# Patient Record
Sex: Female | Born: 1935 | Race: Black or African American | Hispanic: No | Marital: Married | Smoking: Former smoker
Health system: Southern US, Community
[De-identification: ages and names within clinical notes are randomized; demographics above are authoritative.]

## PROBLEM LIST (undated history)

## (undated) DIAGNOSIS — T7840XA Allergy, unspecified, initial encounter: Secondary | ICD-10-CM

## (undated) DIAGNOSIS — E669 Obesity, unspecified: Secondary | ICD-10-CM

## (undated) DIAGNOSIS — M1990 Unspecified osteoarthritis, unspecified site: Secondary | ICD-10-CM

## (undated) DIAGNOSIS — I350 Nonrheumatic aortic (valve) stenosis: Secondary | ICD-10-CM

## (undated) DIAGNOSIS — I10 Essential (primary) hypertension: Secondary | ICD-10-CM

## (undated) DIAGNOSIS — I639 Cerebral infarction, unspecified: Secondary | ICD-10-CM

## (undated) DIAGNOSIS — D649 Anemia, unspecified: Secondary | ICD-10-CM

## (undated) DIAGNOSIS — N189 Chronic kidney disease, unspecified: Secondary | ICD-10-CM

## (undated) DIAGNOSIS — I050 Rheumatic mitral stenosis: Secondary | ICD-10-CM

## (undated) DIAGNOSIS — F419 Anxiety disorder, unspecified: Secondary | ICD-10-CM

## (undated) DIAGNOSIS — I351 Nonrheumatic aortic (valve) insufficiency: Secondary | ICD-10-CM

## (undated) DIAGNOSIS — F0150 Vascular dementia without behavioral disturbance: Secondary | ICD-10-CM

## (undated) DIAGNOSIS — I5189 Other ill-defined heart diseases: Secondary | ICD-10-CM

## (undated) DIAGNOSIS — G4700 Insomnia, unspecified: Secondary | ICD-10-CM

## (undated) HISTORY — PX: ABDOMINAL HYSTERECTOMY: SHX81

## (undated) HISTORY — DX: Other ill-defined heart diseases: I51.89

## (undated) HISTORY — PX: CATARACT EXTRACTION, BILATERAL: SHX1313

## (undated) HISTORY — DX: Nonrheumatic aortic (valve) stenosis: I35.0

## (undated) HISTORY — DX: Insomnia, unspecified: G47.00

## (undated) HISTORY — DX: Nonrheumatic aortic (valve) insufficiency: I35.1

## (undated) HISTORY — DX: Obesity, unspecified: E66.9

## (undated) HISTORY — PX: APPENDECTOMY: SHX54

## (undated) HISTORY — DX: Anemia, unspecified: D64.9

## (undated) HISTORY — PX: EYE SURGERY: SHX253

## (undated) HISTORY — DX: Unspecified osteoarthritis, unspecified site: M19.90

## (undated) HISTORY — DX: Essential (primary) hypertension: I10

## (undated) HISTORY — DX: Anxiety disorder, unspecified: F41.9

## (undated) HISTORY — DX: Allergy, unspecified, initial encounter: T78.40XA

## (undated) HISTORY — DX: Chronic kidney disease, unspecified: N18.9

## (undated) HISTORY — DX: Rheumatic mitral stenosis: I05.0

---

## 1998-12-29 ENCOUNTER — Other Ambulatory Visit: Admission: RE | Admit: 1998-12-29 | Discharge: 1998-12-29 | Payer: Self-pay | Admitting: *Deleted

## 2000-02-02 ENCOUNTER — Encounter: Admission: RE | Admit: 2000-02-02 | Discharge: 2000-02-02 | Payer: Self-pay | Admitting: *Deleted

## 2000-02-02 ENCOUNTER — Encounter: Payer: Self-pay | Admitting: *Deleted

## 2001-02-10 ENCOUNTER — Encounter: Payer: Self-pay | Admitting: Internal Medicine

## 2001-02-10 ENCOUNTER — Encounter: Admission: RE | Admit: 2001-02-10 | Discharge: 2001-02-10 | Payer: Self-pay | Admitting: Internal Medicine

## 2002-02-13 ENCOUNTER — Encounter: Payer: Self-pay | Admitting: Internal Medicine

## 2002-02-13 ENCOUNTER — Encounter: Admission: RE | Admit: 2002-02-13 | Discharge: 2002-02-13 | Payer: Self-pay | Admitting: Internal Medicine

## 2003-02-12 ENCOUNTER — Encounter: Admission: RE | Admit: 2003-02-12 | Discharge: 2003-02-12 | Payer: Self-pay | Admitting: Internal Medicine

## 2003-02-12 ENCOUNTER — Encounter: Payer: Self-pay | Admitting: Internal Medicine

## 2004-02-15 ENCOUNTER — Encounter: Admission: RE | Admit: 2004-02-15 | Discharge: 2004-02-15 | Payer: Self-pay | Admitting: Internal Medicine

## 2004-02-23 ENCOUNTER — Other Ambulatory Visit: Admission: RE | Admit: 2004-02-23 | Discharge: 2004-02-23 | Payer: Self-pay | Admitting: Internal Medicine

## 2005-02-19 ENCOUNTER — Encounter: Admission: RE | Admit: 2005-02-19 | Discharge: 2005-02-19 | Payer: Self-pay | Admitting: Internal Medicine

## 2006-02-27 ENCOUNTER — Encounter: Admission: RE | Admit: 2006-02-27 | Discharge: 2006-02-27 | Payer: Self-pay | Admitting: Internal Medicine

## 2007-03-03 ENCOUNTER — Encounter: Admission: RE | Admit: 2007-03-03 | Discharge: 2007-03-03 | Payer: Self-pay | Admitting: Internal Medicine

## 2007-08-18 ENCOUNTER — Ambulatory Visit: Payer: Self-pay | Admitting: Surgery

## 2008-03-03 ENCOUNTER — Encounter: Admission: RE | Admit: 2008-03-03 | Discharge: 2008-03-03 | Payer: Self-pay | Admitting: Internal Medicine

## 2009-03-04 ENCOUNTER — Encounter: Admission: RE | Admit: 2009-03-04 | Discharge: 2009-03-04 | Payer: Self-pay | Admitting: Internal Medicine

## 2010-03-06 ENCOUNTER — Encounter: Admission: RE | Admit: 2010-03-06 | Discharge: 2010-03-06 | Payer: Self-pay | Admitting: Internal Medicine

## 2011-02-02 ENCOUNTER — Other Ambulatory Visit: Payer: Self-pay | Admitting: Internal Medicine

## 2011-02-02 DIAGNOSIS — Z1239 Encounter for other screening for malignant neoplasm of breast: Secondary | ICD-10-CM

## 2011-03-09 ENCOUNTER — Ambulatory Visit
Admission: RE | Admit: 2011-03-09 | Discharge: 2011-03-09 | Disposition: A | Discharge status: Home / Self Care | Payer: Medicare Other | Source: Ambulatory Visit | Attending: Internal Medicine | Admitting: Internal Medicine

## 2011-03-09 DIAGNOSIS — Z1239 Encounter for other screening for malignant neoplasm of breast: Secondary | ICD-10-CM

## 2011-04-17 NOTE — Procedures (Signed)
DUPLEX DEEP VENOUS EXAM - LOWER EXTREMITY   INDICATION:  Left calf pain   HISTORY:  Edema:  No  Trauma/Surgery:  No  Pain:  Yes  PE:  No  Previous DVT:  No  Anticoagulants:  Other:   DUPLEX EXAM:                CFV   SFV   PopV  PTV    GSV                R  L  R  L  R  L  R   L  R  L  Thrombosis    0  0     0     0      0     0  Spontaneous   +  +     +     +      +     +  Phasic        +  +     +     +      +     +  Augmentation  +  +     +     +      +     +  Compressible  +  +     +     +      +     +  Competent     +  +     +  +  +      +     +   Legend:  + - yes  o - no  p - partial  D - decreased   IMPRESSION:  No evidence of deep venous thrombosis noted in the left  leg.   _____________________________  Janetta Hora. Fields, MD   MG/MEDQ  D:  08/18/2007  T:  08/19/2007  Job:  [id number]

## 2012-03-25 ENCOUNTER — Other Ambulatory Visit: Payer: Self-pay | Admitting: Internal Medicine

## 2012-03-25 DIAGNOSIS — Z1231 Encounter for screening mammogram for malignant neoplasm of breast: Secondary | ICD-10-CM

## 2012-04-03 ENCOUNTER — Ambulatory Visit
Admission: RE | Admit: 2012-04-03 | Discharge: 2012-04-03 | Disposition: A | Discharge status: Home / Self Care | Payer: BC Managed Care – PPO | Source: Ambulatory Visit | Attending: Internal Medicine | Admitting: Internal Medicine

## 2012-04-03 DIAGNOSIS — Z1231 Encounter for screening mammogram for malignant neoplasm of breast: Secondary | ICD-10-CM

## 2012-04-27 ENCOUNTER — Ambulatory Visit: Payer: Medicare Other

## 2012-04-27 ENCOUNTER — Ambulatory Visit (INDEPENDENT_AMBULATORY_CARE_PROVIDER_SITE_OTHER): Payer: Medicare Other | Admitting: Family Medicine

## 2012-04-27 VITALS — BP 129/62 | HR 80 | Temp 98.0°F | Resp 18 | Ht 62.0 in | Wt 175.0 lb

## 2012-04-27 DIAGNOSIS — R609 Edema, unspecified: Secondary | ICD-10-CM

## 2012-04-27 DIAGNOSIS — R0602 Shortness of breath: Secondary | ICD-10-CM

## 2012-04-27 DIAGNOSIS — M069 Rheumatoid arthritis, unspecified: Secondary | ICD-10-CM

## 2012-04-27 DIAGNOSIS — D649 Anemia, unspecified: Secondary | ICD-10-CM

## 2012-04-27 DIAGNOSIS — I509 Heart failure, unspecified: Secondary | ICD-10-CM

## 2012-04-27 LAB — POCT CBC
HCT, POC: 32.3 % — AB (ref 37.7–47.9)
Lymph, poc: 2.2 (ref 0.6–3.4)
MCH, POC: 30.5 pg (ref 27–31.2)
MCV: 96.7 fL (ref 80–97)
MID (cbc): 0.6 (ref 0–0.9)
MPV: 7.9 fL (ref 0–99.8)
RBC: 3.34 M/uL — AB (ref 4.04–5.48)
WBC: 10 10*3/uL (ref 4.6–10.2)

## 2012-04-27 MED ORDER — FUROSEMIDE 40 MG PO TABS: 40.0000 mg | ORAL_TABLET | Freq: Every day | ORAL | Status: DC

## 2012-04-27 MED ORDER — AMOXICILLIN 875 MG PO TABS: 875.0000 mg | ORAL_TABLET | Freq: Two times a day (BID) | ORAL | Status: AC

## 2012-04-27 NOTE — Progress Notes (Signed)
  Subjective: 78 year old lady with a number of health problems on a long list of medications which are available here in the chart. I reviewed those with her. she has been swelling more for over a week. Yesterday she started being more short of breath, as she was today. She has trouble with shortness of breath when she lays down. No chest pain. Does cough some. She lives with her husband and neck and one daughter. She has arthritis.  No fever. Denies chest pain. GI unremarkable GU unremarkable musculoskeletal has the arthritis pains dermatologic unremarkable except for swelling.  Objective: No obvious JVD. Throat clear. TMs normal. Chest had a few crackles deepen the right base. Heart regular without murmur. She has 3+ pitting edema of her ankles and on to her shins she is fully alert and oriented. O2 sat was 91. She is fully alert and oriented. She is accompanied by her daughter.    Results for orders placed in visit on 04/27/12  POCT CBC      Component Value Range   WBC 10.0  4.6 - 10.2 (K/uL)   Lymph, poc 2.2  0.6 - 3.4    POC LYMPH PERCENT 21.6  10 - 50 (%L)   MID (cbc) 0.6  0 - 0.9    POC MID % 5.6  0 - 12 (%M)   POC Granulocyte 7.3 (*) 2 - 6.9    Granulocyte percent 72.8  37 - 80 (%G)   RBC 3.34 (*) 4.04 - 5.48 (M/uL)   Hemoglobin 10.2 (*) 12.2 - 16.2 (g/dL)   HCT, POC 16.1 (*) 09.6 - 47.9 (%)   MCV 96.7  80 - 97 (fL)   MCH, POC 30.5  27 - 31.2 (pg)   MCHC 31.6 (*) 31.8 - 35.4 (g/dL)   RDW, POC 04.5     Platelet Count, POC 268  142 - 424 (K/uL)   MPV 7.9  0 - 99.8 (fL)   [facility] reading (PRIMARY) by  Dr. Alwyn Ren Cardiomegaly and chf  asssessment Chf Cardiomegaly Rheumatoid Anemia Edema    It is my impression that she has CHF, possibly related to her anemia. She seems to be very stable however at this time. I a forgot to do an EKG while she was here. She was Lasix and referred to see a cardiologist this week. She is to go to emergency room if worse. She is to avoid  salt.

## 2012-04-27 NOTE — Patient Instructions (Signed)
Discontinue hydrochlorothiazide Begin Lasix 40 mg daily Avoid excessive salt We will try to make you an appointment with a cardiologist sometime this week. If you do not hear from our office about a referral by Wednesday I would like you to call out here. If more shortness of breath, more swelling, or more ill at any time good to the emergency room Return to see your primary doctor in 1 week.

## 2012-04-29 ENCOUNTER — Telehealth: Payer: Self-pay

## 2012-04-29 LAB — IBC PANEL
%SAT: 12 % — ABNORMAL LOW (ref 20–55)
TIBC: 316 ug/dL (ref 250–470)

## 2012-04-29 LAB — COMPREHENSIVE METABOLIC PANEL
Albumin: 4.1 g/dL (ref 3.5–5.2)
Chloride: 105 mEq/L (ref 96–112)
Sodium: 139 mEq/L (ref 135–145)

## 2012-04-29 LAB — IRON: Iron: 39 ug/dL — ABNORMAL LOW (ref 42–145)

## 2012-04-29 NOTE — Telephone Encounter (Signed)
Kaitey from [facility] office is calling to request records from this pts recent OV and any labs done if any.Odis Luster states that pt is in the office now. ** Informed her about the release of information**

## 2012-04-29 NOTE — Telephone Encounter (Signed)
Faxed over records per patient's request to Dr Venita Sheffield Office at fax#; [phone]

## 2012-04-30 ENCOUNTER — Encounter: Payer: Self-pay | Admitting: Family Medicine

## 2012-05-12 ENCOUNTER — Telehealth: Payer: Self-pay

## 2012-05-12 NOTE — Telephone Encounter (Signed)
Pt needs to go to [facility] to see dr Eula Listen instead of going to [facility] according to [facility] she is a patient of Dr Jonathon Bellows we could get a new order put in please

## 2012-05-16 ENCOUNTER — Other Ambulatory Visit: Payer: Self-pay | Admitting: Family Medicine

## 2012-05-16 ENCOUNTER — Encounter: Payer: Self-pay | Admitting: Family Medicine

## 2012-05-16 NOTE — Telephone Encounter (Signed)
I am uncertain what this is about.  Did we refer her to GI?  I do not see a record of it.

## 2012-06-23 ENCOUNTER — Encounter: Payer: Self-pay | Admitting: Family Medicine

## 2012-08-16 ENCOUNTER — Other Ambulatory Visit: Payer: Self-pay | Admitting: Family Medicine

## 2012-11-07 ENCOUNTER — Other Ambulatory Visit: Payer: Self-pay | Admitting: Physician Assistant

## 2012-11-27 ENCOUNTER — Other Ambulatory Visit: Payer: Self-pay | Admitting: Physician Assistant

## 2013-05-12 ENCOUNTER — Other Ambulatory Visit: Payer: Self-pay

## 2013-05-12 DIAGNOSIS — Z1231 Encounter for screening mammogram for malignant neoplasm of breast: Secondary | ICD-10-CM

## 2013-06-08 ENCOUNTER — Ambulatory Visit
Admission: RE | Admit: 2013-06-08 | Discharge: 2013-06-08 | Disposition: A | Discharge status: Home / Self Care | Payer: Medicare PPO | Source: Ambulatory Visit

## 2013-06-08 DIAGNOSIS — Z1231 Encounter for screening mammogram for malignant neoplasm of breast: Secondary | ICD-10-CM

## 2013-09-03 ENCOUNTER — Other Ambulatory Visit: Payer: Self-pay | Admitting: Internal Medicine

## 2013-09-03 ENCOUNTER — Ambulatory Visit
Admission: RE | Admit: 2013-09-03 | Discharge: 2013-09-03 | Disposition: A | Discharge status: Home / Self Care | Payer: Medicare PPO | Source: Ambulatory Visit | Attending: Internal Medicine | Admitting: Internal Medicine

## 2013-09-03 DIAGNOSIS — R509 Fever, unspecified: Secondary | ICD-10-CM

## 2013-09-03 DIAGNOSIS — R05 Cough: Secondary | ICD-10-CM

## 2013-09-28 ENCOUNTER — Ambulatory Visit (INDEPENDENT_AMBULATORY_CARE_PROVIDER_SITE_OTHER): Payer: Medicare PPO | Admitting: Family Medicine

## 2013-09-28 ENCOUNTER — Telehealth: Payer: Self-pay | Admitting: Cardiology

## 2013-09-28 ENCOUNTER — Other Ambulatory Visit: Payer: Self-pay | Admitting: Family Medicine

## 2013-09-28 VITALS — BP 130/60 | HR 64 | Temp 99.0°F | Resp 16 | Ht 65.0 in | Wt 180.0 lb

## 2013-09-28 DIAGNOSIS — R05 Cough: Secondary | ICD-10-CM

## 2013-09-28 DIAGNOSIS — D649 Anemia, unspecified: Secondary | ICD-10-CM

## 2013-09-28 DIAGNOSIS — I509 Heart failure, unspecified: Secondary | ICD-10-CM

## 2013-09-28 DIAGNOSIS — R0602 Shortness of breath: Secondary | ICD-10-CM

## 2013-09-28 DIAGNOSIS — R799 Abnormal finding of blood chemistry, unspecified: Secondary | ICD-10-CM

## 2013-09-28 DIAGNOSIS — R059 Cough, unspecified: Secondary | ICD-10-CM

## 2013-09-28 DIAGNOSIS — R7989 Other specified abnormal findings of blood chemistry: Secondary | ICD-10-CM

## 2013-09-28 LAB — POCT CBC
Granulocyte percent: 66.8 %G (ref 37–80)
HCT, POC: 31.7 % — AB (ref 37.7–47.9)
Hemoglobin: 9.8 g/dL — AB (ref 12.2–16.2)
Lymph, poc: 3.2 (ref 0.6–3.4)
MCH, POC: 33.8 pg — AB (ref 27–31.2)
MCHC: 30.9 g/dL — AB (ref 31.8–35.4)
MCV: 109.2 fL — AB (ref 80–97)
MID (cbc): 0.7 (ref 0–0.9)
MPV: 7.6 fL (ref 0–99.8)
POC Granulocyte: 7.9 — AB (ref 2–6.9)
POC LYMPH PERCENT: 27 %L (ref 10–50)
POC MID %: 6.2 %M (ref 0–12)
Platelet Count, POC: 220 10*3/uL (ref 142–424)
RBC: 2.9 M/uL — AB (ref 4.04–5.48)
RDW, POC: 16.8 %
WBC: 11.8 10*3/uL — AB (ref 4.6–10.2)

## 2013-09-28 LAB — COMPREHENSIVE METABOLIC PANEL
ALT: 26 U/L (ref 0–35)
AST: 24 U/L (ref 0–37)
Albumin: 3.8 g/dL (ref 3.5–5.2)
Alkaline Phosphatase: 41 U/L (ref 39–117)
BUN: 12 mg/dL (ref 6–23)
CO2: 32 mEq/L (ref 19–32)
Calcium: 9.4 mg/dL (ref 8.4–10.5)
Chloride: 100 mEq/L (ref 96–112)
Creat: 1.14 mg/dL — ABNORMAL HIGH (ref 0.50–1.10)
Glucose, Bld: 110 mg/dL — ABNORMAL HIGH (ref 70–99)
Potassium: 3.6 mEq/L (ref 3.5–5.3)
Sodium: 142 mEq/L (ref 135–145)
Total Bilirubin: 0.7 mg/dL (ref 0.3–1.2)
Total Protein: 6.8 g/dL (ref 6.0–8.3)

## 2013-09-28 MED ORDER — METOLAZONE 5 MG PO TABS: 5.0000 mg | ORAL_TABLET | Freq: Every day | ORAL | Status: DC

## 2013-09-28 NOTE — Progress Notes (Signed)
Patient ID: KHAYLEE MCEVOY MRN: [id number], DOB: [birth year], 79 y.o. Date of Encounter: 09/28/2013, 11:54 AM  Primary Physician: No primary provider on file.  Chief Complaint:  Chief Complaint  Patient presents with  . Cough    HPI: 79 y.o. year old female presents with a 28 day history of nasal congestion, post nasal drip, sore throat, and cough. Mild sinus pressure. Afebrile. No chills. Nasal congestion thick and green/yellow. Cough is productive of green/yellow sputum and not associated with time of day. Ears feel full, leading to sensation of muffled hearing. Has tried OTC cold preps without success. Coughs to the point of gagging.  Symptoms worse at night.  No appetite.  No sick contacts, recent antibiotics, or recent travels.   No leg trauma, sedentary periods, h/o cancer, or tobacco use.  Patient has h/o CHF as well.  No ankle swelling.  No asthma hx.    Past Medical History  Diagnosis Date  . Allergy   . Anemia   . CHF (congestive heart failure)      Home Meds: Prior to Admission medications   Medication Sig Start Date End Date Taking? Authorizing Provider  ALPRAZolam (XANAX) 0.25 MG tablet Take 0.25 mg by mouth 2 (two) times daily as needed.   Yes Historical Provider, MD  aspirin 81 MG chewable tablet Chew 81 mg by mouth daily.   Yes Historical Provider, MD  calcium gluconate 500 MG tablet Take 500 mg by mouth 2 (two) times daily.   Yes Historical Provider, MD  cholecalciferol (VITAMIN D) 1000 UNITS tablet Take 1,000 Units by mouth daily.   Yes Historical Provider, MD  ferrous sulfate 325 (65 FE) MG tablet Take 325 mg by mouth daily with breakfast.   Yes Historical Provider, MD  folic acid (FOLVITE) 1 MG tablet Take 1 mg by mouth daily.   Yes Historical Provider, MD  mometasone-formoterol (DULERA) 100-5 MCG/ACT AERO Inhale 2 puffs into the lungs.   Yes Historical Provider, MD  moxifloxacin (AVELOX) 400 MG tablet Take 400 mg by mouth daily.   Yes Historical  Provider, MD  predniSONE (DELTASONE) 5 MG tablet Take 5 mg by mouth daily. Takes 3 tablets daily   Yes Historical Provider, MD  traMADol (ULTRAM) 50 MG tablet Take 50 mg by mouth 3 (three) times daily as needed.   Yes Historical Provider, MD  verapamil (COVERA HS) 240 MG (CO) 24 hr tablet Take 240 mg by mouth at bedtime.   Yes Historical Provider, MD  doxazosin (CARDURA) 8 MG tablet Take 8 mg by mouth at bedtime.    Historical Provider, MD  furosemide (LASIX) 40 MG tablet TAKE 1 TABLET BY MOUTH EVERY DAY NEEDS OFFICE VISIT 11/27/12   Anders Simmonds, PA-C  hydrochlorothiazide (HYDRODIURIL) 25 MG tablet Take 25 mg by mouth daily.    Historical Provider, MD  loratadine (CLARITIN) 10 MG tablet Take 10 mg by mouth as needed.    Historical Provider, MD  methotrexate (RHEUMATREX) 2.5 MG tablet Take 2.5 mg by mouth once a week. Caution:Chemotherapy. Protect from light.    Historical Provider, MD  metoprolol tartrate (LOPRESSOR) 25 MG tablet Take 25 mg by mouth 2 (two) times daily.    Historical Provider, MD  Polyethyl Glycol-Propyl Glycol (SYSTANE) 0.4-0.3 % SOLN Apply to eye.    Historical Provider, MD  potassium chloride (K-DUR,KLOR-CON) 10 MEQ tablet Take 10 mEq by mouth daily.    Historical Provider, MD    Allergies: No Known Allergies  History   Social  History  . Marital Status: Married    Spouse Name: N/A    Number of Children: N/A  . Years of Education: N/A   Occupational History  . Not on file.   Social History Main Topics  . Smoking status: Never Smoker   . Smokeless tobacco: Not on file  . Alcohol Use: Not on file  . Drug Use: Not on file  . Sexual Activity: Not on file   Other Topics Concern  . Not on file   Social History Narrative  . No narrative on file     Review of Systems: Constitutional: negative for chills, fever, night sweats or weight changes Cardiovascular: negative for chest pain or palpitations Respiratory: negative for hemoptysis, wheezing, or shortness  of breath Abdominal: negative for abdominal pain, nausea, vomiting or diarrhea Dermatological: negative for rash Neurologic: negative for headache   Physical Exam: Blood pressure 130/60, pulse 64, temperature 99 F (37.2 C), temperature source Oral, resp. rate 16, height 5\' 5"  (1.651 m), weight 180 lb (81.647 kg), SpO2 94.00%., Body mass index is 29.95 kg/(m^2). General: Well developed, well nourished, in no acute distress. Head: Normocephalic, atraumatic, eyes without discharge, sclera non-icteric, nares are congested. Bilateral auditory canals clear, TM's are without perforation, pearly grey with reflective cone of light bilaterally. No sinus TTP. Oral cavity moist, dentition normal. Posterior pharynx with post nasal drip and mild erythema. No peritonsillar abscess or tonsillar exudate. Neck: Supple. No thyromegaly. Full ROM. No lymphadenopathy. Lungs: Coarse breath sounds bilaterally without wheezes, rales, or rhonchi. Breathing is unlabored.  Heart: RRR with S1 S2. No murmurs, rubs, or gallops appreciated. Msk:  Strength and tone normal for age. Extremities: No clubbing or cyanosis. No edema. Neuro: Alert and oriented X 3. Moves all extremities spontaneously. CNII-XII grossly in tact. Psych:  Responds to questions appropriately with a normal affect.    ASSESSMENT AND PLAN:  79 y.o. year old female withCHF CHF (congestive heart failure) - Plan: metolazone (ZAROXOLYN) 5 MG tablet, POCT CBC, Ambulatory referral to Cardiology  Elevated brain natriuretic peptide (BNP) level   - -Mucinex -Tylenol/Motrin prn -Rest/fluids -RTC precautions -RTC 3-5 days if no improvement  Signed, Elvina Sidle, MD 09/28/2013 11:54 AM

## 2013-09-28 NOTE — Telephone Encounter (Signed)
Per daughter - pt has been sick for some time now (approximatley since 10/1)  She has been to see her PCP who Dx her with bronchitis and daughter reports she was started on an antibiotic then.  10/12 pt was Dx with Pneumonia - per daughter she was "yellow" looking and could barely walk she was so weak.  She was not admitted to the hospital.  Saturday night pt became very SOB.  EMS came to the house but pt refused to go to the hospital for further evaluation.  They took her today to [facility].  She was Dx with possible CHF and started on Metolazone.  Lab work was also drawn including BNP however those results are not back yet.  Advised daughter to continue with plan of care by Urgent Care and I will forward this information to Dr Mayford Knife for review.  Daughter aware we will call back with further instructions.

## 2013-09-28 NOTE — Addendum Note (Signed)
Addended by: Johnnette Litter on: 09/28/2013 02:55 PM   Modules accepted: Orders

## 2013-09-28 NOTE — Telephone Encounter (Signed)
Please get results of BNP for my review from Urgent Care

## 2013-09-28 NOTE — Telephone Encounter (Signed)
New Problem:  Pt's daughter states she has some questions and concerns for the doctor or nurse. Pt's daughter also states her mom has been diagnosed with pneumonia and bronchitis since the beginning of October. Pt's daughter states her mom just doesn't look good. Please advise

## 2013-09-29 ENCOUNTER — Other Ambulatory Visit: Payer: Self-pay | Admitting: Family Medicine

## 2013-09-29 DIAGNOSIS — D649 Anemia, unspecified: Secondary | ICD-10-CM

## 2013-09-29 LAB — BRAIN NATRIURETIC PEPTIDE: Brain Natriuretic Peptide: 403.4 pg/mL — ABNORMAL HIGH (ref 0.0–100.0)

## 2013-09-29 LAB — FERRITIN: Ferritin: 305 ng/mL — ABNORMAL HIGH (ref 10–291)

## 2013-09-29 NOTE — Telephone Encounter (Signed)
Follow Up:  Pt's daughter states she is calling back just to follow up and see when someone is going to call her back. Please advise

## 2013-09-29 NOTE — Telephone Encounter (Signed)
Called daughter and explained Dr. Mayford Knife was not yet in office and that once Dr. Mayford Knife reviewed the Urgent care ov I would call her back and advise on what Dr. Mayford Knife would like to do.

## 2013-09-30 ENCOUNTER — Telehealth: Payer: Self-pay

## 2013-09-30 ENCOUNTER — Encounter: Payer: Self-pay | Admitting: *Deleted

## 2013-09-30 DIAGNOSIS — D649 Anemia, unspecified: Secondary | ICD-10-CM | POA: Insufficient documentation

## 2013-09-30 DIAGNOSIS — G4700 Insomnia, unspecified: Secondary | ICD-10-CM | POA: Insufficient documentation

## 2013-09-30 DIAGNOSIS — M1990 Unspecified osteoarthritis, unspecified site: Secondary | ICD-10-CM | POA: Insufficient documentation

## 2013-09-30 DIAGNOSIS — I350 Nonrheumatic aortic (valve) stenosis: Secondary | ICD-10-CM | POA: Insufficient documentation

## 2013-09-30 DIAGNOSIS — I050 Rheumatic mitral stenosis: Secondary | ICD-10-CM | POA: Insufficient documentation

## 2013-09-30 DIAGNOSIS — N189 Chronic kidney disease, unspecified: Secondary | ICD-10-CM | POA: Insufficient documentation

## 2013-09-30 DIAGNOSIS — T7840XA Allergy, unspecified, initial encounter: Secondary | ICD-10-CM | POA: Insufficient documentation

## 2013-09-30 DIAGNOSIS — F419 Anxiety disorder, unspecified: Secondary | ICD-10-CM | POA: Insufficient documentation

## 2013-09-30 DIAGNOSIS — I351 Nonrheumatic aortic (valve) insufficiency: Secondary | ICD-10-CM | POA: Insufficient documentation

## 2013-09-30 DIAGNOSIS — E669 Obesity, unspecified: Secondary | ICD-10-CM | POA: Insufficient documentation

## 2013-09-30 NOTE — Telephone Encounter (Signed)
Pt notified of labs. Pt's cough has not improved at all, but she sees the [employer].

## 2013-09-30 NOTE — Telephone Encounter (Signed)
Message copied by Johnnette Litter on Wed Sep 30, 2013  8:03 PM ------      Message from: Elvina Sidle      Created: Wed Sep 30, 2013 10:23 AM       Patient has abnormal lab values.  Good iron levels, so no need for iron supplementation now.  I have requested a hematology consultation.  The labs do confirm congestive heart failure and I am expecting improvement in cough and shortness of breath by now.  Please let me know ------

## 2013-10-01 ENCOUNTER — Ambulatory Visit (HOSPITAL_COMMUNITY)
Admission: RE | Admit: 2013-10-01 | Discharge: 2013-10-01 | Disposition: A | Discharge status: Home / Self Care | Payer: Medicare PPO | Source: Ambulatory Visit | Attending: Cardiology | Admitting: Cardiology

## 2013-10-01 ENCOUNTER — Encounter: Payer: Self-pay | Admitting: Cardiology

## 2013-10-01 ENCOUNTER — Ambulatory Visit (INDEPENDENT_AMBULATORY_CARE_PROVIDER_SITE_OTHER): Payer: Medicare PPO | Admitting: Cardiology

## 2013-10-01 VITALS — BP 116/50 | HR 70 | Ht 65.0 in | Wt 176.8 lb

## 2013-10-01 DIAGNOSIS — I509 Heart failure, unspecified: Secondary | ICD-10-CM | POA: Insufficient documentation

## 2013-10-01 DIAGNOSIS — R05 Cough: Secondary | ICD-10-CM

## 2013-10-01 DIAGNOSIS — I519 Heart disease, unspecified: Secondary | ICD-10-CM

## 2013-10-01 DIAGNOSIS — I359 Nonrheumatic aortic valve disorder, unspecified: Secondary | ICD-10-CM

## 2013-10-01 DIAGNOSIS — I351 Nonrheumatic aortic (valve) insufficiency: Secondary | ICD-10-CM

## 2013-10-01 DIAGNOSIS — I050 Rheumatic mitral stenosis: Secondary | ICD-10-CM

## 2013-10-01 DIAGNOSIS — R059 Cough, unspecified: Secondary | ICD-10-CM | POA: Insufficient documentation

## 2013-10-01 DIAGNOSIS — I350 Nonrheumatic aortic (valve) stenosis: Secondary | ICD-10-CM

## 2013-10-01 DIAGNOSIS — I5189 Other ill-defined heart diseases: Secondary | ICD-10-CM

## 2013-10-01 DIAGNOSIS — I5032 Chronic diastolic (congestive) heart failure: Secondary | ICD-10-CM

## 2013-10-01 NOTE — Progress Notes (Addendum)
[address] [place], [place]  [address] Phone: [phone] Fax:  [phone]  Date:  10/01/2013   ID:  Monica Blankenship, DOB 12/25/1928, MRN [id number]  PCP:  No primary provider on file.  Cardiologist:  Armanda Magic, MD     History of Present Illness: Monica Blankenship is a 79 y.o. female  with a history of HTN, diastolic dysfunction and mild MS, moderate MR, mild AS and mild to moderate AI and mild pulmonary HTN.  She has not been doing very well.  She has been to see Dr. Donette Larry several times now for acute bronchitis and was treated with antibiotics and then got worse and was diagnosed with PNA on 10/12 and was placed on antibiotics again and then saw Dr. Donette Larry again on 10/22 and was diagnosed with antibiotics again.  She continues to have a cough productive of white mucous.  She denies any chest pain.  She denies any SOB.  She denies any LE edema but her daughter says that she had some LE edema when seen at urgent care and a BNP was mildly elevated and she was started on Metolazone in addition to continuing her Lasix.  Since then her LE edema has resolved.  She has urinated a lot after that.  She says that her cough is worse at night and she coughs up very thick mucous.     Wt Readings from Last 3 Encounters:  10/01/13 176 lb 12.8 oz (80.196 kg)  09/28/13 180 lb (81.647 kg)  04/27/12 175 lb (79.379 kg)     Past Medical History  Diagnosis Date  . Allergy   . CHF (congestive heart failure)   . HTN (hypertension)   . Anxiety   . Insomnia   . Osteoarthritis   . Anemia   . Fatigue   . Obesity   . Diastolic dysfunction   . CKD (chronic kidney disease)   . Mitral stenosis   . Aortic stenosis   . Aortic regurgitation       Allergies:   No Known Allergies  Social History:  The patient  reports that she has never smoked. She does not have any smokeless tobacco history on file.   Family History:  The patient's family history is not on file.   ROS:  Please see  the history of present illness.      All other systems reviewed and negative.   PHYSICAL EXAM: VS:  BP 116/50  Pulse 70  Ht 5\' 5"  (1.651 m)  Wt 176 lb 12.8 oz (80.196 kg)  BMI 29.42 kg/m2 Well nourished, well developed, in no acute distress HEENT: normal Neck: no JVD Cardiac:  normal S1, S2; RRR; no murmur Lungs:  ronchi at bases otherwise clear Abd: soft, nontender, no hepatomegaly Ext: no edema Skin: warm and dry Neuro:  CNs 2-12 intact, no focal abnormalities noted   EKG:  NSR with incomplete LBBB    ASSESSMENT AND PLAN:  1. Cough productive of thick mucous after treatment several times for acute bronchitis and PNA.  She continues to have a chronic cough without fever olr chills that is worse at night bringing up possibility of chronic GERD.  Her cough may be worsened by diastolic CHF but has not resolved on diuretics.  - refer to Pulmonary Medicine  - PA and lat Chest xray today 2. Acute on chronic diastolic CHF   - I will recheck a BNP today to see if it has improved  - stop Metalozone  for now and continue Lasix  - check BMET  - recheck 2D echo to assess LVF  - stop HCTZ At this time her O2 sats on RA are 92% and decrease to 90% by ambulation.  She is not febrile and does not appear ill enough to admit to [facility].  Her main complaint is chronic cough and she is not SOB. 3.  Mild MS with Moderate MR by echo 03/2013 4.  Mild AS with mild to moderate AI by echo 03/2013 5.  HTN   - continue doxazosin/metoprolol/Verapamil  Followup with me on 11/19     Signed, Armanda Magic, MD 10/01/2013 3:37 PM

## 2013-10-01 NOTE — Patient Instructions (Addendum)
Stop Metalazone Stop HCTZ  Your physician recommends that have lab work today after your office visit to check you BMET and BNP level  Your physician has requested that you have an echocardiogram. Echocardiography is a painless test that uses sound waves to create images of your heart. It provides your doctor with information about the size and shape of your heart and how well your heart's chambers and valves are working. This procedure takes approximately one hour. There are no restrictions for this procedure.  A chest x-ray takes a picture of the organs and structures inside the chest, including the heart, lungs, and blood vessels. This test can show several things, including, whether the heart is enlarges; whether fluid is building up in the lungs; and whether pacemaker / defibrillator leads are still in place. STAT   You are scheduled to see Dr Mayford Knife again on 10/21/13

## 2013-10-01 NOTE — Progress Notes (Signed)
Call pts daughter at [phone] to let her know

## 2013-10-02 ENCOUNTER — Telehealth: Payer: Self-pay | Admitting: General Surgery

## 2013-10-02 DIAGNOSIS — R0602 Shortness of breath: Secondary | ICD-10-CM

## 2013-10-02 LAB — CBC
HCT: 36 % (ref 36.0–46.0)
Hemoglobin: 11.9 g/dL — ABNORMAL LOW (ref 12.0–15.0)
RBC: 3.49 Mil/uL — ABNORMAL LOW (ref 3.87–5.11)
RDW: 17 % — ABNORMAL HIGH (ref 11.5–14.6)
WBC: 7.1 10*3/uL (ref 4.5–10.5)

## 2013-10-02 LAB — BASIC METABOLIC PANEL
BUN: 31 mg/dL — ABNORMAL HIGH (ref 6–23)
CO2: 35 mEq/L — ABNORMAL HIGH (ref 19–32)
Chloride: 89 mEq/L — ABNORMAL LOW (ref 96–112)
Potassium: 3.1 mEq/L — ABNORMAL LOW (ref 3.5–5.1)

## 2013-10-02 LAB — BRAIN NATRIURETIC PEPTIDE: Pro B Natriuretic peptide (BNP): 129 pg/mL — ABNORMAL HIGH (ref 0.0–100.0)

## 2013-10-02 NOTE — Telephone Encounter (Signed)
Patient's daughter is returning your call 

## 2013-10-02 NOTE — Telephone Encounter (Signed)
Message copied by Nita Sells on Fri Oct 02, 2013  1:06 PM ------      Message from: Armanda Magic R      Created: Fri Oct 02, 2013 11:06 AM       Please let patient's daughter know that her BNP has significantly improved and chest xray showed no evidence of CHF.  She had rhonchi on exam yesterday that are most likely due to recent bronchitis.  Her kidney function has worsened due to Metolazone.  Please have her hold Lasix for 2 days and then resume Lasix at 20mg  daily.  Please get her an appt to see Dr. Donette Larry today and make sure she has an upcoming appt with Pulmonary.  Her potassium is low.  Please have her take Klor Con 4 tablets now and then restart 1 tablet daily in 2 days when she restarts her Lasix.  Recheck BMET on 11/3 ------

## 2013-10-02 NOTE — Telephone Encounter (Signed)
Dhe was supposed to be referred to Pulmonary at her OV with me on 10/30

## 2013-10-02 NOTE — Telephone Encounter (Signed)
Pt is aware of the med changes. Pt is seeing Dr. Donette Larry on Monday at 3:00. I called and asked that they add on a BMET for [place] so the pt would not have to go to two different offices that day. She does not have a pulmonologist at this time.

## 2013-10-02 NOTE — Telephone Encounter (Signed)
Message copied by Nita Sells on Fri Oct 02, 2013  1:21 PM ------      Message from: Armanda Magic R      Created: Fri Oct 02, 2013 11:06 AM       Please let patient's daughter know that her BNP has significantly improved and chest xray showed no evidence of CHF.  She had rhonchi on exam yesterday that are most likely due to recent bronchitis.  Her kidney function has worsened due to Metolazone.  Please have her hold Lasix for 2 days and then resume Lasix at 20mg  daily.  Please get her an appt to see Dr. Donette Larry today and make sure she has an upcoming appt with Pulmonary.  Her potassium is low.  Please have her take Klor Con 4 tablets now and then restart 1 tablet daily in 2 days when she restarts her Lasix.  Recheck BMET on 11/3 ------

## 2013-10-02 NOTE — Telephone Encounter (Signed)
LVM for pt to return call

## 2013-10-05 NOTE — Telephone Encounter (Signed)
I looked thru you OV and my notes and did not see a referral needed. I will do that this morning for you. Do you have a preference for pt to see?

## 2013-10-05 NOTE — Telephone Encounter (Signed)
No preference

## 2013-10-05 NOTE — Telephone Encounter (Signed)
Referal sent to Pulmonology

## 2013-10-05 NOTE — Addendum Note (Signed)
Addended by: Nita Sells on: 10/05/2013 02:22 PM   Modules accepted: Orders

## 2013-10-06 ENCOUNTER — Telehealth: Payer: Self-pay | Admitting: Critical Care Medicine

## 2013-10-08 ENCOUNTER — Other Ambulatory Visit: Payer: Self-pay

## 2013-10-14 ENCOUNTER — Other Ambulatory Visit: Payer: Self-pay | Admitting: Nurse Practitioner

## 2013-10-14 ENCOUNTER — Ambulatory Visit
Admission: RE | Admit: 2013-10-14 | Discharge: 2013-10-14 | Disposition: A | Discharge status: Home / Self Care | Payer: Medicare PPO | Source: Ambulatory Visit | Attending: Nurse Practitioner | Admitting: Nurse Practitioner

## 2013-10-14 DIAGNOSIS — J209 Acute bronchitis, unspecified: Secondary | ICD-10-CM

## 2013-10-15 ENCOUNTER — Ambulatory Visit (HOSPITAL_COMMUNITY): Payer: Medicare PPO | Attending: Cardiology | Admitting: Radiology

## 2013-10-15 ENCOUNTER — Encounter: Payer: Self-pay | Admitting: Cardiology

## 2013-10-15 DIAGNOSIS — R05 Cough: Secondary | ICD-10-CM | POA: Insufficient documentation

## 2013-10-15 DIAGNOSIS — I079 Rheumatic tricuspid valve disease, unspecified: Secondary | ICD-10-CM | POA: Insufficient documentation

## 2013-10-15 DIAGNOSIS — I080 Rheumatic disorders of both mitral and aortic valves: Secondary | ICD-10-CM | POA: Insufficient documentation

## 2013-10-15 DIAGNOSIS — I10 Essential (primary) hypertension: Secondary | ICD-10-CM | POA: Insufficient documentation

## 2013-10-15 DIAGNOSIS — R059 Cough, unspecified: Secondary | ICD-10-CM | POA: Insufficient documentation

## 2013-10-15 DIAGNOSIS — I5032 Chronic diastolic (congestive) heart failure: Secondary | ICD-10-CM

## 2013-10-15 DIAGNOSIS — I509 Heart failure, unspecified: Secondary | ICD-10-CM | POA: Insufficient documentation

## 2013-10-15 NOTE — Progress Notes (Signed)
Echocardiogram performed.  

## 2013-10-15 NOTE — Telephone Encounter (Signed)
What labs?

## 2013-10-15 NOTE — Telephone Encounter (Signed)
To Dr. Mayford Knife. As soon as labs arrive will give to you.

## 2013-10-15 NOTE — Telephone Encounter (Signed)
New problem    Lab work will be faxing over. Please advise

## 2013-10-16 NOTE — Telephone Encounter (Signed)
Basic Metabolic Panel on your desk for review

## 2013-10-16 NOTE — Telephone Encounter (Signed)
BMET stable

## 2013-10-16 NOTE — Telephone Encounter (Signed)
Called Dr. Levonne Lapping and let her know Dr. Mayford Knife thought the BMET lab was stable and she could call us back if she had any questions.

## 2013-10-21 ENCOUNTER — Ambulatory Visit (INDEPENDENT_AMBULATORY_CARE_PROVIDER_SITE_OTHER): Payer: Medicare PPO | Admitting: Cardiology

## 2013-10-21 ENCOUNTER — Encounter: Payer: Self-pay | Admitting: Cardiology

## 2013-10-21 VITALS — BP 140/80 | HR 79 | Ht 65.0 in | Wt 186.0 lb

## 2013-10-21 DIAGNOSIS — I351 Nonrheumatic aortic (valve) insufficiency: Secondary | ICD-10-CM

## 2013-10-21 DIAGNOSIS — I2789 Other specified pulmonary heart diseases: Secondary | ICD-10-CM

## 2013-10-21 DIAGNOSIS — I359 Nonrheumatic aortic valve disorder, unspecified: Secondary | ICD-10-CM

## 2013-10-21 DIAGNOSIS — I5032 Chronic diastolic (congestive) heart failure: Secondary | ICD-10-CM

## 2013-10-21 DIAGNOSIS — I509 Heart failure, unspecified: Secondary | ICD-10-CM

## 2013-10-21 DIAGNOSIS — I272 Pulmonary hypertension, unspecified: Secondary | ICD-10-CM | POA: Insufficient documentation

## 2013-10-21 DIAGNOSIS — I10 Essential (primary) hypertension: Secondary | ICD-10-CM

## 2013-10-21 DIAGNOSIS — I050 Rheumatic mitral stenosis: Secondary | ICD-10-CM

## 2013-10-21 DIAGNOSIS — I350 Nonrheumatic aortic (valve) stenosis: Secondary | ICD-10-CM

## 2013-10-21 MED ORDER — FUROSEMIDE 40 MG PO TABS: 20.0000 mg | ORAL_TABLET | Freq: Every day | ORAL | Status: DC

## 2013-10-21 MED ORDER — METOPROLOL TARTRATE 25 MG PO TABS: 25.0000 mg | ORAL_TABLET | Freq: Two times a day (BID) | ORAL | Status: DC

## 2013-10-21 NOTE — Progress Notes (Addendum)
[address] [place], [place]  [address] Phone: [phone] Fax:  [phone]  Date:  10/22/2013   ID:  Monica Blankenship, DOB 02-24-1929, MRN [id number]  PCP:  No primary provider on file.  Cardiologist:  Armanda Magic, MD     History of Present Illness: Monica Blankenship is a 79 y.o. female with a history of HTN, diastolic dysfunction and mild MS, moderate MR, mild AS and mild to moderate AI and mild pulmonary HTN. She has not been doing very well. She has been to see Dr. Donette Larry several times now for acute bronchitis and was treated with antibiotics and then got worse and was diagnosed with PNA on 10/12 and was placed on antibiotics again and then saw Dr. Donette Larry again on 10/22 and was diagnosed with antibiotics again. She continued to have a cough productive of white mucous. She denies any chest pain. She denies any SOB. She denies any LE edema but her daughter says that she had some LE edema when seen at urgent care and a BNP was mildly elevated and she was started on Metolazone in addition to continuing her Lasix. Since then her LE edema has resolved. She has urinated a lot after that. She says that her cough is worse at night and she coughs up very thick mucous. When I saw her last I ordered a BNP which was much improved and essentially normal and chest xray showed no edema.  Her creatinine had increased and her Metalozone and HCTZ were stopped.  Repeat echo was unchanged from April except for worsening of her pulmonary HTN.  At that time I felt her symptoms were more related to recent URIs and recommended followup with Dr. Donette Larry.  He placed her on a steroid taper and an inhaler and she is now significantly better.     Wt Readings from Last 3 Encounters:  10/21/13 186 lb (84.369 kg)  10/01/13 176 lb 12.8 oz (80.196 kg)  09/28/13 180 lb (81.647 kg)     Past Medical History  Diagnosis Date  . Allergy   . CHF (congestive heart failure)   . Anxiety   . Insomnia   .  Osteoarthritis   . Anemia   . Fatigue   . Obesity   . Diastolic dysfunction   . CKD (chronic kidney disease)   . Mitral stenosis   . Aortic stenosis   . Aortic regurgitation   . HTN (hypertension)     Current Outpatient Prescriptions  Medication Sig Dispense Refill  . ALPRAZolam (XANAX) 0.25 MG tablet Take 0.25 mg by mouth 2 (two) times daily as needed.      Marland Kitchen aspirin 81 MG chewable tablet Chew 81 mg by mouth daily.      . calcium gluconate 500 MG tablet Take 500 mg by mouth 2 (two) times daily.      . cholecalciferol (VITAMIN D) 1000 UNITS tablet Take 1,000 Units by mouth daily.      Marland Kitchen doxazosin (CARDURA) 8 MG tablet Take 8 mg by mouth at bedtime.      . ferrous sulfate 325 (65 FE) MG tablet Take 325 mg by mouth daily with breakfast.      . folic acid (FOLVITE) 1 MG tablet Take 1 mg by mouth daily.      . furosemide (LASIX) 40 MG tablet TAKE 1 TABLET BY MOUTH EVERY DAY   15 tablet  0  . loratadine (CLARITIN) 10 MG tablet Take 10 mg by mouth as needed.      Marland Kitchen  methotrexate (RHEUMATREX) 2.5 MG tablet Take 2.5 mg by mouth once a week. Caution:Chemotherapy. Protect from light.      . metoprolol tartrate (LOPRESSOR) 25 MG tablet Take 25 mg by mouth 2 (two) times daily.      . mometasone-formoterol (DULERA) 100-5 MCG/ACT AERO Inhale 2 puffs into the lungs.      . moxifloxacin (AVELOX) 400 MG tablet Take 400 mg by mouth daily.      Monica Gala Glycol-Propyl Glycol (SYSTANE) 0.4-0.3 % SOLN Apply to eye.      . potassium chloride (K-DUR,KLOR-CON) 10 MEQ tablet Take 10 mEq by mouth daily.      . predniSONE (DELTASONE) 5 MG tablet Take 7.5 mg by mouth. Take 1.5 tablets      . traMADol (ULTRAM) 50 MG tablet Take 50 mg by mouth 3 (three) times daily as needed.      . verapamil (COVERA HS) 240 MG (CO) 24 hr tablet Take 240 mg by mouth at bedtime.       No current facility-administered medications for this visit.    Allergies:   No Known Allergies  Social History:  The patient  reports that  she has never smoked. She does not have any smokeless tobacco history on file. She reports that she does not drink alcohol or use illicit drugs.   Family History:  The patient's family history is not on file.   ROS:  Please see the history of present illness.      All other systems reviewed and negative.   PHYSICAL EXAM: VS:  BP 140/80  Pulse 79  Ht 5\' 5"  (1.651 m)  Wt 186 lb (84.369 kg)  BMI 30.95 kg/m2 Well nourished, well developed, in no acute distress HEENT: normal Neck: no JVD Cardiac:  normal S1, S2; RRR; no murmur Lungs:  clear to auscultation bilaterally, no wheezing, rhonchi or rales Abd: soft, nontender, no hepatomegaly Ext: 1+ edema Skin: warm and dry Neuro:  CNs 2-12 intact, no focal abnormalities noted  ASSESSMENT AND PLAN:  1.  Cough productive of thick mucous after treatment several times for acute bronchitis and PNA.  Now significantly improved after steroids and inhaler but continues to have a daily cough but not as bad.  She still has SOB with movement. 2.   Acute on chronic diastolic CHF - now resolved although she still has some degree of LE edema which is most likely due to her severe pulmonary HTN  - she never restarted her Lasix  - restart Lasix 40mg  daily 1/2 tablet daily  - continue potassium  - recheck BMET in 1 week 3. Mild MS with Moderate MR by echo 03/2013  4. Mild AS with mild to moderate AI by echo 03/2013  5. HTN   -continue doxazosin/Verapamil  - restart metoprolol 25mg  BID - she stopped this but no one has told her to and her daughters were confused about the medication 6.  Moderate to severe pulmonary HTN  (PASP ) most likely secondary to underlying COPD  Followup with me in 3 months   Signed, Armanda Magic, MD 10/22/2013 10:02 PM

## 2013-10-21 NOTE — Patient Instructions (Signed)
Your physician has recommended you make the following change in your medication: Restart Metoprolol 25 MG Twice a day and Restart Lasix 40 MG 1/2 tablet Daily  Your physician recommends that you return for lab work in: One week for a Basic Metabolic Panel. This will be drawn on Weds the 26th. Our lab opens at 8:00 AM.  Your physician recommends that you schedule a follow-up appointment in: 3 Months with Dr. Mayford Knife.

## 2013-10-28 ENCOUNTER — Other Ambulatory Visit (INDEPENDENT_AMBULATORY_CARE_PROVIDER_SITE_OTHER): Payer: Medicare PPO

## 2013-10-28 ENCOUNTER — Other Ambulatory Visit: Payer: Self-pay | Admitting: *Deleted

## 2013-10-28 DIAGNOSIS — I5032 Chronic diastolic (congestive) heart failure: Secondary | ICD-10-CM

## 2013-10-28 DIAGNOSIS — E876 Hypokalemia: Secondary | ICD-10-CM

## 2013-10-28 DIAGNOSIS — I509 Heart failure, unspecified: Secondary | ICD-10-CM

## 2013-10-28 LAB — BASIC METABOLIC PANEL
BUN: 17 mg/dL (ref 6–23)
Calcium: 8.9 mg/dL (ref 8.4–10.5)
GFR: 61.15 mL/min (ref >60.00)
Glucose, Bld: 132 mg/dL — ABNORMAL HIGH (ref 70–99)
Potassium: 2.5 mEq/L — CL (ref 3.5–5.1)
Sodium: 141 mEq/L (ref 135–145)

## 2013-10-28 MED ORDER — POTASSIUM CHLORIDE CRYS ER 20 MEQ PO TBCR: EXTENDED_RELEASE_TABLET | ORAL | Status: DC

## 2013-11-02 ENCOUNTER — Other Ambulatory Visit (INDEPENDENT_AMBULATORY_CARE_PROVIDER_SITE_OTHER): Payer: Medicare PPO

## 2013-11-02 DIAGNOSIS — E876 Hypokalemia: Secondary | ICD-10-CM

## 2013-11-02 LAB — BASIC METABOLIC PANEL
CO2: 28 mEq/L (ref 19–32)
Calcium: 9.6 mg/dL (ref 8.4–10.5)
Chloride: 107 mEq/L (ref 96–112)
Creatinine, Ser: 1.2 mg/dL (ref 0.4–1.2)
GFR: 55.84 mL/min — ABNORMAL LOW (ref >60.00)
Sodium: 141 mEq/L (ref 135–145)

## 2013-11-04 ENCOUNTER — Telehealth: Payer: Self-pay | Admitting: Cardiology

## 2013-11-04 DIAGNOSIS — Z79899 Other long term (current) drug therapy: Secondary | ICD-10-CM

## 2013-11-04 NOTE — Telephone Encounter (Signed)
New message  Patients daughter has questions regarding medication changes. Please call and advise.

## 2013-11-04 NOTE — Telephone Encounter (Signed)
She should only be taking Klor con 10 meq 1 tablet daily now - please find out what dose of potassium she has been taking for the last week

## 2013-11-04 NOTE — Telephone Encounter (Signed)
To Dr Turner to advise 

## 2013-11-04 NOTE — Telephone Encounter (Signed)
Follow Up   Daughter returned call// Blood test were given// the question is should the pt go back to taking 1 potassium pill per day vs 4 per day// please call back to discuss

## 2013-11-05 ENCOUNTER — Encounter: Payer: Self-pay | Admitting: Pulmonary Disease

## 2013-11-05 ENCOUNTER — Ambulatory Visit (INDEPENDENT_AMBULATORY_CARE_PROVIDER_SITE_OTHER): Payer: Medicare PPO | Admitting: Pulmonary Disease

## 2013-11-05 VITALS — BP 148/60 | HR 78 | Ht 64.0 in | Wt 180.0 lb

## 2013-11-05 DIAGNOSIS — J189 Pneumonia, unspecified organism: Secondary | ICD-10-CM | POA: Insufficient documentation

## 2013-11-05 DIAGNOSIS — I2789 Other specified pulmonary heart diseases: Secondary | ICD-10-CM

## 2013-11-05 DIAGNOSIS — G4710 Hypersomnia, unspecified: Secondary | ICD-10-CM

## 2013-11-05 DIAGNOSIS — J449 Chronic obstructive pulmonary disease, unspecified: Secondary | ICD-10-CM | POA: Insufficient documentation

## 2013-11-05 DIAGNOSIS — I272 Pulmonary hypertension, unspecified: Secondary | ICD-10-CM

## 2013-11-05 DIAGNOSIS — R400 Somnolence: Secondary | ICD-10-CM | POA: Insufficient documentation

## 2013-11-05 DIAGNOSIS — R0602 Shortness of breath: Secondary | ICD-10-CM

## 2013-11-05 MED ORDER — POTASSIUM CHLORIDE CRYS ER 20 MEQ PO TBCR: 20.0000 meq | EXTENDED_RELEASE_TABLET | Freq: Once | ORAL | Status: DC

## 2013-11-05 NOTE — Telephone Encounter (Signed)
Pt was up to 20 MEQ BID. Her Potassium level is now normal and wants to know if she can go back down to 20 MEQ or stay on the 80

## 2013-11-05 NOTE — Assessment & Plan Note (Signed)
She has been given this diagnosis recently but has never smoked.  I also question some degree of bronchiectasis given a history of childhood pertussis and annual bronchitis over the years.  Plan: -start with Full PFT's -I agree with holding dulera for now

## 2013-11-05 NOTE — Telephone Encounter (Signed)
Follow Up:  Pt's daughter is still waiting on a call from the nurse.

## 2013-11-05 NOTE — Telephone Encounter (Signed)
Pt is aware and set up to go have BMET done on Tuesday pt can not come Monday due to two other Dr. Algie Coffer scheduled already that day.

## 2013-11-05 NOTE — Assessment & Plan Note (Signed)
This is mostly like secondary to her underlying diastolic dysfunction and possibly mitral valvular disease.  It is also possible that she has underlying sleep apnea given her daytime somnolence and heavy snoring. There is also the possibility of underlying lung disease.    However, considering her her underlying connective tissue disease we should not completely rule out the possibility of WHO class I pulmonary hypertension.  She has not had a Right heart catheterization which is the only way to truly diagnose PH.  I question whether or not her increasing dyspnea over the last two - three years is due to [facility].  Plan: -Given the high likelihood of WHO class II or III pulmonary hypertension, we will defer RHC right now -PFT to look for underlying lung disease -6 Minute walk now and again in 3 months -Sleep study to evaluate for sleep apnea -plan follow up in 3 months, if worsening dyspnea or , will move ahead with RHC

## 2013-11-05 NOTE — Telephone Encounter (Signed)
Please have her go  to Kdur 1 tablet daily and recheck BMET on Monday

## 2013-11-05 NOTE — Progress Notes (Signed)
Subjective:    Patient ID: Monica Blankenship, female    DOB: Apr 29, 1929, 79 y.o.   MRN: [id number]  HPI  This is a very pleasant 79 year old female who comes to our clinic today for evaluation of shortness of breath and cough. She had pertussis as a child but was never hospitalized for any respiratory illnesses. She occasionally smoked cigarettes in high school but never smoked regularly as an adult. In the last 2-3 years she's developed increasing shortness of breath and has been diagnosed with congestive heart failure which is being treated by a cardiology (Dr. Mayford Knife). She was referred to me today because a recent echocardiogram showed pulmonary hypertension.  The last several months of been rough. She had an episode of bronchitis which lasted for several weeks and eventually lead to pneumonia apparently. She was treated with multiple rounds of antibiotics for several weeks. She a lot of dyspnea during this time. She was diagnosed with chronic bronchitis during that time and was placed on an inhaler. She received breathing treatments at her doctor's office which would help and she said that the inhaler Elwin Sleight) would also help with her breathing when she would use it at home. She had fever and some sputum production during this time. Also during this time she had some leg swelling in pulmonary vascular congestion noted on chest imaging. Per my review of recent cardiology records it seems that she was treated with increasing doses of diuretics during this time. By the time she saw Dr. Mayford Knife again in November of 2014 her symptoms it started to improve yet she wasn't still quite recovered.  At this point, she says that the cough is resolved. She is no longer using the [facility]. She still feels somewhat weak but she is much better than she was several weeks ago. She is not having leg swelling or chest pain.  Her family describes significant daytime somnolence and heavy snoring at night. She has never had  a sleep study.  She has had episodes of recurrent bronchitis on an annual basis.    Past Medical History  Diagnosis Date  . Allergy   . CHF (congestive heart failure)   . Anxiety   . Insomnia   . Osteoarthritis   . Anemia   . Fatigue   . Obesity   . Diastolic dysfunction   . CKD (chronic kidney disease)   . Mitral stenosis   . Aortic stenosis   . Aortic regurgitation   . HTN (hypertension)      No family history on file.   History   Social History  . Marital Status: Married    Spouse Name: N/A    Number of Children: N/A  . Years of Education: N/A   Occupational History  . Not on file.   Social History Main Topics  . Smoking status: Former Smoker -- 0.25 packs/day for 1 years  . Smokeless tobacco: Not on file     Comment: smoked 1-3 cigs/day in school for about a year some days only  . Alcohol Use: No  . Drug Use: No  . Sexual Activity: Not on file   Other Topics Concern  . Not on file   Social History Narrative  . No narrative on file     No Known Allergies   Outpatient Prescriptions Prior to Visit  Medication Sig Dispense Refill  . ALPRAZolam (XANAX) 0.25 MG tablet Take 0.25 mg by mouth 2 (two) times daily as needed.      Marland Kitchen  aspirin 81 MG chewable tablet Chew 81 mg by mouth daily.      . calcium gluconate 500 MG tablet Take 500 mg by mouth 2 (two) times daily.      . cholecalciferol (VITAMIN D) 1000 UNITS tablet Take 1,000 Units by mouth daily.      Marland Kitchen doxazosin (CARDURA) 8 MG tablet Take 8 mg by mouth at bedtime.      . ferrous sulfate 325 (65 FE) MG tablet Take 325 mg by mouth daily with breakfast.      . folic acid (FOLVITE) 1 MG tablet Take 1 mg by mouth daily.      . furosemide (LASIX) 40 MG tablet Take 0.5 tablets (20 mg total) by mouth daily.      Marland Kitchen loratadine (CLARITIN) 10 MG tablet Take 10 mg by mouth as needed.      . methotrexate (RHEUMATREX) 2.5 MG tablet Take 2.5 mg by mouth once a week. Caution:Chemotherapy. Protect from light.      .  metoprolol tartrate (LOPRESSOR) 25 MG tablet Take 1 tablet (25 mg total) by mouth 2 (two) times daily.      . mometasone-formoterol (DULERA) 100-5 MCG/ACT AERO Inhale 2 puffs into the lungs as needed.       Bertram Gala Glycol-Propyl Glycol (SYSTANE) 0.4-0.3 % SOLN Apply to eye.      . potassium chloride SA (K-DUR,KLOR-CON) 20 MEQ tablet 2 tabs 2 times per day  120 tablet  6  . predniSONE (DELTASONE) 5 MG tablet Take 7.5 mg by mouth. Take 1.5 tablets      . traMADol (ULTRAM) 50 MG tablet Take 50 mg by mouth 3 (three) times daily as needed.      . VENTOLIN HFA 108 (90 BASE) MCG/ACT inhaler 2 puffs every 6 (six) hours as needed.       . verapamil (COVERA HS) 240 MG (CO) 24 hr tablet Take 240 mg by mouth at bedtime.      Marland Kitchen diptheria-tetanus toxoids [facility]) 2-2 LF/0.5ML injection        No facility-administered medications prior to visit.      Review of Systems  Constitutional: Negative for fever and unexpected weight change.  HENT: Negative for congestion, dental problem, ear pain, nosebleeds, postnasal drip, rhinorrhea, sinus pressure, sneezing, sore throat and trouble swallowing.   Eyes: Negative for redness and itching.  Respiratory: Positive for cough and shortness of breath. Negative for chest tightness and wheezing.   Cardiovascular: Negative for palpitations and leg swelling.  Gastrointestinal: Negative for nausea and vomiting.  Genitourinary: Negative for dysuria.  Musculoskeletal: Negative for joint swelling.  Skin: Negative for rash.  Neurological: Negative for headaches.  Hematological: Does not bruise/bleed easily.  Psychiatric/Behavioral: Negative for dysphoric mood. The patient is not nervous/anxious.        Objective:   Physical Exam  Filed Vitals:   11/05/13 0907  BP: 148/60  Pulse: 78  Height: 5\' 4"  (1.626 m)  Weight: 180 lb (81.647 kg)  SpO2: 97%   RA  Gen: well appearing, no acute distress HEENT: NCAT, PERRL, EOMi, OP clear, neck supple without  masses PULM: CTA B CV: RRR, prominent S4, no clear murmur, no JVD AB: BS+, soft, nontender, no hsm Ext: warm, no edema, no clubbing, no cyanosis Derm: no rash or skin breakdown Neuro: A&Ox4, CN II-XII intact, strength 5/5 in all 4 extremities  Multiple CXR's reviewed from October and November 2014, some pulmonary vascular congestion noted in October; I question a RML infiltrate on the  10/2013 study     Assessment & Plan:   Pulmonary HTN This is mostly like secondary to her underlying diastolic dysfunction and possibly mitral valvular disease.  It is also possible that she has underlying sleep apnea given her daytime somnolence and heavy snoring. There is also the possibility of underlying lung disease.    However, considering her her underlying connective tissue disease we should not completely rule out the possibility of WHO class I pulmonary hypertension.  She has not had a Right heart catheterization which is the only way to truly diagnose PH.  I question whether or not her increasing dyspnea over the last two - three years is due to [facility].  Plan: -Given the high likelihood of WHO class II or III pulmonary hypertension, we will defer RHC right now -PFT to look for underlying lung disease -6 Minute walk now and again in 3 months -Sleep study to evaluate for sleep apnea -plan follow up in 3 months, if worsening dyspnea or , will move ahead with RHC  Obstructive chronic bronchitis without exacerbation She has been given this diagnosis recently but has never smoked.  I also question some degree of bronchiectasis given a history of childhood pertussis and annual bronchitis over the years.  Plan: -start with Full PFT's -I agree with holding dulera for now  Daytime somnolence She and her family describes significant daytime somnolence and heavy sleeping at night. This is suggestive of obstructive sleep apnea which can be associated with pulmonary hypertension.  Plan: -In addition to  a workup for underlying lung disease we will arrange a sleep study at our sleep center here in town.   Updated Medication List Outpatient Encounter Prescriptions as of 11/05/2013  Medication Sig  . ALPRAZolam (XANAX) 0.25 MG tablet Take 0.25 mg by mouth 2 (two) times daily as needed.  Marland Kitchen aspirin 81 MG chewable tablet Chew 81 mg by mouth daily.  . calcium gluconate 500 MG tablet Take 500 mg by mouth 2 (two) times daily.  . cholecalciferol (VITAMIN D) 1000 UNITS tablet Take 1,000 Units by mouth daily.  Marland Kitchen doxazosin (CARDURA) 8 MG tablet Take 8 mg by mouth at bedtime.  . ferrous sulfate 325 (65 FE) MG tablet Take 325 mg by mouth daily with breakfast.  . folic acid (FOLVITE) 1 MG tablet Take 1 mg by mouth daily.  . furosemide (LASIX) 40 MG tablet Take 0.5 tablets (20 mg total) by mouth daily.  Marland Kitchen loratadine (CLARITIN) 10 MG tablet Take 10 mg by mouth as needed.  . methotrexate (RHEUMATREX) 2.5 MG tablet Take 2.5 mg by mouth once a week. Caution:Chemotherapy. Protect from light.  . metoprolol tartrate (LOPRESSOR) 25 MG tablet Take 1 tablet (25 mg total) by mouth 2 (two) times daily.  . mometasone-formoterol (DULERA) 100-5 MCG/ACT AERO Inhale 2 puffs into the lungs as needed.   Bertram Gala Glycol-Propyl Glycol (SYSTANE) 0.4-0.3 % SOLN Apply to eye.  . predniSONE (DELTASONE) 5 MG tablet Take 7.5 mg by mouth. Take 1.5 tablets  . traMADol (ULTRAM) 50 MG tablet Take 50 mg by mouth 3 (three) times daily as needed.  . VENTOLIN HFA 108 (90 BASE) MCG/ACT inhaler 2 puffs every 6 (six) hours as needed.   . verapamil (COVERA HS) 240 MG (CO) 24 hr tablet Take 240 mg by mouth at bedtime.  . [DISCONTINUED] diptheria-tetanus toxoids (DECAVAC) 2-2 LF/0.5ML injection   . [DISCONTINUED] potassium chloride SA (K-DUR,KLOR-CON) 20 MEQ tablet 2 tabs 2 times per day

## 2013-11-05 NOTE — Assessment & Plan Note (Signed)
She and her family describes significant daytime somnolence and heavy sleeping at night. This is suggestive of obstructive sleep apnea which can be associated with pulmonary hypertension.  Plan: -In addition to a workup for underlying lung disease we will arrange a sleep study at our sleep center here in town.

## 2013-11-05 NOTE — Patient Instructions (Signed)
We will arrange a 6 minute walk and pulmonary function testing for you and then repeat it in 3 months We will arrange a sleep study to look for sleep apnea and call you with the results.

## 2013-11-09 ENCOUNTER — Other Ambulatory Visit (INDEPENDENT_AMBULATORY_CARE_PROVIDER_SITE_OTHER): Payer: Medicare PPO

## 2013-11-09 DIAGNOSIS — Z79899 Other long term (current) drug therapy: Secondary | ICD-10-CM

## 2013-11-09 LAB — BASIC METABOLIC PANEL
BUN: 17 mg/dL (ref 6–23)
Chloride: 106 mEq/L (ref 96–112)
GFR: 59.89 mL/min — ABNORMAL LOW (ref >60.00)
Glucose, Bld: 132 mg/dL — ABNORMAL HIGH (ref 70–99)
Potassium: 3.5 mEq/L (ref 3.5–5.1)
Sodium: 140 mEq/L (ref 135–145)

## 2013-11-10 ENCOUNTER — Telehealth: Payer: Self-pay | Admitting: Cardiology

## 2013-11-10 ENCOUNTER — Other Ambulatory Visit: Payer: Medicare PPO

## 2013-11-10 DIAGNOSIS — Z79899 Other long term (current) drug therapy: Secondary | ICD-10-CM

## 2013-11-10 MED ORDER — POTASSIUM CHLORIDE CRYS ER 20 MEQ PO TBCR: EXTENDED_RELEASE_TABLET | ORAL | Status: DC

## 2013-11-10 NOTE — Telephone Encounter (Signed)
Message copied by Jerelyn Scott Levonte Molina H on Tue Nov 10, 2013  8:30 AM ------      Message from: Armanda Magic R      Created: Tue Nov 10, 2013  8:17 AM       Potassium borderline low - please have her increase Kdur to 1 tablet twice daily and recheck BMET in 1 week ------

## 2013-11-10 NOTE — Telephone Encounter (Signed)
Pts daughter [name] notified. Meds updated and Bmet ordered for 11/17/13.

## 2013-11-17 ENCOUNTER — Emergency Department (HOSPITAL_COMMUNITY): Payer: Medicare PPO

## 2013-11-17 ENCOUNTER — Observation Stay (HOSPITAL_COMMUNITY)
Admission: EM | Admit: 2013-11-17 | Discharge: 2013-11-19 | Disposition: A | Discharge status: Home / Self Care | Payer: Medicare PPO | Attending: Internal Medicine | Admitting: Internal Medicine

## 2013-11-17 ENCOUNTER — Other Ambulatory Visit (INDEPENDENT_AMBULATORY_CARE_PROVIDER_SITE_OTHER): Payer: Medicare PPO

## 2013-11-17 ENCOUNTER — Encounter (HOSPITAL_COMMUNITY): Payer: Self-pay | Admitting: Emergency Medicine

## 2013-11-17 DIAGNOSIS — R001 Bradycardia, unspecified: Secondary | ICD-10-CM

## 2013-11-17 DIAGNOSIS — R739 Hyperglycemia, unspecified: Secondary | ICD-10-CM

## 2013-11-17 DIAGNOSIS — I350 Nonrheumatic aortic (valve) stenosis: Secondary | ICD-10-CM

## 2013-11-17 DIAGNOSIS — I6789 Other cerebrovascular disease: Secondary | ICD-10-CM | POA: Insufficient documentation

## 2013-11-17 DIAGNOSIS — I080 Rheumatic disorders of both mitral and aortic valves: Secondary | ICD-10-CM | POA: Insufficient documentation

## 2013-11-17 DIAGNOSIS — I272 Pulmonary hypertension, unspecified: Secondary | ICD-10-CM | POA: Diagnosis present

## 2013-11-17 DIAGNOSIS — T502X5A Adverse effect of carbonic-anhydrase inhibitors, benzothiadiazides and other diuretics, initial encounter: Secondary | ICD-10-CM | POA: Insufficient documentation

## 2013-11-17 DIAGNOSIS — N182 Chronic kidney disease, stage 2 (mild): Secondary | ICD-10-CM | POA: Insufficient documentation

## 2013-11-17 DIAGNOSIS — Z7982 Long term (current) use of aspirin: Secondary | ICD-10-CM | POA: Insufficient documentation

## 2013-11-17 DIAGNOSIS — E669 Obesity, unspecified: Secondary | ICD-10-CM | POA: Insufficient documentation

## 2013-11-17 DIAGNOSIS — E875 Hyperkalemia: Secondary | ICD-10-CM

## 2013-11-17 DIAGNOSIS — M069 Rheumatoid arthritis, unspecified: Secondary | ICD-10-CM | POA: Insufficient documentation

## 2013-11-17 DIAGNOSIS — F419 Anxiety disorder, unspecified: Secondary | ICD-10-CM | POA: Diagnosis present

## 2013-11-17 DIAGNOSIS — I5032 Chronic diastolic (congestive) heart failure: Secondary | ICD-10-CM

## 2013-11-17 DIAGNOSIS — I10 Essential (primary) hypertension: Secondary | ICD-10-CM

## 2013-11-17 DIAGNOSIS — I359 Nonrheumatic aortic valve disorder, unspecified: Secondary | ICD-10-CM

## 2013-11-17 DIAGNOSIS — F411 Generalized anxiety disorder: Secondary | ICD-10-CM | POA: Insufficient documentation

## 2013-11-17 DIAGNOSIS — M1990 Unspecified osteoarthritis, unspecified site: Secondary | ICD-10-CM | POA: Insufficient documentation

## 2013-11-17 DIAGNOSIS — I351 Nonrheumatic aortic (valve) insufficiency: Secondary | ICD-10-CM

## 2013-11-17 DIAGNOSIS — I959 Hypotension, unspecified: Secondary | ICD-10-CM

## 2013-11-17 DIAGNOSIS — I498 Other specified cardiac arrhythmias: Secondary | ICD-10-CM

## 2013-11-17 DIAGNOSIS — I2789 Other specified pulmonary heart diseases: Secondary | ICD-10-CM | POA: Insufficient documentation

## 2013-11-17 DIAGNOSIS — G319 Degenerative disease of nervous system, unspecified: Secondary | ICD-10-CM | POA: Insufficient documentation

## 2013-11-17 DIAGNOSIS — Z87891 Personal history of nicotine dependence: Secondary | ICD-10-CM | POA: Insufficient documentation

## 2013-11-17 DIAGNOSIS — N189 Chronic kidney disease, unspecified: Secondary | ICD-10-CM | POA: Diagnosis present

## 2013-11-17 DIAGNOSIS — D649 Anemia, unspecified: Secondary | ICD-10-CM

## 2013-11-17 DIAGNOSIS — N179 Acute kidney failure, unspecified: Principal | ICD-10-CM

## 2013-11-17 DIAGNOSIS — D72829 Elevated white blood cell count, unspecified: Secondary | ICD-10-CM

## 2013-11-17 DIAGNOSIS — I509 Heart failure, unspecified: Secondary | ICD-10-CM

## 2013-11-17 DIAGNOSIS — E860 Dehydration: Secondary | ICD-10-CM | POA: Insufficient documentation

## 2013-11-17 DIAGNOSIS — I129 Hypertensive chronic kidney disease with stage 1 through stage 4 chronic kidney disease, or unspecified chronic kidney disease: Secondary | ICD-10-CM | POA: Insufficient documentation

## 2013-11-17 DIAGNOSIS — Z79899 Other long term (current) drug therapy: Secondary | ICD-10-CM | POA: Insufficient documentation

## 2013-11-17 LAB — CBC WITH DIFFERENTIAL/PLATELET
Basophils Relative: 0 % (ref 0–1)
Eosinophils Absolute: 0 10*3/uL (ref 0.0–0.7)
Eosinophils Relative: 0 % (ref 0–5)
HCT: 32.2 % — ABNORMAL LOW (ref 36.0–46.0)
Hemoglobin: 10.2 g/dL — ABNORMAL LOW (ref 12.0–15.0)
Lymphs Abs: 1.6 10*3/uL (ref 0.7–4.0)
MCH: 32.7 pg (ref 26.0–34.0)
MCHC: 31.7 g/dL (ref 30.0–36.0)
MCV: 103.2 fL — ABNORMAL HIGH (ref 78.0–100.0)
Monocytes Absolute: 0.5 10*3/uL (ref 0.1–1.0)
Monocytes Relative: 4 % (ref 3–12)
Neutro Abs: 10.7 10*3/uL — ABNORMAL HIGH (ref 1.7–7.7)
Neutrophils Relative %: 83 % — ABNORMAL HIGH (ref 43–77)
RBC: 3.12 MIL/uL — ABNORMAL LOW (ref 3.87–5.11)

## 2013-11-17 LAB — URINALYSIS, ROUTINE W REFLEX MICROSCOPIC
Bilirubin Urine: NEGATIVE
Ketones, ur: 15 mg/dL — AB
Leukocytes, UA: NEGATIVE
Nitrite: NEGATIVE
Protein, ur: 100 mg/dL — AB
pH: 5 (ref 5.0–8.0)

## 2013-11-17 LAB — TROPONIN I
Troponin I: <0.3 ng/mL (ref <0.30)
Troponin I: <0.3 ng/mL (ref <0.30)

## 2013-11-17 LAB — URINE MICROSCOPIC-ADD ON

## 2013-11-17 LAB — BASIC METABOLIC PANEL
BUN: 21 mg/dL (ref 6–23)
CO2: 23 mEq/L (ref 19–32)
Chloride: 107 mEq/L (ref 96–112)
Glucose, Bld: 117 mg/dL — ABNORMAL HIGH (ref 70–99)
Potassium: 3.9 mEq/L (ref 3.5–5.1)
Sodium: 138 mEq/L (ref 135–145)

## 2013-11-17 LAB — COMPREHENSIVE METABOLIC PANEL
Albumin: 3.4 g/dL — ABNORMAL LOW (ref 3.5–5.2)
Alkaline Phosphatase: 77 U/L (ref 39–117)
BUN: 25 mg/dL — ABNORMAL HIGH (ref 6–23)
Creatinine, Ser: 1.75 mg/dL — ABNORMAL HIGH (ref 0.50–1.10)
GFR calc Af Amer: 30 mL/min — ABNORMAL LOW (ref >90)
GFR calc non Af Amer: 26 mL/min — ABNORMAL LOW (ref >90)
Glucose, Bld: 215 mg/dL — ABNORMAL HIGH (ref 70–99)
Potassium: 5.3 mEq/L — ABNORMAL HIGH (ref 3.5–5.1)
Total Protein: 6.4 g/dL (ref 6.0–8.3)

## 2013-11-17 MED ORDER — DOXAZOSIN MESYLATE 8 MG PO TABS
8.0000 mg | ORAL_TABLET | Freq: Every day | ORAL | Status: DC
Start: 2013-11-17 — End: 2013-11-17
  Filled 2013-11-17: qty 1

## 2013-11-17 MED ORDER — PREDNISONE 5 MG PO TABS
7.5000 mg | ORAL_TABLET | Freq: Every day | ORAL | Status: DC
  Administered 2013-11-18: 08:00:00 7.5 mg via ORAL
  Filled 2013-11-17 (×3): qty 1

## 2013-11-17 MED ORDER — ALPRAZOLAM 0.25 MG PO TABS
0.2500 mg | ORAL_TABLET | Freq: Two times a day (BID) | ORAL | Status: DC | PRN
  Administered 2013-11-17: 0.25 mg via ORAL
  Filled 2013-11-17: qty 1

## 2013-11-17 MED ORDER — ASPIRIN 81 MG PO CHEW
81.0000 mg | CHEWABLE_TABLET | Freq: Every day | ORAL | Status: DC
Start: 2013-11-17 — End: 2013-11-19
  Administered 2013-11-17 – 2013-11-18 (×2): 81 mg via ORAL
  Filled 2013-11-17 (×2): qty 1

## 2013-11-17 MED ORDER — VITAMIN D3 25 MCG (1000 UNIT) PO TABS
1000.0000 [IU] | ORAL_TABLET | Freq: Every day | ORAL | Status: DC
  Administered 2013-11-18: 1000 [IU] via ORAL
  Filled 2013-11-17 (×3): qty 1

## 2013-11-17 MED ORDER — METHOTREXATE 2.5 MG PO TABS: 2.5000 mg | ORAL_TABLET | ORAL | Status: DC

## 2013-11-17 MED ORDER — WHITE PETROLATUM GEL
Status: AC
  Filled 2013-11-17: qty 5

## 2013-11-17 MED ORDER — TRAMADOL HCL 50 MG PO TABS
50.0000 mg | ORAL_TABLET | Freq: Three times a day (TID) | ORAL | Status: DC | PRN
  Administered 2013-11-17 (×2): 50 mg via ORAL
  Filled 2013-11-17 (×2): qty 1

## 2013-11-17 MED ORDER — ONDANSETRON HCL 4 MG PO TABS: 4.0000 mg | ORAL_TABLET | Freq: Four times a day (QID) | ORAL | Status: DC | PRN

## 2013-11-17 MED ORDER — HEPARIN SODIUM (PORCINE) 5000 UNIT/ML IJ SOLN
5000.0000 [IU] | Freq: Three times a day (TID) | INTRAMUSCULAR | Status: DC
  Administered 2013-11-17 – 2013-11-19 (×4): 5000 [IU] via SUBCUTANEOUS
  Filled 2013-11-17 (×8): qty 1

## 2013-11-17 MED ORDER — FOLIC ACID 1 MG PO TABS
1.0000 mg | ORAL_TABLET | Freq: Every day | ORAL | Status: DC
  Administered 2013-11-18: 1 mg via ORAL
  Filled 2013-11-17 (×3): qty 1

## 2013-11-17 MED ORDER — CALCIUM GLUCONATE 500 MG PO TABS
500.0000 mg | ORAL_TABLET | Freq: Two times a day (BID) | ORAL | Status: DC
  Administered 2013-11-18 (×2): 500 mg via ORAL
  Filled 2013-11-17 (×5): qty 1

## 2013-11-17 MED ORDER — SODIUM CHLORIDE 0.9 % IJ SOLN
3.0000 mL | Freq: Two times a day (BID) | INTRAMUSCULAR | Status: DC
  Administered 2013-11-18 (×2): 3 mL via INTRAVENOUS

## 2013-11-17 MED ORDER — MOMETASONE FURO-FORMOTEROL FUM 100-5 MCG/ACT IN AERO
2.0000 | INHALATION_SPRAY | RESPIRATORY_TRACT | Status: DC | PRN
  Filled 2013-11-17: qty 8.8

## 2013-11-17 MED ORDER — GUAIFENESIN-DM 100-10 MG/5ML PO SYRP
5.0000 mL | ORAL_SOLUTION | ORAL | Status: DC | PRN
  Filled 2013-11-17: qty 5

## 2013-11-17 MED ORDER — LORATADINE 10 MG PO TABS
10.0000 mg | ORAL_TABLET | ORAL | Status: DC | PRN
  Filled 2013-11-17: qty 1

## 2013-11-17 MED ORDER — POLYETHYL GLYCOL-PROPYL GLYCOL 0.4-0.3 % OP SOLN: 1.0000 [drp] | OPHTHALMIC | Status: DC | PRN

## 2013-11-17 MED ORDER — ALBUTEROL SULFATE HFA 108 (90 BASE) MCG/ACT IN AERS
2.0000 | INHALATION_SPRAY | Freq: Four times a day (QID) | RESPIRATORY_TRACT | Status: DC | PRN
  Filled 2013-11-17: qty 6.7

## 2013-11-17 MED ORDER — ACETAMINOPHEN 650 MG RE SUPP: 650.0000 mg | Freq: Four times a day (QID) | RECTAL | Status: DC | PRN

## 2013-11-17 MED ORDER — POLYVINYL ALCOHOL 1.4 % OP SOLN
1.0000 [drp] | OPHTHALMIC | Status: DC | PRN
  Filled 2013-11-17: qty 15

## 2013-11-17 MED ORDER — ONDANSETRON HCL 4 MG/2ML IJ SOLN: 4.0000 mg | Freq: Four times a day (QID) | INTRAMUSCULAR | Status: DC | PRN

## 2013-11-17 MED ORDER — HYDROXYCHLOROQUINE SULFATE 200 MG PO TABS
200.0000 mg | ORAL_TABLET | Freq: Two times a day (BID) | ORAL | Status: DC
  Administered 2013-11-17 – 2013-11-18 (×3): 200 mg via ORAL
  Filled 2013-11-17 (×5): qty 1

## 2013-11-17 MED ORDER — SODIUM CHLORIDE 0.9 % IV BOLUS (SEPSIS)
1000.0000 mL | Freq: Once | INTRAVENOUS | Status: AC
  Administered 2013-11-17: 1000 mL via INTRAVENOUS

## 2013-11-17 MED ORDER — ALBUTEROL SULFATE (5 MG/ML) 0.5% IN NEBU: 2.5000 mg | INHALATION_SOLUTION | RESPIRATORY_TRACT | Status: DC | PRN

## 2013-11-17 MED ORDER — ACETAMINOPHEN 325 MG PO TABS: 650.0000 mg | ORAL_TABLET | Freq: Four times a day (QID) | ORAL | Status: DC | PRN

## 2013-11-17 MED ORDER — FERROUS SULFATE 325 (65 FE) MG PO TABS
325.0000 mg | ORAL_TABLET | Freq: Every day | ORAL | Status: DC
  Administered 2013-11-18: 325 mg via ORAL
  Filled 2013-11-17 (×3): qty 1

## 2013-11-17 MED ORDER — SODIUM CHLORIDE 0.9 % IV SOLN: INTRAVENOUS | Status: AC

## 2013-11-17 NOTE — ED Notes (Signed)
Patient transported to X-ray 

## 2013-11-17 NOTE — ED Notes (Signed)
Stroke scale negative. Daughter states she had slurred speech when she talked with her on the phone. Pt alert and oriented x 4. No facial droop, no arm drift, grips equal.

## 2013-11-17 NOTE — ED Notes (Signed)
Per EMS pt from home with c/o generalized weakness, right shoulder pain and neck pain. No injury noted. Pt had bloodwork drawn this morning. Got home and shoulder began to hurt. On EMS arrival, pt pale, bradycardic in 40's and hypotensive in 80's. A&O x 4. IV 22G LAC. Hx of CHF.

## 2013-11-17 NOTE — H&P (Signed)
Patient's PCP: Georgann Housekeeper, MD  Chief Complaint: Generalized weakness  History of Present Illness: Monica Blankenship is a 79 y.o. African American female with history of chronic diastolic congestive heart failure, anxiety, insomnia, osteoarthritis, anemia, chronic kidney disease stage II, aortic stenosis and regurgitation, hypertension, and obesity presents with the above complaints.  Patient provided some of the history, but daughter at bedside provided most of the history.  Patient was restarted on Lasix last month, her cardiologist has been monitoring her electrolytes on a frequent basis.  She had labs this morning for routine followup.  On the way to her home, she developed back pain, shoulder pain, and right arm pain.  EMS was called, they found the patient to be slightly confused.  They also found the patient to be hypotensive with blood pressure of 80s over 60s and bradycardia with heart rate in the 40s.  She was brought to the emergency department for further evaluation.  In the emergency department patient received some IV fluids with improvement in her blood pressure.  Her creatinine was 1.75.  Hospitalist service was asked by the patient for further care and management.  Denies any recent fevers, chills, chest pain, shortness of breath, abdominal pain, diarrhea, headaches or vision changes.  Patient did complain of some nausea.  Review of Systems: All systems reviewed with the patient and positive as per history of present illness, otherwise all other systems are negative.  Past Medical History  Diagnosis Date  . Allergy   . CHF (congestive heart failure)   . Anxiety   . Insomnia   . Osteoarthritis   . Anemia   . Fatigue   . Obesity   . Diastolic dysfunction   . CKD (chronic kidney disease)   . Mitral stenosis   . Aortic stenosis   . Aortic regurgitation   . HTN (hypertension)    Past Surgical History  Procedure Laterality Date  . Eye surgery    . Abdominal hysterectomy     . Appendectomy    . Cataract extraction, bilateral     Family History  Problem Relation Age of Onset  . Hypertension Mother   . CVA Mother    History   Social History  . Marital Status: Married    Spouse Name: N/A    Number of Children: N/A  . Years of Education: N/A   Occupational History  . Not on file.   Social History Main Topics  . Smoking status: Former Smoker -- 0.25 packs/day for 1 years  . Smokeless tobacco: Not on file     Comment: smoked 1-3 cigs/day in school for about a year some days only  . Alcohol Use: No  . Drug Use: No  . Sexual Activity: Not on file   Other Topics Concern  . Not on file   Social History Narrative  . No narrative on file   Allergies: Review of patient's allergies indicates no known allergies.  Home Meds: Prior to Admission medications   Medication Sig Start Date End Date Taking? Authorizing Provider  ALPRAZolam (XANAX) 0.25 MG tablet Take 0.25 mg by mouth 2 (two) times daily as needed for anxiety.    Yes Historical Provider, MD  aspirin 81 MG chewable tablet Chew 81 mg by mouth daily.   Yes Historical Provider, MD  calcium gluconate 500 MG tablet Take 500 mg by mouth 2 (two) times daily.   Yes Historical Provider, MD  cholecalciferol (VITAMIN D) 1000 UNITS tablet Take 1,000 Units by mouth daily.  Yes Historical Provider, MD  doxazosin (CARDURA) 8 MG tablet Take 8 mg by mouth at bedtime.   Yes Historical Provider, MD  ferrous sulfate 325 (65 FE) MG tablet Take 325 mg by mouth daily with breakfast.   Yes Historical Provider, MD  folic acid (FOLVITE) 1 MG tablet Take 1 mg by mouth daily.   Yes Historical Provider, MD  furosemide (LASIX) 40 MG tablet Take 40 mg by mouth 2 (two) times daily.   Yes Historical Provider, MD  hydroxychloroquine (PLAQUENIL) 200 MG tablet Take 200 mg by mouth 2 (two) times daily.   Yes Historical Provider, MD  loratadine (CLARITIN) 10 MG tablet Take 10 mg by mouth as needed for allergies.    Yes Historical  Provider, MD  methotrexate (RHEUMATREX) 2.5 MG tablet Take 2.5 mg by mouth once a week. Caution:Chemotherapy. Protect from light.   Yes Historical Provider, MD  metoprolol tartrate (LOPRESSOR) 25 MG tablet Take 1 tablet (25 mg total) by mouth 2 (two) times daily. 10/21/13  Yes Quintella Reichert, MD  mometasone-formoterol (DULERA) 100-5 MCG/ACT AERO Inhale 2 puffs into the lungs as needed for wheezing or shortness of breath.    Yes Historical Provider, MD  Polyethyl Glycol-Propyl Glycol (SYSTANE) 0.4-0.3 % SOLN Apply 1 drop to eye as needed (for dry eyes.).    Yes Historical Provider, MD  potassium chloride (K-DUR) 10 MEQ tablet Take 10 mEq by mouth daily.   Yes Historical Provider, MD  potassium chloride SA (K-DUR,KLOR-CON) 20 MEQ tablet Take 20 mEq by mouth 2 (two) times daily.   Yes Historical Provider, MD  predniSONE (DELTASONE) 5 MG tablet Take 7.5 mg by mouth. Take 1.5 tablets   Yes Historical Provider, MD  traMADol (ULTRAM) 50 MG tablet Take 50 mg by mouth 3 (three) times daily as needed for moderate pain.    Yes Historical Provider, MD  VENTOLIN HFA 108 (90 BASE) MCG/ACT inhaler Inhale 2 puffs into the lungs every 6 (six) hours as needed for wheezing or shortness of breath.  10/17/13  Yes Historical Provider, MD  verapamil (COVERA HS) 240 MG (CO) 24 hr tablet Take 240 mg by mouth at bedtime.   Yes Historical Provider, MD    Physical Exam: Blood pressure 127/45, pulse 48, temperature 97.6 F (36.4 C), temperature source Oral, resp. rate 14, SpO2 97.00%. General: Awake, Oriented x3, except today's date but was oriented to month and year, No acute distress. HEENT: EOMI, Moist mucous membranes Neck: Supple CV: S1 and S2 Lungs: Clear to ascultation bilaterally Abdomen: Soft, Nontender, Nondistended, +bowel sounds. Ext: Good pulses. Trace edema. No clubbing or cyanosis noted. Neuro: Cranial Nerves II-XII grossly intact. Has 5/5 motor strength in upper and lower extremities.  Lab  results:  Recent Labs  11/17/13 0921 11/17/13 1604  NA 138 136  K 3.9 5.3*  CL 107 104  CO2 23 22  GLUCOSE 117* 215*  BUN 21 25*  CREATININE 1.2 1.75*  CALCIUM 9.3 9.7    Recent Labs  11/17/13 1604  AST 106*  ALT 169*  ALKPHOS 77  BILITOT 0.7  PROT 6.4  ALBUMIN 3.4*   No results found for this basename: LIPASE, AMYLASE,  in the last 72 hours  Recent Labs  11/17/13 1604  WBC 12.8*  NEUTROABS 10.7*  HGB 10.2*  HCT 32.2*  MCV 103.2*  PLT 175    Recent Labs  11/17/13 1604  TROPONINI <0.30   No components found with this basename: POCBNP,  No results found for this basename:  DDIMER,  in the last 72 hours No results found for this basename: HGBA1C,  in the last 72 hours No results found for this basename: CHOL, HDL, LDLCALC, TRIG, CHOLHDL, LDLDIRECT,  in the last 72 hours No results found for this basename: TSH, T4TOTAL, FREET3, T3FREE, THYROIDAB,  in the last 72 hours No results found for this basename: VITAMINB12, FOLATE, FERRITIN, TIBC, IRON, RETICCTPCT,  in the last 72 hours Imaging results:  Dg Chest 2 View  11/17/2013   CLINICAL DATA:  Weakness, bradycardia and hypertension.  EXAM: CHEST  2 VIEW  COMPARISON:  10/14/2013 and 09/04/2003  FINDINGS: Two views of the chest demonstrate mild enlargement of the cardiac silhouette which is unchanged. Again noted are prominent interstitial lung markings which are chronic. No definite pleural effusions. Mild degenerative changes in the thoracic spine. No evidence for focal airspace disease. Elevation of the humeral heads suggests chronic rotator cuff disease.  IMPRESSION: Chronic lung changes with prominent interstitial lung markings. Findings are chronic. No evidence for acute disease.   Electronically Signed   By: Richarda Overlie M.D.   On: 11/17/2013 17:33   Ct Head Wo Contrast  11/17/2013   CLINICAL DATA:  Episode of slurred speech earlier, history CHF, hypertension, chronic kidney disease, valvular heart disease  EXAM:  CT HEAD WITHOUT CONTRAST  TECHNIQUE: Contiguous axial images were obtained from the base of the skull through the vertex without intravenous contrast.  COMPARISON:  None  FINDINGS: Generalized atrophy.  Normal ventricular morphology.  No midline shift or mass effect.  Small vessel chronic ischemic changes of deep cerebral white matter.  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  No intracranial hemorrhage, mass lesion, or evidence acute infarction.  No extra-axial fluid collections.  Bones and sinuses unremarkable.  Minimal atherosclerotic calcification of the carotid siphons.  IMPRESSION: Atrophy with small vessel chronic ischemic changes of deep cerebral white matter.  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  No acute intracranial abnormalities.   Electronically Signed   By: Ulyses Southward M.D.   On: 11/17/2013 17:08   Other results: EKG: Bradycardia with heart rate of 46.  Assessment & Plan by Problem: Generalized weakness Likely due to hypotension and bradycardia from medications.  Hold patient's Lasix and antihypertensive medications.  Patient's weakness has improved in the emergency department.  Acute renal failure and chronic kidney disease stage II Creatinine this morning was 1.2.  Currently creatinine is 1.75 in the emergency department.  However due to patient's hypotension and diuresis Lasix.  Will gently hydrate the patient on IV fluids.  Hold patient's furosemide.  Reassess renal function in the morning.  Anemia Likely due to chronic disease and chronic kidney disease.  Hypotension Due to dehydration.  Improved with gentle hydration in the emergency department.  Bradycardia Monitor on telemetry.  Hold patient's metoprolol and verapamil.  Discussed with Dr. Mayford Knife, patient's cardiologist who will see the patient tonight or in the morning.  Cycle cardiac enzymes.  Chronic diastolic congestive heart failure Patient appears compensated at this time.  Hold patient's  furosemide due to above.  Chronic aortic stenosis and regurgitation Stable.  Hypertension Holding patient's antihypertensive medications due to above.  Pulmonary hypertension Chronic.  Prophylaxis Subcutaneous heparin.  CODE STATUS Full code.  This was discussed with the patient the time of admission.  Disposition Admit the patient to telemetry as observation to Dr. Venita Sheffield service.  Time spent on admission, talking to the patient, and coordinating care was: 50 mins.  Dejane Scheibe A, MD 11/17/2013, 7:41 PM

## 2013-11-17 NOTE — ED Provider Notes (Signed)
CSN: [id number]     Arrival date & time 11/17/13  1515 History   First MD Initiated Contact with Patient 11/17/13 1541     Chief Complaint  Patient presents with  . Weakness  . Bradycardia  . Hypotension   (Consider location/radiation/quality/duration/timing/severity/associated sxs/prior Treatment) HPI Monica Blankenship is a 79 y.o. female who presents to the emergency department with 6 hours of fatigue and weakness.  Daughter reports that she has simply been slower than usual.  Slurring speech a little bit.  Still oriented but different.  Then today she was sleeping a little more.  EMS called.  Patient found to have BP of 80s systolic and pulse in 40s.  No unilateral weakness/numbness.  No SOB.  No cough.  No changes in urination.  No pain.  No other symptoms.  Described as mild.  No other symptoms.  Past Medical History  Diagnosis Date  . Allergy   . CHF (congestive heart failure)   . Anxiety   . Insomnia   . Osteoarthritis   . Anemia   . Fatigue   . Obesity   . Diastolic dysfunction   . CKD (chronic kidney disease)   . Mitral stenosis   . Aortic stenosis   . Aortic regurgitation   . HTN (hypertension)    Past Surgical History  Procedure Laterality Date  . Eye surgery    . Abdominal hysterectomy    . Appendectomy    . Cataract extraction, bilateral     No family history on file. History  Substance Use Topics  . Smoking status: Former Smoker -- 0.25 packs/day for 1 years  . Smokeless tobacco: Not on file     Comment: smoked 1-3 cigs/day in school for about a year some days only  . Alcohol Use: No   OB History   Grav Para Term Preterm Abortions TAB SAB Ect Mult Living                 Review of Systems  Constitutional: Positive for activity change and fatigue. Negative for fever and chills.  HENT: Negative for congestion and rhinorrhea.   Respiratory: Negative for cough and shortness of breath.   Cardiovascular: Negative for chest pain.  Gastrointestinal:  Negative for nausea, vomiting, abdominal pain, diarrhea and abdominal distention.  Endocrine: Negative for polyuria.  Genitourinary: Negative for dysuria.  Musculoskeletal: Negative for neck pain and neck stiffness.  Skin: Negative for rash.  Neurological: Negative for dizziness, tremors, seizures, syncope, facial asymmetry, weakness, light-headedness, numbness and headaches.  Psychiatric/Behavioral: Negative.     Allergies  Review of patient's allergies indicates no known allergies.  Home Medications   Current Outpatient Rx  Name  Route  Sig  Dispense  Refill  . ALPRAZolam (XANAX) 0.25 MG tablet   Oral   Take 0.25 mg by mouth 2 (two) times daily as needed.         Marland Kitchen aspirin 81 MG chewable tablet   Oral   Chew 81 mg by mouth daily.         . calcium gluconate 500 MG tablet   Oral   Take 500 mg by mouth 2 (two) times daily.         . cholecalciferol (VITAMIN D) 1000 UNITS tablet   Oral   Take 1,000 Units by mouth daily.         Marland Kitchen doxazosin (CARDURA) 8 MG tablet   Oral   Take 8 mg by mouth at bedtime.         Marland Kitchen  ferrous sulfate 325 (65 FE) MG tablet   Oral   Take 325 mg by mouth daily with breakfast.         . folic acid (FOLVITE) 1 MG tablet   Oral   Take 1 mg by mouth daily.         . furosemide (LASIX) 40 MG tablet   Oral   Take 0.5 tablets (20 mg total) by mouth daily.         Marland Kitchen loratadine (CLARITIN) 10 MG tablet   Oral   Take 10 mg by mouth as needed.         . methotrexate (RHEUMATREX) 2.5 MG tablet   Oral   Take 2.5 mg by mouth once a week. Caution:Chemotherapy. Protect from light.         . metoprolol tartrate (LOPRESSOR) 25 MG tablet   Oral   Take 1 tablet (25 mg total) by mouth 2 (two) times daily.         . mometasone-formoterol (DULERA) 100-5 MCG/ACT AERO   Inhalation   Inhale 2 puffs into the lungs as needed.          Bertram Gala Glycol-Propyl Glycol (SYSTANE) 0.4-0.3 % SOLN   Ophthalmic   Apply to eye.         .  potassium chloride SA (K-DUR,KLOR-CON) 20 MEQ tablet      1 TABLET PO BID   60 tablet   6   . predniSONE (DELTASONE) 5 MG tablet   Oral   Take 7.5 mg by mouth. Take 1.5 tablets         . traMADol (ULTRAM) 50 MG tablet   Oral   Take 50 mg by mouth 3 (three) times daily as needed.         . VENTOLIN HFA 108 (90 BASE) MCG/ACT inhaler      2 puffs every 6 (six) hours as needed.          . verapamil (COVERA HS) 240 MG (CO) 24 hr tablet   Oral   Take 240 mg by mouth at bedtime.          BP 111/45  Temp(Src) 97.6 F (36.4 C)  Resp 19  SpO2 97% Physical Exam  Nursing note and vitals reviewed. Constitutional: She is oriented to person, place, and time. She appears well-developed and well-nourished. No distress.  HENT:  Head: Normocephalic and atraumatic.  Right Ear: External ear normal.  Left Ear: External ear normal.  Nose: Nose normal.  Mouth/Throat: Oropharynx is clear and moist. No oropharyngeal exudate.  Eyes: EOM are normal. Pupils are equal, round, and reactive to light.  Neck: Normal range of motion. Neck supple. No tracheal deviation present.  Cardiovascular: Bradycardia present.   Pulmonary/Chest: Effort normal and breath sounds normal. No stridor. No respiratory distress. She has no wheezes. She has no rales.  Abdominal: Soft. She exhibits no distension. There is no tenderness. There is no rebound.  Musculoskeletal: Normal range of motion.  Neurological: She is alert and oriented to person, place, and time.  Skin: Skin is warm and dry. She is not diaphoretic.    ED Course  Procedures (including critical care time) Labs Review Labs Reviewed  CBC WITH DIFFERENTIAL - Abnormal; Notable for the following:    WBC 12.8 (*)    RBC 3.12 (*)    Hemoglobin 10.2 (*)    HCT 32.2 (*)    MCV 103.2 (*)    RDW 17.4 (*)    Neutrophils Relative %  83 (*)    Neutro Abs 10.7 (*)    All other components within normal limits  COMPREHENSIVE METABOLIC PANEL - Abnormal;  Notable for the following:    Potassium 5.3 (*)    Glucose, Bld 215 (*)    BUN 25 (*)    Creatinine, Ser 1.75 (*)    Albumin 3.4 (*)    AST 106 (*)    ALT 169 (*)    GFR calc non Af Amer 26 (*)    GFR calc Af Amer 30 (*)    All other components within normal limits  URINALYSIS, ROUTINE W REFLEX MICROSCOPIC - Abnormal; Notable for the following:    APPearance HAZY (*)    Ketones, ur 15 (*)    Protein, ur 100 (*)    All other components within normal limits  URINE MICROSCOPIC-ADD ON - Abnormal; Notable for the following:    Bacteria, UA FEW (*)    Casts HYALINE CASTS (*)    All other components within normal limits  TROPONIN I   Imaging Review No results found.  EKG Interpretation    Date/Time:  Tuesday November 17 2013 15:29:51 EST Ventricular Rate:  47 PR Interval:    QRS Duration: 131 QT Interval:  501 QTC Calculation: 443 R Axis:   -32 Text Interpretation:  Junctional rhythm Left bundle branch block non-specific T wave inversion isolated to lead III Confirmed by HARRISON  MD, FORREST ([id number]) on 11/17/2013 3:56:16 PM            MDM   1. AKI (acute kidney injury)   2. Bradycardia   3. Hyperkalemia   4. Leukocytosis   5. Hyperglycemia     Monica Blankenship is a 79 y.o. female who presents to the emergency department for concern of generalized fatigue and no other real localizing symptoms.  Exam with sinus bradycardia, normotension, and not other abnormalities.  Basic workup initiated and patient with AKI and mild hyperkalemia.  Patient will need admission for monitoring, rehydration, and observation.  Hospitalists consulted.  Patient safe for admission to telemetry.  Patient admitted.    Arloa Koh, MD 11/17/13 1949

## 2013-11-17 NOTE — Consult Note (Signed)
Admit date: 11/17/2013 Referring Physician  Dr. Betti Cruz Primary Physician  Dr. Donette Larry Primary Cardiologist  Dr. Mayford Knife Reason for Consultation  Weaknes  HPI: Monica Blankenship is a 79 y.o. African American female with history of chronic diastolic congestive heart failure, anxiety, insomnia, osteoarthritis, anemia, chronic kidney disease stage II, aortic stenosis and regurgitation, hypertension, and obesity presents with generalized weakness. Patient was restarted on Lasix last month, She had labs this morning for routine followup. On the way to her home, she developed back pain, shoulder pain, and right arm pain. EMS was called, they found the patient to be slightly confused. They also found the patient to be hypotensive with blood pressure of 80s over 60s and bradycardia with heart rate in the 40s. She was brought to the emergency department for further evaluation. In the emergency department patient received some IV fluids with improvement in her blood pressure. Her creatinine was 1.75. Hospitalist service was asked by the patient for further care and management. Denies any recent fevers, chills, chest pain, shortness of breath, abdominal pain, diarrhea, headaches or vision changes. Patient did complain of some nausea. Cardiology is now asked to evaluate.     PMH:   Past Medical History  Diagnosis Date  . Allergy   . CHF (congestive heart failure)   . Anxiety   . Insomnia   . Osteoarthritis   . Anemia   . Fatigue   . Obesity   . Diastolic dysfunction   . CKD (chronic kidney disease)   . Mitral stenosis   . Aortic stenosis   . Aortic regurgitation   . HTN (hypertension)      PSH:   Past Surgical History  Procedure Laterality Date  . Eye surgery    . Abdominal hysterectomy    . Appendectomy    . Cataract extraction, bilateral      Allergies:  Review of patient's allergies indicates no known allergies. Prior to Admit Meds:   Prescriptions prior to admission  Medication Sig  Dispense Refill  . ALPRAZolam (XANAX) 0.25 MG tablet Take 0.25 mg by mouth 2 (two) times daily as needed for anxiety.       Marland Kitchen aspirin 81 MG chewable tablet Chew 81 mg by mouth daily.      . calcium gluconate 500 MG tablet Take 500 mg by mouth 2 (two) times daily.      . cholecalciferol (VITAMIN D) 1000 UNITS tablet Take 1,000 Units by mouth daily.      Marland Kitchen doxazosin (CARDURA) 8 MG tablet Take 8 mg by mouth at bedtime.      . ferrous sulfate 325 (65 FE) MG tablet Take 325 mg by mouth daily with breakfast.      . folic acid (FOLVITE) 1 MG tablet Take 1 mg by mouth daily.      . furosemide (LASIX) 40 MG tablet Take 40 mg by mouth 2 (two) times daily.      . hydroxychloroquine (PLAQUENIL) 200 MG tablet Take 200 mg by mouth 2 (two) times daily.      Marland Kitchen loratadine (CLARITIN) 10 MG tablet Take 10 mg by mouth as needed for allergies.       . methotrexate (RHEUMATREX) 2.5 MG tablet Take 2.5 mg by mouth once a week. Caution:Chemotherapy. Protect from light.      . metoprolol tartrate (LOPRESSOR) 25 MG tablet Take 1 tablet (25 mg total) by mouth 2 (two) times daily.      . mometasone-formoterol (DULERA) 100-5 MCG/ACT AERO Inhale 2 puffs  into the lungs as needed for wheezing or shortness of breath.       Bertram Gala Glycol-Propyl Glycol (SYSTANE) 0.4-0.3 % SOLN Apply 1 drop to eye as needed (for dry eyes.).       Marland Kitchen potassium chloride (K-DUR) 10 MEQ tablet Take 10 mEq by mouth daily.      . potassium chloride SA (K-DUR,KLOR-CON) 20 MEQ tablet Take 20 mEq by mouth 2 (two) times daily.      . predniSONE (DELTASONE) 5 MG tablet Take 7.5 mg by mouth. Take 1.5 tablets      . traMADol (ULTRAM) 50 MG tablet Take 50 mg by mouth 3 (three) times daily as needed for moderate pain.       . VENTOLIN HFA 108 (90 BASE) MCG/ACT inhaler Inhale 2 puffs into the lungs every 6 (six) hours as needed for wheezing or shortness of breath.       . verapamil (COVERA HS) 240 MG (CO) 24 hr tablet Take 240 mg by mouth at bedtime.        Fam HX:    Family History  Problem Relation Age of Onset  . Hypertension Mother   . CVA Mother    Social HX:    History   Social History  . Marital Status: Married    Spouse Name: N/A    Number of Children: N/A  . Years of Education: N/A   Occupational History  . Not on file.   Social History Main Topics  . Smoking status: Former Smoker -- 0.25 packs/day for 1 years  . Smokeless tobacco: Not on file     Comment: smoked 1-3 cigs/day in school for about a year some days only  . Alcohol Use: No  . Drug Use: No  . Sexual Activity: Not on file   Other Topics Concern  . Not on file   Social History Narrative  . No narrative on file     ROS:  All 11 ROS were addressed and are negative except what is stated in the HPI  Physical Exam: Blood pressure 152/68, pulse 50, temperature 97.8 F (36.6 C), temperature source Oral, resp. rate 16, height 5\' 4"  (1.626 m), weight 182 lb 5.1 oz (82.7 kg), SpO2 97.00%.    General: Well developed, well nourished, in no acute distress Head: Eyes PERRLA, No xanthomas.   Normal cephalic and atramatic  Lungs:   Clear bilaterally to auscultation and percussion. Heart:   HRRR S1 S2 Pulses are 2+ & equal.            No carotid bruit. No JVD.  No abdominal bruits. No femoral bruits. Abdomen: Bowel sounds are positive, abdomen soft and non-tender without masses Extremities:   No clubbing, cyanosis or edema.  DP +1 Neuro: Alert and oriented X 3. Psych:  Good affect, responds appropriately    Labs:   Lab Results  Component Value Date   WBC 12.8* 11/17/2013   HGB 10.2* 11/17/2013   HCT 32.2* 11/17/2013   MCV 103.2* 11/17/2013   PLT 175 11/17/2013    Recent Labs Lab 11/17/13 1604  NA 136  K 5.3*  CL 104  CO2 22  BUN 25*  CREATININE 1.75*  CALCIUM 9.7  PROT 6.4  BILITOT 0.7  ALKPHOS 77  ALT 169*  AST 106*  GLUCOSE 215*   No results found for this basename: PTT   No results found for this basename: INR, PROTIME   Lab  Results  Component Value Date   TROPONINI <  0.30 11/17/2013         Radiology:  Dg Chest 2 View  11/17/2013   CLINICAL DATA:  Weakness, bradycardia and hypertension.  EXAM: CHEST  2 VIEW  COMPARISON:  10/14/2013 and 09/04/2003  FINDINGS: Two views of the chest demonstrate mild enlargement of the cardiac silhouette which is unchanged. Again noted are prominent interstitial lung markings which are chronic. No definite pleural effusions. Mild degenerative changes in the thoracic spine. No evidence for focal airspace disease. Elevation of the humeral heads suggests chronic rotator cuff disease.  IMPRESSION: Chronic lung changes with prominent interstitial lung markings. Findings are chronic. No evidence for acute disease.   Electronically Signed   By: Richarda Overlie M.D.   On: 11/17/2013 17:33   Ct Head Wo Contrast  11/17/2013   CLINICAL DATA:  Episode of slurred speech earlier, history CHF, hypertension, chronic kidney disease, valvular heart disease  EXAM: CT HEAD WITHOUT CONTRAST  TECHNIQUE: Contiguous axial images were obtained from the base of the skull through the vertex without intravenous contrast.  COMPARISON:  None  FINDINGS: Generalized atrophy.  Normal ventricular morphology.  No midline shift or mass effect.  Small vessel chronic ischemic changes of deep cerebral white matter.  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  No intracranial hemorrhage, mass lesion, or evidence acute infarction.  No extra-axial fluid collections.  Bones and sinuses unremarkable.  Minimal atherosclerotic calcification of the carotid siphons.  IMPRESSION: Atrophy with small vessel chronic ischemic changes of deep cerebral white matter.  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  No acute intracranial abnormalities.   Electronically Signed   By: Ulyses Southward M.D.   On: 11/17/2013 17:08    EKG:  junctional bradycardia with nonspecific ST abnormality  ASSESSMENT:  1.  Generalized weakness with  hypotension which resolved with IVF resuscitation 2.  Chronic diastolic CHF 3.  Acute on chronic CKD stage II - secondary to diuretics 4.  Mild Aortic stenosis with mild to moderate AI 5.  HTN 6.  Moderate MR 7.  Bradycardia  PLAN:   1.  Stop beta blocker and verapamil for now and if bradycardia resolves then restart beta blocker as HR tolerates 2.  Stop diuretic and potassium 3.  Hold doxazosin for now  Quintella Reichert, MD  11/17/2013  9:17 PM

## 2013-11-18 ENCOUNTER — Encounter: Payer: Self-pay | Admitting: General Surgery

## 2013-11-18 ENCOUNTER — Encounter (HOSPITAL_COMMUNITY): Payer: Self-pay | Admitting: General Practice

## 2013-11-18 DIAGNOSIS — E860 Dehydration: Secondary | ICD-10-CM

## 2013-11-18 LAB — CBC
HCT: 31.8 % — ABNORMAL LOW (ref 36.0–46.0)
MCH: 33.4 pg (ref 26.0–34.0)
Platelets: 161 10*3/uL (ref 150–400)
RBC: 3.11 MIL/uL — ABNORMAL LOW (ref 3.87–5.11)
RDW: 17.4 % — ABNORMAL HIGH (ref 11.5–15.5)
WBC: 11.6 10*3/uL — ABNORMAL HIGH (ref 4.0–10.5)

## 2013-11-18 LAB — BASIC METABOLIC PANEL
Calcium: 9.3 mg/dL (ref 8.4–10.5)
GFR calc Af Amer: 38 mL/min — ABNORMAL LOW (ref >90)
GFR calc non Af Amer: 33 mL/min — ABNORMAL LOW (ref >90)
Sodium: 136 mEq/L (ref 135–145)

## 2013-11-18 LAB — TROPONIN I: Troponin I: <0.3 ng/mL (ref <0.30)

## 2013-11-18 MED ORDER — METOPROLOL TARTRATE 25 MG PO TABS
25.0000 mg | ORAL_TABLET | Freq: Two times a day (BID) | ORAL | Status: DC
  Administered 2013-11-18 (×2): 25 mg via ORAL
  Filled 2013-11-18 (×4): qty 1

## 2013-11-18 MED ORDER — SODIUM CHLORIDE 0.9 % IV SOLN: INTRAVENOUS | Status: AC

## 2013-11-18 MED ORDER — DOXAZOSIN MESYLATE 8 MG PO TABS
8.0000 mg | ORAL_TABLET | Freq: Every day | ORAL | Status: DC
  Administered 2013-11-18: 8 mg via ORAL
  Filled 2013-11-18 (×2): qty 1

## 2013-11-18 MED ORDER — FUROSEMIDE 40 MG PO TABS: 40.0000 mg | ORAL_TABLET | Freq: Every day | ORAL | Status: DC

## 2013-11-18 NOTE — Progress Notes (Addendum)
admit  To  Room  6 e 21   And tele box  21  Placed  Orientation  To  Room  Call bell  Safety plan  ,instructed on use of bed alarm x24 hrs Mental  Status alert and oeiented  x4 Living arrangements home with spouse and  son Skin  intact Family  In attendance  Daughter in town visiting from  [place]  See  Doc flow  sheet

## 2013-11-18 NOTE — Progress Notes (Signed)
Subjective: Pt feel better No CP, no SOB  Objective: Vital signs in last 24 hours: Temp:  [97.6 F (36.4 C)-98.6 F (37 C)] 98.6 F (37 C) (12/17 0600) Pulse Rate:  [42-87] 87 (12/17 0600) Resp:  [14-21] 16 (12/17 0600) BP: (111-167)/(38-88) 167/88 mmHg (12/17 0600) SpO2:  [96 %-98 %] 96 % (12/17 0600) Weight:  [82.7 kg (182 lb 5.1 oz)] 82.7 kg (182 lb 5.1 oz) (12/16 2049) Weight change:  Last BM Date: 11/17/13  Intake/Output from previous day:   Intake/Output this shift:    General appearance: alert Resp: clear to auscultation bilaterally Cardio: regular rate and rhythm Extremities: extremities normal, atraumatic, no cyanosis or edema  Lab Results:  Recent Labs  11/17/13 1604 11/18/13 0300  WBC 12.8* 11.6*  HGB 10.2* 10.4*  HCT 32.2* 31.8*  PLT 175 161   BMET  Recent Labs  11/17/13 1604 11/18/13 0300  NA 136 136  K 5.3* 5.5*  CL 104 105  CO2 22 19  GLUCOSE 215* 94  BUN 25* 26*  CREATININE 1.75* 1.43*  CALCIUM 9.7 9.3    Studies/Results: Dg Chest 2 View  11/17/2013   CLINICAL DATA:  Weakness, bradycardia and hypertension.  EXAM: CHEST  2 VIEW  COMPARISON:  10/14/2013 and 09/04/2003  FINDINGS: Two views of the chest demonstrate mild enlargement of the cardiac silhouette which is unchanged. Again noted are prominent interstitial lung markings which are chronic. No definite pleural effusions. Mild degenerative changes in the thoracic spine. No evidence for focal airspace disease. Elevation of the humeral heads suggests chronic rotator cuff disease.  IMPRESSION: Chronic lung changes with prominent interstitial lung markings. Findings are chronic. No evidence for acute disease.   Electronically Signed   By: Richarda Overlie M.D.   On: 11/17/2013 17:33   Ct Head Wo Contrast  11/17/2013   CLINICAL DATA:  Episode of slurred speech earlier, history CHF, hypertension, chronic kidney disease, valvular heart disease  EXAM: CT HEAD WITHOUT CONTRAST  TECHNIQUE: Contiguous  axial images were obtained from the base of the skull through the vertex without intravenous contrast.  COMPARISON:  None  FINDINGS: Generalized atrophy.  Normal ventricular morphology.  No midline shift or mass effect.  Small vessel chronic ischemic changes of deep cerebral white matter.  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  No intracranial hemorrhage, mass lesion, or evidence acute infarction.  No extra-axial fluid collections.  Bones and sinuses unremarkable.  Minimal atherosclerotic calcification of the carotid siphons.  IMPRESSION: Atrophy with small vessel chronic ischemic changes of deep cerebral white matter.  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  No acute intracranial abnormalities.   Electronically Signed   By: Ulyses Southward M.D.   On: 11/17/2013 17:08    Medications: I have reviewed the patient's current medications.  Assessment/Plan: Mudlogger hypotension/ dehyration- lab , urine, ct head, cxr -ok Improved with IVF- continue gentle hydration today- cr better; hold lasix HR better- off VErapamil; restart lopressor Troponin negative HTN- restart on cardura RA- on Plaqunil; prednisone A/C renal failure CR- 1.2-1.3 baseline  LOS: 1 day   Monica Blankenship 11/18/2013, 7:25 AM

## 2013-11-18 NOTE — Progress Notes (Signed)
SUBJECTIVE:  Feels better  OBJECTIVE:   Vitals:   Filed Vitals:   11/18/13 0600 11/18/13 0938 11/18/13 1400 11/18/13 1712  BP: 167/88 172/78 146/70 156/81  Pulse: 87 83 78 74  Temp: 98.6 F (37 C) 98.8 F (37.1 C) 98 F (36.7 C) 98 F (36.7 C)  TempSrc: Oral Oral Oral Oral  Resp: 16 18 18 18   Height:      Weight:      SpO2: 96% 98% 98% 95%   I&O's:   Intake/Output Summary (Last 24 hours) at 11/18/13 1923 Last data filed at 11/18/13 1800  Gross per 24 hour  Intake   1245 ml  Output      0 ml  Net   1245 ml        PHYSICAL EXAM General: Well developed, well nourished, in no acute distress Head:    Normal cephalic and atramatic  Lungs:   Clear bilaterally to auscultation and percussion. Heart:  HRRR S1 S2  No JVD.     Extremities:  No edema.   Neuro: Alert and oriented Psych:  normal affect, responds appropriately   LABS: Basic Metabolic Panel:  Recent Labs  16/10/96 1604 11/18/13 0300  NA 136 136  K 5.3* 5.5*  CL 104 105  CO2 22 19  GLUCOSE 215* 94  BUN 25* 26*  CREATININE 1.75* 1.43*  CALCIUM 9.7 9.3   Liver Function Tests:  Recent Labs  11/17/13 1604  AST 106*  ALT 169*  ALKPHOS 77  BILITOT 0.7  PROT 6.4  ALBUMIN 3.4*   No results found for this basename: LIPASE, AMYLASE,  in the last 72 hours CBC:  Recent Labs  11/17/13 1604 11/18/13 0300  WBC 12.8* 11.6*  NEUTROABS 10.7*  --   HGB 10.2* 10.4*  HCT 32.2* 31.8*  MCV 103.2* 102.3*  PLT 175 161   Cardiac Enzymes:  Recent Labs  11/17/13 1604 11/17/13 2215 11/18/13 0300  TROPONINI <0.30 <0.30 <0.30   BNP: No components found with this basename: POCBNP,  D-Dimer: No results found for this basename: DDIMER,  in the last 72 hours Hemoglobin A1C: No results found for this basename: HGBA1C,  in the last 72 hours Fasting Lipid Panel: No results found for this basename: CHOL, HDL, LDLCALC, TRIG, CHOLHDL, LDLDIRECT,  in the last 72 hours Thyroid Function Tests: No results  found for this basename: TSH, T4TOTAL, FREET3, T3FREE, THYROIDAB,  in the last 72 hours Anemia Panel: No results found for this basename: VITAMINB12, FOLATE, FERRITIN, TIBC, IRON, RETICCTPCT,  in the last 72 hours Coag Panel:   No results found for this basename: INR, PROTIME    RADIOLOGY: Dg Chest 2 View  11/17/2013   CLINICAL DATA:  Weakness, bradycardia and hypertension.  EXAM: CHEST  2 VIEW  COMPARISON:  10/14/2013 and 09/04/2003  FINDINGS: Two views of the chest demonstrate mild enlargement of the cardiac silhouette which is unchanged. Again noted are prominent interstitial lung markings which are chronic. No definite pleural effusions. Mild degenerative changes in the thoracic spine. No evidence for focal airspace disease. Elevation of the humeral heads suggests chronic rotator cuff disease.  IMPRESSION: Chronic lung changes with prominent interstitial lung markings. Findings are chronic. No evidence for acute disease.   Electronically Signed   By: Richarda Overlie M.D.   On: 11/17/2013 17:33   Ct Head Wo Contrast  11/17/2013   CLINICAL DATA:  Episode of slurred speech earlier, history CHF, hypertension, chronic kidney disease, valvular heart disease  EXAM: CT  HEAD WITHOUT CONTRAST  TECHNIQUE: Contiguous axial images were obtained from the base of the skull through the vertex without intravenous contrast.  COMPARISON:  None  FINDINGS: Generalized atrophy.  Normal ventricular morphology.  No midline shift or mass effect.  Small vessel chronic ischemic changes of deep cerebral white matter.  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  No intracranial hemorrhage, mass lesion, or evidence acute infarction.  No extra-axial fluid collections.  Bones and sinuses unremarkable.  Minimal atherosclerotic calcification of the carotid siphons.  IMPRESSION: Atrophy with small vessel chronic ischemic changes of deep cerebral white matter.  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  No acute  intracranial abnormalities.   Electronically Signed   By: Ulyses Southward M.D.   On: 11/17/2013 17:08      ASSESSMENT:  HTN, ARF  PLAN:  BP better.  May need to restart BP meds.    Cr imprvoing.  Corky Crafts., MD  11/18/2013  7:23 PM

## 2013-11-18 NOTE — Discharge Summary (Signed)
Physician Discharge Summary  Patient ID: Monica Blankenship MRN: [id number] DOB/AGE: August 07, 1929 79 y.o.  Admit date: 11/17/2013 Discharge date: 11/19/2013  Admission Diagnoses: Weakness/hypotension/dehydration Huston Foley arrhythmia Acute renal failure  Discharge Diagnoses:  Active Problems: Acute kidney injury   Bradycardia   Hypotension   Acute renal failure   Dehydration   Anemia   Anxiety   Osteoarthritis   Obesity   CKD (chronic kidney disease)   Aortic stenosis   Aortic regurgitation   Chronic diastolic CHF (congestive heart failure)   Pulmonary HTN   HTN (hypertension)     Discharged Condition: good  Hospital Course: 79 years old female admitted with weakness;found to have hypotension and bradycardia cardia by the EMS brought into the emergency room. Problem #1: hyportension/bradycardia arrhythmia:volume resuscitation in the emergency room with improvement in the blood pressure; heart rate stabilized: patient was continued IV fluids; Lasix and her beta blocker and were up and was held-heart rate improved to baseline in the 70 to 80s, blood pressure improved; blood pressure medication restarted Cardiology was consulted: recommend continue monitoring no change in medication Workup negative for cardiac markers, normal sinus rhythm on the telemetry. Chest x-ray negative CT of the head negative, blood work-creatinine was elevated to 1.7-improved with IV hydration- 1.16; K 4.2 Urinalysis negative Problem #2 hypertension: restart back blood pressure medications. Problem #3 rheumatoid arthritis continue her methotrexate; continue with prednisone; Plaquenil new medication. Problem #4: acute renal failure due to dehydration; Lasix was held IV fluid-with improvement in the renal function Restart Lasix: low-dose-1/2 tablet once a day.  Consults: None  Significant Diagnostic Studies: labs: lcr 1.16; K 4.2; blood count normal, troponin neg.; u/a neg and radiology: CXR: normal and CT  scan: head neg  Treatments: IV hydration  Discharge Exam: Blood pressure 156/81, pulse 74, temperature 98 F (36.7 C), temperature source Oral, resp. rate 18, height 5\' 4"  (1.626 m), weight 82.7 kg (182 lb 5.1 oz), SpO2 95.00%. General appearance: alert Resp: clear to auscultation bilaterally Cardio: regular rate and rhythm Extremities: extremities normal, atraumatic, no cyanosis or edema  Disposition: Final discharge disposition not confirmed   Future Appointments Provider Department Dept Phone   12/10/2013 11:30 AM [facility] 6 Min[facility] [phone]   12/10/2013 12:00 PM [facility] P[facility] Room [facility] [phone]   12/15/2013 8:00 PM [facility] 3 [facility] [phone]   01/26/2014 10:15 AM Quintella Reichert, MD [facility] [phone]       Medication List    STOP taking these medications       potassium chloride SA 20 MEQ tablet  Commonly known as:  K-DUR,KLOR-CON      TAKE these medications       ALPRAZolam 0.25 MG tablet  Commonly known as:  XANAX  Take 0.25 mg by mouth 2 (two) times daily as needed for anxiety.     aspirin 81 MG chewable tablet  Chew 81 mg by mouth daily.     calcium gluconate 500 MG tablet  Take 500 mg by mouth 2 (two) times daily.     cholecalciferol 1000 UNITS tablet  Commonly known as:  VITAMIN D  Take 1,000 Units by mouth daily.     doxazosin 8 MG tablet  Commonly known as:  CARDURA  Take 8 mg by mouth at bedtime.     ferrous sulfate 325 (65 FE) MG tablet  Take 325 mg by mouth daily with breakfast.     folic acid 1 MG tablet  Commonly known as:  FOLVITE  Take 1 mg by mouth daily.     furosemide 40 MG tablet  Commonly known as:  LASIX  Take 1 tablet (40 mg total) by mouth daily.     hydroxychloroquine 200 MG tablet  Commonly known as:  PLAQUENIL  Take 200 mg by mouth 2 (two) times daily.     loratadine 10 MG tablet  Commonly known as:  CLARITIN   Take 10 mg by mouth as needed for allergies.     methotrexate 2.5 MG tablet  Commonly known as:  RHEUMATREX  Take 2.5 mg by mouth once a week. Caution:Chemotherapy. Protect from light.     metoprolol tartrate 25 MG tablet  Commonly known as:  LOPRESSOR  Take 1 tablet (25 mg total) by mouth 2 (two) times daily.     mometasone-formoterol 100-5 MCG/ACT Aero  Commonly known as:  DULERA  Inhale 2 puffs into the lungs as needed for wheezing or shortness of breath.     potassium chloride 10 MEQ tablet  Commonly known as:  K-DUR  Take 10 mEq by mouth daily.     predniSONE 5 MG tablet  Commonly known as:  DELTASONE  Take 7.5 mg by mouth. Take 1.5 tablets     SYSTANE 0.4-0.3 % Soln  Generic drug:  Polyethyl Glycol-Propyl Glycol  Apply 1 drop to eye as needed (for dry eyes.).     traMADol 50 MG tablet  Commonly known as:  ULTRAM  Take 50 mg by mouth 3 (three) times daily as needed for moderate pain.     VENTOLIN HFA 108 (90 BASE) MCG/ACT inhaler  Generic drug:  albuterol  Inhale 2 puffs into the lungs every 6 (six) hours as needed for wheezing or shortness of breath.     verapamil 240 MG (CO) 24 hr tablet  Commonly known as:  COVERA HS  Take 240 mg by mouth at bedtime.         SignedGeorgann Housekeeper 11/18/2013, 8:46 PM

## 2013-11-18 NOTE — Progress Notes (Signed)
UR completed 

## 2013-11-18 NOTE — ED Provider Notes (Signed)
Medical screening examination/treatment/procedure(s) were conducted as a shared visit with resident physician and myself.  I personally evaluated the patient during the encounter.   I personally reviewed and interpreted the ecg and agree with the residents interpretation.   I interviewed and examined the patient. Lungs are CTAB. Cardiac exam wnl. Abdomen soft.  Pt found to have AKI, plan for admission to hospitalist.   Junius Argyle, MD 11/18/13 1314

## 2013-11-19 LAB — BASIC METABOLIC PANEL
BUN: 18 mg/dL (ref 6–23)
CO2: 25 mEq/L (ref 19–32)
Chloride: 105 mEq/L (ref 96–112)
Creatinine, Ser: 1.16 mg/dL — ABNORMAL HIGH (ref 0.50–1.10)
GFR calc non Af Amer: 43 mL/min — ABNORMAL LOW (ref >90)
Potassium: 4 mEq/L (ref 3.5–5.1)
Sodium: 140 mEq/L (ref 135–145)

## 2013-11-19 MED ORDER — FUROSEMIDE 40 MG PO TABS: 20.0000 mg | ORAL_TABLET | Freq: Every day | ORAL | Status: DC

## 2013-11-19 NOTE — Care Management Note (Signed)
Late Entry:    CARE MANAGEMENT NOTE 11/19/2013  Patient:  [facility]   Account Number:  [id number]  Date Initiated:  11/19/2013  Documentation initiated by:  Derel Mcglasson  Subjective/Objective Assessment:   Noted order for HH,however pt has already d/c to home at 0805 am , order written at 0715.     Action/Plan:   This CM called pt home and spoke with pt daughter who wishes to use [place] for [facility] RN services.   Anticipated DC Date:  11/19/2013   Anticipated DC Plan:  HOME W HOME HEALTH SERVICES         Choice offered to / List presented to:          [facility] arranged  HH-1 RN      [facility] agency  [facility]   Status of service:  Completed, signed off Medicare Important Message given?   (If response is "NO", the following Medicare IM given date fields will be blank) Date Medicare IM given:   Date Additional Medicare IM given:    Discharge Disposition:  HOME W HOME HEALTH SERVICES  Per UR Regulation:    If discussed at Long Length of Stay Meetings, dates discussed:    Comments:

## 2013-12-08 ENCOUNTER — Telehealth: Payer: Self-pay | Admitting: Cardiology

## 2013-12-08 NOTE — Telephone Encounter (Signed)
[name] from [place] called regarding pt having gained 4 lbs overnight and whizzing, Pt denies SOB. [name] states pt has been eating can breakfast sausage , and possible that caused his weigh gain.  Pt  took an inhaler treatment yesterday and the whizzing has subsided. Pt takes Lasix 40 mg 1/2 tablet once a day. [name] wants for Dr. Mayford Knife to be aware about this.

## 2013-12-08 NOTE — Telephone Encounter (Signed)
Monica Blankenship from [place] is aware of Dr. Norris Cross recommendations to increased the lasix to 40 mg daily for 3 days and  then go back to 1/2 tablet once a day, and encourage to follow a low sodium diet and avoid sausage. Monica Blankenship is  Aware, she verbalized understanding.

## 2013-12-08 NOTE — Telephone Encounter (Signed)
New problem   Stated pt has 4 lb wt gain overnight. She did eat can breakfast sausage and notice wheezing. Please advise.

## 2013-12-08 NOTE — Telephone Encounter (Signed)
Left [name] at [facility] a detail message and to call back.

## 2013-12-08 NOTE — Telephone Encounter (Signed)
Please have patient increase his lasix to 40mg  daily for 3 days and then back to 1/2 tablet daily.  Please encouraged him to follow a low sodium and diet and avoid sausage

## 2013-12-10 ENCOUNTER — Ambulatory Visit (INDEPENDENT_AMBULATORY_CARE_PROVIDER_SITE_OTHER): Payer: Medicare PPO | Admitting: Pulmonary Disease

## 2013-12-10 DIAGNOSIS — R0600 Dyspnea, unspecified: Secondary | ICD-10-CM

## 2013-12-10 DIAGNOSIS — R0609 Other forms of dyspnea: Secondary | ICD-10-CM

## 2013-12-10 DIAGNOSIS — R0602 Shortness of breath: Secondary | ICD-10-CM

## 2013-12-10 DIAGNOSIS — R0989 Other specified symptoms and signs involving the circulatory and respiratory systems: Secondary | ICD-10-CM

## 2013-12-10 LAB — PULMONARY FUNCTION TEST
DL/VA % pred: 93 %
DL/VA: 4.24 ml/min/mmHg/L
DLCO unc % pred: 64 %
DLCO unc: 13.99 ml/min/mmHg
FEF 25-75 Post: 1.06 L/sec
FEF 25-75 Pre: 1.07 L/sec
FEF2575-%CHANGE-POST: 0 %
FEF2575-%PRED-POST: 97 %
FEF2575-%Pred-Pre: 97 %
FEV1-%CHANGE-POST: 2 %
FEV1-%PRED-POST: 97 %
FEV1-%PRED-PRE: 95 %
FEV1-PRE: 1.26 L
FEV1-Post: 1.29 L
FEV1FVC-%Change-Post: -5 %
FEV1FVC-%PRED-PRE: 103 %
FEV6-%Change-Post: 6 %
FEV6-%Pred-Post: 105 %
FEV6-%Pred-Pre: 98 %
FEV6-PRE: 1.62 L
FEV6-Post: 1.73 L
FEV6FVC-%Change-Post: -1 %
FEV6FVC-%PRED-PRE: 105 %
FEV6FVC-%Pred-Post: 103 %
FVC-%Change-Post: 8 %
FVC-%Pred-Post: 102 %
FVC-%Pred-Pre: 94 %
FVC-Post: 1.76 L
FVC-Pre: 1.62 L
POST FEV1/FVC RATIO: 73 %
POST FEV6/FVC RATIO: 98 %
PRE FEV6/FVC RATIO: 100 %
Pre FEV1/FVC ratio: 78 %
RV % pred: 65 %
RV: 1.52 L
TLC % pred: 67 %
TLC: 3.22 L

## 2013-12-10 NOTE — Progress Notes (Signed)
PFT done today. 

## 2013-12-14 ENCOUNTER — Encounter: Payer: Self-pay | Admitting: Pulmonary Disease

## 2013-12-14 ENCOUNTER — Telehealth: Payer: Self-pay

## 2013-12-14 NOTE — Telephone Encounter (Signed)
Spoke with pt, she is aware of results.  Nothing further needed at this time.  

## 2013-12-14 NOTE — Telephone Encounter (Signed)
Message copied by Velvet Bathe on Mon Dec 14, 2013  2:56 PM ------      Message from: Max Fickle B      Created: Mon Dec 14, 2013  1:28 PM       A,            Please let her know that her PFTs did not show COPD.  They showed that her lungs were restricted slightly and we need to discuss more on the next visit.            Thanks      B ------

## 2013-12-14 NOTE — Progress Notes (Signed)
PFT visit only

## 2013-12-15 ENCOUNTER — Ambulatory Visit (HOSPITAL_BASED_OUTPATIENT_CLINIC_OR_DEPARTMENT_OTHER): Payer: Medicare PPO | Attending: Pulmonary Disease | Admitting: Radiology

## 2013-12-15 VITALS — Ht 64.0 in | Wt 182.0 lb

## 2013-12-15 DIAGNOSIS — R0989 Other specified symptoms and signs involving the circulatory and respiratory systems: Secondary | ICD-10-CM | POA: Insufficient documentation

## 2013-12-15 DIAGNOSIS — G4733 Obstructive sleep apnea (adult) (pediatric): Secondary | ICD-10-CM | POA: Insufficient documentation

## 2013-12-15 DIAGNOSIS — R0602 Shortness of breath: Secondary | ICD-10-CM

## 2013-12-15 DIAGNOSIS — R0609 Other forms of dyspnea: Secondary | ICD-10-CM | POA: Insufficient documentation

## 2013-12-15 DIAGNOSIS — Z6831 Body mass index (BMI) 31.0-31.9, adult: Secondary | ICD-10-CM | POA: Insufficient documentation

## 2013-12-27 DIAGNOSIS — R0602 Shortness of breath: Secondary | ICD-10-CM

## 2013-12-27 DIAGNOSIS — G4733 Obstructive sleep apnea (adult) (pediatric): Secondary | ICD-10-CM

## 2013-12-28 ENCOUNTER — Telehealth: Payer: Self-pay | Admitting: Pulmonary Disease

## 2013-12-28 DIAGNOSIS — G4733 Obstructive sleep apnea (adult) (pediatric): Secondary | ICD-10-CM

## 2013-12-28 NOTE — Telephone Encounter (Signed)
They got her up to BIPAP 18/14 during the study but she still had a lot of central sleep apnea events apparently.    I'm going to CC Dr. Shelle Iron first to see if he wants to see her for consideration of ASV given her CHF and central apneas.  However, if he wants [place] to set up BIPAP to see how she does on this first that is fine with me.

## 2013-12-28 NOTE — Sleep Study (Signed)
   NAME: Monica Blankenship DATE OF BIRTH:  May 02, 1929 MEDICAL RECORD NUMBER [id number]  LOCATION: [facility]  PHYSICIAN: Barbaraann Share  DATE OF STUDY: 12/15/2013  SLEEP STUDY TYPE: Nocturnal Polysomnogram               REFERRING PHYSICIAN: Lupita Leash, MD  INDICATION FOR STUDY: Hypersomnia with sleep apnea  EPWORTH SLEEPINESS SCORE:  9 HEIGHT: 5\' 4"  (162.6 cm)  WEIGHT: 182 lb (82.555 kg)    Body mass index is 31.22 kg/(m^2).  NECK SIZE: 15 in.   SLEEP ARCHITECTURE: The patient had a total sleep time of 297 minutes, with no slow-wave sleep and decreased quantity of REM. Sleep onset latency was normal at 29 minutes, and REM onset was prolonged until the titration portion of the study. Sleep efficiency was 46% during the diagnostic portion of the study, and 93% during the titration portion.  RESPIRATORY DATA: The patient underwent a split night protocol where she was found to have 117 obstructive and central events in the first 121 minutes of sleep. This gave her an AHI of 58 events per hour during the diagnostic portion of the study. The events occurred in all positions, and moderate snoring was noted throughout. By protocol the patient was fitted with a small ResMed air fit F10 full face mask, and initially had pressure induced central apneas. He was changed to bilevel, and had significant increase in her central events. It was initially unclear whether the patient was over titrated, or whether she is going to need an alternative pressure device. We were unable to reach a therapeutic pressure.  OXYGEN DATA: There was oxygen desaturation as low as 85% with the patient's obstructive events.  CARDIAC DATA: Rare PVC noted.  MOVEMENT/PARASOMNIA: No significant leg jerks or other abnormal behaviors were noted.  IMPRESSION/ RECOMMENDATION:    1) split-night study reveals severe obstructive and central sleep apnea, with an AHI of 58 events per hour and oxygen  desaturation as low as 85% during the diagnostic portion of the study. The patient was then fitted with a small ResMed air fit F10 full face mask, and CPAP titration was initiated. Because of pressure induced central apneas, the patient was changed to bilevel, but this continued to be a significant issue. Therapeutic pressure levels were not able to be achieved. Given her response to CPAP and bilevel, I would suggest that she be treated with an ASV device, and will need a formal titration in the sleep Center for this. She should also be encouraged to work aggressively on weight loss.  2) rare PVC noted, but no clinically significant arrhythmias were seen.     Diplomate, American Board of Sleep Medicine  ELECTRONICALLY SIGNED ON:  12/28/2013, 2:05 PM [facility] PH: [phone]   FX: [phone] ACCREDITED BY THE AMERICAN ACADEMY OF SLEEP MEDICINE

## 2013-12-28 NOTE — Telephone Encounter (Signed)
OK, thanks [employer], please let her know she needs an ASV titration study because her sleep apnea is so severe, we can't treat it with the routine CPAP.  She will also need to see Dr. Shelle Iron at somepoint after that study.

## 2013-12-28 NOTE — Addendum Note (Signed)
Addended by: Darrell Jewel on: 12/28/2013 02:49 PM   Modules accepted: Orders

## 2013-12-28 NOTE — Telephone Encounter (Signed)
Pt daughter advised, order placed and consult set for [facility].Carron Curie, CMA

## 2013-12-28 NOTE — Telephone Encounter (Signed)
LMTCB

## 2013-12-28 NOTE — Telephone Encounter (Signed)
Monica Blankenship, will need ASV titration study at sleep center given her study.  I really think that is for the best.  Can always try bilevel titration first, but then if unsuccessful, will have to go back for ASV.  I would just do the asv and be done with it.

## 2013-12-28 NOTE — Telephone Encounter (Signed)
Spoke with daughter [name]. She reports when her sister told her when she picked pt up from sleep study on 12/15/13-pt had sleep apnea and would be set up with a machine. Was told they should receive a machine within 7-10 days. I do not see this stated in her chart. The results are scanned into pt chart. Please advise Dr. Kendrick Fries thanks

## 2014-01-03 ENCOUNTER — Ambulatory Visit (HOSPITAL_BASED_OUTPATIENT_CLINIC_OR_DEPARTMENT_OTHER): Payer: Medicare PPO | Attending: Pulmonary Disease

## 2014-01-03 VITALS — Ht 64.0 in | Wt 182.0 lb

## 2014-01-03 DIAGNOSIS — Z5320 Procedure and treatment not carried out because of patient's decision for unspecified reasons: Secondary | ICD-10-CM | POA: Insufficient documentation

## 2014-01-03 DIAGNOSIS — Z9989 Dependence on other enabling machines and devices: Secondary | ICD-10-CM

## 2014-01-03 DIAGNOSIS — G4733 Obstructive sleep apnea (adult) (pediatric): Secondary | ICD-10-CM | POA: Insufficient documentation

## 2014-01-06 ENCOUNTER — Institutional Professional Consult (permissible substitution): Payer: Medicare PPO | Admitting: Pulmonary Disease

## 2014-01-07 ENCOUNTER — Telehealth: Payer: Self-pay | Admitting: Pulmonary Disease

## 2014-01-07 ENCOUNTER — Telehealth: Payer: Self-pay | Admitting: Cardiology

## 2014-01-07 NOTE — Telephone Encounter (Signed)
New message  Home health calling  Patient Continue to retain fluid and sob . Home health continue to do education with patient , however patient is non compliance - cooking, weight management ,   blood pressure  138/70 today   Today weight  183.

## 2014-01-07 NOTE — Telephone Encounter (Signed)
Spoke with [name] with [place] States that pts SOB is worsening Pts fluid levels are increasing Pt is having difficulty using her inhalers [name] states that the patient is not grasping the concept of the instructions for the inhalers. Using spacer and is not understanding the instructions for this either.  [name] is requesting recs for meds.  Should pt have nebs instead? [name] is also concerned of the pts fluid retention. Any recs?  Please advise Dr Kendrick Fries. Thanks.

## 2014-01-07 NOTE — Telephone Encounter (Signed)
I spoke at length with Dameka the [facility] & talked with daughter [name]. HHN states that pt has had a cough last week & that she places salt on the back of her hand at bedtime to make it better. Despite education given by [facility] regarding this & meal preparation she feels that this in not intentional noncompliance but more so a barrier to learning. Pt is a retired Lawyer.  Dameka states that shortnes of breath is not as much as last week. Pt is keeping a weight log of sorts but not where she could locate it for the [facility]. She also wanted Dr. Mayford Knife to know pt refuses [facility] aide to assist her as well as refusing meals on wheels.  Pt has appointment 01/26/14 with Dr. Mayford Knife. She thought pt would be more receptive if Dr. Mayford Knife talked with her at office visit about these accomodations to help preserve pt energy & strength.  I offered an earlier appointment to daughter, but she declined b/c it was not a morning appointment. Mylo Red RN

## 2014-01-07 NOTE — Telephone Encounter (Signed)
She needs to be seen by one of [place] or her PCP

## 2014-01-07 NOTE — Telephone Encounter (Signed)
LMOM x 1 for [facility]

## 2014-01-07 NOTE — Telephone Encounter (Signed)
lmomtcb x 1 for demeka with [facility]

## 2014-01-08 ENCOUNTER — Encounter: Payer: Self-pay | Admitting: Internal Medicine

## 2014-01-08 ENCOUNTER — Ambulatory Visit (INDEPENDENT_AMBULATORY_CARE_PROVIDER_SITE_OTHER): Payer: Medicare PPO | Admitting: Internal Medicine

## 2014-01-08 VITALS — BP 152/78 | HR 82 | Temp 98.2°F | Ht 62.0 in | Wt 184.6 lb

## 2014-01-08 DIAGNOSIS — I2789 Other specified pulmonary heart diseases: Secondary | ICD-10-CM

## 2014-01-08 DIAGNOSIS — G4734 Idiopathic sleep related nonobstructive alveolar hypoventilation: Secondary | ICD-10-CM

## 2014-01-08 DIAGNOSIS — I272 Pulmonary hypertension, unspecified: Secondary | ICD-10-CM

## 2014-01-08 DIAGNOSIS — R0902 Hypoxemia: Secondary | ICD-10-CM

## 2014-01-08 NOTE — Telephone Encounter (Signed)
Spoke with [name] and advised of recs. She asked that we call pt daughter with appt. I spoke with pt daughter and appt set for today at 4:15. Carron Curie, CMA

## 2014-01-08 NOTE — Progress Notes (Signed)
Subjective:    Patient ID: Monica Blankenship, female    DOB: 02-13-1929, 80 y.o.   MRN: [id number]  HPI 12/20/13 ov / Dr Kendrick Fries  80 year old female with shortness of breath and cough. She had pertussis as a child but was never hospitalized for any respiratory illnesses. She occasionally smoked cigarettes in high school but never smoked regularly as an adult. In the last 2-3 years she's developed increasing shortness of breath and has been diagnosed with congestive heart failure which is being treated by a cardiology (Dr. Mayford Knife). She was referred to me today because a recent echocardiogram showed pulmonary hypertension.  The last several months of been rough. She had an episode of bronchitis which lasted for several weeks and eventually lead to pneumonia apparently. She was treated with multiple rounds of antibiotics for several weeks. She a lot of dyspnea during this time. She was diagnosed with chronic bronchitis during that time and was placed on an inhaler. She received breathing treatments at her doctor's office which would help and she said that the inhaler Elwin Sleight) would also help with her breathing when she would use it at home. She had fever and some sputum production during this time. Also during this time she had some leg swelling in pulmonary vascular congestion noted on chest imaging. Per my review of recent cardiology records it seems that she was treated with increasing doses of diuretics during this time. By the time she saw Dr. Mayford Knife again in November of 2014 her symptoms it started to improve yet she wasn't still quite recovered.  At this point, she says that the cough is resolved. She is no longer using the [facility]. She still feels somewhat weak but she is much better than she was several weeks ago. She is not having leg swelling or chest pain.  Her family describes significant daytime somnolence and heavy snoring at night. She has never had a sleep study.  She has had episodes of  recurrent bronchitis on an annual basis. rec Sleep study >  Pos central apnea but could not tol any mask     01/08/2014 f/u ov/Monica Blankenship re:   Prednisone 7.5 daily  Chief Complaint  Patient presents with  . Acute Visit    Pt c/o increased SOB for the past 2 wks. She states gets SOB just walking from room to room at home.    extremely difficult hx , daughter kept correcting the pt, pt appears about symptoms and chronology of events  - leg swelling correlates with worse sob but is not supposed to take any extra lasix per daughter because "she gets dehydrated"  No obvious day to day or daytime variabilty or assoc r cp or chest tightness, subjective wheeze overt sinus or hb symptoms. No unusual exp hx or h/o childhood pna/ asthma or knowledge of premature birth.  Sleeping ok without nocturnal  or early am exacerbation  of respiratory  c/o's or need for noct saba. Also denies any obvious fluctuation of symptoms with weather or environmental changes or other aggravating or alleviating factors except as outlined above   Current Medications, Allergies, Complete Past Medical History, Past Surgical History, Family History, and Social History were reviewed in [employer] record.  ROS  The following are not active complaints unless bolded sore throat, dysphagia, dental problems, itching, sneezing,  nasal congestion or excess/ purulent secretions, ear ache,   fever, chills, sweats, unintended wt loss, pleuritic or exertional cp, hemoptysis,  orthopnea pnd or leg swelling, presyncope,  palpitations, heartburn, abdominal pain, anorexia, nausea, vomiting, diarrhea  or change in bowel or urinary habits, change in stools or urine, dysuria,hematuria,  rash, arthralgias, visual complaints, headache, numbness weakness or ataxia or problems with walking or coordination,  change in mood/affect or ?memory.             Past Medical History  Diagnosis Date  . Allergy   . CHF (congestive heart  failure)   . Anxiety   . Insomnia   . Osteoarthritis   . Anemia   . Fatigue   . Obesity   . Diastolic dysfunction   . CKD (chronic kidney disease)   . Mitral stenosis   . Aortic stenosis   . Aortic regurgitation   . HTN (hypertension)           Objective:   Physical Exam   Wt Readings from Last 3 Encounters:  01/08/14 184 lb 9.6 oz (83.734 kg)  01/03/14 182 lb (82.555 kg)  12/15/13 182 lb (82.555 kg)        Gen: chronically ill appearing , no acute distress HEENT: NCAT, PERRL, EOMi, OP clear, neck supple without masses PULM: CTA B CV: RRR, prominent S4, no clear murmur, no JVD- 1-2+ pitting bilateral Lower ext AB: BS+, soft, nontender, no hsm Ext: warm,   no clubbing, no cyanosis Derm: no rash or skin breakdown Neuro: poor short term memory, no motor deficits  Multiple CXR's reviewed from October and November 2014, some pulmonary vascular congestion noted in October; I question a RML infiltrate on the 10/2013 study     Assessment & Plan:

## 2014-01-08 NOTE — Patient Instructions (Addendum)
Please see patient coordinator before you leave today  to schedule night time 02 at 2lpm   Start oxygen 2lpm at bedtime for now until you see Dr Shelle Iron.

## 2014-01-09 NOTE — Assessment & Plan Note (Signed)
-   see split night study 12/15/13 > not able to tolerate any mask per pt and daughter - Started 02 at 2lpm 01/08/14 per Dr Teddy Spike rec  I had an extended discussion with the patient and daughter today lasting 15 to 20 minutes of a 25 minute visit on the following issues:   1) the progressive fluid in legs does seem to correlate with sob and since was told not to adjust lasix per Dr Mayford Knife should call Dr Mayford Knife for all adjustments to lasix  2) Fluid and daily sob may improve just by adding noct 02   3) Further options for noct events limited if can't tol any mask > set up to review with Dr Shelle Iron 01/29/14    My additional concerns are that this patient does not appear to have cognitive ability to answer the simplest questions ? Early dementia vs baseline ? And dependent on several fm members for med rx so the simpler we keep things the better.

## 2014-01-09 NOTE — Assessment & Plan Note (Signed)
10/2013 Echo> LVEF 56-60% with grade 2 diastolic dysfunction, Mild AS/AI, mild MS/MR, LA moderately dilated, RA mild dilation, mod-severe TR, PASP , RV normal size and function. 12/2013 Full PFT> ratio normal, FEV1 1.29 (97% pred), TLC 3.22 (67.5 pred), ERV 0.25 (<0% pred?), DLCO 13.99 (64% pred) - rx noct 02 started 01/08/14  Overall pattern is most c/w diast L Ht failure with secondary PAH noting that LA >> RA but either way would likely benefit from elimination of noct hypoxemia (see sep a/p)

## 2014-01-11 ENCOUNTER — Telehealth: Payer: Self-pay | Admitting: Pulmonary Disease

## 2014-01-11 NOTE — Telephone Encounter (Signed)
Spoke with Okey Regal at [facility]. Pt was set up with oxygen on Saturday. She was questioning whether or not the pt needed a CPAP or BiPAP. Apparently pt's sleep study was sent over to [facility] already. Pt has upcoming appointment with [facility] to discuss her recent sleep study per BQ. Okey Regal was making sure that there wasn't something they missed. Nothing further was needed at this time.

## 2014-01-17 ENCOUNTER — Ambulatory Visit (INDEPENDENT_AMBULATORY_CARE_PROVIDER_SITE_OTHER): Payer: Medicare PPO | Admitting: Family Medicine

## 2014-01-17 VITALS — BP 162/70 | HR 76 | Temp 98.6°F | Resp 20 | Ht 63.0 in | Wt 181.0 lb

## 2014-01-17 DIAGNOSIS — R040 Epistaxis: Secondary | ICD-10-CM

## 2014-01-17 DIAGNOSIS — D649 Anemia, unspecified: Secondary | ICD-10-CM

## 2014-01-17 DIAGNOSIS — N189 Chronic kidney disease, unspecified: Secondary | ICD-10-CM

## 2014-01-17 DIAGNOSIS — I272 Pulmonary hypertension, unspecified: Secondary | ICD-10-CM

## 2014-01-17 DIAGNOSIS — G4734 Idiopathic sleep related nonobstructive alveolar hypoventilation: Secondary | ICD-10-CM

## 2014-01-17 DIAGNOSIS — I5032 Chronic diastolic (congestive) heart failure: Secondary | ICD-10-CM

## 2014-01-17 LAB — POCT CBC
Granulocyte percent: 68.5 %G (ref 37–80)
HCT, POC: 35.2 % — AB (ref 37.7–47.9)
Hemoglobin: 10.8 g/dL — AB (ref 12.2–16.2)
LYMPH, POC: 2 (ref 0.6–3.4)
MCH, POC: 32.2 pg — AB (ref 27–31.2)
MCHC: 30.7 g/dL — AB (ref 31.8–35.4)
MCV: 105.2 fL — AB (ref 80–97)
MID (CBC): 0.6 (ref 0–0.9)
MPV: 8.4 fL (ref 0–99.8)
PLATELET COUNT, POC: 171 10*3/uL (ref 142–424)
POC GRANULOCYTE: 5.7 (ref 2–6.9)
POC LYMPH %: 24.2 % (ref 10–50)
POC MID %: 7.3 % (ref 0–12)
RBC: 3.35 M/uL — AB (ref 4.04–5.48)
RDW, POC: 18.9 %
WBC: 8.3 10*3/uL (ref 4.6–10.2)

## 2014-01-17 NOTE — Patient Instructions (Signed)
1. Recommend humidifier in bedroom. 2. Recommend using nasal saline spray 2-3 times daily. 3.  Apply Vaseline to nose twice daily for the next month. 4. Return if nosebleed lasts > 30 minutes. 5. If you have a recurrent nosebleed, apply pressure and ice.  Nosebleed Nosebleeds can be caused by many conditions including trauma, infections, polyps, foreign bodies, dry mucous membranes or climate, medications and air conditioning. Most nosebleeds occur in the front of the nose. It is because of this location that most nosebleeds can be controlled by pinching the nostrils gently and continuously. Do this for at least 10 to 20 minutes. The reason for this long continuous pressure is that you must hold it long enough for the blood to clot. If during that 10 to 20 minute time period, pressure is released, the process may have to be started again. The nosebleed may stop by itself, quit with pressure, need concentrated heating (cautery) or stop with pressure from packing. HOME CARE INSTRUCTIONS   If your nose was packed, try to maintain the pack inside until your caregiver removes it. If a gauze pack was used and it starts to fall out, gently replace or cut the end off. Do not cut if a balloon catheter was used to pack the nose. Otherwise, do not remove unless instructed.  Avoid blowing your nose for 12 hours after treatment. This could dislodge the pack or clot and start bleeding again.  If the bleeding starts again, sit up and bending forward, gently pinch the front half of your nose continuously for 20 minutes.  If bleeding was caused by dry mucous membranes, cover the inside of your nose every morning with a petroleum or antibiotic ointment. Use your little fingertip as an applicator. Do this as needed during dry weather. This will keep the mucous membranes moist and allow them to heal.  Maintain humidity in your home by using less air conditioning or using a humidifier.  Do not use aspirin or  medications which make bleeding more likely. Your caregiver can give you recommendations on this.  Resume normal activities as able but try to avoid straining, lifting or bending at the waist for several days.  If the nosebleeds become recurrent and the cause is unknown, your caregiver may suggest laboratory tests. SEEK IMMEDIATE MEDICAL CARE IF:   Bleeding recurs and cannot be controlled.  There is unusual bleeding from or bruising on other parts of the body.  You have a fever.  Nosebleeds continue.  There is any worsening of the condition which originally brought you in.  You become lightheaded, feel faint, become sweaty or vomit blood. MAKE SURE YOU:   Understand these instructions.  Will watch your condition.  Will get help right away if you are not doing well or get worse. Document Released: 08/29/2005 Document Revised: 02/11/2012 Document Reviewed: 10/21/2009 [facility] Patient Information 2014 [place], [place].

## 2014-01-17 NOTE — Progress Notes (Signed)
Subjective:    Patient ID: Monica Blankenship, female    DOB: 1929/03/21, 80 y.o.   MRN: [id number] This chart was scribed for Ethelda Chick, MD by Danella Maiers, ED Scribe. This patient was seen in room 11 and the patient's care was started at 10:15 AM.  Chief Complaint  Patient presents with  . Epistaxis    started yesterday am, but today was worse    HPI HPI Comments: Monica Blankenship is a 80 y.o. female who presents to the [facility] complaining of intermittent epistaxis to the left nare since 8 am yesterday morning after starting on home oxygen seven days ago for pulmonary HTN; she is followed by pulmonology. She started on 2L home oxygen at night one week ago. She used it 2 nights ago but not last night. She reports one episode yesterday morning around 8am that bled for about 5 minutes. She reports a second episode at 7 this morning. She applied ice to her nose and it stopped. She has been diagnosed with the flu, bronchitis, and pneumonia since January. The pneumonia resolved last month. She is being followed by a pulmonologist. She states her cold symptoms returned 2 weeks ago with cough and congestion but are mostly resolved. She denies headache, dizziness. She is not on any blood thinners other than ASA daily.  Past Medical History  Diagnosis Date  . Allergy   . CHF (congestive heart failure)   . Anxiety   . Insomnia   . Osteoarthritis   . Anemia   . Fatigue   . Obesity   . Diastolic dysfunction   . CKD (chronic kidney disease)   . Mitral stenosis   . Aortic stenosis   . Aortic regurgitation   . HTN (hypertension)    Current Outpatient Prescriptions on File Prior to Visit  Medication Sig Dispense Refill  . ALPRAZolam (XANAX) 0.25 MG tablet Take 0.25 mg by mouth 2 (two) times daily as needed for anxiety.       Marland Kitchen aspirin 81 MG chewable tablet Chew 81 mg by mouth daily.      . calcium gluconate 500 MG tablet Take 500 mg by mouth 2 (two) times daily.       . cholecalciferol (VITAMIN D) 1000 UNITS tablet Take 1,000 Units by mouth daily.      Marland Kitchen doxazosin (CARDURA) 8 MG tablet Take 8 mg by mouth at bedtime.      . ferrous sulfate 325 (65 FE) MG tablet Take 325 mg by mouth daily with breakfast.      . folic acid (FOLVITE) 1 MG tablet Take 1 mg by mouth daily.      . furosemide (LASIX) 40 MG tablet Take 0.5 tablets (20 mg total) by mouth daily.  30 tablet  0  . loratadine (CLARITIN) 10 MG tablet Take 10 mg by mouth as needed for allergies.       . methotrexate (RHEUMATREX) 2.5 MG tablet Caution:Chemotherapy. Protect from light. Take as directed per Dr. Nickola Major      . metoprolol tartrate (LOPRESSOR) 25 MG tablet Take 1 tablet (25 mg total) by mouth 2 (two) times daily.      . mometasone-formoterol (DULERA) 100-5 MCG/ACT AERO Inhale 2 puffs into the lungs as needed for wheezing or shortness of breath.       Bertram Gala Glycol-Propyl Glycol (SYSTANE) 0.4-0.3 % SOLN Apply 1 drop to eye as needed (for dry eyes.).       Marland Kitchen potassium  chloride (K-DUR) 10 MEQ tablet Take 10 mEq by mouth daily.      . predniSONE (DELTASONE) 5 MG tablet Take 1.5 tablets      . traMADol (ULTRAM) 50 MG tablet Take 50 mg by mouth 3 (three) times daily as needed for moderate pain.       . VENTOLIN HFA 108 (90 BASE) MCG/ACT inhaler Inhale 2 puffs into the lungs every 6 (six) hours as needed for wheezing or shortness of breath.       . verapamil (COVERA HS) 240 MG (CO) 24 hr tablet Take 240 mg by mouth at bedtime.       No current facility-administered medications on file prior to visit.   No Known Allergies   Review of Systems  HENT: Positive for congestion, nosebleeds, postnasal drip and rhinorrhea. Negative for sinus pressure.   Respiratory: Positive for cough. Negative for shortness of breath.   Neurological: Negative for dizziness and headaches.       Objective:   Physical Exam  Nursing note and vitals reviewed. Constitutional: She is oriented to person, place, and  time. She appears well-developed and well-nourished. No distress.  HENT:  Head: Normocephalic and atraumatic.  Right Ear: External ear normal.  Left Ear: External ear normal.  Nose: No mucosal edema, rhinorrhea, nose lacerations, sinus tenderness or nasal septal hematoma.  No foreign bodies. Right sinus exhibits no maxillary sinus tenderness and no frontal sinus tenderness. Left sinus exhibits no maxillary sinus tenderness and no frontal sinus tenderness.  Mouth/Throat: Oropharynx is clear and moist.  Dried secretions in the right nare.   L nare:  Septum with 3 mm area of dried blood and superficial ulceration; L nare lateral aspect with dried residual blood; no active bleeding.  OP: no blood in OP.  Eyes: EOM are normal.  Neck: Neck supple.  Cardiovascular: Normal rate, regular rhythm and normal heart sounds.   No murmur heard. Pulmonary/Chest: Effort normal and breath sounds normal. No respiratory distress. She has no wheezes.  Musculoskeletal: Normal range of motion.  Lymphadenopathy:    She has no cervical adenopathy.  Neurological: She is alert and oriented to person, place, and time.  Skin: Skin is warm and dry.  Psychiatric: She has a normal mood and affect. Her behavior is normal.   PROCEDURE:  SEPTAL ASPECT OF L NARE SILVER NITRATE APPLIED TO SUPERFICIAL ULCERATION.  Filed Vitals:   01/17/14 0948  BP: 162/70  Pulse: 76  Temp: 98.6 F (37 C)  TempSrc: Oral  Resp: 20  Height: 5\' 3"  (1.6 m)  Weight: 181 lb (82.101 kg)  SpO2: 95%    Results for orders placed in visit on 01/17/14  POCT CBC      Result Value Ref Range   WBC 8.3  4.6 - 10.2 K/uL   Lymph, poc 2.0  0.6 - 3.4   POC LYMPH PERCENT 24.2  10 - 50 %L   MID (cbc) 0.6  0 - 0.9   POC MID % 7.3  0 - 12 %M   POC Granulocyte 5.7  2 - 6.9   Granulocyte percent 68.5  37 - 80 %G   RBC 3.35 (*) 4.04 - 5.48 M/uL   Hemoglobin 10.8 (*) 12.2 - 16.2 g/dL   HCT, POC 01/19/14 (*) 95.6 - 47.9 %   MCV 105.2 (*) 80 - 97 fL   MCH,  POC 32.2 (*) 27 - 31.2 pg   MCHC 30.7 (*) 31.8 - 35.4 g/dL   RDW, POC 18.9  Platelet Count, POC 171  142 - 424 K/uL   MPV 8.4  0 - 99.8 fL       Assessment & Plan:   1. Epistaxis   2. Anemia   3. Chronic diastolic CHF (congestive heart failure)   4. CKD (chronic kidney disease)   5. Pulmonary HTN   6. Nocturnal hypoxemia    1. L nare epistaxis:  New.  With recent nasal congestion, dried air due to winter months and excessive heat in home, recent onset of oxygen supplementation. Recommend nasal saline spray tid; recommend vaseline to B nares bid.  RTC if inability to stop bleeding. If persistent issue over the upcoming two weeks, will warrant ENT referral.  H/H stable. 2. Anemia: chronic issue for patient; stable H/H; continue iron supplement daily. 3.  CHF diastolic: stable; euvolemic today. 4.  CKD: stable. 5.  Pulmonary HTN: newly diagnosed; recently initiated on oxygen supplementation. 6.  Nocturnal hypoxemia: newly diagnosed this month; started on nighttime oxygen.  No orders of the defined types were placed in this encounter.   I personally performed the services described in this documentation, which was scribed in my presence.  The recorded information has been reviewed and is accurate.  Nilda Simmer, M.D.  [facility] [address] [place], [place]  [address] [phone] phone [phone] fax

## 2014-01-18 ENCOUNTER — Telehealth: Payer: Self-pay | Admitting: Pulmonary Disease

## 2014-01-18 ENCOUNTER — Other Ambulatory Visit: Payer: Self-pay | Admitting: Pulmonary Disease

## 2014-01-18 MED ORDER — TRAZODONE HCL 100 MG PO TABS: 50.0000 mg | ORAL_TABLET | Freq: Every day | ORAL | Status: DC

## 2014-01-18 NOTE — Telephone Encounter (Signed)
Called Ms. Farver's daughter to explain the severity of her Sleep Apnea and that we would like to re-attempt her sleep study with ASV.  Will write Rx for trazodone 50mg  qhs x1 pill for the night of the study.

## 2014-01-20 ENCOUNTER — Telehealth: Payer: Self-pay | Admitting: Pulmonary Disease

## 2014-01-20 NOTE — Telephone Encounter (Signed)
I spoke with her daughter about this at length (ten min conversation) Have her discuss with daughter please, thanks

## 2014-01-20 NOTE — Telephone Encounter (Signed)
Pt calling in regards to message on her machine. Looks as though Dr Kendrick Fries tried to reach pt or daughter on 01/18/14. (message in chart) Pt states that Dr Kendrick Fries spoke with her daughter in regards to her Sleep Apnea.  Pt would like a more indepth explanation of why her Sleep apnea is severe and why we are wanting to Re-attempt her sleep test on ASV device. Please advise Dr Kendrick Fries. Thanks.

## 2014-01-20 NOTE — Telephone Encounter (Signed)
Spoke with the pt and notified of recs per BQ  She verbalized understanding  Nothing further needed 

## 2014-01-21 ENCOUNTER — Encounter (HOSPITAL_BASED_OUTPATIENT_CLINIC_OR_DEPARTMENT_OTHER): Payer: Medicare PPO

## 2014-01-23 ENCOUNTER — Ambulatory Visit (INDEPENDENT_AMBULATORY_CARE_PROVIDER_SITE_OTHER): Payer: Medicare PPO | Admitting: Emergency Medicine

## 2014-01-23 VITALS — BP 148/74 | HR 82 | Temp 98.1°F | Resp 18

## 2014-01-23 DIAGNOSIS — I272 Pulmonary hypertension, unspecified: Secondary | ICD-10-CM

## 2014-01-23 DIAGNOSIS — I2789 Other specified pulmonary heart diseases: Secondary | ICD-10-CM

## 2014-01-23 DIAGNOSIS — I5032 Chronic diastolic (congestive) heart failure: Secondary | ICD-10-CM

## 2014-01-23 DIAGNOSIS — R040 Epistaxis: Secondary | ICD-10-CM

## 2014-01-23 NOTE — Progress Notes (Signed)
[facility] [address] [place] [address] [phone]  Date:  01/23/2014   Name:  Monica Blankenship   DOB:  1929/03/03   MRN:  [id number]  PCP:  Georgann Housekeeper, MD    Chief Complaint: Epistaxis   History of Present Illness:  Monica Blankenship is a 80 y.o. very pleasant female patient who presents with the following:  Seen a week ago for left anterior epistaxis and cauterized.  Today she comes with bleeding from the left hesselbach's plexus since 1000. Has brisk bright red bleeding.  No history of injury or overuse.  Is using nasal prong oxygen at home. Not on anticoagulants.  No improvement with over the counter medications or other home remedies. Denies other complaint or health concern today.   Patient Active Problem List   Diagnosis Date Noted  . Nocturnal hypoxemia 01/08/2014  . Dehydration 11/18/2013  . Acute kidney injury 11/17/2013  . Bradycardia 11/17/2013  . Hypotension 11/17/2013  . Acute renal failure 11/17/2013  . CAP (community acquired pneumonia) 11/05/2013  . Obstructive chronic bronchitis without exacerbation 11/05/2013  . Daytime somnolence 11/05/2013  . Pulmonary HTN 10/21/2013  . HTN (hypertension)   . Chronic diastolic CHF (congestive heart failure) 10/01/2013  . Allergy   . Anemia   . Anxiety   . Insomnia   . Osteoarthritis   . Obesity   . Diastolic dysfunction   . CKD (chronic kidney disease)   . Mitral stenosis   . Aortic stenosis   . Aortic regurgitation     Past Medical History  Diagnosis Date  . Allergy   . CHF (congestive heart failure)   . Anxiety   . Insomnia   . Osteoarthritis   . Anemia   . Fatigue   . Obesity   . Diastolic dysfunction   . CKD (chronic kidney disease)   . Mitral stenosis   . Aortic stenosis   . Aortic regurgitation   . HTN (hypertension)     Past Surgical History  Procedure Laterality Date  . Eye surgery    . Abdominal hysterectomy    . Appendectomy    . Cataract extraction,  bilateral      History  Substance Use Topics  . Smoking status: Former Smoker -- 0.25 packs/day for 1 years  . Smokeless tobacco: Never Used     Comment: smoked 1-3 cigs/day in school for about a year some days only  . Alcohol Use: No    Family History  Problem Relation Age of Onset  . Hypertension Mother   . CVA Mother     No Known Allergies  Medication list has been reviewed and updated.  Current Outpatient Prescriptions on File Prior to Visit  Medication Sig Dispense Refill  . ALPRAZolam (XANAX) 0.25 MG tablet Take 0.25 mg by mouth 2 (two) times daily as needed for anxiety.       Marland Kitchen aspirin 81 MG chewable tablet Chew 81 mg by mouth daily.      . calcium gluconate 500 MG tablet Take 500 mg by mouth 2 (two) times daily.      . cholecalciferol (VITAMIN D) 1000 UNITS tablet Take 1,000 Units by mouth daily.      Marland Kitchen doxazosin (CARDURA) 8 MG tablet Take 8 mg by mouth at bedtime.      . ferrous sulfate 325 (65 FE) MG tablet Take 325 mg by mouth daily with breakfast.      . folic acid (FOLVITE) 1 MG tablet  Take 1 mg by mouth daily.      . furosemide (LASIX) 40 MG tablet Take 0.5 tablets (20 mg total) by mouth daily.  30 tablet  0  . loratadine (CLARITIN) 10 MG tablet Take 10 mg by mouth as needed for allergies.       . methotrexate (RHEUMATREX) 2.5 MG tablet Caution:Chemotherapy. Protect from light. Take as directed per Dr. Nickola Major      . metoprolol tartrate (LOPRESSOR) 25 MG tablet Take 1 tablet (25 mg total) by mouth 2 (two) times daily.      . mometasone-formoterol (DULERA) 100-5 MCG/ACT AERO Inhale 2 puffs into the lungs as needed for wheezing or shortness of breath.       Bertram Gala Glycol-Propyl Glycol (SYSTANE) 0.4-0.3 % SOLN Apply 1 drop to eye as needed (for dry eyes.).       Marland Kitchen potassium chloride (K-DUR) 10 MEQ tablet Take 10 mEq by mouth daily.      . predniSONE (DELTASONE) 5 MG tablet Take 1.5 tablets      . traMADol (ULTRAM) 50 MG tablet Take 50 mg by mouth 3 (three) times  daily as needed for moderate pain.       . traZODone (DESYREL) 100 MG tablet Take 0.5 tablets (50 mg total) by mouth at bedtime.  1 tablet  0  . VENTOLIN HFA 108 (90 BASE) MCG/ACT inhaler Inhale 2 puffs into the lungs every 6 (six) hours as needed for wheezing or shortness of breath.       . verapamil (COVERA HS) 240 MG (CO) 24 hr tablet Take 240 mg by mouth at bedtime.       No current facility-administered medications on file prior to visit.    Review of Systems:  As per HPI, otherwise negative.    Physical Examination: Filed Vitals:   01/23/14 1308  BP: 148/74  Pulse: 82  Temp: 98.1 F (36.7 C)  Resp: 18   There were no vitals filed for this visit. There is no weight on file to calculate BMI. Ideal Body Weight:     GEN: WDWN, NAD, Non-toxic, Alert & Oriented x 3 HEENT: Atraumatic, Normocephalic.  Ears and Nose: No external deformity.  Anterior nosebleed on left with prominent vascular formation.   EXTR: No clubbing/cyanosis/edema NEURO: Normal gait.  PSYCH: Normally interactive. Conversant. Not depressed or anxious appearing.  Calm demeanor.    Assessment and Plan: Left anterior epistaxis Silver nitrate cauterization following decongestant and topical anesthesia.   To go to ER for further nosebleed Not to pick, wipe or blow nose for 48 hours.  Signed,  Phillips Odor, MD

## 2014-01-24 ENCOUNTER — Ambulatory Visit (HOSPITAL_BASED_OUTPATIENT_CLINIC_OR_DEPARTMENT_OTHER): Payer: Medicare PPO

## 2014-01-24 ENCOUNTER — Encounter (HOSPITAL_BASED_OUTPATIENT_CLINIC_OR_DEPARTMENT_OTHER): Payer: Medicare PPO

## 2014-01-25 ENCOUNTER — Telehealth: Payer: Self-pay | Admitting: Pulmonary Disease

## 2014-01-25 NOTE — Telephone Encounter (Signed)
Called spoke with daughter. She reports pt was scheduled for last night but did not go. Reason is bc Saturday night pt was in UC d/t severe nose bleed. They cauterized blood vessel in right nostril.  Pt is refusing sleep study. Pt schedule to come in and see BQ in the AM at 9:15. Daughter wants to discuss next step/options. Will forward to BQ as an [place]

## 2014-01-26 ENCOUNTER — Ambulatory Visit: Payer: Medicare PPO | Admitting: Cardiology

## 2014-01-26 ENCOUNTER — Ambulatory Visit: Payer: Medicare PPO | Admitting: Pulmonary Disease

## 2014-01-28 ENCOUNTER — Ambulatory Visit: Payer: Medicare PPO | Admitting: Internal Medicine

## 2014-01-29 ENCOUNTER — Institutional Professional Consult (permissible substitution): Payer: Medicare PPO | Admitting: Pulmonary Disease

## 2014-01-29 ENCOUNTER — Telehealth: Payer: Self-pay | Admitting: Pulmonary Disease

## 2014-01-29 NOTE — Telephone Encounter (Signed)
Called daughter. She needed nothing further as she wants to keep Thursday appt

## 2014-02-02 ENCOUNTER — Telehealth: Payer: Self-pay | Admitting: Cardiology

## 2014-02-02 ENCOUNTER — Institutional Professional Consult (permissible substitution): Payer: Medicare PPO | Admitting: Pulmonary Disease

## 2014-02-02 NOTE — Telephone Encounter (Signed)
New Problem:  Pt has had a 5 lb wt gain over the past week. On 2/25 pt was 181 lbs then pt went up to 187 lbs on 3/1 and as of today, 3/3 , pt is 185 lbs. Demeeka states the pt's sob seems to have improved. States the pt was on oxygen at bed time but has since gone to the urgent care twice for nose bleeds and is no longer on oxygen at bed time.Marland KitchenMarland Kitchen

## 2014-02-02 NOTE — Telephone Encounter (Signed)
To Dr Mayford Knife to advise,

## 2014-02-03 ENCOUNTER — Ambulatory Visit (INDEPENDENT_AMBULATORY_CARE_PROVIDER_SITE_OTHER): Payer: Medicare PPO | Admitting: Physician Assistant

## 2014-02-03 ENCOUNTER — Encounter: Payer: Self-pay | Admitting: Physician Assistant

## 2014-02-03 VITALS — BP 124/69 | HR 69 | Ht 63.0 in | Wt 190.0 lb

## 2014-02-03 DIAGNOSIS — I5032 Chronic diastolic (congestive) heart failure: Secondary | ICD-10-CM

## 2014-02-03 DIAGNOSIS — I10 Essential (primary) hypertension: Secondary | ICD-10-CM

## 2014-02-03 DIAGNOSIS — I509 Heart failure, unspecified: Secondary | ICD-10-CM

## 2014-02-03 NOTE — Telephone Encounter (Signed)
Lawson Fiscal does not work on Thursdays and Lorin Picket is full tomorrow.

## 2014-02-03 NOTE — Assessment & Plan Note (Signed)
Stable

## 2014-02-03 NOTE — Telephone Encounter (Signed)
Please work her in with Tereso Newcomer or Norma Fredrickson tomorrow 3/5

## 2014-02-03 NOTE — Progress Notes (Signed)
HPI:   This is an 80 year old African American female patient Dr. Carolanne Grumbling who has history of diastolic heart failure, hypertension, mild MS, moderate MR, mild AF, and mild to moderate AI and mild pulmonary hypertension. She last saw Dr. Mayford Knife in 10/2013 at which time she was having trouble with acute on chronic diastolic heart failure. Her her Lasix was resumed as well as her beta blocker for her hypertension.  Since then the patient has had a lot of trouble with ongoing shortness of breath, dyspnea on exertion and has had 2 sleep studies that were not fully completed. She was started on oxygen by Dr. Kendrick Fries. She is developed to severe bloody noses requiring visits to the [place] urgent care and cauterization. She said she can't use a nasal cannula because she develops an instant nosebleed. She has a turbo fear of the mask and is not using any oxygen at all. She has an appointment with Dr. Lytle Michaels tomorrow morning. Her edema has improved. Her dyspnea on exertion continues and her daughter says she seems to become disoriented after she ambulates.  No Known Allergies  Current Outpatient Prescriptions on File Prior to Visit: ALPRAZolam (XANAX) 0.25 MG tablet, Take 0.25 mg by mouth 2 (two) times daily as needed for anxiety. , Disp: , Rfl:  aspirin 81 MG chewable tablet, Chew 81 mg by mouth daily. Take 1-2 hrs between takign aspirin and methotrexate, Disp: , Rfl:  calcium gluconate 500 MG tablet, Take 500 mg by mouth 2 (two) times daily., Disp: , Rfl:  cholecalciferol (VITAMIN D) 1000 UNITS tablet, Take 1,000 Units by mouth daily., Disp: , Rfl:  doxazosin (CARDURA) 8 MG tablet, Take 8 mg by mouth at bedtime., Disp: , Rfl:  ferrous sulfate 325 (65 FE) MG tablet, Take 325 mg by mouth daily with breakfast., Disp: , Rfl:  folic acid (FOLVITE) 1 MG tablet, Take 1 mg by mouth daily., Disp: , Rfl:  furosemide (LASIX) 40 MG tablet, Take 0.5 tablets (20 mg total) by mouth daily., Disp: 30 tablet, Rfl:  0 loratadine (CLARITIN) 10 MG tablet, Take 10 mg by mouth as needed for allergies. , Disp: , Rfl:  methotrexate (RHEUMATREX) 2.5 MG tablet, Caution:Chemotherapy. Protect from light.Take as directed per Dr. Dianah Field 1-2 hrs between taking Aspirin and Methotrexate, Disp: , Rfl:  mometasone-formoterol (DULERA) 100-5 MCG/ACT AERO, Inhale 2 puffs into the lungs as needed for wheezing or shortness of breath. , Disp: , Rfl:  Polyethyl Glycol-Propyl Glycol (SYSTANE) 0.4-0.3 % SOLN, Apply 1 drop to eye as needed (for dry eyes.). , Disp: , Rfl:  potassium chloride (K-DUR) 10 MEQ tablet, Take 10 mEq by mouth daily., Disp: , Rfl:  predniSONE (DELTASONE) 5 MG tablet, Take 1.5 tablets, Disp: , Rfl:  traMADol (ULTRAM) 50 MG tablet, Take 50 mg by mouth 3 (three) times daily as needed for moderate pain. , Disp: , Rfl:  traZODone (DESYREL) 100 MG tablet, Take 0.5 tablets (50 mg total) by mouth at bedtime., Disp: 1 tablet, Rfl: 0 VENTOLIN HFA 108 (90 BASE) MCG/ACT inhaler, Inhale 2 puffs into the lungs every 6 (six) hours as needed for wheezing or shortness of breath. , Disp: , Rfl:  verapamil (COVERA HS) 240 MG (CO) 24 hr tablet, Take 240 mg by mouth at bedtime. Take 1-2 hrs between taking Metoprolol and Verapamil., Disp: , Rfl:   No current facility-administered medications on file prior to visit.   Past Medical History:   Allergy  CHF (congestive heart failure)                               Anxiety                                                      Insomnia                                                     Osteoarthritis                                               Anemia                                                       Fatigue                                                      Obesity                                                      Diastolic dysfunction                                        CKD (chronic kidney disease)                                  Mitral stenosis                                              Aortic stenosis                                              Aortic regurgitation                                         HTN (hypertension)  Past Surgical History:   EYE SURGERY                                                   ABDOMINAL HYSTERECTOMY                                        APPENDECTOMY                                                  CATARACT EXTRACTION, BILATERAL                               Review of patient's family history indicates:   Hypertension                   Mother                   CVA                            Mother                   Social History   Marital Status: Married             Spouse Name:                      Years of Education:                 Number of children:             Occupational History   None on file  Social History Main Topics   Smoking Status: Former Smoker                   Packs/Day: 0.25  Years: 1       Smokeless Status: Never Used                       Comment: smoked 1-3 cigs/day in school for about a             year some days only   Alcohol Use: No             Drug Use: No             Sexual Activity: Not on file        Other Topics            Concern   None on file  Social History Narrative   None on file    ROS: See history of present illness otherwise negative   PHYSICAL EXAM: Well-nournished, in no acute distress. Neck: No JVD, HJR, Bruit, or thyroid enlargement  Lungs: Decreased breath sounds but No tachypnea, clear without wheezing, rales, or rhonchi  Cardiovascular: RRR, PMI not displaced, distant heart sounds, positive S4 and 2/6 systolic murmur at the left sternal border, no bruit, thrill, or heave.  Abdomen: BS normal. Soft without organomegaly, masses, lesions or tenderness.  Extremities:  without cyanosis, clubbing or edema. Good distal pulses bilateral  SKin: Warm, no  lesions or rashes   Musculoskeletal: No deformities  Neuro: no focal signs  BP 124/69  Pulse 69  Ht 5\' 3"  (1.6 m)  Wt 190 lb (86.183 kg)  BMI 33.67 kg/m2

## 2014-02-03 NOTE — Assessment & Plan Note (Signed)
Patient's heart failure is compensated. She continues to have dyspnea on exertion which I believe is a combination of her pulmonary hypertension and lung disease. She has to find a way to wear her oxygen. She's had a lot of trouble with a nasal cannula causing severe nosebleeds and she has a fear of the mask. I've asked her to discuss this with Dr. Kendrick Fries in the morning. Another option would be to refer to ENT to  evaluate for nose bleeds.

## 2014-02-03 NOTE — Patient Instructions (Signed)
Your physician recommends that you schedule a follow-up appointment in: WITH DR. Mayford Knife IN 2 MONTHS   Your physician recommends that you continue on your current medications as directed. Please refer to the Current Medication list given to you today.

## 2014-02-04 ENCOUNTER — Ambulatory Visit (INDEPENDENT_AMBULATORY_CARE_PROVIDER_SITE_OTHER): Payer: Medicare PPO | Admitting: Internal Medicine

## 2014-02-04 ENCOUNTER — Encounter: Payer: Self-pay | Admitting: Internal Medicine

## 2014-02-04 VITALS — BP 140/92 | HR 78 | Temp 98.0°F | Ht 62.0 in | Wt 194.0 lb

## 2014-02-04 DIAGNOSIS — I2789 Other specified pulmonary heart diseases: Secondary | ICD-10-CM

## 2014-02-04 DIAGNOSIS — R0902 Hypoxemia: Secondary | ICD-10-CM

## 2014-02-04 DIAGNOSIS — G4734 Idiopathic sleep related nonobstructive alveolar hypoventilation: Secondary | ICD-10-CM

## 2014-02-04 DIAGNOSIS — I272 Pulmonary hypertension, unspecified: Secondary | ICD-10-CM

## 2014-02-04 NOTE — Telephone Encounter (Signed)
Please work in 3/6

## 2014-02-04 NOTE — Telephone Encounter (Signed)
Pt was seen Yesterday with NP in Office. Her daughter asked her to look at her ankles. She stated that the ankles were fine then. But today at her pulmonologist appt they stated that she was retaining fluid and the Dr stated that it was probably due not having enough oxygen. Both notes from NP and pulmonologist are in system for your review.

## 2014-02-04 NOTE — Assessment & Plan Note (Signed)
10/2013 Echo> LVEF 56-60% with grade 2 diastolic dysfunction, Mild AS/AI, mild MS/MR, LA moderately dilated, RA mild dilation, mod-severe TR, PASP , RV normal size and function. 12/2013 Full PFT> ratio normal, FEV1 1.29 (97% pred), TLC 3.22 (67.5 pred), ERV 0.25 (<0% pred?), DLCO 13.99 (64% pred) - rx noct 02 started 01/08/14

## 2014-02-04 NOTE — Progress Notes (Signed)
Subjective:    Patient ID: [name], female    DOB: [birth year], 80 y.o.   MRN: [id number]  HPI 12/20/13 ov / Dr Kendrick Fries  80 year old female with shortness of breath and cough. She had pertussis as a child but was never hospitalized for any respiratory illnesses. She occasionally smoked cigarettes in high school but never smoked regularly as an adult. In the last 2-3 years she's developed increasing shortness of breath and has been diagnosed with congestive heart failure which is being treated by a cardiology (Dr. Mayford Knife). She was referred to me today because a recent echocardiogram showed pulmonary hypertension.  The last several months of been rough. She had an episode of bronchitis which lasted for several weeks and eventually lead to pneumonia apparently. She was treated with multiple rounds of antibiotics for several weeks. She a lot of dyspnea during this time. She was diagnosed with chronic bronchitis during that time and was placed on an inhaler. She received breathing treatments at her doctor's office which would help and she said that the inhaler Elwin Sleight) would also help with her breathing when she would use it at home. She had fever and some sputum production during this time. Also during this time she had some leg swelling in pulmonary vascular congestion noted on chest imaging. Per my review of recent cardiology records it seems that she was treated with increasing doses of diuretics during this time. By the time she saw Dr. Mayford Knife again in November of 2014 her symptoms it started to improve yet she wasn't still quite recovered.  At this point, she says that the cough is resolved. She is no longer using the [facility]. She still feels somewhat weak but she is much better than she was several weeks ago. She is not having leg swelling or chest pain.  Her family describes significant daytime somnolence and heavy snoring at night. She has never had a sleep study.  She has had episodes of  recurrent bronchitis on an annual basis. rec Sleep study >  Pos central apnea but could not tol any mask     01/08/2014 f/u ov/Duriel Deery re:   Prednisone 7.5 daily  Chief Complaint  Patient presents with  . Acute Visit    Pt c/o increased SOB for the past 2 wks. She states gets SOB just walking from room to room at home.    extremely difficult hx , daughter kept correcting the pt  about symptoms and chronology of events  - leg swelling correlates with worse sob but is not supposed to take any extra lasix per daughter because "she gets dehydrated" rec Please see patient coordinator before you leave today  to schedule night time 02 at 2lpm  Start oxygen 2lpm at bedtime for now until you see Dr Shelle Iron   02/04/2014 f/u ov/Rhyanna Sorce re: PAH ? Secondary to OSA/ noct hypoxemia Chief Complaint  Patient presents with  . Follow-up    Reports not using 02 since 01/23/14 due to nose bleeds.  Still having sob with exertion.  Discoloration in both feet and fungus on toe nails     No obvious day to day or daytime variabilty or assoc cough or cp or chest tightness, subjective wheeze overt sinus or hb symptoms. No unusual exp hx or h/o childhood pna/ asthma or knowledge of premature birth.  Sleeping ok without nocturnal  or early am exacerbation  of respiratory  c/o's or need for noct saba. Also denies any obvious fluctuation of symptoms with weather or  environmental changes or other aggravating or alleviating factors except as outlined above   Current Medications, Allergies, Complete Past Medical History, Past Surgical History, Family History, and Social History were reviewed in [employer] record.  ROS  The following are not active complaints unless bolded sore throat, dysphagia, dental problems, itching, sneezing,  nasal congestion or excess/ purulent secretions, ear ache,   fever, chills, sweats, unintended wt loss, pleuritic or exertional cp, hemoptysis,  orthopnea pnd or leg swelling,  presyncope, palpitations, heartburn, abdominal pain, anorexia, nausea, vomiting, diarrhea  or change in bowel or urinary habits, change in stools or urine, dysuria,hematuria,  rash, arthralgias, visual complaints, headache, numbness weakness or ataxia or problems with walking or coordination,  change in mood/affect or ?memory.             Past Medical History  Diagnosis Date  . Allergy   . CHF (congestive heart failure)   . Anxiety   . Insomnia   . Osteoarthritis   . Anemia   . Fatigue   . Obesity   . Diastolic dysfunction   . CKD (chronic kidney disease)   . Mitral stenosis   . Aortic stenosis   . Aortic regurgitation   . HTN (hypertension)           Objective:   Physical Exam  Wt Readings from Last 3 Encounters:  02/04/14 194 lb (87.998 kg)  02/03/14 190 lb (86.183 kg)  01/17/14 181 lb (82.101 kg)       Gen: chronically ill appearing , no acute distress HEENT: NCAT, PERRL, EOMi, OP clear, neck supple without masses PULM: CTA B CV: RRR, prominent S4, no clear murmur, no JVD-  2+ pitting bilateral Lower ext AB: BS+, soft, nontender, no hsm Ext: warm,   no clubbing, no cyanosis Derm: no rash or skin breakdown Neuro: poor short term memory, no motor deficits  Multiple CXR's reviewed from October and November 2014, some pulmonary vascular congestion noted in October; I question a RML infiltrate on the 10/2013 study     Assessment & Plan:

## 2014-02-04 NOTE — Assessment & Plan Note (Signed)
-   see split night study 12/15/13 > not able to tolerate any mask per pt and daughter - Started 02 at 2lpm 01/08/14 per Dr Teddy Spike rec > ? Caused nose bleeding  - 02/04/2014  Change 02 to 1lpm at hs with humidity and increase to 2lpm if tolerates after a week  I had an extended discussion with the patient today lasting 15 to 20 minutes of a 25 minute visit on the following issues:   No way to treat leg swelling effectively until we treat the most likely (but not only ) underlying problem, noct hypoxemia  Will leave on 02 at 2lpm for now and try humidification pending eval by Dr Shelle Iron

## 2014-02-04 NOTE — Patient Instructions (Addendum)
Please see patient coordinator before you leave today  to schedule :  Retry 02 1 lpm with humidity then after a week if tolerated increase to 2lpm   Hold aspirin anytime there is any bleeding from any source   Reschedule  sleep consultation with Dr Shelle Iron

## 2014-02-04 NOTE — Telephone Encounter (Signed)
I would like her to be seen by our office tomorrow to assess her fluid status

## 2014-02-05 ENCOUNTER — Ambulatory Visit: Payer: Medicare PPO | Admitting: Nurse Practitioner

## 2014-02-05 NOTE — Telephone Encounter (Signed)
I have spoken with pt about coming in for appointment to evaluate fluid level. She states she does not have time today due to: 1.) no ride available today & 2.) family coming in from [place] today.  I have called Dr. Mayford Knife about this. She would like pt to be seen on Monday by the DOD or PA. She would also like pt to increase furosemide to 1 tablet daily today, tomorrow, & Sunday.  The patient & daughter are aware of these changes & why it is important for her to come in on Monday. I have also left a message for the patient's [facility] to call back   Mylo Red RN

## 2014-02-05 NOTE — Telephone Encounter (Signed)
To Triage. I was going to call this morning, But I am now in ZONE. Please set pt up to be seen today per Dr Mayford Knife.

## 2014-02-07 ENCOUNTER — Emergency Department (HOSPITAL_COMMUNITY)
Admission: EM | Admit: 2014-02-07 | Discharge: 2014-02-07 | Disposition: A | Discharge status: Home / Self Care | Payer: Medicare PPO | Attending: Emergency Medicine | Admitting: Emergency Medicine

## 2014-02-07 ENCOUNTER — Emergency Department (HOSPITAL_COMMUNITY): Payer: Medicare PPO

## 2014-02-07 DIAGNOSIS — Z87891 Personal history of nicotine dependence: Secondary | ICD-10-CM | POA: Insufficient documentation

## 2014-02-07 DIAGNOSIS — I509 Heart failure, unspecified: Secondary | ICD-10-CM | POA: Insufficient documentation

## 2014-02-07 DIAGNOSIS — E860 Dehydration: Secondary | ICD-10-CM

## 2014-02-07 DIAGNOSIS — I129 Hypertensive chronic kidney disease with stage 1 through stage 4 chronic kidney disease, or unspecified chronic kidney disease: Secondary | ICD-10-CM | POA: Insufficient documentation

## 2014-02-07 DIAGNOSIS — Z79899 Other long term (current) drug therapy: Secondary | ICD-10-CM | POA: Insufficient documentation

## 2014-02-07 DIAGNOSIS — E669 Obesity, unspecified: Secondary | ICD-10-CM | POA: Insufficient documentation

## 2014-02-07 DIAGNOSIS — R5381 Other malaise: Secondary | ICD-10-CM | POA: Insufficient documentation

## 2014-02-07 DIAGNOSIS — IMO0002 Reserved for concepts with insufficient information to code with codable children: Secondary | ICD-10-CM | POA: Insufficient documentation

## 2014-02-07 DIAGNOSIS — G4700 Insomnia, unspecified: Secondary | ICD-10-CM | POA: Insufficient documentation

## 2014-02-07 DIAGNOSIS — R5383 Other fatigue: Secondary | ICD-10-CM

## 2014-02-07 DIAGNOSIS — N189 Chronic kidney disease, unspecified: Secondary | ICD-10-CM | POA: Insufficient documentation

## 2014-02-07 DIAGNOSIS — Z7982 Long term (current) use of aspirin: Secondary | ICD-10-CM | POA: Insufficient documentation

## 2014-02-07 DIAGNOSIS — D649 Anemia, unspecified: Secondary | ICD-10-CM | POA: Insufficient documentation

## 2014-02-07 DIAGNOSIS — M1990 Unspecified osteoarthritis, unspecified site: Secondary | ICD-10-CM | POA: Insufficient documentation

## 2014-02-07 DIAGNOSIS — R42 Dizziness and giddiness: Secondary | ICD-10-CM | POA: Insufficient documentation

## 2014-02-07 DIAGNOSIS — F411 Generalized anxiety disorder: Secondary | ICD-10-CM | POA: Insufficient documentation

## 2014-02-07 LAB — URINALYSIS, ROUTINE W REFLEX MICROSCOPIC
Glucose, UA: NEGATIVE mg/dL
Hgb urine dipstick: NEGATIVE
Ketones, ur: NEGATIVE mg/dL
Nitrite: NEGATIVE
Protein, ur: 30 mg/dL — AB
Specific Gravity, Urine: 1.021 (ref 1.005–1.030)
Urobilinogen, UA: 0.2 mg/dL (ref 0.0–1.0)
pH: 5 (ref 5.0–8.0)

## 2014-02-07 LAB — CBC WITH DIFFERENTIAL/PLATELET
Basophils Absolute: 0 10*3/uL (ref 0.0–0.1)
Basophils Relative: 0 % (ref 0–1)
Eosinophils Absolute: 0 10*3/uL (ref 0.0–0.7)
Eosinophils Relative: 0 % (ref 0–5)
HCT: 30 % — ABNORMAL LOW (ref 36.0–46.0)
Hemoglobin: 9.2 g/dL — ABNORMAL LOW (ref 12.0–15.0)
Lymphocytes Relative: 10 % — ABNORMAL LOW (ref 12–46)
Lymphs Abs: 0.8 10*3/uL (ref 0.7–4.0)
MCH: 31.5 pg (ref 26.0–34.0)
MCHC: 30.7 g/dL (ref 30.0–36.0)
MCV: 102.7 fL — ABNORMAL HIGH (ref 78.0–100.0)
Monocytes Absolute: 0.6 10*3/uL (ref 0.1–1.0)
Monocytes Relative: 8 % (ref 3–12)
Neutro Abs: 6.7 10*3/uL (ref 1.7–7.7)
Neutrophils Relative %: 83 % — ABNORMAL HIGH (ref 43–77)
Platelets: 181 10*3/uL (ref 150–400)
RBC: 2.92 MIL/uL — ABNORMAL LOW (ref 3.87–5.11)
RDW: 18.8 % — ABNORMAL HIGH (ref 11.5–15.5)
WBC: 8.1 10*3/uL (ref 4.0–10.5)

## 2014-02-07 LAB — COMPREHENSIVE METABOLIC PANEL
ALT: 26 U/L (ref 0–35)
AST: 30 U/L (ref 0–37)
Albumin: 3.6 g/dL (ref 3.5–5.2)
Alkaline Phosphatase: 56 U/L (ref 39–117)
BUN: 25 mg/dL — ABNORMAL HIGH (ref 6–23)
CO2: 24 mEq/L (ref 19–32)
Calcium: 9.3 mg/dL (ref 8.4–10.5)
Chloride: 102 mEq/L (ref 96–112)
Creatinine, Ser: 1.62 mg/dL — ABNORMAL HIGH (ref 0.50–1.10)
GFR calc Af Amer: 33 mL/min — ABNORMAL LOW (ref >90)
GFR calc non Af Amer: 28 mL/min — ABNORMAL LOW (ref >90)
Glucose, Bld: 157 mg/dL — ABNORMAL HIGH (ref 70–99)
Potassium: 4.3 mEq/L (ref 3.7–5.3)
Sodium: 142 mEq/L (ref 137–147)
Total Bilirubin: 0.7 mg/dL (ref 0.3–1.2)
Total Protein: 6.7 g/dL (ref 6.0–8.3)

## 2014-02-07 LAB — TROPONIN I: Troponin I: <0.3 ng/mL (ref <0.30)

## 2014-02-07 LAB — URINE MICROSCOPIC-ADD ON

## 2014-02-07 MED ORDER — SODIUM CHLORIDE 0.9 % IV BOLUS (SEPSIS)
500.0000 mL | INTRAVENOUS | Status: AC
  Administered 2014-02-07: 500 mL via INTRAVENOUS

## 2014-02-07 NOTE — ED Provider Notes (Signed)
CSN: [id number]     Arrival date & time 02/07/14  1505 History   First MD Initiated Contact with Patient 02/07/14 1516     Chief Complaint  Patient presents with  . Near Syncope     (Consider location/radiation/quality/duration/timing/severity/associated sxs/prior Treatment) HPI Patient presents to the emergency department with syncope, that occurred earlier today.  The patient, states, that she started feeling dehydrated and weak after cooking breakfast.  Patient, states, that she was walking back to her bedroom, when she became dizzy and passed out.  Patient, states, that her Lasix was recently increased from 20 mg to 40 mg..  Patient, states, that she's not having chest pain, shortness of breath, nausea, vomiting, diarrhea, headache, blurred vision, weakness, numbness, dysuria, fever, or rash.  The patient, states, that she's not been taking much water of the last few days. Past Medical History  Diagnosis Date  . Allergy   . CHF (congestive heart failure)   . Anxiety   . Insomnia   . Osteoarthritis   . Anemia   . Fatigue   . Obesity   . Diastolic dysfunction   . CKD (chronic kidney disease)   . Mitral stenosis   . Aortic stenosis   . Aortic regurgitation   . HTN (hypertension)    Past Surgical History  Procedure Laterality Date  . Eye surgery    . Abdominal hysterectomy    . Appendectomy    . Cataract extraction, bilateral     Family History  Problem Relation Age of Onset  . Hypertension Mother   . CVA Mother    History  Substance Use Topics  . Smoking status: Former Smoker -- 0.25 packs/day for 1 years  . Smokeless tobacco: Never Used     Comment: smoked 1-3 cigs/day in school for about a year some days only  . Alcohol Use: No   OB History   Grav Para Term Preterm Abortions TAB SAB Ect Mult Living                 Review of Systems  All other systems negative except as documented in the HPI. All pertinent positives and negatives as reviewed in the  HPI.  Allergies  Review of patient's allergies indicates no known allergies.  Home Medications   Current Outpatient Rx  Name  Route  Sig  Dispense  Refill  . ALPRAZolam (XANAX) 0.25 MG tablet   Oral   Take 0.25 mg by mouth 2 (two) times daily as needed for anxiety.          Marland Kitchen aspirin 81 MG chewable tablet   Oral   Chew 81 mg by mouth daily. Take 1-2 hrs between takign aspirin and methotrexate         . calcium gluconate 500 MG tablet   Oral   Take 500 mg by mouth 2 (two) times daily.         . cholecalciferol (VITAMIN D) 1000 UNITS tablet   Oral   Take 1,000 Units by mouth daily.         Marland Kitchen doxazosin (CARDURA) 8 MG tablet   Oral   Take 8 mg by mouth at bedtime.         . ferrous sulfate 325 (65 FE) MG tablet   Oral   Take 325 mg by mouth daily with breakfast.         . folic acid (FOLVITE) 1 MG tablet   Oral   Take 1 mg by mouth daily.         Marland Kitchen  furosemide (LASIX) 40 MG tablet   Oral   Take 0.5 tablets (20 mg total) by mouth daily.   30 tablet   0   . loratadine (CLARITIN) 10 MG tablet   Oral   Take 10 mg by mouth as needed for allergies.          . methotrexate (RHEUMATREX) 2.5 MG tablet      Caution:Chemotherapy. Protect from light. Take as directed per Dr. Nickola Major Take 1-2 hrs between taking Aspirin and Methotrexate         . metoprolol tartrate (LOPRESSOR) 25 MG tablet   Oral   Take 25 mg by mouth 2 (two) times daily. Take 1-2 hrs between taking Metoprolol and Verapamil         . mometasone-formoterol (DULERA) 100-5 MCG/ACT AERO   Inhalation   Inhale 2 puffs into the lungs as needed for wheezing or shortness of breath.          Bertram Gala Glycol-Propyl Glycol (SYSTANE) 0.4-0.3 % SOLN   Ophthalmic   Apply 1 drop to eye as needed (for dry eyes.).          Marland Kitchen potassium chloride (K-DUR) 10 MEQ tablet   Oral   Take 10 mEq by mouth daily.         . predniSONE (DELTASONE) 5 MG tablet      Take 1.5 tablets         .  traMADol (ULTRAM) 50 MG tablet   Oral   Take 50 mg by mouth 3 (three) times daily as needed for moderate pain.          . traZODone (DESYREL) 100 MG tablet   Oral   Take 0.5 tablets (50 mg total) by mouth at bedtime.   1 tablet   0   . VENTOLIN HFA 108 (90 BASE) MCG/ACT inhaler   Inhalation   Inhale 2 puffs into the lungs every 6 (six) hours as needed for wheezing or shortness of breath.          . verapamil (COVERA HS) 240 MG (CO) 24 hr tablet   Oral   Take 240 mg by mouth at bedtime. Take 1-2 hrs between taking Metoprolol and Verapamil.          BP 127/57  Pulse 91  Temp(Src) 98.9 F (37.2 C) (Oral)  Resp 17  SpO2 97% Physical Exam  Nursing note and vitals reviewed. Constitutional: She is oriented to person, place, and time. She appears well-developed and well-nourished. No distress.  HENT:  Head: Normocephalic and atraumatic.  Mouth/Throat: Oropharynx is clear and moist.  Eyes: Pupils are equal, round, and reactive to light.  Neck: Normal range of motion. Neck supple.  Cardiovascular: Normal rate, regular rhythm and normal heart sounds.  Exam reveals no gallop and no friction rub.   No murmur heard. Pulmonary/Chest: Effort normal and breath sounds normal. No respiratory distress.  Neurological: She is alert and oriented to person, place, and time. She exhibits normal muscle tone. Coordination normal.  Skin: Skin is warm and dry.    ED Course  Procedures (including critical care time) Labs Review Labs Reviewed  CBC WITH DIFFERENTIAL - Abnormal; Notable for the following:    RBC 2.92 (*)    Hemoglobin 9.2 (*)    HCT 30.0 (*)    MCV 102.7 (*)    RDW 18.8 (*)    Neutrophils Relative % 83 (*)    Lymphocytes Relative 10 (*)    All other components within  normal limits  COMPREHENSIVE METABOLIC PANEL - Abnormal; Notable for the following:    Glucose, Bld 157 (*)    BUN 25 (*)    Creatinine, Ser 1.62 (*)    GFR calc non Af Amer 28 (*)    GFR calc Af Amer 33  (*)    All other components within normal limits  URINALYSIS, ROUTINE W REFLEX MICROSCOPIC - Abnormal; Notable for the following:    Color, Urine AMBER (*)    APPearance CLOUDY (*)    Bilirubin Urine SMALL (*)    Protein, ur 30 (*)    Leukocytes, UA SMALL (*)    All other components within normal limits  URINE MICROSCOPIC-ADD ON - Abnormal; Notable for the following:    Casts HYALINE CASTS (*)    All other components within normal limits  URINE CULTURE  TROPONIN I   Imaging Review Dg Chest 2 View  02/07/2014   CLINICAL DATA:  Short of breath.  EXAM: CHEST  2 VIEW  COMPARISON:  11/17/2013  FINDINGS: Cardiac silhouette is mildly enlarged. Normal mediastinal and hilar contours.  Lungs are clear.  No pleural effusion or pneumothorax.  Bony thorax is demineralized but grossly intact.  IMPRESSION: No acute cardiopulmonary disease.   Electronically Signed   By: Amie Portland M.D.   On: 02/07/2014 16:28     EKG Interpretation   Date/Time:  Sunday February 07 2014 15:31:25 EDT Ventricular Rate:  51 PR Interval:    QRS Duration: 134 QT Interval:  487 QTC Calculation: 448 R Axis:   -45 Text Interpretation:  Atrial fibrillation Left bundle branch block No  significant change since last tracing Confirmed by JACUBOWITZ  MD, SAM  [phone]) on 02/07/2014 3:33:21 PM      Patient, initially had some dizziness, with ambulation and, visual disturbance in the left eye.  She was also initially orthostatic after receiving IV fluids and oral fluids.  She is feeling much better.  She was able to ambulate without difficulty and visual changes, that cleared.  The patient most likely had dehydration, do to Lasix with the increasing doses.  Patient, states she would like to go home and she will follow up with Dr. Mayford Knife tomorrow.  Told to return here as needed.  Family is given a plan and all questions were answered.  Everyone voices an understanding  Carlyle Dolly, [place] 02/07/14 2048

## 2014-02-07 NOTE — ED Notes (Signed)
Pt. Reports that she "got dizzy" after standing up. Pt. States "blacked out" in left eye. Pt reports starting on a new dose of medication today. Pt reports that her left eye has had episodes of "going black" also yr ago and 6 wks ago. Family reports needing to "pick pt up off the floor." Family reports that dizziness started with lower back pain this morning after breakfast. She stood up to walk and then became dizzy. Family reports that she was SOB and used her inhaler. Family reports pt looked "pale."  Pt. Denies any injury with the fall.

## 2014-02-07 NOTE — Discharge Instructions (Signed)
Return here as needed.  Followup with Dr. Mayford Knife tomorrow.  Increase your fluid intake

## 2014-02-07 NOTE — ED Notes (Signed)
Patient's daughter's number: [phone].

## 2014-02-07 NOTE — ED Notes (Signed)
Pt. From home via [facility]. Per EMS pt worsening dizziness starting today. Pt's PCP increased lasix from 20 to 40 started today for "fluid around the heart" according to pt's daughter. Per EMS lung sounds clear. Pt. A&O and in NAD.

## 2014-02-07 NOTE — ED Provider Notes (Signed)
Medical screening examination/treatment/procedure(s) were conducted as a shared visit with non-physician practitioner(s) and myself.  I personally evaluated the patient during the encounter.   EKG Interpretation   Date/Time:  Sunday February 07 2014 15:31:25 EDT Ventricular Rate:  51 PR Interval:    QRS Duration: 134 QT Interval:  487 QTC Calculation: 448 R Axis:   -45 Text Interpretation:  Atrial fibrillation Left bundle branch block No  significant change since last tracing Confirmed by Ethelda Chick  MD, Armstead Heiland  [phone]) on 02/07/2014 3:33:21 PM       Doug Sou, MD 02/07/14 2326

## 2014-02-07 NOTE — ED Provider Notes (Signed)
Patient became lightheaded on standing and suffered near syncopal event this morning. Upon standing her left thigh "goes dark. Her Lasix dose was increased from 20 mg to 40 mg this past week to 2 fluid fluid in her legs. On exam patient feels well at present. She is alert nontoxic appearing HEENT exam no facial asymmetry neck supple trachea midline lungs clear auscultation heart bradycardic irregularly irregular abdomen nondistended nontender extremities with trace pretibial pitting edema bilaterally. She becomes lightheaded on standing and vision in left eye is slightly darker upon standing.  Doug Sou, MD 02/07/14 2326

## 2014-02-07 NOTE — ED Notes (Signed)
Family at bedside. 

## 2014-02-07 NOTE — ED Notes (Signed)
MD at bedside. 

## 2014-02-07 NOTE — ED Notes (Signed)
Pt made aware of need for urine specimen 

## 2014-02-07 NOTE — ED Notes (Signed)
Bed: WA11 Expected date:  Expected time:  Means of arrival:  Comments: EMS 

## 2014-02-08 ENCOUNTER — Encounter: Payer: Self-pay | Admitting: Interventional Cardiology

## 2014-02-08 ENCOUNTER — Telehealth: Payer: Self-pay | Admitting: Pulmonary Disease

## 2014-02-08 ENCOUNTER — Ambulatory Visit (INDEPENDENT_AMBULATORY_CARE_PROVIDER_SITE_OTHER): Payer: Medicare PPO | Admitting: Interventional Cardiology

## 2014-02-08 VITALS — BP 140/64 | HR 88 | Ht 62.0 in | Wt 195.8 lb

## 2014-02-08 DIAGNOSIS — I272 Pulmonary hypertension, unspecified: Secondary | ICD-10-CM

## 2014-02-08 DIAGNOSIS — I509 Heart failure, unspecified: Secondary | ICD-10-CM

## 2014-02-08 DIAGNOSIS — J449 Chronic obstructive pulmonary disease, unspecified: Secondary | ICD-10-CM

## 2014-02-08 DIAGNOSIS — I050 Rheumatic mitral stenosis: Secondary | ICD-10-CM

## 2014-02-08 DIAGNOSIS — I5032 Chronic diastolic (congestive) heart failure: Secondary | ICD-10-CM

## 2014-02-08 DIAGNOSIS — I959 Hypotension, unspecified: Secondary | ICD-10-CM

## 2014-02-08 DIAGNOSIS — J4489 Other specified chronic obstructive pulmonary disease: Secondary | ICD-10-CM

## 2014-02-08 DIAGNOSIS — R55 Syncope and collapse: Secondary | ICD-10-CM

## 2014-02-08 DIAGNOSIS — N189 Chronic kidney disease, unspecified: Secondary | ICD-10-CM

## 2014-02-08 DIAGNOSIS — G4734 Idiopathic sleep related nonobstructive alveolar hypoventilation: Secondary | ICD-10-CM

## 2014-02-08 DIAGNOSIS — R0902 Hypoxemia: Secondary | ICD-10-CM

## 2014-02-08 DIAGNOSIS — I2789 Other specified pulmonary heart diseases: Secondary | ICD-10-CM

## 2014-02-08 NOTE — Progress Notes (Addendum)
Patient ID: Monica Blankenship, female   DOB: May 30, 1929, 80 y.o.   MRN: [id number]    [address], [address] [place], [place]  [address] Phone: [phone] Fax:  [phone]  Date:  02/08/2014   ID:  Monica Blankenship, DOB [birth year], MRN [id number]  PCP:  Georgann Housekeeper, MD   ASSESSMENT:  1. Pulmonary hypertension, severe 2. Nocturnal hypoxemia 3. Diastolic heart failure with recent diuresis 4. Syncope with recent diuresis  PLAN:  1. Resume oxygen 24/7 until until edema resolves. 2. She will use today's weight has a dry weight and use 40 mg of Lasix only on days when that weight has gotten to be 5 pounds greater than her baseline 3. Followup with Dr. Mayford Knife 2-4 weeks   SUBJECTIVE: Monica Blankenship is a 80 y.o. female who has pulmonary hypertension, diastolic heart failure, recurrent episodes of syncope with excessive diuresis, nocturnal hypoxia, and frequent nosebleeds on nonhumidified O2 therapy. Upon Dr. Norris Cross instructions to increase furosemide to 40 mg daily for 3 days, she had syncope yesterday and was seen in the emergency room. She is now back down to 20 mg per day. She is already had today. She has chronic dyspnea. Edema has completely resolved on the furosemide regimen that was prescribed late last week by Dr. Mayford Knife.   Wt Readings from Last 3 Encounters:  02/08/14 195 lb 12.8 oz (88.814 kg)  02/04/14 194 lb (87.998 kg)  02/03/14 190 lb (86.183 kg)     Past Medical History  Diagnosis Date  . Allergy   . CHF (congestive heart failure)   . Anxiety   . Insomnia   . Osteoarthritis   . Anemia   . Fatigue   . Obesity   . Diastolic dysfunction   . CKD (chronic kidney disease)   . Mitral stenosis   . Aortic stenosis   . Aortic regurgitation   . HTN (hypertension)     Current Outpatient Prescriptions  Medication Sig Dispense Refill  . ALPRAZolam (XANAX) 0.25 MG tablet Take 0.25 mg by mouth 2 (two) times daily as needed for anxiety.       Marland Kitchen aspirin 81 MG  chewable tablet Chew 81 mg by mouth daily. Take 1-2 hrs between takign aspirin and methotrexate      . calcium gluconate 500 MG tablet Take 500 mg by mouth 2 (two) times daily.      . cholecalciferol (VITAMIN D) 1000 UNITS tablet Take 1,000 Units by mouth daily.      Marland Kitchen doxazosin (CARDURA) 8 MG tablet Take 8 mg by mouth at bedtime.      . ferrous sulfate 325 (65 FE) MG tablet Take 325 mg by mouth daily with breakfast.      . folic acid (FOLVITE) 1 MG tablet Take 1 mg by mouth daily.      . furosemide (LASIX) 40 MG tablet Take 0.5 tablets (20 mg total) by mouth daily.  30 tablet  0  . loratadine (CLARITIN) 10 MG tablet Take 10 mg by mouth as needed for allergies.       . methotrexate (RHEUMATREX) 2.5 MG tablet Caution:Chemotherapy. Protect from light. Take as directed per Dr. Nickola Major Take 1-2 hrs between taking Aspirin and Methotrexate      . metoprolol tartrate (LOPRESSOR) 25 MG tablet Take 25 mg by mouth 2 (two) times daily. Take 1-2 hrs between taking Metoprolol and Verapamil      . mometasone-formoterol (DULERA) 100-5 MCG/ACT AERO Inhale 2 puffs into the lungs as  needed for wheezing or shortness of breath.       Bertram Gala Glycol-Propyl Glycol (SYSTANE) 0.4-0.3 % SOLN Apply 1 drop to eye as needed (for dry eyes.).       Marland Kitchen potassium chloride (K-DUR) 10 MEQ tablet Take 10 mEq by mouth daily.      . predniSONE (DELTASONE) 5 MG tablet Take 1.5 tablets      . traMADol (ULTRAM) 50 MG tablet Take 50 mg by mouth 3 (three) times daily as needed for moderate pain.       . VENTOLIN HFA 108 (90 BASE) MCG/ACT inhaler Inhale 2 puffs into the lungs every 6 (six) hours as needed for wheezing or shortness of breath.       . verapamil (COVERA HS) 240 MG (CO) 24 hr tablet Take 240 mg by mouth at bedtime. Take 1-2 hrs between taking Metoprolol and Verapamil.       No current facility-administered medications for this visit.    Allergies:   No Known Allergies  Social History:  The patient  reports that she has  quit smoking. She has never used smokeless tobacco. She reports that she does not drink alcohol or use illicit drugs.   ROS:  Please see the history of present illness.   Stable appetite. Paranoid about using oxygen because of nosebleeds.   All other systems reviewed and negative.   OBJECTIVE: VS:  BP 140/64  Pulse 88  Ht 5\' 2"  (1.575 m)  Wt 195 lb 12.8 oz (88.814 kg)  BMI 35.80 kg/m2  SpO2 94% Well nourished, well developed, in no acute distress, elderly and dyspneic HEENT: normal Neck: JVD flat. Carotid bruit absent  Cardiac:  normal S1, S2; RRR; no murmur Lungs:  No wheezing, rhonchi or rales. Decreased breath sounds at the bases with faint rales possibly consistent with pulmonary fibrosis her Abd: soft, nontender, no hepatomegaly Ext: Edema absent. Pulses 1+ bilateral Skin: warm and dry Neuro:  CNs 2-12 intact, no focal abnormalities noted  EKG:  Not repeated       Signed, III, MD 02/08/2014 9:37 AM

## 2014-02-08 NOTE — Patient Instructions (Signed)
Your physician recommends that you continue on your current medications as directed. Please refer to the Current Medication list given to you today.  Oxygen should be administered 24 hours until Edema resolves  Use today's weight as your baseline weight. If weight is up 5lbs use 40mg  of Lasix for 1 dose.  Your physician recommends that you schedule a follow-up appointment in: 2-3 weeks with Dr.Turner

## 2014-02-08 NOTE — Telephone Encounter (Signed)
Spoke with pt's daughter - She states that pt was seen in ED this weekend and by Dr Mayford Knife this am and was told that pt has pulmonary edema.  They are wanting someone to come out ASAP to look at oxygen .  Order was placed for oxygen re instruct on 02/04/14 with [employer].  Spoke with Drenda Freeze at [facility] and she is going to contact pt and her daughter to discuss when they will coming out to assess oxygen needs.

## 2014-02-09 ENCOUNTER — Telehealth: Payer: Self-pay | Admitting: Pulmonary Disease

## 2014-02-09 ENCOUNTER — Telehealth: Payer: Self-pay | Admitting: Interventional Cardiology

## 2014-02-09 DIAGNOSIS — I272 Pulmonary hypertension, unspecified: Secondary | ICD-10-CM

## 2014-02-09 LAB — URINE CULTURE
Colony Count: NO GROWTH
Culture: NO GROWTH

## 2014-02-09 NOTE — Telephone Encounter (Signed)
Called and spoke with Monica Blankenship. She reports the heart doctor Dr. Katrinka Blazing advised pt she needs to be on O2 all the time. She wants [place] to send an order for portable 02. I see where [facility] documents pt is to use O2 at bedtime but nothing during the day. Please advise Dr. Sherene Sires thanks

## 2014-02-09 NOTE — Telephone Encounter (Signed)
Fine with me to approve 2lpm 24/7 due to Pulmonary hypertension

## 2014-02-09 NOTE — Telephone Encounter (Signed)
New Message  Pt daughter called states that the pt woke up this morning and could not see out of her right eye. Daughter claims that the pt having problems standing. Daughter is concerned because Sunday (02/07/2014) Pt tried to stand and fainted.  Daughter says that within the last OV the pt was diagnosed with pulinary edema.  Daughter is concerned that she may have to take the pt to the ER. They have contact Genevieve Norlander, for a home nurse as a plan B. Please call back to discuss how to move forward.

## 2014-02-09 NOTE — Telephone Encounter (Signed)
Monica Blankenship is aware that we will send an order for portable oxygen. Nothing further is needed.

## 2014-02-09 NOTE — Telephone Encounter (Addendum)
Called patient's daughter Carney Bern) and she advised me that her weight is 190lbs today and taking lasix every day. Feels fine now without any presyncope. Has some swelling in lower extremities but not bad. Advised her to stay on the same dose of Lasix and keep daily weights and to call with any changes. She feels that she needs portable oxygen because Dr.Smith advised her yesterday to use O2 more frequently. [employer] is the company that provides her O2 and they need an order for this. Carney Bern will call her pulmonary doctor for this. Vicente Serene and the patient that I am concerned about her left eye because she lost vision on Sunday and today for a few minutes. She will call Dr.Heckler and ask for an eye exam. Will let Dr.Turner know that she called.

## 2014-02-09 NOTE — Telephone Encounter (Signed)
Routed to triage 

## 2014-02-12 ENCOUNTER — Ambulatory Visit (INDEPENDENT_AMBULATORY_CARE_PROVIDER_SITE_OTHER): Payer: Medicare PPO

## 2014-02-12 ENCOUNTER — Telehealth: Payer: Self-pay | Admitting: Pulmonary Disease

## 2014-02-12 DIAGNOSIS — J449 Chronic obstructive pulmonary disease, unspecified: Secondary | ICD-10-CM

## 2014-02-12 DIAGNOSIS — I270 Primary pulmonary hypertension: Secondary | ICD-10-CM

## 2014-02-12 NOTE — Telephone Encounter (Signed)
I called spoke with Crystal. Advised her only thing we did was walk pt. Nothing further needed

## 2014-02-12 NOTE — Telephone Encounter (Signed)
Spoke with Rosey Bath at [facility]. She states the current order for portable oxygen is pending. [employer] needs qualifying sats, we do not have these documented. Advised pt's daughter that she will need to come and have these done. They will come by today to this.

## 2014-02-16 ENCOUNTER — Telehealth: Payer: Self-pay | Admitting: Pulmonary Disease

## 2014-02-16 NOTE — Telephone Encounter (Signed)
Spoke w/ daughter. She reports someone from [facility] [facility] came out to pt home and walked her around today. Reports was sent from [place]. Pt did not desaturate while walking around. Pt had came in our office on Friday and did not desaturate. Pt is going to do ONO tonight. They just want to make sure they are on track. Will sign off message

## 2014-03-01 ENCOUNTER — Encounter: Payer: Self-pay | Admitting: Pulmonary Disease

## 2014-03-01 ENCOUNTER — Ambulatory Visit (INDEPENDENT_AMBULATORY_CARE_PROVIDER_SITE_OTHER): Payer: Medicare PPO | Admitting: Pulmonary Disease

## 2014-03-01 VITALS — BP 118/78 | HR 65 | Temp 98.2°F | Ht 62.0 in | Wt 186.0 lb

## 2014-03-01 DIAGNOSIS — G4731 Primary central sleep apnea: Secondary | ICD-10-CM

## 2014-03-01 DIAGNOSIS — G4730 Sleep apnea, unspecified: Secondary | ICD-10-CM

## 2014-03-01 NOTE — Progress Notes (Signed)
Subjective:    Patient ID: Monica Blankenship, female    DOB: [birth year], 80 y.o.   MRN: [id number]  HPI The patient is an 80 year old female who I've been asked to see for complex sleep apnea. She underwent a split night study in January of this year, and was found to have an AHI of 58 events per hour. She was started on CPAP, and then changed to bilevel because of pressure induced central apneas. Despite this, she continued to have issues, and optimal pressures were never reached. The patient has been noted to have loud snoring, as well as an abnormal breathing pattern during sleep. She denies awakenings during the night, and initially feels rested upon arising. However, she notes significant sleepiness during the day, and will fall asleep anytime she sits down and gets quiet. This also occurs in the evenings while trying to watch television. The patient states that her weight is up 35 pounds over the last 2 years, and her Epworth score today is 6.   Sleep Questionnaire What time do you typically go to bed?( Between what hours) 11p 11p at 1016 on 03/01/14 by Maisie Fus, CMA How long does it take you to fall asleep? within minutes within minutes at 1016 on 03/01/14 by Maisie Fus, CMA How many times during the night do you wake up? 0 0 at 1016 on 03/01/14 by Maisie Fus, CMA What time do you get out of bed to start your day? 0900 0900 at 1016 on 03/01/14 by Maisie Fus, CMA Do you drive or operate heavy machinery in your occupation? No No at 1016 on 03/01/14 by Maisie Fus, CMA How much has your weight changed (up or down) over the past two years? (In pounds) 10 lb (4.536 kg) 10 lb (4.536 kg) at 1016 on 03/01/14 by Maisie Fus, CMA Have you ever had a sleep study before? Yes Yes at 1016 on 03/01/14 by Maisie Fus, CMA If yes, location of study? [facility] at 1016 on 03/01/14 by Maisie Fus, CMA If yes, date of study? 2015 2015 at 1016 on 03/01/14 by Maisie Fus, CMA Do you currently use CPAP? No No at 1016 on 03/01/14 by Maisie Fus, CMA Do you wear oxygen at any time? Yes Yes at 1016 on 03/01/14 by Maisie Fus, CMA O2 Flow Rate (L/min) 2 L/min2 L/min O2 qhs at 1016 on 03/01/14 by Maisie Fus, CMA   Review of Systems  Constitutional: Negative for fever and unexpected weight change.  HENT: Positive for congestion. Negative for dental problem, ear pain, nosebleeds, postnasal drip, rhinorrhea, sinus pressure, sneezing, sore throat and trouble swallowing.   Eyes: Negative for redness and itching.  Respiratory: Positive for cough, shortness of breath and wheezing. Negative for chest tightness.   Cardiovascular: Negative for palpitations and leg swelling.  Gastrointestinal: Negative for nausea and vomiting.  Genitourinary: Negative for dysuria.  Musculoskeletal: Negative for joint swelling.  Skin: Negative for rash.  Neurological: Negative for headaches.  Hematological: Does not bruise/bleed easily.  Psychiatric/Behavioral: Positive for dysphoric mood. The patient is nervous/anxious.        Objective:   Physical Exam Constitutional:  Overweight female, no acute distress  HENT:  Nares patent without discharge, mild septal deviation to the left with narrowing.   Oropharynx without exudate, palate and uvula are moderately elongated.   Eyes:  Perrla, eomi, no scleral icterus  Neck:  No JVD, no TMG  Cardiovascular:  Normal rate, regular rhythm, no rubs or gallops.  No murmurs        Intact distal pulses but decreased.   Pulmonary :  Normal breath sounds, no stridor or respiratory distress   No rhonchi, or wheezing, +crackles in both bases.   Abdominal:  Soft, nondistended, bowel sounds present.  No tenderness noted.   Musculoskeletal:  1+ lower extremity edema noted.  Lymph Nodes:  No cervical lymphadenopathy noted  Skin:  No cyanosis noted  Neurologic:  Alert, appropriate, moves all 4 extremities without obvious  deficit.         Assessment & Plan:

## 2014-03-01 NOTE — Assessment & Plan Note (Signed)
The patient has been diagnosed with complex sleep apnea, and failed both CPAP and bilevel because of severe pressure induced central apnea. At this point, I think that she would be best served by an ASV device. I have had a long discussion with the patient and her daughter about obstructive and central sleep apnea, including its impact to her cardiovascular health. She will need to return to the sleep Center for a formal titration in order to start this device, and she is agreeable. I stressed to her the importance of aggressive weight loss.

## 2014-03-01 NOTE — Patient Instructions (Signed)
Will schedule for return to the sleep center to be started on an ASV device for your obstructive and central sleep apnea. Work on weight loss.  This is the key to getting rid of the machine. Will arrange followup with me once the study is done.

## 2014-03-02 ENCOUNTER — Telehealth: Payer: Self-pay | Admitting: Pulmonary Disease

## 2014-03-02 NOTE — Telephone Encounter (Signed)
I called and spoke with daughter. She wanted to know if pt still needs to use her O2 at night. I advised her until she is told otherwise then yes. Nothing further needed

## 2014-03-03 ENCOUNTER — Encounter: Payer: Self-pay | Admitting: Cardiology

## 2014-03-06 ENCOUNTER — Encounter: Payer: Self-pay | Admitting: Pulmonary Disease

## 2014-03-11 ENCOUNTER — Other Ambulatory Visit: Payer: Self-pay | Admitting: General Surgery

## 2014-03-11 ENCOUNTER — Encounter: Payer: Self-pay | Admitting: Cardiology

## 2014-03-11 ENCOUNTER — Ambulatory Visit (INDEPENDENT_AMBULATORY_CARE_PROVIDER_SITE_OTHER): Payer: Medicare PPO | Admitting: Cardiology

## 2014-03-11 VITALS — BP 170/72 | HR 70 | Ht 62.0 in | Wt 186.0 lb

## 2014-03-11 DIAGNOSIS — I350 Nonrheumatic aortic (valve) stenosis: Secondary | ICD-10-CM

## 2014-03-11 DIAGNOSIS — I050 Rheumatic mitral stenosis: Secondary | ICD-10-CM

## 2014-03-11 DIAGNOSIS — Z79899 Other long term (current) drug therapy: Secondary | ICD-10-CM

## 2014-03-11 DIAGNOSIS — I509 Heart failure, unspecified: Secondary | ICD-10-CM

## 2014-03-11 DIAGNOSIS — I359 Nonrheumatic aortic valve disorder, unspecified: Secondary | ICD-10-CM

## 2014-03-11 DIAGNOSIS — I5032 Chronic diastolic (congestive) heart failure: Secondary | ICD-10-CM

## 2014-03-11 DIAGNOSIS — I10 Essential (primary) hypertension: Secondary | ICD-10-CM

## 2014-03-11 LAB — BASIC METABOLIC PANEL
BUN: 13 mg/dL (ref 6–23)
CALCIUM: 9.4 mg/dL (ref 8.4–10.5)
CO2: 28 mEq/L (ref 19–32)
Chloride: 105 mEq/L (ref 96–112)
Creatinine, Ser: 1 mg/dL (ref 0.4–1.2)
GFR: 70.64 mL/min (ref >60.00)
Glucose, Bld: 117 mg/dL — ABNORMAL HIGH (ref 70–99)
Potassium: 3.2 mEq/L — ABNORMAL LOW (ref 3.5–5.1)
Sodium: 141 mEq/L (ref 135–145)

## 2014-03-11 MED ORDER — FUROSEMIDE 40 MG PO TABS: 20.0000 mg | ORAL_TABLET | Freq: Every day | ORAL | Status: DC

## 2014-03-11 MED ORDER — POTASSIUM CHLORIDE ER 20 MEQ PO TBCR: 40.0000 meq | EXTENDED_RELEASE_TABLET | Freq: Every day | ORAL | Status: DC

## 2014-03-11 NOTE — Telephone Encounter (Signed)
Pt is aware of med change

## 2014-03-11 NOTE — Patient Instructions (Signed)
Your physician has recommended you make the following change in your medication: Increase Lasix to 20 Mg daily except 40 MG on Tuesdays and Fridays  Your physician recommends that you go to the lab today for a BMET  Your physician has requested that you regularly monitor and record your blood pressure readings at home Twice a day. Please use the same machine at the same time of day to check your readings and record them for one week and call us with the results  Your physician recommends that you schedule a follow-up appointment in: 1 Month with Dr Mayford Knife

## 2014-03-11 NOTE — Progress Notes (Signed)
[address] [place], [place]  [address] Phone: [phone] Fax:  [phone]  Date:  03/11/2014   ID:  Monica Blankenship, DOB [birth year], MRN [id number]  PCP:  Georgann Housekeeper, MD  Cardiologist:  Armanda Magic, MD     History of Present Illness: Monica Blankenship is a 80 y.o. female with a history of HTN, chronic diastolic CHF, , mild AS, mild to moderate AR and mild MS with mild to moderate MR who presents today for followup.  She is doing well.  She denies any chest pain, dizziness,palpitations or syncope.  She has chronic SOB which occurs only occasionally and usually occurs once monthly.  Her BP runs high in the am and then drops after taking her meds.  She was seen in the ER in March after a near syncopal episode felt secondary to dehydration after her Lasix was increased from 20 to 40mg  daily.  Her Lasix was decreased back to 20mg  daily.  She has been having some problems with weight gain and LE edema intermittently and she has been instructed to take an extra lasix when that occurs.     Wt Readings from Last 3 Encounters:  03/11/14 186 lb (84.369 kg)  03/01/14 186 lb (84.369 kg)  02/08/14 195 lb 12.8 oz (88.814 kg)     Past Medical History  Diagnosis Date  . Allergy   . Anxiety   . Insomnia   . Osteoarthritis   . Anemia   . Fatigue   . Obesity   . Diastolic dysfunction   . CKD (chronic kidney disease)   . Mitral stenosis     with moderate MR on echo 4/14  . Aortic stenosis     mild by echo 4/14  . Aortic regurgitation     mild to moderate by echo 4/14  . HTN (hypertension)   . Chronic diastolic CHF (congestive heart failure)     Current Outpatient Prescriptions  Medication Sig Dispense Refill  . benzonatate (TESSALON) 100 MG capsule Take 100 mg by mouth 3 (three) times daily as needed for cough.      . ALPRAZolam (XANAX) 0.25 MG tablet Take 0.25 mg by mouth 2 (two) times daily as needed for anxiety.       5/14 aspirin 81 MG chewable tablet Chew 81 mg by  mouth daily. Take 1-2 hrs between takign aspirin and methotrexate      . calcium gluconate 500 MG tablet Take 500 mg by mouth 2 (two) times daily.      . cholecalciferol (VITAMIN D) 1000 UNITS tablet Take 1,000 Units by mouth daily.      5/14 doxazosin (CARDURA) 8 MG tablet Take 8 mg by mouth at bedtime.      . ferrous sulfate 325 (65 FE) MG tablet Take 325 mg by mouth daily with breakfast.      . folic acid (FOLVITE) 1 MG tablet Take 1 mg by mouth daily.      . furosemide (LASIX) 40 MG tablet Take 0.5 tablets (20 mg total) by mouth daily.  30 tablet  0  . loratadine (CLARITIN) 10 MG tablet Take 10 mg by mouth as needed for allergies.       . methotrexate (RHEUMATREX) 2.5 MG tablet Caution:Chemotherapy. Protect from light. Take as directed per Dr. Marland Kitchen Take 1-2 hrs between taking Aspirin and Methotrexate      . metoprolol tartrate (LOPRESSOR) 25 MG tablet Take 25 mg by mouth 2 (two) times daily. Take 1-2 hrs  between taking Metoprolol and Verapamil      . mometasone-formoterol (DULERA) 100-5 MCG/ACT AERO Inhale 2 puffs into the lungs as needed for wheezing or shortness of breath.       Bertram Gala Glycol-Propyl Glycol (SYSTANE) 0.4-0.3 % SOLN Apply 1 drop to eye as needed (for dry eyes.).       Marland Kitchen potassium chloride (K-DUR) 10 MEQ tablet Take 10 mEq by mouth daily.      . predniSONE (DELTASONE) 5 MG tablet Take 1.5 tablets      . traMADol (ULTRAM) 50 MG tablet Take 50 mg by mouth 3 (three) times daily as needed for moderate pain.       . VENTOLIN HFA 108 (90 BASE) MCG/ACT inhaler Inhale 2 puffs into the lungs every 6 (six) hours as needed for wheezing or shortness of breath.       . verapamil (COVERA HS) 240 MG (CO) 24 hr tablet Take 240 mg by mouth at bedtime. Take 1-2 hrs between taking Metoprolol and Verapamil.       No current facility-administered medications for this visit.    Allergies:   No Known Allergies  Social History:  The patient  reports that she has quit smoking. She has never  used smokeless tobacco. She reports that she does not drink alcohol or use illicit drugs.   Family History:  The patient's family history includes CVA in her mother; Hypertension in her mother.   ROS:  Please see the history of present illness.      All other systems reviewed and negative.   PHYSICAL EXAM: VS:  BP 170/72  Pulse 70  Ht 5\' 2"  (1.575 m)  Wt 186 lb (84.369 kg)  BMI 34.01 kg/m2 Well nourished, well developed, in no acute distress HEENT: normal Neck: no JVD Cardiac:  normal S1, S2; RRR; no murmur Lungs:  clear to auscultation bilaterally, no wheezing, rhonchi or rales Abd: soft, nontender, no hepatomegaly Ext: trace pedal edema Skin: warm and dry Neuro:  CNs 2-12 intact, no focal abnormalities noted       ASSESSMENT AND PLAN:  1. Mild AS and mild to moderate AR 2. Mild MS and moderate MR - recheck 2D echo 3. Chronic diastolic CHF - appears compensated - continue Lasix/beta blocker/Verapamil - check BMET - I have instructed her to take Lasix 20mg  daily except 40 mg on Friday and Tuesdays 4. HTN - elevated on exam today - continue Verapamil/beta blocker/doxazosin - I have asked her to check her BP twice daily for a week and call with the results  Followup with me in 1 month  Signed, Friday, MD 03/11/2014 7:54 AM

## 2014-03-15 ENCOUNTER — Other Ambulatory Visit (INDEPENDENT_AMBULATORY_CARE_PROVIDER_SITE_OTHER): Payer: Medicare PPO

## 2014-03-15 DIAGNOSIS — Z79899 Other long term (current) drug therapy: Secondary | ICD-10-CM

## 2014-03-15 LAB — BASIC METABOLIC PANEL
BUN: 14 mg/dL (ref 6–23)
CO2: 27 mEq/L (ref 19–32)
CREATININE: 0.9 mg/dL (ref 0.4–1.2)
Calcium: 9.4 mg/dL (ref 8.4–10.5)
Chloride: 104 mEq/L (ref 96–112)
GFR: 79.04 mL/min (ref >60.00)
Glucose, Bld: 86 mg/dL (ref 70–99)
POTASSIUM: 3.4 meq/L — AB (ref 3.5–5.1)
Sodium: 140 mEq/L (ref 135–145)

## 2014-03-16 ENCOUNTER — Other Ambulatory Visit: Payer: Self-pay | Admitting: *Deleted

## 2014-03-16 DIAGNOSIS — I272 Pulmonary hypertension, unspecified: Secondary | ICD-10-CM

## 2014-03-16 MED ORDER — POTASSIUM CHLORIDE ER 20 MEQ PO TBCR: EXTENDED_RELEASE_TABLET | ORAL | Status: DC

## 2014-03-17 ENCOUNTER — Encounter: Payer: Self-pay | Admitting: Cardiology

## 2014-03-19 ENCOUNTER — Other Ambulatory Visit (INDEPENDENT_AMBULATORY_CARE_PROVIDER_SITE_OTHER): Payer: Medicare PPO

## 2014-03-19 ENCOUNTER — Encounter: Payer: Self-pay | Admitting: Cardiology

## 2014-03-19 DIAGNOSIS — D649 Anemia, unspecified: Secondary | ICD-10-CM

## 2014-03-19 DIAGNOSIS — I2789 Other specified pulmonary heart diseases: Secondary | ICD-10-CM

## 2014-03-19 DIAGNOSIS — I509 Heart failure, unspecified: Secondary | ICD-10-CM

## 2014-03-19 DIAGNOSIS — I272 Pulmonary hypertension, unspecified: Secondary | ICD-10-CM

## 2014-03-19 LAB — BASIC METABOLIC PANEL
BUN: 16 mg/dL (ref 6–23)
CO2: 28 meq/L (ref 19–32)
Calcium: 9.6 mg/dL (ref 8.4–10.5)
Chloride: 104 mEq/L (ref 96–112)
Creatinine, Ser: 1 mg/dL (ref 0.4–1.2)
GFR: 69.8 mL/min (ref >60.00)
GLUCOSE: 120 mg/dL — AB (ref 70–99)
POTASSIUM: 4.3 meq/L (ref 3.5–5.1)
SODIUM: 140 meq/L (ref 135–145)

## 2014-03-23 ENCOUNTER — Telehealth: Payer: Self-pay | Admitting: Cardiology

## 2014-03-23 NOTE — Telephone Encounter (Signed)
Pt's daughter called regarding pt's potasium medications how many mg she is to take. Daughter is aware that pt is to continue taking 2/ 20 mEq (40 mEq total)  once a day. Daughter verbalized understanding.

## 2014-03-23 NOTE — Telephone Encounter (Signed)
New problem   Pt's daughter stated Duwayne Heck gave pt some instruction concerning her potasium and pt is very confused. Please call daughter to give her he instructions.

## 2014-03-24 ENCOUNTER — Telehealth: Payer: Self-pay

## 2014-03-24 ENCOUNTER — Telehealth: Payer: Self-pay | Admitting: Pulmonary Disease

## 2014-03-24 NOTE — Telephone Encounter (Signed)
Message copied by Velvet Bathe on Wed Mar 24, 2014  2:15 PM ------      Message from: Lupita Leash      Created: Sat Mar 06, 2014 11:00 AM       Morrie Sheldon,            Her overnight oximetry test was reported to me. I believe that Dr. Shelle Iron as are the adjusted her oxygen. Continue check with her to make sure that that has been done already?            Thanks,      Kipp Brood ------

## 2014-03-24 NOTE — Telephone Encounter (Signed)
This is being monitored by [facility].  See phone notes for 03/2014.  Nothing further needed.

## 2014-03-24 NOTE — Telephone Encounter (Signed)
Called and spoke with pts daughter and she wanted to ask [facility] about the sleep test that the pt is scheduled for on the 5th.    She stated that the pt uses the oxygen with the nasal cannula at night on 2 liters.  The daughter wanted to know if the pt can continue to use the oxygen and not have to use the CPAP.  She is trying to figure out the difference between the two.  KC please advise.  Daughter is ok with a call back tomorrow.  thanks

## 2014-03-25 ENCOUNTER — Other Ambulatory Visit: Payer: Self-pay | Admitting: General Surgery

## 2014-03-25 DIAGNOSIS — I359 Nonrheumatic aortic valve disorder, unspecified: Secondary | ICD-10-CM

## 2014-03-26 ENCOUNTER — Ambulatory Visit (HOSPITAL_COMMUNITY): Payer: Medicare PPO | Attending: Cardiology | Admitting: Cardiology

## 2014-03-26 ENCOUNTER — Encounter: Payer: Self-pay | Admitting: Cardiology

## 2014-03-26 ENCOUNTER — Other Ambulatory Visit (HOSPITAL_COMMUNITY): Payer: Medicare PPO

## 2014-03-26 DIAGNOSIS — I080 Rheumatic disorders of both mitral and aortic valves: Secondary | ICD-10-CM | POA: Insufficient documentation

## 2014-03-26 DIAGNOSIS — I359 Nonrheumatic aortic valve disorder, unspecified: Secondary | ICD-10-CM

## 2014-03-26 DIAGNOSIS — I059 Rheumatic mitral valve disease, unspecified: Secondary | ICD-10-CM

## 2014-03-26 DIAGNOSIS — I050 Rheumatic mitral stenosis: Secondary | ICD-10-CM

## 2014-03-26 DIAGNOSIS — I5032 Chronic diastolic (congestive) heart failure: Secondary | ICD-10-CM | POA: Insufficient documentation

## 2014-03-26 NOTE — Telephone Encounter (Signed)
I called spoke with [name]. Made her aware of KC recs. She voiced her understanding and nothing further needed

## 2014-03-26 NOTE — Progress Notes (Signed)
Echo completed

## 2014-03-26 NOTE — Telephone Encounter (Signed)
Patient's daughter calling asking to speak to Morrie Sheldon about the below.  [phone]

## 2014-03-26 NOTE — Telephone Encounter (Signed)
Oxygen will keep her oxygen levels up, but will not do anything for her apnea (where she collapses her airway or her brain forgets to tell her to breath).  Oxygen will not help her sleep or make her more alert during the day.  If her apneas are not treated, she is at increased risk for heart failure, strokes, htn, and can make DM more difficult to control

## 2014-03-30 ENCOUNTER — Other Ambulatory Visit: Payer: Self-pay | Admitting: General Surgery

## 2014-03-30 ENCOUNTER — Telehealth: Payer: Self-pay | Admitting: Cardiology

## 2014-03-30 ENCOUNTER — Encounter: Payer: Self-pay | Admitting: Pulmonary Disease

## 2014-03-30 DIAGNOSIS — I050 Rheumatic mitral stenosis: Secondary | ICD-10-CM

## 2014-03-30 NOTE — Telephone Encounter (Signed)
New message     Returning a nurses call with test results

## 2014-03-30 NOTE — Telephone Encounter (Signed)
Spoke with pt

## 2014-04-01 ENCOUNTER — Encounter: Payer: Self-pay | Admitting: Cardiology

## 2014-04-05 ENCOUNTER — Telehealth: Payer: Self-pay | Admitting: Pulmonary Disease

## 2014-04-05 DIAGNOSIS — G4734 Idiopathic sleep related nonobstructive alveolar hypoventilation: Secondary | ICD-10-CM

## 2014-04-05 NOTE — Telephone Encounter (Signed)
I can see them again Please rx 2LPM O2 qHS and order ONO

## 2014-04-05 NOTE — Telephone Encounter (Signed)
Called spoke with pt daughter [name]. Aware of recs. She voiced her understanding. She also wanted [place] to let Dr. Kendrick Fries know this as well. Will send this as FYI to him.

## 2014-04-05 NOTE — Telephone Encounter (Signed)
Spoke with [name] and notified of recs per Dr Kearney Hard  Pt already has the o2 at home  Order sent to [facility] for ONO 2lpm- [facility]  OV with BQ 04/14/14

## 2014-04-05 NOTE — Telephone Encounter (Signed)
Called and spoke with pt and she stated that she has a couple of questions for BQ:  1.  Can BQ continue to see the pt and what can be done for the pt.  2.  Do they need to refer back to PCP for the concentrator that they have at home for the pt?   pts daughter called in earlier today and stated that the pt will not do the sleep study and did not want to see KC anymore.  BQ please advise. Thanks  No Known Allergies   Current Outpatient Prescriptions on File Prior to Visit  Medication Sig Dispense Refill  . ALPRAZolam (XANAX) 0.25 MG tablet Take 0.25 mg by mouth 2 (two) times daily as needed for anxiety.       Marland Kitchen aspirin 81 MG chewable tablet Chew 81 mg by mouth daily. Take 1-2 hrs between takign aspirin and methotrexate      . benzonatate (TESSALON) 100 MG capsule Take 100 mg by mouth 3 (three) times daily as needed for cough.      . calcium gluconate 500 MG tablet Take 500 mg by mouth 2 (two) times daily.      . cholecalciferol (VITAMIN D) 1000 UNITS tablet Take 1,000 Units by mouth daily.      Marland Kitchen doxazosin (CARDURA) 8 MG tablet Take 8 mg by mouth at bedtime.      . ferrous sulfate 325 (65 FE) MG tablet Take 325 mg by mouth daily with breakfast.      . folic acid (FOLVITE) 1 MG tablet Take 1 mg by mouth daily.      . furosemide (LASIX) 40 MG tablet Take 0.5 tablets (20 mg total) by mouth daily. 20 Mg daily except 40 MG on Tuesdays and Fridays  30 tablet  5  . loratadine (CLARITIN) 10 MG tablet Take 10 mg by mouth as needed for allergies.       . methotrexate (RHEUMATREX) 2.5 MG tablet Caution:Chemotherapy. Protect from light. Take as directed per Dr. Nickola Major Take 1-2 hrs between taking Aspirin and Methotrexate      . metoprolol tartrate (LOPRESSOR) 25 MG tablet Take 25 mg by mouth 2 (two) times daily. Take 1-2 hrs between taking Metoprolol and Verapamil      . mometasone-formoterol (DULERA) 100-5 MCG/ACT AERO Inhale 2 puffs into the lungs as needed for wheezing or shortness of breath.       Bertram Gala Glycol-Propyl Glycol (SYSTANE) 0.4-0.3 % SOLN Apply 1 drop to eye as needed (for dry eyes.).       Marland Kitchen Potassium Chloride ER 20 MEQ TBCR Take 2 tablets of 20 meq daily  60 tablet  6  . predniSONE (DELTASONE) 5 MG tablet Take 1.5 tablets      . traMADol (ULTRAM) 50 MG tablet Take 50 mg by mouth 3 (three) times daily as needed for moderate pain.       . VENTOLIN HFA 108 (90 BASE) MCG/ACT inhaler Inhale 2 puffs into the lungs every 6 (six) hours as needed for wheezing or shortness of breath.       . verapamil (COVERA HS) 240 MG (CO) 24 hr tablet Take 240 mg by mouth at bedtime. Take 1-2 hrs between taking Metoprolol and Verapamil.       No current facility-administered medications on file prior to visit.

## 2014-04-05 NOTE — Telephone Encounter (Signed)
Let her know to stay on oxygen at night, and let us know if we can help or if they change their minds

## 2014-04-05 NOTE — Telephone Encounter (Signed)
lmomtcb x1 for [employer]

## 2014-04-05 NOTE — Telephone Encounter (Signed)
Called spoke with daughter [name]. She reports they have decided for pt NOT to have sleep study done. Pt has been seeming confused, teary eyed, kind of out of out. Pt has been sleeping a lot.  The daughter reports pt advised her she did not want to do sleep study, she is just going to put everything in "God's hands". I advised will make [facility] aware. She wants to know if there is any other treatment than pt having to have the sleep study. She is not sure what else to do. Please advise KC thanks

## 2014-04-06 ENCOUNTER — Encounter (HOSPITAL_BASED_OUTPATIENT_CLINIC_OR_DEPARTMENT_OTHER): Payer: Medicare PPO

## 2014-04-08 ENCOUNTER — Encounter (HOSPITAL_COMMUNITY): Payer: Self-pay | Admitting: Emergency Medicine

## 2014-04-08 ENCOUNTER — Telehealth: Payer: Self-pay | Admitting: Pulmonary Disease

## 2014-04-08 ENCOUNTER — Inpatient Hospital Stay (HOSPITAL_COMMUNITY)
Admission: EM | Admit: 2014-04-08 | Discharge: 2014-04-14 | DRG: 853 | Disposition: A | Discharge status: Skilled Nursing Facility | Payer: Medicare PPO | Attending: Internal Medicine | Admitting: Internal Medicine

## 2014-04-08 ENCOUNTER — Inpatient Hospital Stay (HOSPITAL_COMMUNITY): Payer: Medicare PPO

## 2014-04-08 ENCOUNTER — Emergency Department (HOSPITAL_COMMUNITY): Payer: Medicare PPO

## 2014-04-08 DIAGNOSIS — R6520 Severe sepsis without septic shock: Secondary | ICD-10-CM

## 2014-04-08 DIAGNOSIS — J449 Chronic obstructive pulmonary disease, unspecified: Secondary | ICD-10-CM | POA: Diagnosis present

## 2014-04-08 DIAGNOSIS — Z7982 Long term (current) use of aspirin: Secondary | ICD-10-CM

## 2014-04-08 DIAGNOSIS — R404 Transient alteration of awareness: Secondary | ICD-10-CM | POA: Diagnosis present

## 2014-04-08 DIAGNOSIS — I330 Acute and subacute infective endocarditis: Secondary | ICD-10-CM | POA: Diagnosis present

## 2014-04-08 DIAGNOSIS — B356 Tinea cruris: Secondary | ICD-10-CM | POA: Diagnosis not present

## 2014-04-08 DIAGNOSIS — IMO0002 Reserved for concepts with insufficient information to code with codable children: Secondary | ICD-10-CM | POA: Diagnosis not present

## 2014-04-08 DIAGNOSIS — R6521 Severe sepsis with septic shock: Secondary | ICD-10-CM | POA: Diagnosis present

## 2014-04-08 DIAGNOSIS — I5032 Chronic diastolic (congestive) heart failure: Secondary | ICD-10-CM | POA: Diagnosis present

## 2014-04-08 DIAGNOSIS — I080 Rheumatic disorders of both mitral and aortic valves: Secondary | ICD-10-CM | POA: Diagnosis present

## 2014-04-08 DIAGNOSIS — T7840XA Allergy, unspecified, initial encounter: Secondary | ICD-10-CM

## 2014-04-08 DIAGNOSIS — G9340 Encephalopathy, unspecified: Secondary | ICD-10-CM

## 2014-04-08 DIAGNOSIS — F411 Generalized anxiety disorder: Secondary | ICD-10-CM | POA: Diagnosis present

## 2014-04-08 DIAGNOSIS — Z6832 Body mass index (BMI) 32.0-32.9, adult: Secondary | ICD-10-CM | POA: Diagnosis not present

## 2014-04-08 DIAGNOSIS — R410 Disorientation, unspecified: Secondary | ICD-10-CM

## 2014-04-08 DIAGNOSIS — N390 Urinary tract infection, site not specified: Secondary | ICD-10-CM | POA: Diagnosis present

## 2014-04-08 DIAGNOSIS — A412 Sepsis due to unspecified staphylococcus: Principal | ICD-10-CM | POA: Diagnosis present

## 2014-04-08 DIAGNOSIS — E669 Obesity, unspecified: Secondary | ICD-10-CM | POA: Diagnosis present

## 2014-04-08 DIAGNOSIS — I5189 Other ill-defined heart diseases: Secondary | ICD-10-CM

## 2014-04-08 DIAGNOSIS — N179 Acute kidney failure, unspecified: Secondary | ICD-10-CM | POA: Diagnosis present

## 2014-04-08 DIAGNOSIS — E872 Acidosis, unspecified: Secondary | ICD-10-CM

## 2014-04-08 DIAGNOSIS — Z9981 Dependence on supplemental oxygen: Secondary | ICD-10-CM | POA: Diagnosis not present

## 2014-04-08 DIAGNOSIS — D649 Anemia, unspecified: Secondary | ICD-10-CM | POA: Diagnosis present

## 2014-04-08 DIAGNOSIS — E875 Hyperkalemia: Secondary | ICD-10-CM | POA: Diagnosis present

## 2014-04-08 DIAGNOSIS — R7881 Bacteremia: Secondary | ICD-10-CM

## 2014-04-08 DIAGNOSIS — S31809A Unspecified open wound of unspecified buttock, initial encounter: Secondary | ICD-10-CM

## 2014-04-08 DIAGNOSIS — L03317 Cellulitis of buttock: Secondary | ICD-10-CM | POA: Diagnosis present

## 2014-04-08 DIAGNOSIS — R159 Full incontinence of feces: Secondary | ICD-10-CM | POA: Diagnosis present

## 2014-04-08 DIAGNOSIS — I498 Other specified cardiac arrhythmias: Secondary | ICD-10-CM | POA: Diagnosis not present

## 2014-04-08 DIAGNOSIS — B9561 Methicillin susceptible Staphylococcus aureus infection as the cause of diseases classified elsewhere: Secondary | ICD-10-CM

## 2014-04-08 DIAGNOSIS — I509 Heart failure, unspecified: Secondary | ICD-10-CM | POA: Diagnosis present

## 2014-04-08 DIAGNOSIS — E871 Hypo-osmolality and hyponatremia: Secondary | ICD-10-CM | POA: Diagnosis present

## 2014-04-08 DIAGNOSIS — R5381 Other malaise: Secondary | ICD-10-CM | POA: Diagnosis present

## 2014-04-08 DIAGNOSIS — A419 Sepsis, unspecified organism: Secondary | ICD-10-CM | POA: Diagnosis present

## 2014-04-08 DIAGNOSIS — Z87891 Personal history of nicotine dependence: Secondary | ICD-10-CM | POA: Diagnosis not present

## 2014-04-08 DIAGNOSIS — G4733 Obstructive sleep apnea (adult) (pediatric): Secondary | ICD-10-CM | POA: Diagnosis present

## 2014-04-08 DIAGNOSIS — N189 Chronic kidney disease, unspecified: Secondary | ICD-10-CM | POA: Diagnosis present

## 2014-04-08 DIAGNOSIS — J4489 Other specified chronic obstructive pulmonary disease: Secondary | ICD-10-CM | POA: Diagnosis present

## 2014-04-08 DIAGNOSIS — I129 Hypertensive chronic kidney disease with stage 1 through stage 4 chronic kidney disease, or unspecified chronic kidney disease: Secondary | ICD-10-CM | POA: Diagnosis present

## 2014-04-08 DIAGNOSIS — Z8249 Family history of ischemic heart disease and other diseases of the circulatory system: Secondary | ICD-10-CM

## 2014-04-08 DIAGNOSIS — I2789 Other specified pulmonary heart diseases: Secondary | ICD-10-CM | POA: Diagnosis present

## 2014-04-08 DIAGNOSIS — M069 Rheumatoid arthritis, unspecified: Secondary | ICD-10-CM | POA: Diagnosis present

## 2014-04-08 DIAGNOSIS — L0231 Cutaneous abscess of buttock: Secondary | ICD-10-CM

## 2014-04-08 LAB — URINALYSIS, ROUTINE W REFLEX MICROSCOPIC
Bilirubin Urine: NEGATIVE
GLUCOSE, UA: NEGATIVE mg/dL
Hgb urine dipstick: NEGATIVE
Ketones, ur: 15 mg/dL — AB
Nitrite: NEGATIVE
PH: 6.5 (ref 5.0–8.0)
Protein, ur: 30 mg/dL — AB
Specific Gravity, Urine: 1.022 (ref 1.005–1.030)
Urobilinogen, UA: 0.2 mg/dL (ref 0.0–1.0)

## 2014-04-08 LAB — BASIC METABOLIC PANEL
BUN: 18 mg/dL (ref 6–23)
BUN: 18 mg/dL (ref 6–23)
BUN: 16 mg/dL (ref 6–23)
CALCIUM: 9.9 mg/dL (ref 8.4–10.5)
CHLORIDE: 98 meq/L (ref 96–112)
CHLORIDE: 96 meq/L (ref 96–112)
CO2: 20 mEq/L (ref 19–32)
CO2: 21 meq/L (ref 19–32)
CO2: 21 meq/L (ref 19–32)
Calcium: 9.1 mg/dL (ref 8.4–10.5)
Calcium: 9.5 mg/dL (ref 8.4–10.5)
Chloride: 101 mEq/L (ref 96–112)
Creatinine, Ser: 1.61 mg/dL — ABNORMAL HIGH (ref 0.50–1.10)
Creatinine, Ser: 1.79 mg/dL — ABNORMAL HIGH (ref 0.50–1.10)
Creatinine, Ser: 1.37 mg/dL — ABNORMAL HIGH (ref 0.50–1.10)
GFR calc Af Amer: 33 mL/min — ABNORMAL LOW (ref >90)
GFR calc Af Amer: 29 mL/min — ABNORMAL LOW (ref >90)
GFR calc Af Amer: 40 mL/min — ABNORMAL LOW (ref >90)
GFR calc non Af Amer: 25 mL/min — ABNORMAL LOW (ref >90)
GFR calc non Af Amer: 35 mL/min — ABNORMAL LOW (ref >90)
GFR, EST NON AFRICAN AMERICAN: 29 mL/min — AB (ref >90)
GLUCOSE: 291 mg/dL — AB (ref 70–99)
GLUCOSE: 140 mg/dL — AB (ref 70–99)
Glucose, Bld: 120 mg/dL — ABNORMAL HIGH (ref 70–99)
POTASSIUM: 6.3 meq/L — AB (ref 3.7–5.3)
POTASSIUM: 5 meq/L (ref 3.7–5.3)
Potassium: 6.4 mEq/L — ABNORMAL HIGH (ref 3.7–5.3)
SODIUM: 130 meq/L — AB (ref 137–147)
SODIUM: 136 meq/L — AB (ref 137–147)
Sodium: 132 mEq/L — ABNORMAL LOW (ref 137–147)

## 2014-04-08 LAB — CBC WITH DIFFERENTIAL/PLATELET
Basophils Absolute: 0 10*3/uL (ref 0.0–0.1)
Basophils Absolute: 0 10*3/uL (ref 0.0–0.1)
Basophils Relative: 0 % (ref 0–1)
Basophils Relative: 0 % (ref 0–1)
EOS ABS: 0.2 10*3/uL (ref 0.0–0.7)
EOS PCT: 0 % (ref 0–5)
Eosinophils Absolute: 0 10*3/uL (ref 0.0–0.7)
Eosinophils Relative: 1 % (ref 0–5)
HCT: 31 % — ABNORMAL LOW (ref 36.0–46.0)
HEMATOCRIT: 35.2 % — AB (ref 36.0–46.0)
HEMOGLOBIN: 11.1 g/dL — AB (ref 12.0–15.0)
HEMOGLOBIN: 10 g/dL — AB (ref 12.0–15.0)
LYMPHS PCT: 15 % (ref 12–46)
Lymphocytes Relative: 10 % — ABNORMAL LOW (ref 12–46)
Lymphs Abs: 1.5 10*3/uL (ref 0.7–4.0)
Lymphs Abs: 2.1 10*3/uL (ref 0.7–4.0)
MCH: 31.7 pg (ref 26.0–34.0)
MCH: 31.7 pg (ref 26.0–34.0)
MCHC: 31.5 g/dL (ref 30.0–36.0)
MCHC: 32.3 g/dL (ref 30.0–36.0)
MCV: 100.6 fL — ABNORMAL HIGH (ref 78.0–100.0)
MCV: 98.4 fL (ref 78.0–100.0)
MONO ABS: 1.3 10*3/uL — AB (ref 0.1–1.0)
Monocytes Absolute: 1.1 10*3/uL — ABNORMAL HIGH (ref 0.1–1.0)
Monocytes Relative: 7 % (ref 3–12)
Monocytes Relative: 9 % (ref 3–12)
NEUTROS ABS: 12.5 10*3/uL — AB (ref 1.7–7.7)
Neutro Abs: 10.6 10*3/uL — ABNORMAL HIGH (ref 1.7–7.7)
Neutrophils Relative %: 82 % — ABNORMAL HIGH (ref 43–77)
Neutrophils Relative %: 76 % (ref 43–77)
Platelets: 154 10*3/uL (ref 150–400)
Platelets: 121 10*3/uL — ABNORMAL LOW (ref 150–400)
RBC: 3.5 MIL/uL — AB (ref 3.87–5.11)
RBC: 3.15 MIL/uL — AB (ref 3.87–5.11)
RDW: 17.7 % — ABNORMAL HIGH (ref 11.5–15.5)
RDW: 17.8 % — ABNORMAL HIGH (ref 11.5–15.5)
WBC: 15.3 10*3/uL — ABNORMAL HIGH (ref 4.0–10.5)
WBC: 14 10*3/uL — ABNORMAL HIGH (ref 4.0–10.5)

## 2014-04-08 LAB — BLOOD GAS, ARTERIAL
ACID-BASE DEFICIT: 4.1 mmol/L — AB (ref 0.0–2.0)
BICARBONATE: 19.9 meq/L — AB (ref 20.0–24.0)
Drawn by: 244861
O2 Content: 3 L/min
O2 Saturation: 91 %
PATIENT TEMPERATURE: 98.6
PCO2 ART: 32.8 mmHg — AB (ref 35.0–45.0)
TCO2: 20.9 mmol/L (ref 0–100)
pH, Arterial: 7.399 (ref 7.350–7.450)
pO2, Arterial: 65.5 mmHg — ABNORMAL LOW (ref 80.0–100.0)

## 2014-04-08 LAB — COMPREHENSIVE METABOLIC PANEL
ALT: 28 U/L (ref 0–35)
AST: 36 U/L (ref 0–37)
Albumin: 3.2 g/dL — ABNORMAL LOW (ref 3.5–5.2)
Alkaline Phosphatase: 62 U/L (ref 39–117)
BILIRUBIN TOTAL: 0.6 mg/dL (ref 0.3–1.2)
BUN: 17 mg/dL (ref 6–23)
CALCIUM: 9.7 mg/dL (ref 8.4–10.5)
CO2: 22 mEq/L (ref 19–32)
Chloride: 93 mEq/L — ABNORMAL LOW (ref 96–112)
Creatinine, Ser: 1.49 mg/dL — ABNORMAL HIGH (ref 0.50–1.10)
GFR calc Af Amer: 37 mL/min — ABNORMAL LOW (ref >90)
GFR calc non Af Amer: 32 mL/min — ABNORMAL LOW (ref >90)
GLUCOSE: 169 mg/dL — AB (ref 70–99)
POTASSIUM: 5.8 meq/L — AB (ref 3.7–5.3)
SODIUM: 130 meq/L — AB (ref 137–147)
Total Protein: 7.2 g/dL (ref 6.0–8.3)

## 2014-04-08 LAB — CK TOTAL AND CKMB (NOT AT ARMC)
CK, MB: 1.6 ng/mL (ref 0.3–4.0)
Relative Index: 0.7 (ref 0.0–2.5)
Total CK: 232 U/L — ABNORMAL HIGH (ref 7–177)

## 2014-04-08 LAB — HEPATIC FUNCTION PANEL
ALT: 63 U/L — ABNORMAL HIGH (ref 0–35)
AST: 96 U/L — AB (ref 0–37)
Albumin: 2.6 g/dL — ABNORMAL LOW (ref 3.5–5.2)
Alkaline Phosphatase: 60 U/L (ref 39–117)
BILIRUBIN TOTAL: 0.8 mg/dL (ref 0.3–1.2)
Bilirubin, Direct: 0.4 mg/dL — ABNORMAL HIGH (ref 0.0–0.3)
Indirect Bilirubin: 0.4 mg/dL (ref 0.3–0.9)
Total Protein: 5.7 g/dL — ABNORMAL LOW (ref 6.0–8.3)

## 2014-04-08 LAB — POCT I-STAT 3, ART BLOOD GAS (G3+)
Acid-base deficit: 4 mmol/L — ABNORMAL HIGH (ref 0.0–2.0)
BICARBONATE: 20.1 meq/L (ref 20.0–24.0)
O2 Saturation: 98 %
PCO2 ART: 34.3 mmHg — AB (ref 35.0–45.0)
TCO2: 21 mmol/L (ref 0–100)
pH, Arterial: 7.379 (ref 7.350–7.450)
pO2, Arterial: 111 mmHg — ABNORMAL HIGH (ref 80.0–100.0)

## 2014-04-08 LAB — I-STAT CG4 LACTIC ACID, ED: LACTIC ACID, VENOUS: 6.83 mmol/L — AB (ref 0.5–2.2)

## 2014-04-08 LAB — URINE MICROSCOPIC-ADD ON

## 2014-04-08 LAB — PROTIME-INR
INR: 1.44 (ref 0.00–1.49)
Prothrombin Time: 17.2 seconds — ABNORMAL HIGH (ref 11.6–15.2)

## 2014-04-08 LAB — SAMPLE TO BLOOD BANK

## 2014-04-08 LAB — TROPONIN I: Troponin I: <0.3 ng/mL (ref <0.30)

## 2014-04-08 LAB — CBC
HEMATOCRIT: 34.6 % — AB (ref 36.0–46.0)
HEMOGLOBIN: 11.3 g/dL — AB (ref 12.0–15.0)
MCH: 32 pg (ref 26.0–34.0)
MCHC: 32.7 g/dL (ref 30.0–36.0)
MCV: 98 fL (ref 78.0–100.0)
Platelets: 134 10*3/uL — ABNORMAL LOW (ref 150–400)
RBC: 3.53 MIL/uL — AB (ref 3.87–5.11)
RDW: 17.6 % — ABNORMAL HIGH (ref 11.5–15.5)
WBC: 13.1 10*3/uL — AB (ref 4.0–10.5)

## 2014-04-08 LAB — MRSA PCR SCREENING
MRSA BY PCR: NEGATIVE
MRSA by PCR: NEGATIVE

## 2014-04-08 LAB — PHOSPHORUS
PHOSPHORUS: 2.8 mg/dL (ref 2.3–4.6)
Phosphorus: 3 mg/dL (ref 2.3–4.6)

## 2014-04-08 LAB — PROCALCITONIN: PROCALCITONIN: 0.87 ng/mL

## 2014-04-08 LAB — MAGNESIUM
Magnesium: 2 mg/dL (ref 1.5–2.5)
Magnesium: 1.9 mg/dL (ref 1.5–2.5)

## 2014-04-08 LAB — POC OCCULT BLOOD, ED: FECAL OCCULT BLD: POSITIVE — AB

## 2014-04-08 LAB — GLUCOSE, CAPILLARY: Glucose-Capillary: 126 mg/dL — ABNORMAL HIGH (ref 70–99)

## 2014-04-08 LAB — PRO B NATRIURETIC PEPTIDE: Pro B Natriuretic peptide (BNP): 5143 pg/mL — ABNORMAL HIGH (ref 0–450)

## 2014-04-08 MED ORDER — SODIUM CHLORIDE 0.9 % IV BOLUS (SEPSIS): 1000.0000 mL | Freq: Once | INTRAVENOUS | Status: DC

## 2014-04-08 MED ORDER — CALCIUM CHLORIDE 10 % IV SOLN: 1.0000 g | Freq: Once | INTRAVENOUS | Status: DC

## 2014-04-08 MED ORDER — IPRATROPIUM-ALBUTEROL 0.5-2.5 (3) MG/3ML IN SOLN: 3.0000 mL | RESPIRATORY_TRACT | Status: DC | PRN

## 2014-04-08 MED ORDER — DEXTROSE 50 % IV SOLN
1.0000 | Freq: Once | INTRAVENOUS | Status: AC
  Administered 2014-04-08: 50 mL via INTRAVENOUS

## 2014-04-08 MED ORDER — PIPERACILLIN-TAZOBACTAM 3.375 G IVPB
3.3750 g | Freq: Three times a day (TID) | INTRAVENOUS | Status: DC
  Administered 2014-04-08 – 2014-04-10 (×7): 3.375 g via INTRAVENOUS
  Filled 2014-04-08 (×9): qty 50

## 2014-04-08 MED ORDER — INSULIN ASPART 100 UNIT/ML IV SOLN
10.0000 [IU] | Freq: Once | INTRAVENOUS | Status: AC
  Administered 2014-04-08: 10 [IU] via INTRAVENOUS

## 2014-04-08 MED ORDER — SODIUM CHLORIDE 0.9 % IV SOLN: INTRAVENOUS | Status: DC

## 2014-04-08 MED ORDER — HYDROCORTISONE NA SUCCINATE PF 100 MG IJ SOLR
50.0000 mg | Freq: Four times a day (QID) | INTRAMUSCULAR | Status: DC
  Administered 2014-04-08 – 2014-04-09 (×4): 50 mg via INTRAVENOUS
  Filled 2014-04-08 (×8): qty 1

## 2014-04-08 MED ORDER — SODIUM POLYSTYRENE SULFONATE 15 GM/60ML PO SUSP
30.0000 g | Freq: Once | ORAL | Status: AC
  Administered 2014-04-08: 30 g via ORAL
  Filled 2014-04-08: qty 120

## 2014-04-08 MED ORDER — VANCOMYCIN HCL IN DEXTROSE 1-5 GM/200ML-% IV SOLN
1000.0000 mg | INTRAVENOUS | Status: DC
  Administered 2014-04-08 – 2014-04-10 (×3): 1000 mg via INTRAVENOUS
  Filled 2014-04-08 (×4): qty 200

## 2014-04-08 MED ORDER — HEPARIN SODIUM (PORCINE) 5000 UNIT/ML IJ SOLN
5000.0000 [IU] | Freq: Three times a day (TID) | INTRAMUSCULAR | Status: DC
  Administered 2014-04-08 – 2014-04-14 (×16): 5000 [IU] via SUBCUTANEOUS
  Filled 2014-04-08 (×24): qty 1

## 2014-04-08 MED ORDER — ALBUTEROL SULFATE (2.5 MG/3ML) 0.083% IN NEBU
INHALATION_SOLUTION | RESPIRATORY_TRACT | Status: AC
  Administered 2014-04-08: 2.5 mg via RESPIRATORY_TRACT
  Filled 2014-04-08: qty 3

## 2014-04-08 MED ORDER — CLINDAMYCIN PHOSPHATE 600 MG/50ML IV SOLN
600.0000 mg | Freq: Four times a day (QID) | INTRAVENOUS | Status: DC
  Administered 2014-04-08 – 2014-04-09 (×3): 600 mg via INTRAVENOUS
  Filled 2014-04-08 (×5): qty 50

## 2014-04-08 MED ORDER — SODIUM CHLORIDE 0.9 % IV SOLN: 250.0000 mL | INTRAVENOUS | Status: DC | PRN

## 2014-04-08 MED ORDER — SODIUM CHLORIDE 0.9 % IV BOLUS (SEPSIS)
1000.0000 mL | Freq: Once | INTRAVENOUS | Status: AC
  Administered 2014-04-08: 1000 mL via INTRAVENOUS

## 2014-04-08 MED ORDER — INSULIN ASPART 100 UNIT/ML ~~LOC~~ SOLN: 10.0000 [IU] | Freq: Once | SUBCUTANEOUS | Status: DC

## 2014-04-08 MED ORDER — CALCIUM GLUCONATE 10 % IV SOLN: 1.0000 g | Freq: Once | INTRAVENOUS | Status: DC

## 2014-04-08 MED ORDER — SODIUM CHLORIDE 0.9 % IV BOLUS (SEPSIS): 2000.0000 mL | Freq: Once | INTRAVENOUS | Status: DC

## 2014-04-08 MED ORDER — SODIUM CHLORIDE 0.9 % IV BOLUS (SEPSIS)
1000.0000 mL | INTRAVENOUS | Status: DC | PRN
  Administered 2014-04-08: 1000 mL via INTRAVENOUS

## 2014-04-08 MED ORDER — DEXTROSE 5 % IV SOLN
1.0000 g | Freq: Once | INTRAVENOUS | Status: AC
  Administered 2014-04-08: 1 g via INTRAVENOUS
  Filled 2014-04-08: qty 10

## 2014-04-08 MED ORDER — DEXTROSE 50 % IV SOLN
INTRAVENOUS | Status: AC
  Filled 2014-04-08: qty 50

## 2014-04-08 MED ORDER — CALCIUM CHLORIDE 10 % IV SOLN
1.0000 g | Freq: Once | INTRAVENOUS | Status: AC
Start: 2014-04-08 — End: 2014-04-08
  Administered 2014-04-08: 1 g via INTRAVENOUS

## 2014-04-08 MED ORDER — SODIUM BICARBONATE 8.4 % IV SOLN
INTRAVENOUS | Status: DC
  Administered 2014-04-08 (×2): via INTRAVENOUS
  Filled 2014-04-08 (×2): qty 150

## 2014-04-08 MED ORDER — ALBUTEROL SULFATE (2.5 MG/3ML) 0.083% IN NEBU
2.5000 mg | INHALATION_SOLUTION | RESPIRATORY_TRACT | Status: DC
  Administered 2014-04-08 – 2014-04-10 (×14): 2.5 mg via RESPIRATORY_TRACT
  Filled 2014-04-08 (×13): qty 3

## 2014-04-08 MED ORDER — SODIUM POLYSTYRENE SULFONATE 15 GM/60ML PO SUSP: 30.0000 g | Freq: Once | ORAL | Status: AC

## 2014-04-08 MED FILL — Medication: Qty: 1 | Status: AC

## 2014-04-08 NOTE — Progress Notes (Signed)
  Recent Labs Lab 04/08/14 0225 04/08/14 0700 04/08/14 0805 04/08/14 1500  K 5.8* 6.3* 6.4* 5.0    Recent Labs Lab 04/08/14 0225 04/08/14 0700 04/08/14 0805 04/08/14 1500  CREATININE 1.49* 1.61* 1.79* 1.37*     Improving k and creat  Plan Dc bicarb  Dr. Kalman Shan, M.D., [facility].C.P Pulmonary and Critical Care Medicine Staff Physician [facility] [place] [facility] Pager: [phone], If no answer or between  15:00h - 7:00h: call [phone]  04/08/2014 6:33 PM

## 2014-04-08 NOTE — Progress Notes (Addendum)
Pt to TX to 2H06, family updated & are aware of TX. Monica Blankenship is HPOA/POA? & is designated 'main contact person for the family. Ph: [phone]

## 2014-04-08 NOTE — ED Notes (Signed)
Pt with a C/O of SOB.Noted with BM with blood stain to her underwear. Alert and oriented x4

## 2014-04-08 NOTE — Progress Notes (Signed)
Upon morning assessment pt was found to be bradycardic with a heart rate of 48, pt was alert and oriented at this time. Attempted to obtain 12 lead EKG but pt's heart rate began to further decrease with a rate as low as 26 and pt was no longer responsive and diaphoretic. Primary MD was making rounds at this time, code was called.

## 2014-04-08 NOTE — Consult Note (Signed)
Reason for Consult:sacral wound Referring Physician: Dr. Kelby Fam is an 80 y.o. female.  HPI: patient was admitted with suspected urosepsis. She had a bradycardic arrest this morning. She responded to medical treatment and was transferred to the ICU. She was noted to have a sacral wound. Wound ostomy nurse recommended surgical consultation. She underwent a CT scan which demonstrates a sacral decubitus with some edema/inflammation. I reviewed with the radiologist. There is no evidence of abscess. There is no evidence of necrotizing infection. Patient claims she's been having pain in this area for several days. We're asked to evaluate this wound from a surgical standpoint.  Past Medical History  Diagnosis Date  . Allergy   . Anxiety   . Insomnia   . Osteoarthritis   . Anemia   . Fatigue   . Obesity   . Diastolic dysfunction   . CKD (chronic kidney disease)   . Mitral stenosis     with moderate MR on echo 4/14  . Aortic stenosis     mild by echo 4/14  . Aortic regurgitation     mild to moderate by echo 4/14  . HTN (hypertension)   . Chronic diastolic CHF (congestive heart failure)     Past Surgical History  Procedure Laterality Date  . Eye surgery    . Abdominal hysterectomy    . Appendectomy    . Cataract extraction, bilateral      Family History  Problem Relation Age of Onset  . Hypertension Mother   . CVA Mother     Social History:  reports that she has quit smoking. She has never used smokeless tobacco. She reports that she does not drink alcohol or use illicit drugs.  Allergies: No Known Allergies  Medications:  Scheduled: . albuterol  2.5 mg Nebulization Q4H  . calcium gluconate  1 g Intravenous Once  . clindamycin (CLEOCIN) IV  600 mg Intravenous 4 times per day  . heparin  5,000 Units Subcutaneous 3 times per day  . hydrocortisone sodium succinate  50 mg Intravenous Q6H  . piperacillin-tazobactam (ZOSYN)  IV  3.375 g Intravenous 3 times per  day  . sodium chloride  1,000 mL Intravenous Once  . sodium chloride  2,000 mL Intravenous Once  . vancomycin  1,000 mg Intravenous Q24H   Continuous: . sodium chloride 100 mL/hr at 04/08/14 0830   LKJ:ZPHXTA chloride, ipratropium-albuterol  Results for orders placed during the hospital encounter of 04/08/14 (from the past 48 hour(s))  CBC WITH DIFFERENTIAL     Status: Abnormal   Collection Time    04/08/14  2:25 AM      Result Value Ref Range   WBC 15.3 (*) 4.0 - 10.5 K/uL   Comment: WHITE COUNT CONFIRMED ON SMEAR   RBC 3.50 (*) 3.87 - 5.11 MIL/uL   Hemoglobin 11.1 (*) 12.0 - 15.0 g/dL   HCT 35.2 (*) 36.0 - 46.0 %   MCV 100.6 (*) 78.0 - 100.0 fL   MCH 31.7  26.0 - 34.0 pg   MCHC 31.5  30.0 - 36.0 g/dL   RDW 17.7 (*) 11.5 - 15.5 %   Platelets 154  150 - 400 K/uL   Comment: PLATELET COUNT CONFIRMED BY SMEAR   Neutrophils Relative % 82 (*) 43 - 77 %   Lymphocytes Relative 10 (*) 12 - 46 %   Monocytes Relative 7  3 - 12 %   Eosinophils Relative 1  0 - 5 %   Basophils Relative  0  0 - 1 %   Neutro Abs 12.5 (*) 1.7 - 7.7 K/uL   Lymphs Abs 1.5  0.7 - 4.0 K/uL   Monocytes Absolute 1.1 (*) 0.1 - 1.0 K/uL   Eosinophils Absolute 0.2  0.0 - 0.7 K/uL   Basophils Absolute 0.0  0.0 - 0.1 K/uL   RBC Morphology ELLIPTOCYTES     Comment: POLYCHROMASIA PRESENT     BURR CELLS   WBC Morphology MILD LEFT SHIFT (1-5% METAS, OCC MYELO, OCC BANDS)    COMPREHENSIVE METABOLIC PANEL     Status: Abnormal   Collection Time    04/08/14  2:25 AM      Result Value Ref Range   Sodium 130 (*) 137 - 147 mEq/L   Potassium 5.8 (*) 3.7 - 5.3 mEq/L   Chloride 93 (*) 96 - 112 mEq/L   CO2 22  19 - 32 mEq/L   Glucose, Bld 169 (*) 70 - 99 mg/dL   BUN 17  6 - 23 mg/dL   Creatinine, Ser 1.49 (*) 0.50 - 1.10 mg/dL   Calcium 9.7  8.4 - 10.5 mg/dL   Total Protein 7.2  6.0 - 8.3 g/dL   Albumin 3.2 (*) 3.5 - 5.2 g/dL   AST 36  0 - 37 U/L   ALT 28  0 - 35 U/L   Alkaline Phosphatase 62  39 - 117 U/L   Total  Bilirubin 0.6  0.3 - 1.2 mg/dL   GFR calc non Af Amer 32 (*) >90 mL/min   GFR calc Af Amer 37 (*) >90 mL/min   Comment: (NOTE)     The eGFR has been calculated using the CKD EPI equation.     This calculation has not been validated in all clinical situations.     eGFR's persistently <90 mL/min signify possible Chronic Kidney     Disease.  SAMPLE TO BLOOD BANK     Status: None   Collection Time    04/08/14  2:46 AM      Result Value Ref Range   Blood Bank Specimen SAMPLE AVAILABLE FOR TESTING     Sample Expiration 04/09/2014    I-STAT CG4 LACTIC ACID, ED     Status: Abnormal   Collection Time    04/08/14  2:58 AM      Result Value Ref Range   Lactic Acid, Venous 6.83 (*) 0.5 - 2.2 mmol/L  POC OCCULT BLOOD, ED     Status: Abnormal   Collection Time    04/08/14  3:11 AM      Result Value Ref Range   Fecal Occult Bld POSITIVE (*) NEGATIVE  URINALYSIS, ROUTINE W REFLEX MICROSCOPIC     Status: Abnormal   Collection Time    04/08/14  3:20 AM      Result Value Ref Range   Color, Urine YELLOW  YELLOW   APPearance CLOUDY (*) CLEAR   Specific Gravity, Urine 1.022  1.005 - 1.030   pH 6.5  5.0 - 8.0   Glucose, UA NEGATIVE  NEGATIVE mg/dL   Hgb urine dipstick NEGATIVE  NEGATIVE   Bilirubin Urine NEGATIVE  NEGATIVE   Ketones, ur 15 (*) NEGATIVE mg/dL   Protein, ur 30 (*) NEGATIVE mg/dL   Urobilinogen, UA 0.2  0.0 - 1.0 mg/dL   Nitrite NEGATIVE  NEGATIVE   Leukocytes, UA SMALL (*) NEGATIVE  URINE MICROSCOPIC-ADD ON     Status: Abnormal   Collection Time    04/08/14  3:20 AM  Result Value Ref Range   Squamous Epithelial / LPF RARE  RARE   WBC, UA 11-20  <3 WBC/hpf   RBC / HPF 0-2  <3 RBC/hpf   Bacteria, UA MANY (*) RARE   Casts HYALINE CASTS (*) NEGATIVE  MAGNESIUM     Status: None   Collection Time    04/08/14  7:00 AM      Result Value Ref Range   Magnesium 2.0  1.5 - 2.5 mg/dL  PHOSPHORUS     Status: None   Collection Time    04/08/14  7:00 AM      Result Value Ref  Range   Phosphorus 2.8  2.3 - 4.6 mg/dL  BASIC METABOLIC PANEL     Status: Abnormal   Collection Time    04/08/14  7:00 AM      Result Value Ref Range   Sodium 132 (*) 137 - 147 mEq/L   Potassium 6.3 (*) 3.7 - 5.3 mEq/L   Chloride 98  96 - 112 mEq/L   CO2 20  19 - 32 mEq/L   Glucose, Bld 120 (*) 70 - 99 mg/dL   BUN 18  6 - 23 mg/dL   Creatinine, Ser 1.61 (*) 0.50 - 1.10 mg/dL   Calcium 9.1  8.4 - 10.5 mg/dL   GFR calc non Af Amer 29 (*) >90 mL/min   GFR calc Af Amer 33 (*) >90 mL/min   Comment: (NOTE)     The eGFR has been calculated using the CKD EPI equation.     This calculation has not been validated in all clinical situations.     eGFR's persistently <90 mL/min signify possible Chronic Kidney     Disease.  CBC     Status: Abnormal   Collection Time    04/08/14  7:00 AM      Result Value Ref Range   WBC 13.1 (*) 4.0 - 10.5 K/uL   RBC 3.53 (*) 3.87 - 5.11 MIL/uL   Hemoglobin 11.3 (*) 12.0 - 15.0 g/dL   HCT 34.6 (*) 36.0 - 46.0 %   MCV 98.0  78.0 - 100.0 fL   MCH 32.0  26.0 - 34.0 pg   MCHC 32.7  30.0 - 36.0 g/dL   RDW 17.6 (*) 11.5 - 15.5 %   Platelets 134 (*) 150 - 400 K/uL  MRSA PCR SCREENING     Status: None   Collection Time    04/08/14  7:00 AM      Result Value Ref Range   MRSA by PCR NEGATIVE  NEGATIVE   Comment:            The GeneXpert MRSA Assay (FDA     approved for NASAL specimens     only), is one component of a     comprehensive MRSA colonization     surveillance program. It is not     intended to diagnose MRSA     infection nor to guide or     monitor treatment for     MRSA infections.  GLUCOSE, CAPILLARY     Status: Abnormal   Collection Time    04/08/14  7:54 AM      Result Value Ref Range   Glucose-Capillary 126 (*) 70 - 99 mg/dL  BASIC METABOLIC PANEL     Status: Abnormal   Collection Time    04/08/14  8:05 AM      Result Value Ref Range   Sodium 130 (*) 137 - 147 mEq/L  Potassium 6.4 (*) 3.7 - 5.3 mEq/L   Chloride 96  96 - 112 mEq/L     CO2 21  19 - 32 mEq/L   Glucose, Bld 291 (*) 70 - 99 mg/dL   BUN 18  6 - 23 mg/dL   Creatinine, Ser 1.79 (*) 0.50 - 1.10 mg/dL   Calcium 9.9  8.4 - 10.5 mg/dL   GFR calc non Af Amer 25 (*) >90 mL/min   GFR calc Af Amer 29 (*) >90 mL/min   Comment: (NOTE)     The eGFR has been calculated using the CKD EPI equation.     This calculation has not been validated in all clinical situations.     eGFR's persistently <90 mL/min signify possible Chronic Kidney     Disease.  PROTIME-INR     Status: Abnormal   Collection Time    04/08/14  8:05 AM      Result Value Ref Range   Prothrombin Time 17.2 (*) 11.6 - 15.2 seconds   INR 1.44  0.00 - 1.49  HEPATIC FUNCTION PANEL     Status: Abnormal   Collection Time    04/08/14  8:05 AM      Result Value Ref Range   Total Protein 5.7 (*) 6.0 - 8.3 g/dL   Albumin 2.6 (*) 3.5 - 5.2 g/dL   AST 96 (*) 0 - 37 U/L   ALT 63 (*) 0 - 35 U/L   Alkaline Phosphatase 60  39 - 117 U/L   Total Bilirubin 0.8  0.3 - 1.2 mg/dL   Bilirubin, Direct 0.4 (*) 0.0 - 0.3 mg/dL   Indirect Bilirubin 0.4  0.3 - 0.9 mg/dL  CBC WITH DIFFERENTIAL     Status: Abnormal   Collection Time    04/08/14  8:05 AM      Result Value Ref Range   WBC 14.0 (*) 4.0 - 10.5 K/uL   RBC 3.15 (*) 3.87 - 5.11 MIL/uL   Hemoglobin 10.0 (*) 12.0 - 15.0 g/dL   HCT 31.0 (*) 36.0 - 46.0 %   MCV 98.4  78.0 - 100.0 fL   MCH 31.7  26.0 - 34.0 pg   MCHC 32.3  30.0 - 36.0 g/dL   RDW 17.8 (*) 11.5 - 15.5 %   Platelets 121 (*) 150 - 400 K/uL   Neutrophils Relative % 76  43 - 77 %   Neutro Abs 10.6 (*) 1.7 - 7.7 K/uL   Lymphocytes Relative 15  12 - 46 %   Lymphs Abs 2.1  0.7 - 4.0 K/uL   Monocytes Relative 9  3 - 12 %   Monocytes Absolute 1.3 (*) 0.1 - 1.0 K/uL   Eosinophils Relative 0  0 - 5 %   Eosinophils Absolute 0.0  0.0 - 0.7 K/uL   Basophils Relative 0  0 - 1 %   Basophils Absolute 0.0  0.0 - 0.1 K/uL  MAGNESIUM     Status: None   Collection Time    04/08/14  8:05 AM      Result Value  Ref Range   Magnesium 1.9  1.5 - 2.5 mg/dL  PHOSPHORUS     Status: None   Collection Time    04/08/14  8:05 AM      Result Value Ref Range   Phosphorus 3.0  2.3 - 4.6 mg/dL  TROPONIN I     Status: None   Collection Time    04/08/14  8:07 AM  Result Value Ref Range   Troponin I <0.30  <0.30 ng/mL   Comment:            Due to the release kinetics of cTnI,     a negative result within the first hours     of the onset of symptoms does not rule out     myocardial infarction with certainty.     If myocardial infarction is still suspected,     repeat the test at appropriate intervals.  PRO B NATRIURETIC PEPTIDE     Status: Abnormal   Collection Time    04/08/14  8:07 AM      Result Value Ref Range   Pro B Natriuretic peptide (BNP) 5143.0 (*) 0 - 450 pg/mL  PROCALCITONIN     Status: None   Collection Time    04/08/14  8:07 AM      Result Value Ref Range   Procalcitonin 0.87     Comment:            Interpretation:     PCT > 0.5 ng/mL and <= 2 ng/mL:     Systemic infection (sepsis) is possible,     but other conditions are known to elevate     PCT as well.     (NOTE)             ICU PCT Algorithm               Non ICU PCT Algorithm        ----------------------------     ------------------------------             PCT < 0.25 ng/mL                 PCT < 0.1 ng/mL         Stopping of antibiotics            Stopping of antibiotics           strongly encouraged.               strongly encouraged.        ----------------------------     ------------------------------           PCT level decrease by               PCT < 0.25 ng/mL           >= 80% from peak PCT           OR PCT 0.25 - 0.5 ng/mL          Stopping of antibiotics                                                 encouraged.         Stopping of antibiotics               encouraged.        ----------------------------     ------------------------------           PCT level decrease by              PCT >= 0.25 ng/mL            < 80% from peak PCT            AND PCT >= 0.5 ng/mL  Continuing antibiotics                                                  encouraged.           Continuing antibiotics                encouraged.        ----------------------------     ------------------------------         PCT level increase compared          PCT > 0.5 ng/mL             with peak PCT AND              PCT >= 0.5 ng/mL             Escalation of antibiotics                                              strongly encouraged.          Escalation of antibiotics            strongly encouraged.  BLOOD GAS, ARTERIAL     Status: Abnormal   Collection Time    04/08/14  8:10 AM      Result Value Ref Range   O2 Content 3.0     Delivery systems NASAL CANNULA     pH, Arterial 7.399  7.350 - 7.450   pCO2 arterial 32.8 (*) 35.0 - 45.0 mmHg   pO2, Arterial 65.5 (*) 80.0 - 100.0 mmHg   Bicarbonate 19.9 (*) 20.0 - 24.0 mEq/L   TCO2 20.9  0 - 100 mmol/L   Acid-base deficit 4.1 (*) 0.0 - 2.0 mmol/L   O2 Saturation 91.0     Patient temperature 98.6     Collection site RIGHT RADIAL     Drawn by [phone]     Sample type ARTERIAL DRAW     Allens test (pass/fail) PASS  PASS  MRSA PCR SCREENING     Status: None   Collection Time    04/08/14  8:39 AM      Result Value Ref Range   MRSA by PCR NEGATIVE  NEGATIVE   Comment:            The GeneXpert MRSA Assay (FDA     approved for NASAL specimens     only), is one component of a     comprehensive MRSA colonization     surveillance program. It is not     intended to diagnose MRSA     infection nor to guide or     monitor treatment for     MRSA infections.  POCT I-STAT 3, ART BLOOD GAS (G3+)     Status: Abnormal   Collection Time    04/08/14  9:06 AM      Result Value Ref Range   pH, Arterial 7.379  7.350 - 7.450   pCO2 arterial 34.3 (*) 35.0 - 45.0 mmHg   pO2, Arterial 111.0 (*) 80.0 - 100.0 mmHg   Bicarbonate 20.1  20.0 - 24.0 mEq/L   TCO2 21  0 - 100 mmol/L   O2  Saturation 98.0     Acid-base deficit 4.0 (*)  0.0 - 2.0 mmol/L   Patient temperature 99.9 F     Collection site RADIAL, ALLEN'S TEST ACCEPTABLE     Drawn by VENIPUNCTURE     Sample type ARTERIAL    BASIC METABOLIC PANEL     Status: Abnormal   Collection Time    04/08/14  3:00 PM      Result Value Ref Range   Sodium 136 (*) 137 - 147 mEq/L   Potassium 5.0  3.7 - 5.3 mEq/L   Comment: DELTA CHECK NOTED   Chloride 101  96 - 112 mEq/L   CO2 21  19 - 32 mEq/L   Glucose, Bld 140 (*) 70 - 99 mg/dL   BUN 16  6 - 23 mg/dL   Creatinine, Ser 1.37 (*) 0.50 - 1.10 mg/dL   Calcium 9.5  8.4 - 10.5 mg/dL   GFR calc non Af Amer 35 (*) >90 mL/min   GFR calc Af Amer 40 (*) >90 mL/min   Comment: (NOTE)     The eGFR has been calculated using the CKD EPI equation.     This calculation has not been validated in all clinical situations.     eGFR's persistently <90 mL/min signify possible Chronic Kidney     Disease.  CK TOTAL AND CKMB     Status: Abnormal   Collection Time    04/08/14  3:00 PM      Result Value Ref Range   Total CK 232 (*) 7 - 177 U/L   CK, MB 1.6  0.3 - 4.0 ng/mL   Relative Index 0.7  0.0 - 2.5    Ct Abdomen Pelvis Wo Contrast  04/08/2014   CLINICAL DATA:  Sacral decubitus ulcer.  EXAM: CT ABDOMEN AND PELVIS WITHOUT CONTRAST  TECHNIQUE: Multidetector CT imaging of the abdomen and pelvis was performed following the standard protocol without IV contrast.  COMPARISON:  None.  FINDINGS: The lung bases are grossly clear. Moderate breathing motion artifact. The heart is enlarged. No pericardial effusion. Dense coronary artery calcifications are noted. There is a moderate-sized hiatal hernia.  The liver is grossly normal. No focal lesions or biliary dilatation. The gallbladder is grossly normal no common bile duct dilatation. The pancreas is unremarkable. The spleen is normal in size. No focal lesions. The adrenal glands kidneys are unremarkable. A small right renal artery aneurysm is suspected.  There is a large lower pole left renal cyst.  The stomach, duodenum, small bowel and colon are grossly normal. There is fluid-filled small bowel and colon but no obstruction. Contrast gets all the way to the colon. Does this patient have diarrhea? No mesenteric or retroperitoneal mass or adenopathy. Advanced atherosclerotic calcifications involving the aorta and branch vessels.  The bladder is moderately distended. No pelvic mass or adenopathy. Mild cystocele.  There is marked inflammatory change and probable granulation tissue overlying the sacrum. This appears to grow right down to the bone but I do not see any obvious changes of osteomyelitis. MRI would be more sensitive if the patient does not improve or worsens. No intrapelvic abscess. The lumbar vertebral bodies are normally aligned. There is severe facet disease noted.  IMPRESSION: Marked edema, inflammation and subcutaneous soft tissue thickening overlying the sacrum without discrete abscess or osteomyelitis. MRI would be more sensitive for further evaluation if the patient is not improve or worsens.  Moderate distention of the bladder.  Slightly dilated fluid feel small bowel and colon. This could represent enterocolitis. No findings for obstruction.  Advanced atherosclerotic calcifications  involving the aorta and branch vessels.  Moderate-sized hiatal hernia.   Electronically Signed   By: Kalman Jewels M.D.   On: 04/08/2014 16:54   Dg Chest Port 1 View  04/08/2014   CLINICAL DATA:  80 year old female with acute mental status change. Initial encounter.  EXAM: PORTABLE CHEST - 1 VIEW  COMPARISON:  0304 hr the same day and earlier.  FINDINGS: Portable AP semi upright view at 0842 hr. The patient is now rotated to the right. Stable mediastinal contours allowing for the different positioning. Mildly lower lung volumes. No pneumothorax or pleural effusion. Stable increased interstitial markings. No overt edema or consolidation.  IMPRESSION: Mildly lower lung  volumes, but otherwise stable allowing for differences in patient positioning.   Electronically Signed   By: Lars Pinks M.D.   On: 04/08/2014 08:58   Dg Chest Port 1 View  04/08/2014   CLINICAL DATA:  Fever and shortness of breath  EXAM: PORTABLE CHEST - 1 VIEW  COMPARISON:  02/07/2014  FINDINGS: Chronic cardiopericardial enlargement. Diffuse and symmetric interstitial coarsening which is above baseline when compared to previous. No effusion or pneumothorax. No evidence of consolidation.  IMPRESSION: 1. No definitive pneumonia. 2. Suspect mild CHF.   Electronically Signed   By: Jorje Guild M.D.   On: 04/08/2014 03:48    Review of Systems  Unable to perform ROS: acuity of condition   Blood pressure 142/54, pulse 96, temperature 99.1 F (37.3 C), temperature source Oral, resp. rate 17, height 5' 2"  (1.575 m), weight 182 lb 12.2 oz (82.9 kg), SpO2 95.00%. Physical Exam  Constitutional: She is oriented to person, place, and time. She appears well-developed and well-nourished. No distress.  HENT:  Head: Normocephalic.  Neck: No tracheal deviation present.  Cardiovascular: Normal rate and normal heart sounds.   Respiratory: Effort normal and breath sounds normal. No stridor. No respiratory distress. She has no wheezes.  GI: Soft. She exhibits no distension. There is no tenderness. There is no rebound and no guarding.  Genitourinary:     Right sacral decubitus wound with superficial skin breakdown, mild surrounding erythema, no crepitance, no purulent drainage  Neurological: She is alert and oriented to person, place, and time. She exhibits normal muscle tone.  Skin: Skin is warm.  See above    Assessment/Plan: Sacral decubitus with mild associated cellulitis. No evidence of abscess nor necrotizing infection. Continue IV antibiotics. We will follow closely. No need for emergent debridement, which is fortunate, due to her recent cardiac issues. I discussed her care with the CCM team.  Zenovia Jarred 04/08/2014, 7:13 PM

## 2014-04-08 NOTE — Care Management Note (Signed)
    Page 1 of 1   04/08/2014     8:51:51 AM CARE MANAGEMENT NOTE 04/08/2014  Patient:  [facility]   Account Number:  [id number]  Date Initiated:  04/08/2014  Documentation initiated by:  Junius Creamer  Subjective/Objective Assessment:   adm w sepsis     Action/Plan:   lives w husband, pcp dr Jerelyn Scott hussain   Anticipated DC Date:     Anticipated DC Plan:           Choice offered to / List presented to:             Status of service:   Medicare Important Message given?   (If response is "NO", the following Medicare IM given date fields will be blank) Date Medicare IM given:   Date Additional Medicare IM given:    Discharge Disposition:    Per UR Regulation:  Reviewed for med. necessity/level of care/duration of stay  If discussed at Long Length of Stay Meetings, dates discussed:    Comments:

## 2014-04-08 NOTE — Progress Notes (Signed)
Called report to Joy RN on 2 heart.

## 2014-04-08 NOTE — Consult Note (Addendum)
WOC wound consult note Reason for Consult: Consult requested for left buttocks wound.  Pt is septic and could possibly be the source. She cannot answer questions at this time regarding etiology or how many days the wound has been present. Wound type: Full thickness Pressure Ulcer POA: Present on admission but appearance is NOT consistent with a pressure ulcer.  This is not located over bony prominence. Measurement:16X8 cm across left buttocks Wound bed: Fluctuant; dark purple skin with full thickness patchy areas of yellow wound bed. Drainage (amount, consistency, odor) Mod amt thick tan drainage oozing out of open areas. Periwound:  surrounded by erythremia tracking upwards towards gluteal fold to 5 cm. Dressing procedure/placement/frequency: Foam dressing to absorb drainage until further input from surgical team.  Pt on Sport low-air loss bed to reduce pressure.  Discussed plan of care with primary team. Wound appearance, location,  and septic systemic picture are consistent with possible abscess or [place] Gangrene.  Recommend CT scan and CCS consult for further assessment and plan of care. Please re-consult if further assistance is needed.  Thank-you,  Cammie Mcgee MSN, RN, CWOCN, [place], CNS [phone]

## 2014-04-08 NOTE — Progress Notes (Signed)
Wound care RN Dawn suspcted deep sacral abscess v fournier  Plan Check ct Check ck  Dr. Kalman Shan, M.D., [facility].P Pulmonary and Critical Care Medicine Staff Physician [facility] [place] [facility] Pager: [phone], If no answer or between  15:00h - 7:00h: call [phone]  04/08/2014 11:40 AM

## 2014-04-08 NOTE — H&P (Addendum)
PULMONARY / CRITICAL CARE MEDICINE   Name: Monica Blankenship MRN: [id number] DOB: 22-Oct-1929    ADMISSION DATE:  04/08/2014 CONSULTATION DATE:  04/08/14  REFERRING MD :  EDP PRIMARY SERVICE: PCCM  CHIEF COMPLAINT:  Sepsis  BRIEF PATIENT DESCRIPTION: 80 y.o. F followed by Dr. Kendrick FriesMcQuaid for OSA and Pulmonary HTN presented with confusion, weakness, chills, diarrhea.  In ED found to be hypotensive with elevated lactate, UA + for UTI.  Level 1 Sepsis called, PCCM asked to admit.  SIGNIFICANT EVENTS / STUDIES:  5/7 - admit.  LINES / TUBES: PIV  CULTURES: Blood 5/7 >>> Urine 5/7 >>> Sputum 5/7 >>>  ANTIBIOTICS: Ceftriaxone 5/ 7 >>>5/7 Vanc 5/7 >> Zosyn 5/7 >>  HISTORY OF PRESENT ILLNESS:  Monica Blankenship is a 80 y.o. F with PMH as outlined below, who presented to the ED via EMS on 5/7 with report of weakness, confusion, diarrhea and chills.  Pt reports that she has not been feeling well for the past few days, mainly experiencing weakness/decreased energy.  Daughters at bedside confirm that pt has not been her usual self for the past week or so.  Pt states that on day prior to admit, she began experiencing chills which was new.  She also was having new onset diarrhea that started that evening.  Pt went to bed and while sleeping, had an episode of fecal incontinence while asleep.  Daughter and brother in law helped her up and into bathroom, noting that her legs were very weak and "jelly like".  While in bathroom, pt had another episode of diarrhea.  Pt had not been having any diarrhea prior to last night.  No recent antibiotics, no new foods.  Daughter notes that diarrhea was black in color; however, pt does take Iron at home and pt reports that stool is occasionally dark and that she first noticed this about 4 months ago.  Denies ever having any BRBPR.  Denies abdominal pain, N/V. Denies chest pain, SOB, wheezing, cough, fever, dysuria.  Does report that she wears O2 at night for OSA (no CPAP).  Per  chart review, pt was supposed to have a sleep study, however, she and her daughters decided that they did not want to have this done.  She has seen Dr. Shelle Ironlance in the office for OSA and pulmonary HTN, and recently began seeing Dr. Kendrick FriesMcQuaid.   In ED, pt hypotensive (low 80's systolic), lactate elevated at 6.  Rreceived 1L NS bolus with good response, SBP in 90's during my exam.   Staff MD note at 7.30aM: walked in to see her and she was having sudden sinus bradycardia to HR 30 but "normotensive" but also has acute altered mental status. Given insulin/dextrose and CaCl2 for High K with improved HR to 60 and return of mental status. Patient is s/p 2L fluid at this point in time since admission    PAST MEDICAL HISTORY :  Past Medical History  Diagnosis Date  . Allergy   . Anxiety   . Insomnia   . Osteoarthritis   . Anemia   . Fatigue   . Obesity   . Diastolic dysfunction   . CKD (chronic kidney disease)   . Mitral stenosis     with moderate MR on echo 4/14  . Aortic stenosis     mild by echo 4/14  . Aortic regurgitation     mild to moderate by echo 4/14  . HTN (hypertension)   . Chronic diastolic CHF (congestive heart failure)  Past Surgical History  Procedure Laterality Date  . Eye surgery    . Abdominal hysterectomy    . Appendectomy    . Cataract extraction, bilateral     Prior to Admission medications   Medication Sig Start Date End Date Taking? Authorizing Provider  ALPRAZolam (XANAX) 0.25 MG tablet Take 0.25 mg by mouth 2 (two) times daily as needed for anxiety.    Yes Historical Provider, MD  aspirin 81 MG chewable tablet Chew 81 mg by mouth daily. Take 1-2 hrs between takign aspirin and methotrexate   Yes Historical Provider, MD  calcium gluconate 500 MG tablet Take 500 mg by mouth 2 (two) times daily.   Yes Historical Provider, MD  cholecalciferol (VITAMIN D) 1000 UNITS tablet Take 1,000 Units by mouth daily.   Yes Historical Provider, MD  doxazosin (CARDURA) 8 MG  tablet Take 8 mg by mouth at bedtime.   Yes Historical Provider, MD  ferrous sulfate 325 (65 FE) MG tablet Take 325 mg by mouth daily with breakfast.   Yes Historical Provider, MD  folic acid (FOLVITE) 1 MG tablet Take 1 mg by mouth daily.   Yes Historical Provider, MD  furosemide (LASIX) 40 MG tablet Take 20 mg by mouth daily.   Yes Historical Provider, MD  loratadine (CLARITIN) 10 MG tablet Take 10 mg by mouth daily as needed for allergies.   Yes Historical Provider, MD  methotrexate (RHEUMATREX) 2.5 MG tablet Take 20 mg by mouth once a week. Caution:Chemotherapy. Protect from light. Take 8 tablets on Saturday.   Yes Historical Provider, MD  metoprolol tartrate (LOPRESSOR) 25 MG tablet Take 25 mg by mouth 2 (two) times daily. Take 1-2 hrs between taking Metoprolol and Verapamil 10/21/13  Yes Quintella Reichert, MD  Polyethyl Glycol-Propyl Glycol (SYSTANE) 0.4-0.3 % SOLN Apply 1 drop to eye as needed (for dry eyes.).    Yes Historical Provider, MD  potassium chloride SA (K-DUR,KLOR-CON) 20 MEQ tablet Take 20 mEq by mouth 2 (two) times daily.   Yes Historical Provider, MD  predniSONE (DELTASONE) 5 MG tablet Take 7.5 mg by mouth daily with breakfast.   Yes Historical Provider, MD  traMADol (ULTRAM) 50 MG tablet Take 50 mg by mouth 3 (three) times daily as needed for moderate pain.    Yes Historical Provider, MD  VENTOLIN HFA 108 (90 BASE) MCG/ACT inhaler Inhale 2 puffs into the lungs every 6 (six) hours as needed for wheezing or shortness of breath.  10/17/13  Yes Historical Provider, MD  verapamil (COVERA HS) 240 MG (CO) 24 hr tablet Take 240 mg by mouth at bedtime. Take 1-2 hrs between taking Metoprolol and Verapamil.   Yes Historical Provider, MD   No Known Allergies  FAMILY HISTORY:  Family History  Problem Relation Age of Onset  . Hypertension Mother   . CVA Mother    SOCIAL HISTORY:  reports that she has quit smoking. She has never used smokeless tobacco. She reports that she does not drink  alcohol or use illicit drugs.  REVIEW OF SYSTEMS:   Gen: Denies fever, weight change, night sweats.  + chills, fatigue, weakness. HEENT: Denies blurred vision, double vision, hearing loss, tinnitus, sinus congestion, rhinorrhea, sore throat, neck stiffness, dysphagia. CV: Denies chest pain, edema, orthopnea, paroxysmal nocturnal dyspnea, palpitations. PULM: Denies shortness of breath, cough, sputum production, hemoptysis, wheezing. GI: Denies abdominal pain, nausea, vomiting, hematochezia, melena, constipation, change in bowel habits.  + diarrhea. GU: Denies dysuria, hematuria, polyuria, oliguria, urethral discharge. MSK:  Denies deformity,  edema, myalgias. + pains (hx of rheumatoid arthritis). Endocrine: Denies hot or cold intolerance, polyuria, polyphagia or appetite change. Heme: Denies easy bruising, bleeding, bleeding gums. Neuro: Denies headache, numbness, weakness, slurred speech, loss of memory or consciousness. Derm: Denies rash, dry skin, scaling or peeling skin change.   SUBJECTIVE:  Pt reports that she feels a little weak still though improved from earlier in the night.  Not having chills anymore.  Is begging for some water to drink, states that "I'd feel so much better if I could have some water".  Denies chest pain, SOB, N/V, abdominal pain.  VITAL SIGNS: Temp:  [102.7 F (39.3 C)] 102.7 F (39.3 C) (05/07 0224) Pulse Rate:  [57-73] 57 (05/07 0430) Resp:  [11-25] 13 (05/07 0430) BP: (80-119)/(33-72) 84/35 mmHg (05/07 0430) SpO2:  [96 %-100 %] 100 % (05/07 0430) Weight:  [80.74 kg (178 lb)] 80.74 kg (178 lb) (05/07 0224) HEMODYNAMICS:   VENTILATOR SETTINGS:   INTAKE / OUTPUT: Intake/Output     05/06 0701 - 05/07 0700   I.V. (mL/kg) 1000 (12.4)   Total Intake(mL/kg) 1000 (12.4)   Net +1000         PHYSICAL EXAMINATION: General: Pleasant elderly female, resting in stretcher, in NAD, asking for water to drink. Neuro: A&O x 3 though occasionally gets off topic during  conversation, non-focal.  HEENT: [place]/AT. PERRL, sclerae anicteric. Cardiovascular: RRR, no M/R/G.  Lungs: Respirations even and unlabored.  CTA bilaterally, No W/R/R.  Abdomen: BS x 4, soft, NT/ND.  Musculoskeletal: No gross deformities, no edema.  Skin: Intact, warm, no rashes.    LABS: - reviewed and updated by STaff MD at 7:50am 04/08/2014  PULMONARY No results found for this basename: PHART, PCO2, PCO2ART, PO2, PO2ART, HCO3, TCO2, O2SAT,  in the last 168 hours  CBC  Recent Labs Lab 04/08/14 0225 04/08/14 0700  HGB 11.1* 11.3*  HCT 35.2* 34.6*  WBC 15.3* 13.1*  PLT 154 134*    COAGULATION No results found for this basename: INR,  in the last 168 hours  CARDIAC  No results found for this basename: TROPONINI,  in the last 168 hours No results found for this basename: PROBNP,  in the last 168 hours   CHEMISTRY  Recent Labs Lab 04/08/14 0225 04/08/14 0700  NA 130* 132*  K 5.8* 6.3*  CL 93* 98  CO2 22 20  GLUCOSE 169* 120*  BUN 17 18  CREATININE 1.49* 1.61*  CALCIUM 9.7 9.1  MG  --  2.0  PHOS  --  2.8   Estimated Creatinine Clearance: 27.4 ml/min (by C-G formula based on Cr of 1.61).   LIVER  Recent Labs Lab 04/08/14 0225  AST 36  ALT 28  ALKPHOS 62  BILITOT 0.6  PROT 7.2  ALBUMIN 3.2*     INFECTIOUS  Recent Labs Lab 04/08/14 0258  LATICACIDVEN 6.83*     ENDOCRINE CBG (last 3)  No results found for this basename: GLUCAP,  in the last 72 hours       IMAGING x48h  Dg Chest Port 1 View  04/08/2014   CLINICAL DATA:  Fever and shortness of breath  EXAM: PORTABLE CHEST - 1 VIEW  COMPARISON:  02/07/2014  FINDINGS: Chronic cardiopericardial enlargement. Diffuse and symmetric interstitial coarsening which is above baseline when compared to previous. No effusion or pneumothorax. No evidence of consolidation.  IMPRESSION: 1. No definitive pneumonia. 2. Suspect mild CHF.   Electronically Signed   By: Tiburcio Pea M.D.   On: 04/08/2014  03:48         ASSESSMENT / PLAN:  INFECTIOUS A:   Severe Sepsis suspecterd - initial lactate 6.8. Normotensive after 2L fluids,. Unclear source but likely UTI. Hx of admission < 6 months UTI P:   - Ceftriaxone per pharm.; change to Vanc and Zosyn (staff MD) - Pan culture, narrow abx as cultures result. - Trend lactate for clearance.; repeat again 8am - Stress steroids. - Monitor fever curve  / WBC's.  PULMONARY A: Hx of PAH Hx of OSA Hx of Nocturnal Hypoxemia    - Acute encephalopathy at 8am staff MD rounds related to high K, almost needing intubation but improved after high K correction (staff MD)  P:   - Supplemental O2 if needed to maintain SpO2 > 93%. - DuoNebs PRN. - CXR Stat (staff MD)  - ABG stat (staff MD)  - low threshold to intubate (staff MD)  CARDIOVASCULAR A:  Hx of dCHF - diastolic CHF with EF 55% end April 2015 Occult septic shock -responded to fluids Hx of HTN Elevated Lactate   - staff MD: suspect dehydrated given c./o thirst and high lactate. Bradycardia likely due to high K (known hx of LOW K followed by Dr Mayford Knife per RN and given KCL). Also, life threatening bradycardia due to high K, responded to insulin and CaCl2  P:  - Change to aggressive  IVF resuscitation (staff MD note); give 3rd and 4th liter bolus and then 100cc/h - May require vasoactive medications. - Stress steroids. - Hold home meds for now.  RENAL A:   AoC CKD Hyponatremia Hyperkalemia   - 7.30am: had life threatening hyperkalemia episode. REsponded to Rx (staff md) P:   - Aggressive fluid resus - Rx kayexalate - start bicarb gtt (staff MD) - BMP STAT - monitor  GASTROINTESTINAL A:   No acute issues.  P:   - change to NPO due to potential intubation risk (staff MD) - No role SUP at the moment  HEMATOLOGIC A:   Chronic Anemia P:  - Transfuse for Hgb < 7. - CBC in AM. - VTE proph: Heparin SQ.  ENDOCRINE A:  Concern for relative AI - on prednisone daily at  home. P:   - Stress steroids.  NEUROLOGIC A:  No acute issues initially   - staff MD: acute enceph related to bradycardia. Improved after Rx of high K  P:   - Monitor.   GLOBAL Full code. Daughter [name] [phone] updated in detail  Rutherford Guys, PA - [facility] Pgr: [phone]  or [phone]  STAFF NOTE: I, Dr Lavinia Sharps have personally reviewed patient's available data, including medical history, events of note, physical examination and test results as part of my evaluation. I have discussed with resident/NP and other care providers such as pharmacist, RN and RRT.  In addition,  I personally evaluated patient and elicited key findings of - see staff MD comments above. Has had hx of hypokalemia recently Rx with KCL replacement. Currently likely occult septic shock with AKI and life threatening hyperkalemia. Move to ICU. Repeat labs. Full code. Monitor closely. Aggressive IVF. Check PCT. Broaden abx   Rest per NP/medical resident whose note is outlined above and that I agree with  The patient is critically ill with multiple organ systems failure and requires high complexity decision making for assessment and support, frequent evaluation and titration of therapies, application of advanced monitoring technologies and extensive interpretation of multiple  databases.   Critical Care Time devoted to patient care services described in this note is  60  Minutes.  Dr. Kalman Shan, M.D., [facility]C.P Pulmonary and Critical Care Medicine Staff Physician [facility] [place] [facility] Pager: [phone], If no answer or between  15:00h - 7:00h: call [phone]  04/08/2014 8:18 AM

## 2014-04-08 NOTE — Telephone Encounter (Signed)
Will make BQ aware.

## 2014-04-08 NOTE — Progress Notes (Signed)
ANTIBIOTIC CONSULT NOTE - INITIAL  Pharmacy Consult for Vancomycin / Zosyn Indication: rule out sepsis  No Known Allergies  Patient Measurements: Height: 5\' 2"  (157.5 cm) Weight: 190 lb 0.6 oz (86.2 kg) IBW/kg (Calculated) : 50.1   Vital Signs: Temp: 99.9 F (37.7 C) (05/07 0558) Temp src: Oral (05/07 0744) BP: 111/44 mmHg (05/07 0744) Pulse Rate: 48 (05/07 0744) Intake/Output from previous day: 05/06 0701 - 05/07 0700 In: 2050 [I.V.:2050] Out: -  Intake/Output from this shift:    Labs:  Recent Labs  04/08/14 0225 04/08/14 0700 04/08/14 0805  WBC 15.3* 13.1* 14.0*  HGB 11.1* 11.3* 10.0*  PLT 154 134* 121*  CREATININE 1.49* 1.61*  --    Estimated Creatinine Clearance: 27.4 ml/min (by C-G formula based on Cr of 1.61). No results found for this basename: VANCOTROUGH, 06/08/14, VANCORANDOM, GENTTROUGH, GENTPEAK, GENTRANDOM, TOBRATROUGH, TOBRAPEAK, TOBRARND, AMIKACINPEAK, AMIKACINTROU, AMIKACIN,  in the last 72 hours   Microbiology: Recent Results (from the past 720 hour(s))  MRSA PCR SCREENING     Status: None   Collection Time    04/08/14  7:00 AM      Result Value Ref Range Status   MRSA by PCR NEGATIVE  NEGATIVE Final   Comment:            The GeneXpert MRSA Assay (FDA     approved for NASAL specimens     only), is one component of a     comprehensive MRSA colonization     surveillance program. It is not     intended to diagnose MRSA     infection nor to guide or     monitor treatment for     MRSA infections.    Medical History: Past Medical History  Diagnosis Date  . Allergy   . Anxiety   . Insomnia   . Osteoarthritis   . Anemia   . Fatigue   . Obesity   . Diastolic dysfunction   . CKD (chronic kidney disease)   . Mitral stenosis     with moderate MR on echo 4/14  . Aortic stenosis     mild by echo 4/14  . Aortic regurgitation     mild to moderate by echo 4/14  . HTN (hypertension)   . Chronic diastolic CHF (congestive heart failure)       Assessment: 84yof admitted with confusion, weakness, chills, diarrhea and hypotension.  Tm 102.7, WBC 15, cluudy UA with leukocytes Cxr no pna.  Will begin broad spectrum ABX.  Goal of Therapy:  Vancomycin trough level 15-20 mcg/ml  Plan:  Zosyn 3.375gm IV q8 EI  Vancomycin 1gm IV q24hr  5/14 Pharm.D. CPP, BCPS Clinical Pharmacist [phone] 04/08/2014 8:53 AM

## 2014-04-08 NOTE — ED Provider Notes (Signed)
CSN: [id number]     Arrival date & time 04/08/14  0210 History   First MD Initiated Contact with Patient 04/08/14 [phone]     Chief Complaint  Patient presents with  . Shortness of Breath     (Consider location/radiation/quality/duration/timing/severity/associated sxs/prior Treatment) HPI 80 year old female presents to emergency department from home via EMS with report of weakness, shortness of breath.  Patient is a poor historian.  She reports she's had chills throughout the day.  She reports her family put her in the tub tonight, and she had more chills.  Daughter who lives with her gives further history.  She reports that her mother has had decreased health over the last week, nothing specific, but has been weak and not as interactive as she normally is.  Os daughter's report he should has been using topical antibiotic possibly in her bulbar area due to irritation.  Tonight, patient had fecal incontinence in the bed, large amount of diarrhea.  Patient's daughter and brother-in-law were able to get her up out of the bed after some time and she had a second bowel movement in the bathroom.  Patient then sat in the bathtub.  Patient did not lose consciousness.  She had overall generalized weakness.  Daughter describes her legs as jelly.  No recent antibiotics.  No cough, no complaint of chest pain or abdominal pain.  No nausea or vomiting.  Patient had pneumonia in December, has recently been well.  She has history of chronic kidney disease, CHF with mitral and aortic stenosis, and COPD/dyspnea at night.  Patient does not want to have a sleep study, and so uses oxygen when necessary. Past Medical History  Diagnosis Date  . Allergy   . Anxiety   . Insomnia   . Osteoarthritis   . Anemia   . Fatigue   . Obesity   . Diastolic dysfunction   . CKD (chronic kidney disease)   . Mitral stenosis     with moderate MR on echo 4/14  . Aortic stenosis     mild by echo 4/14  . Aortic regurgitation     mild  to moderate by echo 4/14  . HTN (hypertension)   . Chronic diastolic CHF (congestive heart failure)    Past Surgical History  Procedure Laterality Date  . Eye surgery    . Abdominal hysterectomy    . Appendectomy    . Cataract extraction, bilateral     Family History  Problem Relation Age of Onset  . Hypertension Mother   . CVA Mother    History  Substance Use Topics  . Smoking status: Former Smoker -- 0.25 packs/day for 1 years  . Smokeless tobacco: Never Used     Comment: smoked 1-3 cigs/day in school for about a year some days only  . Alcohol Use: No   OB History   Grav Para Term Preterm Abortions TAB SAB Ect Mult Living                 Review of Systems  Unable to perform ROS: Other   patient confused    Allergies  Review of patient's allergies indicates no known allergies.  Home Medications   Prior to Admission medications   Medication Sig Start Date End Date Taking? Authorizing Provider  ALPRAZolam (XANAX) 0.25 MG tablet Take 0.25 mg by mouth 2 (two) times daily as needed for anxiety.    Yes Historical Provider, MD  aspirin 81 MG chewable tablet Chew 81 mg by mouth daily.  Take 1-2 hrs between takign aspirin and methotrexate   Yes Historical Provider, MD  calcium gluconate 500 MG tablet Take 500 mg by mouth 2 (two) times daily.   Yes Historical Provider, MD  cholecalciferol (VITAMIN D) 1000 UNITS tablet Take 1,000 Units by mouth daily.   Yes Historical Provider, MD  doxazosin (CARDURA) 8 MG tablet Take 8 mg by mouth at bedtime.   Yes Historical Provider, MD  ferrous sulfate 325 (65 FE) MG tablet Take 325 mg by mouth daily with breakfast.   Yes Historical Provider, MD  folic acid (FOLVITE) 1 MG tablet Take 1 mg by mouth daily.   Yes Historical Provider, MD  furosemide (LASIX) 40 MG tablet Take 20 mg by mouth daily.   Yes Historical Provider, MD  loratadine (CLARITIN) 10 MG tablet Take 10 mg by mouth daily as needed for allergies.   Yes Historical Provider, MD   methotrexate (RHEUMATREX) 2.5 MG tablet Take 20 mg by mouth once a week. Caution:Chemotherapy. Protect from light. Take 8 tablets on Saturday.   Yes Historical Provider, MD  metoprolol tartrate (LOPRESSOR) 25 MG tablet Take 25 mg by mouth 2 (two) times daily. Take 1-2 hrs between taking Metoprolol and Verapamil 10/21/13  Yes Quintella Reichert, MD  Polyethyl Glycol-Propyl Glycol (SYSTANE) 0.4-0.3 % SOLN Apply 1 drop to eye as needed (for dry eyes.).    Yes Historical Provider, MD  potassium chloride SA (K-DUR,KLOR-CON) 20 MEQ tablet Take 20 mEq by mouth 2 (two) times daily.   Yes Historical Provider, MD  predniSONE (DELTASONE) 5 MG tablet Take 7.5 mg by mouth daily with breakfast.   Yes Historical Provider, MD  traMADol (ULTRAM) 50 MG tablet Take 50 mg by mouth 3 (three) times daily as needed for moderate pain.    Yes Historical Provider, MD  VENTOLIN HFA 108 (90 BASE) MCG/ACT inhaler Inhale 2 puffs into the lungs every 6 (six) hours as needed for wheezing or shortness of breath.  10/17/13  Yes Historical Provider, MD  verapamil (COVERA HS) 240 MG (CO) 24 hr tablet Take 240 mg by mouth at bedtime. Take 1-2 hrs between taking Metoprolol and Verapamil.   Yes Historical Provider, MD   BP 111/72  Pulse 68  Temp(Src) 102.7 F (39.3 C) (Oral)  Ht 5\' 2"  (1.575 m)  Wt 178 lb (80.74 kg)  BMI 32.55 kg/m2  SpO2 98% Physical Exam  Nursing note and vitals reviewed. Constitutional: She appears well-developed and well-nourished. No distress.  Frail elderly female, ill-appearing  HENT:  Head: Normocephalic and atraumatic.  Nose: Nose normal.  Mouth/Throat: Oropharynx is clear and moist.  Eyes: Conjunctivae and EOM are normal. Pupils are equal, round, and reactive to light.  Neck: Normal range of motion. No JVD present. No tracheal deviation present. No thyromegaly present.  Cardiovascular: Normal heart sounds and intact distal pulses.  Exam reveals no gallop and no friction rub.   No murmur  heard. Irregular  Pulmonary/Chest: Effort normal and breath sounds normal. No stridor. No respiratory distress. She has no wheezes. She has no rales. She exhibits no tenderness.  Abdominal: Soft. She exhibits no distension and no mass. There is no tenderness. There is no rebound and no guarding.  Musculoskeletal: She exhibits no edema and no tenderness.  Lymphadenopathy:    She has no cervical adenopathy.  Neurological: She is alert. She exhibits normal muscle tone. Coordination normal.  Skin: Skin is warm and dry. No rash noted. No erythema. No pallor.    ED Course  Procedures (  including critical care time)]  CRITICAL CARE Performed by: Olivia Mackie Total critical care time: 30 min Critical care time was exclusive of separately billable procedures and treating other patients. Critical care was necessary to treat or prevent imminent or life-threatening deterioration. Critical care was time spent personally by me on the following activities: development of treatment plan with patient and/or surrogate as well as nursing, discussions with consultants, evaluation of patient's response to treatment, examination of patient, obtaining history from patient or surrogate, ordering and performing treatments and interventions, ordering and review of laboratory studies, ordering and review of radiographic studies, pulse oximetry and re-evaluation of patient's condition. Labs Review Labs Reviewed  CBC WITH DIFFERENTIAL - Abnormal; Notable for the following:    WBC 15.3 (*)    RBC 3.50 (*)    Hemoglobin 11.1 (*)    HCT 35.2 (*)    MCV 100.6 (*)    RDW 17.7 (*)    Neutrophils Relative % 82 (*)    Lymphocytes Relative 10 (*)    Neutro Abs 12.5 (*)    Monocytes Absolute 1.1 (*)    All other components within normal limits  COMPREHENSIVE METABOLIC PANEL - Abnormal; Notable for the following:    Sodium 130 (*)    Potassium 5.8 (*)    Chloride 93 (*)    Glucose, Bld 169 (*)    Creatinine, Ser 1.49  (*)    Albumin 3.2 (*)    GFR calc non Af Amer 32 (*)    GFR calc Af Amer 37 (*)    All other components within normal limits  URINALYSIS, ROUTINE W REFLEX MICROSCOPIC - Abnormal; Notable for the following:    APPearance CLOUDY (*)    Ketones, ur 15 (*)    Protein, ur 30 (*)    Leukocytes, UA SMALL (*)    All other components within normal limits  URINE MICROSCOPIC-ADD ON - Abnormal; Notable for the following:    Bacteria, UA MANY (*)    Casts HYALINE CASTS (*)    All other components within normal limits  I-STAT CG4 LACTIC ACID, ED - Abnormal; Notable for the following:    Lactic Acid, Venous 6.83 (*)    All other components within normal limits  POC OCCULT BLOOD, ED - Abnormal; Notable for the following:    Fecal Occult Bld POSITIVE (*)    All other components within normal limits  CULTURE, BLOOD (ROUTINE X 2)  CULTURE, BLOOD (ROUTINE X 2)  URINE CULTURE  OCCULT BLOOD X 1 CARD TO LAB, STOOL  SAMPLE TO BLOOD BANK    Imaging Review Dg Chest Port 1 View  04/08/2014   CLINICAL DATA:  Fever and shortness of breath  EXAM: PORTABLE CHEST - 1 VIEW  COMPARISON:  02/07/2014  FINDINGS: Chronic cardiopericardial enlargement. Diffuse and symmetric interstitial coarsening which is above baseline when compared to previous. No effusion or pneumothorax. No evidence of consolidation.  IMPRESSION: 1. No definitive pneumonia. 2. Suspect mild CHF.   Electronically Signed   By: Tiburcio Pea M.D.   On: 04/08/2014 03:48     EKG Interpretation   Date/Time:  Thursday Apr 08 2014 02:22:14 EDT Ventricular Rate:  74 PR Interval:    QRS Duration: 125 QT Interval:  387 QTC Calculation: 429 R Axis:   -44 Text Interpretation:  Atrial fibrillation Left bundle branch block No  significant change since last tracing Confirmed by Arvella Massingale  MD, Sydni Elizarraraz ([id number])  on 04/08/2014 2:24:13 AM  MDM   Final diagnoses:  Sepsis  Urinary tract infection    80 year old female with fever here, elevated lactate.   Code sepsis initiated.  Initial blood pressures were stable, but it drifted downward.  Given history of CHF, rehydrating but cautiously.  Discussed case with critical care.  Patient is followed by Dr. Kendrick Fries for her pulmonary issues.  Source appears to be urine.  Rocephin started.  Family updated on findings.    Olivia Mackie, MD 04/08/14 [phone]

## 2014-04-08 NOTE — Progress Notes (Signed)
Positive blood culture results called to [facility] RN. She stated she will report to MD.  Gwynneth Macleod RN

## 2014-04-09 ENCOUNTER — Ambulatory Visit: Payer: Medicare PPO | Admitting: Cardiology

## 2014-04-09 ENCOUNTER — Inpatient Hospital Stay (HOSPITAL_COMMUNITY): Payer: Medicare PPO

## 2014-04-09 ENCOUNTER — Telehealth: Payer: Self-pay | Admitting: Pulmonary Disease

## 2014-04-09 DIAGNOSIS — A419 Sepsis, unspecified organism: Secondary | ICD-10-CM | POA: Diagnosis not present

## 2014-04-09 DIAGNOSIS — L03317 Cellulitis of buttock: Secondary | ICD-10-CM

## 2014-04-09 DIAGNOSIS — T7840XA Allergy, unspecified, initial encounter: Secondary | ICD-10-CM

## 2014-04-09 DIAGNOSIS — A412 Sepsis due to unspecified staphylococcus: Secondary | ICD-10-CM | POA: Diagnosis not present

## 2014-04-09 DIAGNOSIS — L0231 Cutaneous abscess of buttock: Secondary | ICD-10-CM

## 2014-04-09 DIAGNOSIS — I059 Rheumatic mitral valve disease, unspecified: Secondary | ICD-10-CM

## 2014-04-09 LAB — CBC WITH DIFFERENTIAL/PLATELET
BASOS ABS: 0 10*3/uL (ref 0.0–0.1)
Basophils Relative: 0 % (ref 0–1)
EOS PCT: 0 % (ref 0–5)
Eosinophils Absolute: 0 10*3/uL (ref 0.0–0.7)
HEMATOCRIT: 33.1 % — AB (ref 36.0–46.0)
Hemoglobin: 11 g/dL — ABNORMAL LOW (ref 12.0–15.0)
LYMPHS ABS: 0.7 10*3/uL (ref 0.7–4.0)
LYMPHS PCT: 5 % — AB (ref 12–46)
MCH: 31.9 pg (ref 26.0–34.0)
MCHC: 33.2 g/dL (ref 30.0–36.0)
MCV: 95.9 fL (ref 78.0–100.0)
MONO ABS: 0.8 10*3/uL (ref 0.1–1.0)
MONOS PCT: 6 % (ref 3–12)
Neutro Abs: 13.3 10*3/uL — ABNORMAL HIGH (ref 1.7–7.7)
Neutrophils Relative %: 89 % — ABNORMAL HIGH (ref 43–77)
Platelets: 146 10*3/uL — ABNORMAL LOW (ref 150–400)
RBC: 3.45 MIL/uL — ABNORMAL LOW (ref 3.87–5.11)
RDW: 17.6 % — AB (ref 11.5–15.5)
WBC: 14.8 10*3/uL — AB (ref 4.0–10.5)

## 2014-04-09 LAB — BASIC METABOLIC PANEL
BUN: 13 mg/dL (ref 6–23)
CHLORIDE: 102 meq/L (ref 96–112)
CO2: 24 mEq/L (ref 19–32)
Calcium: 9.4 mg/dL (ref 8.4–10.5)
Creatinine, Ser: 0.92 mg/dL (ref 0.50–1.10)
GFR calc Af Amer: 65 mL/min — ABNORMAL LOW (ref >90)
GFR calc non Af Amer: 56 mL/min — ABNORMAL LOW (ref >90)
GLUCOSE: 157 mg/dL — AB (ref 70–99)
POTASSIUM: 3.5 meq/L — AB (ref 3.7–5.3)
Sodium: 141 mEq/L (ref 137–147)

## 2014-04-09 LAB — PHOSPHORUS: Phosphorus: 3.9 mg/dL (ref 2.3–4.6)

## 2014-04-09 LAB — PRO B NATRIURETIC PEPTIDE: Pro B Natriuretic peptide (BNP): 16685 pg/mL — ABNORMAL HIGH (ref 0–450)

## 2014-04-09 LAB — PROCALCITONIN: Procalcitonin: 0.99 ng/mL

## 2014-04-09 LAB — MAGNESIUM: Magnesium: 2.1 mg/dL (ref 1.5–2.5)

## 2014-04-09 MED ORDER — VERAPAMIL HCL ER 240 MG PO TBCR
240.0000 mg | EXTENDED_RELEASE_TABLET | Freq: Every day | ORAL | Status: DC
  Administered 2014-04-09 – 2014-04-13 (×5): 240 mg via ORAL
  Filled 2014-04-09 (×6): qty 1

## 2014-04-09 MED ORDER — VERAPAMIL HCL 240 MG (CO) PO TB24: 240.0000 mg | ORAL_TABLET | Freq: Every day | ORAL | Status: DC

## 2014-04-09 MED ORDER — COLLAGENASE 250 UNIT/GM EX OINT
TOPICAL_OINTMENT | Freq: Every day | CUTANEOUS | Status: DC
  Administered 2014-04-09: 1 via TOPICAL
  Administered 2014-04-11 – 2014-04-12 (×2): via TOPICAL
  Filled 2014-04-09 (×3): qty 30

## 2014-04-09 MED ORDER — DOXAZOSIN MESYLATE 8 MG PO TABS
8.0000 mg | ORAL_TABLET | Freq: Every day | ORAL | Status: DC
  Administered 2014-04-09 – 2014-04-13 (×5): 8 mg via ORAL
  Filled 2014-04-09 (×7): qty 1

## 2014-04-09 MED ORDER — METOPROLOL TARTRATE 25 MG PO TABS
25.0000 mg | ORAL_TABLET | Freq: Two times a day (BID) | ORAL | Status: DC
  Administered 2014-04-09 – 2014-04-14 (×12): 25 mg via ORAL
  Filled 2014-04-09 (×13): qty 1

## 2014-04-09 MED ORDER — PREDNISONE 5 MG PO TABS
7.5000 mg | ORAL_TABLET | Freq: Every day | ORAL | Status: DC
  Administered 2014-04-10 – 2014-04-14 (×5): 7.5 mg via ORAL
  Filled 2014-04-09 (×6): qty 1

## 2014-04-09 NOTE — Telephone Encounter (Signed)
Noted by triage Pulmonary has already rounded on pt Primary diagnosis is Sepsis  Called spoke with patient's daughter, let her know that message will be forwarded to BQ as FYI. Nothing further needed; will sign off.

## 2014-04-09 NOTE — Procedures (Signed)
Incision and Drainage Procedure Note  Pre-operative Diagnosis: sacral decubitus ulcer  Post-operative Diagnosis: gluteal abscess  Indications: leukocytosis, infection  Anesthesia: lidocaine with 2% epinephrine   Procedure Details  The procedure, risks and complications have been discussed in detail (including, but not limited to airway compromise, infection, bleeding) with the patient, and the patient has signed consent to the procedure.  The skin was sterilely prepped and draped over the affected area in the usual fashion. After adequate local anesthesia, I&D with a #11 blade was performed on the right buttock.  An area 2x3cm was opened.  An additional 1x2cm area of fluctuance was noted superior to this.  Purulent drainage was again noted and a culture was obtained. The two abscesses did not seem to communicate.  The patient tolerated the procedure.  Hemostasis was achieved with manual pressure.  The patient was observed until stable.  Findings: Infected gluteal abscess  EBL: <10cc's  Drains: none  Condition: Tolerated procedure well  Complications: none.  Emina Riebock, ANP-BC  Agree with above.  Ovidio Kin, MD, [facility] Pager: [phone] Office phone:  [phone]

## 2014-04-09 NOTE — H&P (Addendum)
PULMONARY / CRITICAL CARE MEDICINE   Name: Monica Blankenship MRN: [id number] DOB: [birth year]    ADMISSION DATE:  04/08/2014 CONSULTATION DATE:  04/08/14  REFERRING MD :  EDP PRIMARY SERVICE: PCCM  CHIEF COMPLAINT:  Sepsis  BRIEF PATIENT DESCRIPTION: 80 y.o. F followed by Dr. Kendrick Fries for OSA and Pulmonary HTN presented with confusion, weakness, chills, diarrhea.  In ED found to be hypotensive with elevated lactate, UA + for UTI.  Level 1 Sepsis called, PCCM asked to admit.  SIGNIFICANT EVENTS / STUDIES:  5/7 - admit, decub noted 5/7 ct >>>Marked edema, inflammation and subcutaneous soft tissue thickening<BR>overlying the sacrum without discrete abscess or osteomyelitis. MRI<BR>would be more sensitive for further evaluation if the patient is not<BR>improve or worsens.<BR> <BR>Moderate distention of the bladder.<BR> <BR>Slightly dilated fluid feel small bowel and colon. This could<BR>represent enterocolitis. No findings for obstruction.<BR> <BR>Advanced atherosclerotic calcifications involving the aorta and<BR>branch vessels.<BR> <BR>Moderate-sized hiatal hernia  LINES / TUBES: PIV  CULTURES: Blood 5/7 >>>GPC clusters x 2>>> Urine 5/7 >>>staph>>> Sputum 5/7 >>>  ANTIBIOTICS: Ceftriaxone 5/ 7 >>>5/7 Vanc 5/7 >> Zosyn 5/7 >>  SUBJECTIVE:  No pressors, no vent, HTN, pos BC  VITAL SIGNS: Temp:  [98.1 F (36.7 C)-99.2 F (37.3 C)] 98.1 F (36.7 C) (05/08 0726) Pulse Rate:  [65-117] 85 (05/08 0900) Resp:  [11-31] 11 (05/08 0900) BP: (124-194)/(48-141) 189/70 mmHg (05/08 0900) SpO2:  [94 %-100 %] 100 % (05/08 1020) Weight:  [82.9 kg (182 lb 12.2 oz)] 82.9 kg (182 lb 12.2 oz) (05/08 0500) HEMODYNAMICS:   VENTILATOR SETTINGS:   INTAKE / OUTPUT: Intake/Output     05/07 0701 - 05/08 0700 05/08 0701 - 05/09 0700   I.V. (mL/kg) 570 (6.9)    IV Piggyback 1100    Total Intake(mL/kg) 1670 (20.1)    Net +1670          Urine Occurrence 9 x    Stool Occurrence 8 x      PHYSICAL  EXAMINATION: General: Pleasant elderly female, calm and wonderful person Neuro: A&O x 3  HEENT: [place]/AT. PERRL, sclerae anicteric. Cardiovascular: RRR, no M/R/G.  Lungs:no distress, CTA Abdomen: BS x 4, soft, NT/ND.  Musculoskeletal: No gross deformities, no edema.  Skin: Intact, warm, no rashes.    LABS: - reviewed and updated by STaff MD at 7:50am 04/08/2014  PULMONARY  Recent Labs Lab 04/08/14 0810 04/08/14 0906  PHART 7.399 7.379  PCO2ART 32.8* 34.3*  PO2ART 65.5* 111.0*  HCO3 19.9* 20.1  TCO2 20.9 21  O2SAT 91.0 98.0    CBC  Recent Labs Lab 04/08/14 0700 04/08/14 0805 04/09/14 0909  HGB 11.3* 10.0* 11.0*  HCT 34.6* 31.0* 33.1*  WBC 13.1* 14.0* 14.8*  PLT 134* 121* 146*    COAGULATION  Recent Labs Lab 04/08/14 0805  INR 1.44    CARDIAC    Recent Labs Lab 04/08/14 0807  TROPONINI <0.30    Recent Labs Lab 04/08/14 0807 04/09/14 0909  PROBNP 5143.0* 16685.0*     CHEMISTRY  Recent Labs Lab 04/08/14 0225 04/08/14 0700 04/08/14 0805 04/08/14 1500 04/09/14 0909  NA 130* 132* 130* 136* 141  K 5.8* 6.3* 6.4* 5.0 3.5*  CL 93* 98 96 101 102  CO2 22 20 21 21 24   GLUCOSE 169* 120* 291* 140* 157*  BUN 17 18 18 16 13   CREATININE 1.49* 1.61* 1.79* 1.37* 0.92  CALCIUM 9.7 9.1 9.9 9.5 9.4  MG  --  2.0 1.9  --  2.1  PHOS  --  2.8 3.0  --  3.9   Estimated Creatinine Clearance: 47 ml/min (by C-G formula based on Cr of 0.92).   LIVER  Recent Labs Lab 04/08/14 0225 04/08/14 0805  AST 36 96*  ALT 28 63*  ALKPHOS 62 60  BILITOT 0.6 0.8  PROT 7.2 5.7*  ALBUMIN 3.2* 2.6*  INR  --  1.44     INFECTIOUS  Recent Labs Lab 04/08/14 0258 04/08/14 0807  LATICACIDVEN 6.83*  --   PROCALCITON  --  0.87     ENDOCRINE CBG (last 3)   Recent Labs  04/08/14 0754  GLUCAP 126*         IMAGING x48h  Ct Abdomen Pelvis Wo Contrast  04/08/2014   CLINICAL DATA:  Sacral decubitus ulcer.  EXAM: CT ABDOMEN AND PELVIS WITHOUT CONTRAST   TECHNIQUE: Multidetector CT imaging of the abdomen and pelvis was performed following the standard protocol without IV contrast.  COMPARISON:  None.  FINDINGS: The lung bases are grossly clear. Moderate breathing motion artifact. The heart is enlarged. No pericardial effusion. Dense coronary artery calcifications are noted. There is a moderate-sized hiatal hernia.  The liver is grossly normal. No focal lesions or biliary dilatation. The gallbladder is grossly normal no common bile duct dilatation. The pancreas is unremarkable. The spleen is normal in size. No focal lesions. The adrenal glands kidneys are unremarkable. A small right renal artery aneurysm is suspected. There is a large lower pole left renal cyst.  The stomach, duodenum, small bowel and colon are grossly normal. There is fluid-filled small bowel and colon but no obstruction. Contrast gets all the way to the colon. Does this patient have diarrhea? No mesenteric or retroperitoneal mass or adenopathy. Advanced atherosclerotic calcifications involving the aorta and branch vessels.  The bladder is moderately distended. No pelvic mass or adenopathy. Mild cystocele.  There is marked inflammatory change and probable granulation tissue overlying the sacrum. This appears to grow right down to the bone but I do not see any obvious changes of osteomyelitis. MRI would be more sensitive if the patient does not improve or worsens. No intrapelvic abscess. The lumbar vertebral bodies are normally aligned. There is severe facet disease noted.  IMPRESSION: Marked edema, inflammation and subcutaneous soft tissue thickening overlying the sacrum without discrete abscess or osteomyelitis. MRI would be more sensitive for further evaluation if the patient is not improve or worsens.  Moderate distention of the bladder.  Slightly dilated fluid feel small bowel and colon. This could represent enterocolitis. No findings for obstruction.  Advanced atherosclerotic calcifications  involving the aorta and branch vessels.  Moderate-sized hiatal hernia.   Electronically Signed   By: Loralie Champagne M.D.   On: 04/08/2014 16:54   Dg Chest Port 1 View  04/09/2014   CLINICAL DATA:  Airspace disease  EXAM: PORTABLE CHEST - 1 VIEW  COMPARISON:  DG CHEST 1V PORT dated 04/08/2014; DG CHEST 2 VIEW dated 02/07/2014; DG CHEST 2 VIEW dated 11/17/2013  FINDINGS: Grossly unchanged enlarged cardiac silhouette and mediastinal contours. Overall improved aeration of the lungs with persistent bibasilar heterogeneous opacities. Mild pulmonary venous congestion without frank evidence of edema. No pleural effusion pneumothorax. Grossly unchanged bones.  IMPRESSION: 1. Overall improved aeration of the lungs with persistent bibasilar opacities, likely atelectasis. 2. Cardiomegaly and pulmonary venous congestion without definite evidence of edema.   Electronically Signed   By: Simonne Come M.D.   On: 04/09/2014 07:54   Dg Chest Port 1 View  04/08/2014   CLINICAL DATA:  80 year old female with acute  mental status change. Initial encounter.  EXAM: PORTABLE CHEST - 1 VIEW  COMPARISON:  0304 hr the same day and earlier.  FINDINGS: Portable AP semi upright view at 0842 hr. The patient is now rotated to the right. Stable mediastinal contours allowing for the different positioning. Mildly lower lung volumes. No pneumothorax or pleural effusion. Stable increased interstitial markings. No overt edema or consolidation.  IMPRESSION: Mildly lower lung volumes, but otherwise stable allowing for differences in patient positioning.   Electronically Signed   By: Augusto Gamble M.D.   On: 04/08/2014 08:58   Dg Chest Port 1 View  04/08/2014   CLINICAL DATA:  Fever and shortness of breath  EXAM: PORTABLE CHEST - 1 VIEW  COMPARISON:  02/07/2014  FINDINGS: Chronic cardiopericardial enlargement. Diffuse and symmetric interstitial coarsening which is above baseline when compared to previous. No effusion or pneumothorax. No evidence of  consolidation.  IMPRESSION: 1. No definitive pneumonia. 2. Suspect mild CHF.   Electronically Signed   By: Tiburcio Pea M.D.   On: 04/08/2014 03:48        ASSESSMENT / PLAN:  INFECTIOUS A:   Bacteremia, r/o urine source vs wound, r/o endocarditis  UTI P:   - maintain vanc, zosyn -no nec fasc, dc clinda -Surgery seeing wound -wound care -echo for veg -repeat BC in am  -ID  PULMONARY A: Hx of PAH Hx of OSA Hx of Nocturnal Hypoxemia  P:   - Supplemental O2 if needed to maintain SpO2 > 93%. - DuoNebs PRN. -no further ABG required -at risk edema, some int prominence -will discuss prior nocturnal needs NIMV etc -pcxr in am for edema  CARDIOVASCULAR A:  Hx of dCHF - diastolic CHF with EF 55% end April 2015 Hypovolemia improved Hx of HTN, uncontrolled now  P:  - continued volume -saline continued to 75 - Stress steroids, to reduce to pred as now htn Start home metop  RENAL A:   AoC CKD Hyponatremia Hyperkalemia reslved Was hypovolemia P:   - saline to 75 -chem in am   GASTROINTESTINAL A:   dairhea resolved, secondary to sepsis?, r/o gastroenteritis? P:   - start diet  HEMATOLOGIC A:   Chronic Anemia P:  - Transfuse for Hgb < 7. - CBC in AM. - VTE proph: Heparin SQ.  ENDOCRINE A:  Concern for relative AI - on prednisone daily at home. P:   - Stress steroids not needed now, to pred -cbg  NEUROLOGIC A:  No acute issues initially  P:   - Monitor  To sdu,. To traid   Mcarthur Rossetti. Tyson Alias, MD, FACP Pgr: [phone] [place]

## 2014-04-09 NOTE — Progress Notes (Signed)
  Echocardiogram 2D Echocardiogram has been performed.  Real Cons Monica Blankenship 04/09/2014, 4:49 PM

## 2014-04-09 NOTE — Progress Notes (Signed)
Elink MD notified of pt's B/P and status. Orders received

## 2014-04-09 NOTE — Telephone Encounter (Signed)
noted 

## 2014-04-09 NOTE — Consult Note (Addendum)
WOC wound follow up CT scan does not indicate abscess or Forneirs to buttock wound at this time.  CCS following for assessment and plan of care.  Wound with significant amt nonviable tissue, high risk to evolve into full thickness tissue loss.  Plan: Begin Santyl for chemical debridement of nonviable tissue.  Refer to CCS team for further plan of care and please re-consult if further assistance is needed.  Thank-you,  Cammie Mcgee MSN, RN, CWOCN, [place], CNS [phone]

## 2014-04-09 NOTE — Progress Notes (Signed)
Patient ID: Monica Blankenship, female   DOB: Dec 26, 1928, 80 y.o.   MRN: [id number]  Subjective: Afebrile.  No pain.  Does not turn much.  Incontinent this AM of stool.    Objective:  Vital signs:  Filed Vitals:   04/09/14 0500 04/09/14 0600 04/09/14 0700 04/09/14 0726  BP: 164/69 179/71 182/80 182/80  Pulse: 94 87 84 87  Temp:    98.1 F (36.7 C)  TempSrc:    Axillary  Resp: _0 Height:      Weight: 182 lb 12.2 oz (82.9 kg)     SpO2: 98% 96% 95% 98%    Last BM Date: 04/08/14  Intake/Output   Yesterday:  05/07 0701 - 05/08 0700 In: 1620 [I.V.:570; IV Piggyback:1050] Out: -  This shift:    I/O last 3 completed shifts: In: 3670 [I.V.:2620; IV Piggyback:1050] Out: -     Physical Exam: Skin: sacral decub-small opening with is draining scant amount of purulent drainage.  There is no fluctuance or induration.  There is surrounding erythema.     Problem List:   Active Problems:   Sepsis   Hyperkalemia   Severe sepsis(995.92)   Lactic acid acidosis   Encephalopathy acute   AKI (acute kidney injury)    Results:   Labs: Results for orders placed during the hospital encounter of 04/08/14 (from the past 48 hour(s))  CBC WITH DIFFERENTIAL     Status: Abnormal   Collection Time    04/08/14  2:25 AM      Result Value Ref Range   WBC 15.3 (*) 4.0 - 10.5 K/uL   Comment: WHITE COUNT CONFIRMED ON SMEAR   RBC 3.50 (*) 3.87 - 5.11 MIL/uL   Hemoglobin 11.1 (*) 12.0 - 15.0 g/dL   HCT 35.2 (*) 36.0 - 46.0 %   MCV 100.6 (*) 78.0 - 100.0 fL   MCH 31.7  26.0 - 34.0 pg   MCHC 31.5  30.0 - 36.0 g/dL   RDW 17.7 (*) 11.5 - 15.5 %   Platelets 154  150 - 400 K/uL   Comment: PLATELET COUNT CONFIRMED BY SMEAR   Neutrophils Relative % 82 (*) 43 - 77 %   Lymphocytes Relative 10 (*) 12 - 46 %   Monocytes Relative 7  3 - 12 %   Eosinophils Relative 1  0 - 5 %   Basophils Relative 0  0 - 1 %   Neutro Abs 12.5 (*) 1.7 - 7.7 K/uL   Lymphs Abs 1.5  0.7 - 4.0 K/uL   Monocytes  Absolute 1.1 (*) 0.1 - 1.0 K/uL   Eosinophils Absolute 0.2  0.0 - 0.7 K/uL   Basophils Absolute 0.0  0.0 - 0.1 K/uL   RBC Morphology ELLIPTOCYTES     Comment: POLYCHROMASIA PRESENT     BURR CELLS   WBC Morphology MILD LEFT SHIFT (1-5% METAS, OCC MYELO, OCC BANDS)    COMPREHENSIVE METABOLIC PANEL     Status: Abnormal   Collection Time    04/08/14  2:25 AM      Result Value Ref Range   Sodium 130 (*) 137 - 147 mEq/L   Potassium 5.8 (*) 3.7 - 5.3 mEq/L   Chloride 93 (*) 96 - 112 mEq/L   CO2 22  19 - 32 mEq/L   Glucose, Bld 169 (*) 70 - 99 mg/dL   BUN 17  6 - 23 mg/dL   Creatinine, Ser 1.49 (*) 0.50 - 1.10 mg/dL   Calcium 9.7  8.4 - 10.5 mg/dL   Total Protein 7.2  6.0 - 8.3 g/dL   Albumin 3.2 (*) 3.5 - 5.2 g/dL   AST 36  0 - 37 U/L   ALT 28  0 - 35 U/L   Alkaline Phosphatase 62  39 - 117 U/L   Total Bilirubin 0.6  0.3 - 1.2 mg/dL   GFR calc non Af Amer 32 (*) >90 mL/min   GFR calc Af Amer 37 (*) >90 mL/min   Comment: (NOTE)     The eGFR has been calculated using the CKD EPI equation.     This calculation has not been validated in all clinical situations.     eGFR's persistently <90 mL/min signify possible Chronic Kidney     Disease.  CULTURE, BLOOD (ROUTINE X 2)     Status: None   Collection Time    04/08/14  2:39 AM      Result Value Ref Range   Specimen Description BLOOD LEFT FOREARM     Special Requests BOTTLES DRAWN AEROBIC ONLY [place]     Culture  Setup Time       Value: 04/08/2014 09:01     Performed at [facility]   Culture       Value: [place] IN CLUSTERS     Note: Gram Stain Report Called to,Read Back By and Verified With: ALICA GREEN ON 06/06/3004 AT 9:58P BY WILEJ     Performed at [facility]   Report Status PENDING    SAMPLE TO BLOOD BANK     Status: None   Collection Time    04/08/14  2:46 AM      Result Value Ref Range   Blood Bank Specimen SAMPLE AVAILABLE FOR TESTING     Sample Expiration 04/09/2014    CULTURE, BLOOD (ROUTINE X  2)     Status: None   Collection Time    04/08/14  2:46 AM      Result Value Ref Range   Specimen Description BLOOD LEFT ANTECUBITAL     Special Requests BOTTLES DRAWN AEROBIC AND ANAEROBIC 8CC     Culture  Setup Time       Value: 04/08/2014 09:01     Performed at [facility]   Culture       Value: [place] IN CLUSTERS     Note: Gram Stain Report Called to,Read Back By and Verified With: ALICIA GREEN ON 12/03/209 AT 9:58P BY WILEJ     Performed at [facility]   Report Status PENDING    I-STAT CG4 LACTIC ACID, ED     Status: Abnormal   Collection Time    04/08/14  2:58 AM      Result Value Ref Range   Lactic Acid, Venous 6.83 (*) 0.5 - 2.2 mmol/L  POC OCCULT BLOOD, ED     Status: Abnormal   Collection Time    04/08/14  3:11 AM      Result Value Ref Range   Fecal Occult Bld POSITIVE (*) NEGATIVE  URINALYSIS, ROUTINE W REFLEX MICROSCOPIC     Status: Abnormal   Collection Time    04/08/14  3:20 AM      Result Value Ref Range   Color, Urine YELLOW  YELLOW   APPearance CLOUDY (*) CLEAR   Specific Gravity, Urine 1.022  1.005 - 1.030   pH 6.5  5.0 - 8.0   Glucose, UA NEGATIVE  NEGATIVE mg/dL   Hgb urine dipstick  NEGATIVE  NEGATIVE   Bilirubin Urine NEGATIVE  NEGATIVE   Ketones, ur 15 (*) NEGATIVE mg/dL   Protein, ur 30 (*) NEGATIVE mg/dL   Urobilinogen, UA 0.2  0.0 - 1.0 mg/dL   Nitrite NEGATIVE  NEGATIVE   Leukocytes, UA SMALL (*) NEGATIVE  URINE MICROSCOPIC-ADD ON     Status: Abnormal   Collection Time    04/08/14  3:20 AM      Result Value Ref Range   Squamous Epithelial / LPF RARE  RARE   WBC, UA 11-20  <3 WBC/hpf   RBC / HPF 0-2  <3 RBC/hpf   Bacteria, UA MANY (*) RARE   Casts HYALINE CASTS (*) NEGATIVE  MAGNESIUM     Status: None   Collection Time    04/08/14  7:00 AM      Result Value Ref Range   Magnesium 2.0  1.5 - 2.5 mg/dL  PHOSPHORUS     Status: None   Collection Time    04/08/14  7:00 AM      Result Value Ref Range    Phosphorus 2.8  2.3 - 4.6 mg/dL  BASIC METABOLIC PANEL     Status: Abnormal   Collection Time    04/08/14  7:00 AM      Result Value Ref Range   Sodium 132 (*) 137 - 147 mEq/L   Potassium 6.3 (*) 3.7 - 5.3 mEq/L   Chloride 98  96 - 112 mEq/L   CO2 20  19 - 32 mEq/L   Glucose, Bld 120 (*) 70 - 99 mg/dL   BUN 18  6 - 23 mg/dL   Creatinine, Ser 1.61 (*) 0.50 - 1.10 mg/dL   Calcium 9.1  8.4 - 10.5 mg/dL   GFR calc non Af Amer 29 (*) >90 mL/min   GFR calc Af Amer 33 (*) >90 mL/min   Comment: (NOTE)     The eGFR has been calculated using the CKD EPI equation.     This calculation has not been validated in all clinical situations.     eGFR's persistently <90 mL/min signify possible Chronic Kidney     Disease.  CBC     Status: Abnormal   Collection Time    04/08/14  7:00 AM      Result Value Ref Range   WBC 13.1 (*) 4.0 - 10.5 K/uL   RBC 3.53 (*) 3.87 - 5.11 MIL/uL   Hemoglobin 11.3 (*) 12.0 - 15.0 g/dL   HCT 34.6 (*) 36.0 - 46.0 %   MCV 98.0  78.0 - 100.0 fL   MCH 32.0  26.0 - 34.0 pg   MCHC 32.7  30.0 - 36.0 g/dL   RDW 17.6 (*) 11.5 - 15.5 %   Platelets 134 (*) 150 - 400 K/uL  MRSA PCR SCREENING     Status: None   Collection Time    04/08/14  7:00 AM      Result Value Ref Range   MRSA by PCR NEGATIVE  NEGATIVE   Comment:            The GeneXpert MRSA Assay (FDA     approved for NASAL specimens     only), is one component of a     comprehensive MRSA colonization     surveillance program. It is not     intended to diagnose MRSA     infection nor to guide or     monitor treatment for     MRSA infections.  GLUCOSE,  CAPILLARY     Status: Abnormal   Collection Time    04/08/14  7:54 AM      Result Value Ref Range   Glucose-Capillary 126 (*) 70 - 99 mg/dL  BASIC METABOLIC PANEL     Status: Abnormal   Collection Time    04/08/14  8:05 AM      Result Value Ref Range   Sodium 130 (*) 137 - 147 mEq/L   Potassium 6.4 (*) 3.7 - 5.3 mEq/L   Chloride 96  96 - 112 mEq/L   CO2 21   19 - 32 mEq/L   Glucose, Bld 291 (*) 70 - 99 mg/dL   BUN 18  6 - 23 mg/dL   Creatinine, Ser 1.79 (*) 0.50 - 1.10 mg/dL   Calcium 9.9  8.4 - 10.5 mg/dL   GFR calc non Af Amer 25 (*) >90 mL/min   GFR calc Af Amer 29 (*) >90 mL/min   Comment: (NOTE)     The eGFR has been calculated using the CKD EPI equation.     This calculation has not been validated in all clinical situations.     eGFR's persistently <90 mL/min signify possible Chronic Kidney     Disease.  PROTIME-INR     Status: Abnormal   Collection Time    04/08/14  8:05 AM      Result Value Ref Range   Prothrombin Time 17.2 (*) 11.6 - 15.2 seconds   INR 1.44  0.00 - 1.49  HEPATIC FUNCTION PANEL     Status: Abnormal   Collection Time    04/08/14  8:05 AM      Result Value Ref Range   Total Protein 5.7 (*) 6.0 - 8.3 g/dL   Albumin 2.6 (*) 3.5 - 5.2 g/dL   AST 96 (*) 0 - 37 U/L   ALT 63 (*) 0 - 35 U/L   Alkaline Phosphatase 60  39 - 117 U/L   Total Bilirubin 0.8  0.3 - 1.2 mg/dL   Bilirubin, Direct 0.4 (*) 0.0 - 0.3 mg/dL   Indirect Bilirubin 0.4  0.3 - 0.9 mg/dL  CBC WITH DIFFERENTIAL     Status: Abnormal   Collection Time    04/08/14  8:05 AM      Result Value Ref Range   WBC 14.0 (*) 4.0 - 10.5 K/uL   RBC 3.15 (*) 3.87 - 5.11 MIL/uL   Hemoglobin 10.0 (*) 12.0 - 15.0 g/dL   HCT 31.0 (*) 36.0 - 46.0 %   MCV 98.4  78.0 - 100.0 fL   MCH 31.7  26.0 - 34.0 pg   MCHC 32.3  30.0 - 36.0 g/dL   RDW 17.8 (*) 11.5 - 15.5 %   Platelets 121 (*) 150 - 400 K/uL   Neutrophils Relative % 76  43 - 77 %   Neutro Abs 10.6 (*) 1.7 - 7.7 K/uL   Lymphocytes Relative 15  12 - 46 %   Lymphs Abs 2.1  0.7 - 4.0 K/uL   Monocytes Relative 9  3 - 12 %   Monocytes Absolute 1.3 (*) 0.1 - 1.0 K/uL   Eosinophils Relative 0  0 - 5 %   Eosinophils Absolute 0.0  0.0 - 0.7 K/uL   Basophils Relative 0  0 - 1 %   Basophils Absolute 0.0  0.0 - 0.1 K/uL  MAGNESIUM     Status: None   Collection Time    04/08/14  8:05 AM  Result Value Ref Range    Magnesium 1.9  1.5 - 2.5 mg/dL  PHOSPHORUS     Status: None   Collection Time    04/08/14  8:05 AM      Result Value Ref Range   Phosphorus 3.0  2.3 - 4.6 mg/dL  TROPONIN I     Status: None   Collection Time    04/08/14  8:07 AM      Result Value Ref Range   Troponin I <0.30  <0.30 ng/mL   Comment:            Due to the release kinetics of cTnI,     a negative result within the first hours     of the onset of symptoms does not rule out     myocardial infarction with certainty.     If myocardial infarction is still suspected,     repeat the test at appropriate intervals.  PRO B NATRIURETIC PEPTIDE     Status: Abnormal   Collection Time    04/08/14  8:07 AM      Result Value Ref Range   Pro B Natriuretic peptide (BNP) 5143.0 (*) 0 - 450 pg/mL  PROCALCITONIN     Status: None   Collection Time    04/08/14  8:07 AM      Result Value Ref Range   Procalcitonin 0.87     Comment:            Interpretation:     PCT > 0.5 ng/mL and <= 2 ng/mL:     Systemic infection (sepsis) is possible,     but other conditions are known to elevate     PCT as well.     (NOTE)             ICU PCT Algorithm               Non ICU PCT Algorithm        ----------------------------     ------------------------------             PCT < 0.25 ng/mL                 PCT < 0.1 ng/mL         Stopping of antibiotics            Stopping of antibiotics           strongly encouraged.               strongly encouraged.        ----------------------------     ------------------------------           PCT level decrease by               PCT < 0.25 ng/mL           >= 80% from peak PCT           OR PCT 0.25 - 0.5 ng/mL          Stopping of antibiotics                                                 encouraged.         Stopping of antibiotics               encouraged.        ----------------------------     ------------------------------  PCT level decrease by              PCT >= 0.25 ng/mL           < 80%  from peak PCT            AND PCT >= 0.5 ng/mL            Continuing antibiotics                                                  encouraged.           Continuing antibiotics                encouraged.        ----------------------------     ------------------------------         PCT level increase compared          PCT > 0.5 ng/mL             with peak PCT AND              PCT >= 0.5 ng/mL             Escalation of antibiotics                                              strongly encouraged.          Escalation of antibiotics            strongly encouraged.  BLOOD GAS, ARTERIAL     Status: Abnormal   Collection Time    04/08/14  8:10 AM      Result Value Ref Range   O2 Content 3.0     Delivery systems NASAL CANNULA     pH, Arterial 7.399  7.350 - 7.450   pCO2 arterial 32.8 (*) 35.0 - 45.0 mmHg   pO2, Arterial 65.5 (*) 80.0 - 100.0 mmHg   Bicarbonate 19.9 (*) 20.0 - 24.0 mEq/L   TCO2 20.9  0 - 100 mmol/L   Acid-base deficit 4.1 (*) 0.0 - 2.0 mmol/L   O2 Saturation 91.0     Patient temperature 98.6     Collection site RIGHT RADIAL     Drawn by [phone]     Sample type ARTERIAL DRAW     Allens test (pass/fail) PASS  PASS  MRSA PCR SCREENING     Status: None   Collection Time    04/08/14  8:39 AM      Result Value Ref Range   MRSA by PCR NEGATIVE  NEGATIVE   Comment:            The GeneXpert MRSA Assay (FDA     approved for NASAL specimens     only), is one component of a     comprehensive MRSA colonization     surveillance program. It is not     intended to diagnose MRSA     infection nor to guide or     monitor treatment for     MRSA infections.  POCT I-STAT 3, ART BLOOD GAS (G3+)     Status: Abnormal   Collection Time    04/08/14  9:06 AM  Result Value Ref Range   pH, Arterial 7.379  7.350 - 7.450   pCO2 arterial 34.3 (*) 35.0 - 45.0 mmHg   pO2, Arterial 111.0 (*) 80.0 - 100.0 mmHg   Bicarbonate 20.1  20.0 - 24.0 mEq/L   TCO2 21  0 - 100 mmol/L   O2 Saturation  98.0     Acid-base deficit 4.0 (*) 0.0 - 2.0 mmol/L   Patient temperature 99.9 F     Collection site RADIAL, ALLEN'S TEST ACCEPTABLE     Drawn by VENIPUNCTURE     Sample type ARTERIAL    BASIC METABOLIC PANEL     Status: Abnormal   Collection Time    04/08/14  3:00 PM      Result Value Ref Range   Sodium 136 (*) 137 - 147 mEq/L   Potassium 5.0  3.7 - 5.3 mEq/L   Comment: DELTA CHECK NOTED   Chloride 101  96 - 112 mEq/L   CO2 21  19 - 32 mEq/L   Glucose, Bld 140 (*) 70 - 99 mg/dL   BUN 16  6 - 23 mg/dL   Creatinine, Ser 1.37 (*) 0.50 - 1.10 mg/dL   Calcium 9.5  8.4 - 10.5 mg/dL   GFR calc non Af Amer 35 (*) >90 mL/min   GFR calc Af Amer 40 (*) >90 mL/min   Comment: (NOTE)     The eGFR has been calculated using the CKD EPI equation.     This calculation has not been validated in all clinical situations.     eGFR's persistently <90 mL/min signify possible Chronic Kidney     Disease.  CK TOTAL AND CKMB     Status: Abnormal   Collection Time    04/08/14  3:00 PM      Result Value Ref Range   Total CK 232 (*) 7 - 177 U/L   CK, MB 1.6  0.3 - 4.0 ng/mL   Relative Index 0.7  0.0 - 2.5    Imaging / Studies: Ct Abdomen Pelvis Wo Contrast  04/08/2014   CLINICAL DATA:  Sacral decubitus ulcer.  EXAM: CT ABDOMEN AND PELVIS WITHOUT CONTRAST  TECHNIQUE: Multidetector CT imaging of the abdomen and pelvis was performed following the standard protocol without IV contrast.  COMPARISON:  None.  FINDINGS: The lung bases are grossly clear. Moderate breathing motion artifact. The heart is enlarged. No pericardial effusion. Dense coronary artery calcifications are noted. There is a moderate-sized hiatal hernia.  The liver is grossly normal. No focal lesions or biliary dilatation. The gallbladder is grossly normal no common bile duct dilatation. The pancreas is unremarkable. The spleen is normal in size. No focal lesions. The adrenal glands kidneys are unremarkable. 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 There is a large lower pole left renal cyst.  The stomach, duodenum, small bowel and colon are grossly normal. There is fluid-filled small bowel and colon but no obstruction. Contrast gets all the way to the colon. Does this patient have diarrhea? No mesenteric or retroperitoneal mass or adenopathy. Advanced atherosclerotic calcifications involving the aorta and branch vessels.  The bladder is moderately distended. No pelvic mass or adenopathy. Mild cystocele.  There is marked inflammatory change and probable granulation tissue overlying the sacrum. This appears to grow right down to the bone but I do not see any obvious changes of osteomyelitis. MRI would be more sensitive if the patient does not improve or worsens. No intrapelvic abscess. The lumbar vertebral bodies are normally  aligned. There is severe facet disease noted.  IMPRESSION: Marked edema, inflammation and subcutaneous soft tissue thickening overlying the sacrum without discrete abscess or osteomyelitis. MRI would be more sensitive for further evaluation if the patient is not improve or worsens.  Moderate distention of the bladder.  Slightly dilated fluid feel small bowel and colon. This could represent enterocolitis. No findings for obstruction.  Advanced atherosclerotic calcifications involving the aorta and branch vessels.  Moderate-sized hiatal hernia.   Electronically Signed   By: Kalman Jewels M.D.   On: 04/08/2014 16:54   Dg Chest Port 1 View  04/09/2014   CLINICAL DATA:  Airspace disease  EXAM: PORTABLE CHEST - 1 VIEW  COMPARISON:  DG CHEST 1V PORT dated 04/08/2014; DG CHEST 2 VIEW dated 02/07/2014; DG CHEST 2 VIEW dated 11/17/2013  FINDINGS: Grossly unchanged enlarged cardiac silhouette and mediastinal contours. Overall improved aeration of the lungs with persistent bibasilar heterogeneous opacities. Mild pulmonary venous congestion without frank evidence of edema. No pleural effusion pneumothorax. Grossly unchanged bones.   IMPRESSION: 1. Overall improved aeration of the lungs with persistent bibasilar opacities, likely atelectasis. 2. Cardiomegaly and pulmonary venous congestion without definite evidence of edema.   Electronically Signed   By: Sandi Mariscal M.D.   On: 04/09/2014 07:54   Dg Chest Port 1 View  04/08/2014   CLINICAL DATA:  80 year old female with acute mental status change. Initial encounter.  EXAM: PORTABLE CHEST - 1 VIEW  COMPARISON:  0304 hr the same day and earlier.  FINDINGS: Portable AP semi upright view at 0842 hr. The patient is now rotated to the right. Stable mediastinal contours allowing for the different positioning. Mildly lower lung volumes. No pneumothorax or pleural effusion. Stable increased interstitial markings. No overt edema or consolidation.  IMPRESSION: Mildly lower lung volumes, but otherwise stable allowing for differences in patient positioning.   Electronically Signed   By: Lars Pinks M.D.   On: 04/08/2014 08:58   Dg Chest Port 1 View  04/08/2014   CLINICAL DATA:  Fever and shortness of breath  EXAM: PORTABLE CHEST - 1 VIEW  COMPARISON:  02/07/2014  FINDINGS: Chronic cardiopericardial enlargement. Diffuse and symmetric interstitial coarsening which is above baseline when compared to previous. No effusion or pneumothorax. No evidence of consolidation.  IMPRESSION: 1. No definitive pneumonia. 2. Suspect mild CHF.   Electronically Signed   By: Jorje Guild M.D.   On: 04/08/2014 03:48    Medications / Allergies: per chart  Antibiotics: Anti-infectives   Start     Dose/Rate Route Frequency Ordered Stop   04/08/14 1800  clindamycin (CLEOCIN) IVPB 600 mg     600 mg 100 mL/hr over 30 Minutes Intravenous 4 times per day 04/08/14 1433     04/08/14 1000  vancomycin (VANCOCIN) IVPB 1000 mg/200 mL premix     1,000 mg 200 mL/hr over 60 Minutes Intravenous Every 24 hours 04/08/14 0857     04/08/14 0900  piperacillin-tazobactam (ZOSYN) IVPB 3.375 g     3.375 g 12.5 mL/hr over 240 Minutes  Intravenous 3 times per day 04/08/14 0857     04/08/14 0400  cefTRIAXone (ROCEPHIN) 1 g in dextrose 5 % 50 mL IVPB     1 g 100 mL/hr over 30 Minutes Intravenous  Once 04/08/14 0347 04/08/14 0515      Assessment/Plan Sacral decubitus ulcer -Will re-evaluate tomorrow for a developing abscess.  There is no evidence of necrotizing infection.   -Continue wound care, frequent turning -surgery will follow  Emina  [place], [facility] Pager [phone] Office [phone]  04/09/2014 8:09 AM   Buttocks wound.  Agree with above. This will require I&D.  Alphonsa Overall, MD, [facility] Pager: [phone] Office phone:  [phone]

## 2014-04-10 ENCOUNTER — Inpatient Hospital Stay (HOSPITAL_COMMUNITY): Payer: Medicare PPO

## 2014-04-10 DIAGNOSIS — R410 Disorientation, unspecified: Secondary | ICD-10-CM

## 2014-04-10 DIAGNOSIS — L03319 Cellulitis of trunk, unspecified: Secondary | ICD-10-CM

## 2014-04-10 DIAGNOSIS — L02219 Cutaneous abscess of trunk, unspecified: Secondary | ICD-10-CM

## 2014-04-10 DIAGNOSIS — L0231 Cutaneous abscess of buttock: Secondary | ICD-10-CM

## 2014-04-10 LAB — PHOSPHORUS: PHOSPHORUS: 2.7 mg/dL (ref 2.3–4.6)

## 2014-04-10 LAB — COMPREHENSIVE METABOLIC PANEL
ALT: 58 U/L — ABNORMAL HIGH (ref 0–35)
AST: 39 U/L — ABNORMAL HIGH (ref 0–37)
Albumin: 2.9 g/dL — ABNORMAL LOW (ref 3.5–5.2)
Alkaline Phosphatase: 68 U/L (ref 39–117)
BILIRUBIN TOTAL: 0.8 mg/dL (ref 0.3–1.2)
BUN: 15 mg/dL (ref 6–23)
CHLORIDE: 104 meq/L (ref 96–112)
CO2: 27 mEq/L (ref 19–32)
Calcium: 9.1 mg/dL (ref 8.4–10.5)
Creatinine, Ser: 1.02 mg/dL (ref 0.50–1.10)
GFR calc non Af Amer: 50 mL/min — ABNORMAL LOW (ref >90)
GFR, EST AFRICAN AMERICAN: 58 mL/min — AB (ref >90)
GLUCOSE: 110 mg/dL — AB (ref 70–99)
Potassium: 3.1 mEq/L — ABNORMAL LOW (ref 3.7–5.3)
Sodium: 145 mEq/L (ref 137–147)
Total Protein: 6.6 g/dL (ref 6.0–8.3)

## 2014-04-10 LAB — CULTURE, BLOOD (ROUTINE X 2)

## 2014-04-10 LAB — CBC WITH DIFFERENTIAL/PLATELET
BASOS PCT: 0 % (ref 0–1)
Basophils Absolute: 0 10*3/uL (ref 0.0–0.1)
EOS ABS: 0 10*3/uL (ref 0.0–0.7)
EOS PCT: 0 % (ref 0–5)
HCT: 32.7 % — ABNORMAL LOW (ref 36.0–46.0)
Hemoglobin: 10.6 g/dL — ABNORMAL LOW (ref 12.0–15.0)
Lymphocytes Relative: 13 % (ref 12–46)
Lymphs Abs: 1.7 10*3/uL (ref 0.7–4.0)
MCH: 31.6 pg (ref 26.0–34.0)
MCHC: 32.4 g/dL (ref 30.0–36.0)
MCV: 97.6 fL (ref 78.0–100.0)
Monocytes Absolute: 1.3 10*3/uL — ABNORMAL HIGH (ref 0.1–1.0)
Monocytes Relative: 10 % (ref 3–12)
NEUTROS PCT: 77 % (ref 43–77)
Neutro Abs: 10.5 10*3/uL — ABNORMAL HIGH (ref 1.7–7.7)
PLATELETS: 154 10*3/uL (ref 150–400)
RBC: 3.35 MIL/uL — ABNORMAL LOW (ref 3.87–5.11)
RDW: 17.5 % — ABNORMAL HIGH (ref 11.5–15.5)
WBC: 13.6 10*3/uL — ABNORMAL HIGH (ref 4.0–10.5)

## 2014-04-10 LAB — PROCALCITONIN: Procalcitonin: 1.23 ng/mL

## 2014-04-10 LAB — URINE CULTURE

## 2014-04-10 LAB — MAGNESIUM: MAGNESIUM: 2 mg/dL (ref 1.5–2.5)

## 2014-04-10 MED ORDER — FUROSEMIDE 40 MG PO TABS
40.0000 mg | ORAL_TABLET | Freq: Every day | ORAL | Status: DC
  Administered 2014-04-10 – 2014-04-14 (×5): 40 mg via ORAL
  Filled 2014-04-10 (×5): qty 1

## 2014-04-10 MED ORDER — FENTANYL CITRATE 0.05 MG/ML IJ SOLN
50.0000 ug | Freq: Once | INTRAMUSCULAR | Status: AC
  Administered 2014-04-10: 50 ug via INTRAVENOUS
  Filled 2014-04-10: qty 2

## 2014-04-10 MED ORDER — TRAMADOL HCL 50 MG PO TABS
50.0000 mg | ORAL_TABLET | Freq: Four times a day (QID) | ORAL | Status: DC | PRN
  Administered 2014-04-11: 50 mg via ORAL
  Filled 2014-04-10: qty 1

## 2014-04-10 MED ORDER — ACETAMINOPHEN 500 MG PO TABS
1000.0000 mg | ORAL_TABLET | Freq: Three times a day (TID) | ORAL | Status: DC
  Administered 2014-04-10 – 2014-04-14 (×12): 1000 mg via ORAL
  Filled 2014-04-10 (×15): qty 2

## 2014-04-10 MED ORDER — IPRATROPIUM-ALBUTEROL 0.5-2.5 (3) MG/3ML IN SOLN: 3.0000 mL | RESPIRATORY_TRACT | Status: DC | PRN

## 2014-04-10 MED ORDER — POTASSIUM CHLORIDE CRYS ER 20 MEQ PO TBCR
40.0000 meq | EXTENDED_RELEASE_TABLET | Freq: Once | ORAL | Status: AC
  Administered 2014-04-10: 40 meq via ORAL
  Filled 2014-04-10: qty 2

## 2014-04-10 MED ORDER — POTASSIUM CHLORIDE CRYS ER 20 MEQ PO TBCR
20.0000 meq | EXTENDED_RELEASE_TABLET | Freq: Every day | ORAL | Status: DC
  Administered 2014-04-10 – 2014-04-12 (×3): 20 meq via ORAL
  Filled 2014-04-10 (×4): qty 1

## 2014-04-10 MED ORDER — CEFAZOLIN SODIUM 1-5 GM-% IV SOLN
1.0000 g | Freq: Three times a day (TID) | INTRAVENOUS | Status: DC
  Administered 2014-04-10 – 2014-04-12 (×6): 1 g via INTRAVENOUS
  Filled 2014-04-10 (×9): qty 50

## 2014-04-10 NOTE — Progress Notes (Signed)
Patient noted out of bed with gown, oxygen, and monitor wires off. Patient confused, easily reoriented. Assisted patient back to bed. Bed alarm on. Call bell in reach. Will monitor.

## 2014-04-10 NOTE — Progress Notes (Signed)
Admission note:   Arrival Method: Transfer from [facility] via wheelchair. Mental Status: A&OX4, intermittent confusion per 2H RN.  Telemetry: Placed on box #15. CMT Darryl notified.   Skin: Pt had an I&D on a right buttock wound 04/09/14. Pt states she fell and injured it at home.  Tubes: N/A IV: Left forearm 04/10/14 Pain: Denies.  Family: No one at bedside. Living Situation: From home. Safety Measures: Call bell within reach. Bed alarm in place. 6E Orientation: Oriented to unit and surroundings.   Monica Sassaman Arvella Nigh, RN

## 2014-04-10 NOTE — Progress Notes (Addendum)
Alma, MD, [address] [place], [place] [address] Phone: [phone] FAX: [place] [phone] 08/08/1929  CARE TEAM:  PCP: Wenda Low, MD  Outpatient Care Team: Patient Care Team: Wenda Low, MD as PCP - General (Internal Medicine)  Inpatient Treatment Team: Treatment Team: Attending Provider: Wenda Low, MD; Respiratory Therapist: Philomena Doheny, RRT; Rounding Team: Md Edison Pace, MD; Registered Nurse: Blima Rich, RN   Subjective:  Sore Very confused on fentanyl - switched off RN in room  Objective:  Vital signs:  Filed Vitals:   04/10/14 0118 04/10/14 0345 04/10/14 0405 04/10/14 0500  BP:  150/71    Pulse: 56 71 69   Temp:      TempSrc:      Resp: _0 Height:      Weight:    182 lb 5.1 oz (82.7 kg)  SpO2: 98% 94%      Last BM Date: 04/10/14  Intake/Output   Yesterday:  05/08 0701 - 05/09 0700 In: 6568 [P.O.:720; I.V.:4870; IV Piggyback:350] Out: 127 [Urine:800; Stool:1] This shift:     Bowel function:  Flatus: y  BM: y  Drain: n/a  Physical Exam:  General: Pt awake/alert/oriented x4 in no acute distress Eyes: PERRL, normal EOM.  Sclera clear.  No icterus Neuro: CN II-XII intact w/o focal sensory/motor deficits. Lymph: No head/neck/groin lymphadenopathy Psych:  No delerium/psychosis/paranoia HENT: Normocephalic, Mucus membranes moist.  No thrush Neck: Supple, No tracheal deviation Chest: No chest wall pain w good excursion CV:  Pulses intact.  Regular rhythm MS: Normal AROM mjr joints.  No obvious deformity Abdomen: Soft.  Nondistended.  No evidence of peritonitis.  No incarcerated hernias.  Right upper gluteal wounds (1x1x1cm, 3x3x2cm) open w some coagulum.  Dressing changed.  1-2cm surrounding cellulitis  Ext:  SCDs BLE.  No mjr edema.  No cyanosis Skin: No petechiae / purpura   Problem List:   Active Problems:    Sepsis   Hyperkalemia   Severe sepsis(995.92)   Lactic acid acidosis   Encephalopathy acute   AKI (acute kidney injury)   Abscess, gluteal s/p I&D 04/09/2014   Delirium   Assessment  Monica Blankenship  80 y.o. female       Improving s/p I&D gluteal abscess x2  Plan:  -ABx - agree w broad for now until cultures come back.  Prob change to doxy 100 BID x 7 more days w [facility] outpt surgery f/u.  Pt wishes HH for dressing changes - ?may go to SNF.  Defer to primary on dispo w/u -daily dressing changes -tylenol RTC pain control & different narcotics -will f/u Sunday if still here -VTE prophylaxis- SCDs, etc -mobilize as tolerated to help recovery  I updated the patient's status to the patient.  Recommendations were made.  Questions were answered.  The patient expressed understanding & appreciation.   Adin Hector, M.D., F.A.C.S. Gastrointestinal and Minimally Invasive Surgery [facility] [address] [place], [place] [address] [phone] Main / Paging   04/10/2014   Results:   Labs: Results for orders placed during the hospital encounter of 04/08/14 (from the past 48 hour(s))  MRSA PCR SCREENING     Status: None   Collection Time    04/08/14  8:39 AM      Result Value Ref Range   MRSA by PCR NEGATIVE  NEGATIVE  Comment:            The GeneXpert MRSA Assay (FDA     approved for NASAL specimens     only), is one component of a     comprehensive MRSA colonization     surveillance program. It is not     intended to diagnose MRSA     infection nor to guide or     monitor treatment for     MRSA infections.  POCT I-STAT 3, ART BLOOD GAS (G3+)     Status: Abnormal   Collection Time    04/08/14  9:06 AM      Result Value Ref Range   pH, Arterial 7.379  7.350 - 7.450   pCO2 arterial 34.3 (*) 35.0 - 45.0 mmHg   pO2, Arterial 111.0 (*) 80.0 - 100.0 mmHg   Bicarbonate 20.1  20.0 - 24.0 mEq/L   TCO2 21  0 - 100 mmol/L   O2 Saturation  98.0     Acid-base deficit 4.0 (*) 0.0 - 2.0 mmol/L   Patient temperature 99.9 F     Collection site RADIAL, ALLEN'S TEST ACCEPTABLE     Drawn by VENIPUNCTURE     Sample type ARTERIAL    BASIC METABOLIC PANEL     Status: Abnormal   Collection Time    04/08/14  3:00 PM      Result Value Ref Range   Sodium 136 (*) 137 - 147 mEq/L   Potassium 5.0  3.7 - 5.3 mEq/L   Comment: DELTA CHECK NOTED   Chloride 101  96 - 112 mEq/L   CO2 21  19 - 32 mEq/L   Glucose, Bld 140 (*) 70 - 99 mg/dL   BUN 16  6 - 23 mg/dL   Creatinine, Ser 1.37 (*) 0.50 - 1.10 mg/dL   Calcium 9.5  8.4 - 10.5 mg/dL   GFR calc non Af Amer 35 (*) >90 mL/min   GFR calc Af Amer 40 (*) >90 mL/min   Comment: (NOTE)     The eGFR has been calculated using the CKD EPI equation.     This calculation has not been validated in all clinical situations.     eGFR's persistently <90 mL/min signify possible Chronic Kidney     Disease.  CK TOTAL AND CKMB     Status: Abnormal   Collection Time    04/08/14  3:00 PM      Result Value Ref Range   Total CK 232 (*) 7 - 177 U/L   CK, MB 1.6  0.3 - 4.0 ng/mL   Relative Index 0.7  0.0 - 2.5  CBC WITH DIFFERENTIAL     Status: Abnormal   Collection Time    04/09/14  9:09 AM      Result Value Ref Range   WBC 14.8 (*) 4.0 - 10.5 K/uL   RBC 3.45 (*) 3.87 - 5.11 MIL/uL   Hemoglobin 11.0 (*) 12.0 - 15.0 g/dL   HCT 33.1 (*) 36.0 - 46.0 %   MCV 95.9  78.0 - 100.0 fL   MCH 31.9  26.0 - 34.0 pg   MCHC 33.2  30.0 - 36.0 g/dL   RDW 17.6 (*) 11.5 - 15.5 %   Platelets 146 (*) 150 - 400 K/uL   Neutrophils Relative % 89 (*) 43 - 77 %   Neutro Abs 13.3 (*) 1.7 - 7.7 K/uL   Lymphocytes Relative 5 (*) 12 - 46 %   Lymphs Abs 0.7  0.7 - 4.0 K/uL   Monocytes Relative 6  3 - 12 %   Monocytes Absolute 0.8  0.1 - 1.0 K/uL   Eosinophils Relative 0  0 - 5 %   Eosinophils Absolute 0.0  0.0 - 0.7 K/uL   Basophils Relative 0  0 - 1 %   Basophils Absolute 0.0  0.0 - 0.1 K/uL  PHOSPHORUS     Status: None    Collection Time    04/09/14  9:09 AM      Result Value Ref Range   Phosphorus 3.9  2.3 - 4.6 mg/dL  MAGNESIUM     Status: None   Collection Time    04/09/14  9:09 AM      Result Value Ref Range   Magnesium 2.1  1.5 - 2.5 mg/dL  BASIC METABOLIC PANEL     Status: Abnormal   Collection Time    04/09/14  9:09 AM      Result Value Ref Range   Sodium 141  137 - 147 mEq/L   Potassium 3.5 (*) 3.7 - 5.3 mEq/L   Chloride 102  96 - 112 mEq/L   CO2 24  19 - 32 mEq/L   Glucose, Bld 157 (*) 70 - 99 mg/dL   BUN 13  6 - 23 mg/dL   Creatinine, Ser 0.92  0.50 - 1.10 mg/dL   Calcium 9.4  8.4 - 10.5 mg/dL   GFR calc non Af Amer 56 (*) >90 mL/min   GFR calc Af Amer 65 (*) >90 mL/min   Comment: (NOTE)     The eGFR has been calculated using the CKD EPI equation.     This calculation has not been validated in all clinical situations.     eGFR's persistently <90 mL/min signify possible Chronic Kidney     Disease.  PROCALCITONIN     Status: None   Collection Time    04/09/14  9:09 AM      Result Value Ref Range   Procalcitonin 0.99     Comment:            Interpretation:     PCT > 0.5 ng/mL and <= 2 ng/mL:     Systemic infection (sepsis) is possible,     but other conditions are known to elevate     PCT as well.     (NOTE)             ICU PCT Algorithm               Non ICU PCT Algorithm        ----------------------------     ------------------------------             PCT < 0.25 ng/mL                 PCT < 0.1 ng/mL         Stopping of antibiotics            Stopping of antibiotics           strongly encouraged.               strongly encouraged.        ----------------------------     ------------------------------           PCT level decrease by               PCT < 0.25 ng/mL           >= 80% from peak  PCT           OR PCT 0.25 - 0.5 ng/mL          Stopping of antibiotics                                                 encouraged.         Stopping of antibiotics               encouraged.         ----------------------------     ------------------------------           PCT level decrease by              PCT >= 0.25 ng/mL           < 80% from peak PCT            AND PCT >= 0.5 ng/mL            Continuing antibiotics                                                  encouraged.           Continuing antibiotics                encouraged.        ----------------------------     ------------------------------         PCT level increase compared          PCT > 0.5 ng/mL             with peak PCT AND              PCT >= 0.5 ng/mL             Escalation of antibiotics                                              strongly encouraged.          Escalation of antibiotics            strongly encouraged.  PRO B NATRIURETIC PEPTIDE     Status: Abnormal   Collection Time    04/09/14  9:09 AM      Result Value Ref Range   Pro B Natriuretic peptide (BNP) 16685.0 (*) 0 - 450 pg/mL  CBC WITH DIFFERENTIAL     Status: Abnormal   Collection Time    04/10/14  4:25 AM      Result Value Ref Range   WBC 13.6 (*) 4.0 - 10.5 K/uL   RBC 3.35 (*) 3.87 - 5.11 MIL/uL   Hemoglobin 10.6 (*) 12.0 - 15.0 g/dL   HCT 32.7 (*) 36.0 - 46.0 %   MCV 97.6  78.0 - 100.0 fL   MCH 31.6  26.0 - 34.0 pg   MCHC 32.4  30.0 - 36.0 g/dL   RDW 17.5 (*) 11.5 - 15.5 %   Platelets 154  150 - 400 K/uL   Neutrophils Relative % 77  43 - 77 %   Neutro Abs 10.5 (*)  1.7 - 7.7 K/uL   Lymphocytes Relative 13  12 - 46 %   Lymphs Abs 1.7  0.7 - 4.0 K/uL   Monocytes Relative 10  3 - 12 %   Monocytes Absolute 1.3 (*) 0.1 - 1.0 K/uL   Eosinophils Relative 0  0 - 5 %   Eosinophils Absolute 0.0  0.0 - 0.7 K/uL   Basophils Relative 0  0 - 1 %   Basophils Absolute 0.0  0.0 - 0.1 K/uL  PHOSPHORUS     Status: None   Collection Time    04/10/14  4:25 AM      Result Value Ref Range   Phosphorus 2.7  2.3 - 4.6 mg/dL  MAGNESIUM     Status: None   Collection Time    04/10/14  4:25 AM      Result Value Ref Range   Magnesium 2.0   1.5 - 2.5 mg/dL  PROCALCITONIN     Status: None   Collection Time    04/10/14  4:25 AM      Result Value Ref Range   Procalcitonin 1.23     Comment:            Interpretation:     PCT > 0.5 ng/mL and <= 2 ng/mL:     Systemic infection (sepsis) is possible,     but other conditions are known to elevate     PCT as well.     (NOTE)             ICU PCT Algorithm               Non ICU PCT Algorithm        ----------------------------     ------------------------------             PCT < 0.25 ng/mL                 PCT < 0.1 ng/mL         Stopping of antibiotics            Stopping of antibiotics           strongly encouraged.               strongly encouraged.        ----------------------------     ------------------------------           PCT level decrease by               PCT < 0.25 ng/mL           >= 80% from peak PCT           OR PCT 0.25 - 0.5 ng/mL          Stopping of antibiotics                                                 encouraged.         Stopping of antibiotics               encouraged.        ----------------------------     ------------------------------           PCT level decrease by              PCT >= 0.25 ng/mL           <  80% from peak PCT            AND PCT >= 0.5 ng/mL            Continuing antibiotics                                                  encouraged.           Continuing antibiotics                encouraged.        ----------------------------     ------------------------------         PCT level increase compared          PCT > 0.5 ng/mL             with peak PCT AND              PCT >= 0.5 ng/mL             Escalation of antibiotics                                              strongly encouraged.          Escalation of antibiotics            strongly encouraged.  COMPREHENSIVE METABOLIC PANEL     Status: Abnormal   Collection Time    04/10/14  4:25 AM      Result Value Ref Range   Sodium 145  137 - 147 mEq/L   Potassium 3.1 (*) 3.7 - 5.3  mEq/L   Chloride 104  96 - 112 mEq/L   CO2 27  19 - 32 mEq/L   Glucose, Bld 110 (*) 70 - 99 mg/dL   BUN 15  6 - 23 mg/dL   Creatinine, Ser 1.02  0.50 - 1.10 mg/dL   Calcium 9.1  8.4 - 10.5 mg/dL   Total Protein 6.6  6.0 - 8.3 g/dL   Albumin 2.9 (*) 3.5 - 5.2 g/dL   AST 39 (*) 0 - 37 U/L   ALT 58 (*) 0 - 35 U/L   Alkaline Phosphatase 68  39 - 117 U/L   Total Bilirubin 0.8  0.3 - 1.2 mg/dL   GFR calc non Af Amer 50 (*) >90 mL/min   GFR calc Af Amer 58 (*) >90 mL/min   Comment: (NOTE)     The eGFR has been calculated using the CKD EPI equation.     This calculation has not been validated in all clinical situations.     eGFR's persistently <90 mL/min signify possible Chronic Kidney     Disease.    Imaging / Studies: Ct Abdomen Pelvis Wo Contrast  04/08/2014   CLINICAL DATA:  Sacral decubitus ulcer.  EXAM: CT ABDOMEN AND PELVIS WITHOUT CONTRAST  TECHNIQUE: Multidetector CT imaging of the abdomen and pelvis was performed following the standard protocol without IV contrast.  COMPARISON:  None.  FINDINGS: The lung bases are grossly clear. Moderate breathing motion artifact. The heart is enlarged. No pericardial effusion. Dense coronary artery calcifications are noted. There is a moderate-sized hiatal hernia.  The liver is grossly normal. No focal lesions or biliary dilatation. The gallbladder is grossly  normal no common bile duct dilatation. The pancreas is unremarkable. The spleen is normal in size. No focal lesions. The adrenal glands kidneys are unremarkable. A small right renal artery aneurysm is suspected. There is a large lower pole left renal cyst.  The stomach, duodenum, small bowel and colon are grossly normal. There is fluid-filled small bowel and colon but no obstruction. Contrast gets all the way to the colon. Does this patient have diarrhea? No mesenteric or retroperitoneal mass or adenopathy. Advanced atherosclerotic calcifications involving the aorta and branch vessels.  The bladder is  moderately distended. No pelvic mass or adenopathy. Mild cystocele.  There is marked inflammatory change and probable granulation tissue overlying the sacrum. This appears to grow right down to the bone but I do not see any obvious changes of osteomyelitis. MRI would be more sensitive if the patient does not improve or worsens. No intrapelvic abscess. The lumbar vertebral bodies are normally aligned. There is severe facet disease noted.  IMPRESSION: Marked edema, inflammation and subcutaneous soft tissue thickening overlying the sacrum without discrete abscess or osteomyelitis. MRI would be more sensitive for further evaluation if the patient is not improve or worsens.  Moderate distention of the bladder.  Slightly dilated fluid feel small bowel and colon. This could represent enterocolitis. No findings for obstruction.  Advanced atherosclerotic calcifications involving the aorta and branch vessels.  Moderate-sized hiatal hernia.   Electronically Signed   By: Kalman Jewels M.D.   On: 04/08/2014 16:54   Dg Chest Port 1 View  04/09/2014   CLINICAL DATA:  Airspace disease  EXAM: PORTABLE CHEST - 1 VIEW  COMPARISON:  DG CHEST 1V PORT dated 04/08/2014; DG CHEST 2 VIEW dated 02/07/2014; DG CHEST 2 VIEW dated 11/17/2013  FINDINGS: Grossly unchanged enlarged cardiac silhouette and mediastinal contours. Overall improved aeration of the lungs with persistent bibasilar heterogeneous opacities. Mild pulmonary venous congestion without frank evidence of edema. No pleural effusion pneumothorax. Grossly unchanged bones.  IMPRESSION: 1. Overall improved aeration of the lungs with persistent bibasilar opacities, likely atelectasis. 2. Cardiomegaly and pulmonary venous congestion without definite evidence of edema.   Electronically Signed   By: Sandi Mariscal M.D.   On: 04/09/2014 07:54   Dg Chest Port 1 View  04/08/2014   CLINICAL DATA:  80 year old female with acute mental status change. Initial encounter.  EXAM: PORTABLE CHEST - 1  VIEW  COMPARISON:  0304 hr the same day and earlier.  FINDINGS: Portable AP semi upright view at 0842 hr. The patient is now rotated to the right. Stable mediastinal contours allowing for the different positioning. Mildly lower lung volumes. No pneumothorax or pleural effusion. Stable increased interstitial markings. No overt edema or consolidation.  IMPRESSION: Mildly lower lung volumes, but otherwise stable allowing for differences in patient positioning.   Electronically Signed   By: Lars Pinks M.D.   On: 04/08/2014 08:58    Medications / Allergies: per chart  Antibiotics: Anti-infectives   Start     Dose/Rate Route Frequency Ordered Stop   04/08/14 1800  clindamycin (CLEOCIN) IVPB 600 mg  Status:  Discontinued     600 mg 100 mL/hr over 30 Minutes Intravenous 4 times per day 04/08/14 1433 04/09/14 1047   04/08/14 1000  vancomycin (VANCOCIN) IVPB 1000 mg/200 mL premix     1,000 mg 200 mL/hr over 60 Minutes Intravenous Every 24 hours 04/08/14 0857     04/08/14 0900  piperacillin-tazobactam (ZOSYN) IVPB 3.375 g     3.375 g 12.5 mL/hr over  240 Minutes Intravenous 3 times per day 04/08/14 0857     04/08/14 0400  cefTRIAXone (ROCEPHIN) 1 g in dextrose 5 % 50 mL IVPB     1 g 100 mL/hr over 30 Minutes Intravenous  Once 04/08/14 0347 04/08/14 0515       Note: This dictation was prepared with Dragon/digital dictation along with Smartphrase technology. Any transcriptional errors that result from this process are unintentional.

## 2014-04-10 NOTE — Progress Notes (Signed)
Dressing changed to pt's right buttock wound as she had an incontinent episode. Santyl ointment was not available at this time. Wound packed with moist gauze and covered with an ABD. Surrounding skin is non-blanchable in some areas.   Jayzen Paver Arvella Nigh, RN

## 2014-04-10 NOTE — Progress Notes (Signed)
Subjective: Pt feel some better BP high   Objective: Vital signs in last 24 hours: Temp:  [98.2 F (36.8 C)-99.3 F (37.4 C)] 99.3 F (37.4 C) (05/09 0823) Pulse Rate:  [56-92] 69 (05/09 0405) Resp:  [10-23] 15 (05/09 0823) BP: (150-198)/(31-168) 185/31 mmHg (05/09 0823) SpO2:  [94 %-100 %] 96 % (05/09 0823) Weight:  [82.7 kg (182 lb 5.1 oz)] 82.7 kg (182 lb 5.1 oz) (05/09 0500) Weight change: -0.2 kg (-7.1 oz) Last BM Date: 04/10/14  Intake/Output from previous day: 05/08 0701 - 05/09 0700 In: 5940 [P.O.:720; I.V.:4870; IV Piggyback:350] Out: 801 [Urine:800; Stool:1] Intake/Output this shift:    General appearance: alert Resp: clear to auscultation bilaterally Cardio: regular rate and rhythm and systolic murmur: early systolic 2/6, crescendo at 2nd left intercostal space Extremities: extremities normal, atraumatic, no cyanosis or edema Skin: Skin color, texture, turgor normal. No rashes or lesions or sacral decubiti- s/p I&D- with mim drainage  Lab Results:  Recent Labs  04/09/14 0909 04/10/14 0425  WBC 14.8* 13.6*  HGB 11.0* 10.6*  HCT 33.1* 32.7*  PLT 146* 154   BMET  Recent Labs  04/09/14 0909 04/10/14 0425  NA 141 145  K 3.5* 3.1*  CL 102 104  CO2 24 27  GLUCOSE 157* 110*  BUN 13 15  CREATININE 0.92 1.02  CALCIUM 9.4 9.1    Studies/Results: Ct Abdomen Pelvis Wo Contrast  04/08/2014   CLINICAL DATA:  Sacral decubitus ulcer.  EXAM: CT ABDOMEN AND PELVIS WITHOUT CONTRAST  TECHNIQUE: Multidetector CT imaging of the abdomen and pelvis was performed following the standard protocol without IV contrast.  COMPARISON:  None.  FINDINGS: The lung bases are grossly clear. Moderate breathing motion artifact. The heart is enlarged. No pericardial effusion. Dense coronary artery calcifications are noted. There is a moderate-sized hiatal hernia.  The liver is grossly normal. No focal lesions or biliary dilatation. The gallbladder is grossly normal no common bile duct  dilatation. The pancreas is unremarkable. The spleen is normal in size. No focal lesions. The adrenal glands kidneys are unremarkable. A small right renal artery aneurysm is suspected. There is a large lower pole left renal cyst.  The stomach, duodenum, small bowel and colon are grossly normal. There is fluid-filled small bowel and colon but no obstruction. Contrast gets all the way to the colon. Does this patient have diarrhea? No mesenteric or retroperitoneal mass or adenopathy. Advanced atherosclerotic calcifications involving the aorta and branch vessels.  The bladder is moderately distended. No pelvic mass or adenopathy. Mild cystocele.  There is marked inflammatory change and probable granulation tissue overlying the sacrum. This appears to grow right down to the bone but I do not see any obvious changes of osteomyelitis. MRI would be more sensitive if the patient does not improve or worsens. No intrapelvic abscess. The lumbar vertebral bodies are normally aligned. There is severe facet disease noted.  IMPRESSION: Marked edema, inflammation and subcutaneous soft tissue thickening overlying the sacrum without discrete abscess or osteomyelitis. MRI would be more sensitive for further evaluation if the patient is not improve or worsens.  Moderate distention of the bladder.  Slightly dilated fluid feel small bowel and colon. This could represent enterocolitis. No findings for obstruction.  Advanced atherosclerotic calcifications involving the aorta and branch vessels.  Moderate-sized hiatal hernia.   Electronically Signed   By: Loralie Champagne M.D.   On: 04/08/2014 16:54   Dg Chest Port 1 View  04/09/2014   CLINICAL DATA:  Airspace disease  EXAM: PORTABLE CHEST - 1 VIEW  COMPARISON:  DG CHEST 1V PORT dated 04/08/2014; DG CHEST 2 VIEW dated 02/07/2014; DG CHEST 2 VIEW dated 11/17/2013  FINDINGS: Grossly unchanged enlarged cardiac silhouette and mediastinal contours. Overall improved aeration of the lungs with  persistent bibasilar heterogeneous opacities. Mild pulmonary venous congestion without frank evidence of edema. No pleural effusion pneumothorax. Grossly unchanged bones.  IMPRESSION: 1. Overall improved aeration of the lungs with persistent bibasilar opacities, likely atelectasis. 2. Cardiomegaly and pulmonary venous congestion without definite evidence of edema.   Electronically Signed   By: Simonne Come M.D.   On: 04/09/2014 07:54    Medications: I have reviewed the patient's current medications.  Assessment/Plan: Sepsis/ Stap Bacteremia- urine source- sacral decubiti-  Continue Antibiotic- ID consult to recommend length of antibiotic- may need PICC line.   Hypotension - due to sepsis resolved BP high- IVF- heplock Sacral Decubiti- with abscess- s/p Surgical I &D- CT- - no osteomyelitis: wound care per surgery. Hyperkalemia revolved in setting of sepsis and ARF- improved- - now with low K- replace CHF- Chronic Diastolic HF- resume lasix OSA/ Pulmonary HTN- O2- Echo - ok RA: off MTX; continue Prednisone- chronic prednisone- 7.5 mg daily A/C Renal failure - better Nutrition: advance diet Social: PT consult; will need SNF- rehab- family in agreement consult social work.    LOS: 2 days   Georgann Housekeeper 04/10/2014, 9:11 AM

## 2014-04-10 NOTE — Progress Notes (Signed)
ANTIBIOTIC CONSULT NOTE - INITIAL  Pharmacy Consult for Ancef Indication: Bacteremia  Allergies  Allergen Reactions  . Fentanyl Other (See Comments)    confusion    Patient Measurements: Height: 5\' 2"  (157.5 cm) Weight: 182 lb 5.1 oz (82.7 kg) IBW/kg (Calculated) : 50.1   Vital Signs: Temp: 98.8 F (37.1 C) (05/09 1252) Temp src: Oral (05/09 1252) BP: 144/77 mmHg (05/09 1252) Pulse Rate: 61 (05/09 1252) Intake/Output from previous day: 05/08 0701 - 05/09 0700 In: 5940 [P.O.:720; I.V.:4870; IV Piggyback:350] Out: 801 [Urine:800; Stool:1] Intake/Output from this shift:    Labs:  Recent Labs  04/08/14 0805 04/08/14 1500 04/09/14 0909 04/10/14 0425  WBC 14.0*  --  14.8* 13.6*  HGB 10.0*  --  11.0* 10.6*  PLT 121*  --  146* 154  CREATININE 1.79* 1.37* 0.92 1.02   Estimated Creatinine Clearance: 42.4 ml/min (by C-G formula based on Cr of 1.02). No results found for this basename: VANCOTROUGH, Leodis Binet, VANCORANDOM, GENTTROUGH, GENTPEAK, GENTRANDOM, TOBRATROUGH, TOBRAPEAK, TOBRARND, AMIKACINPEAK, AMIKACINTROU, AMIKACIN,  in the last 72 hours   Microbiology: Recent Results (from the past 720 hour(s))  CULTURE, BLOOD (ROUTINE X 2)     Status: None   Collection Time    04/08/14  2:39 AM      Result Value Ref Range Status   Specimen Description BLOOD LEFT FOREARM   Final   Special Requests BOTTLES DRAWN AEROBIC ONLY [facility]   Final   Culture  Setup Time     Final   Value: 04/08/2014 09:01     Performed at [facility]   Culture     Final   Value: STAPHYLOCOCCUS AUREUS     Note: RIFAMPIN AND GENTAMICIN SHOULD NOT BE USED AS SINGLE DRUGS FOR TREATMENT OF STAPH INFECTIONS. This organism DOES NOT demonstrate inducible Clindamycin resistance in vitro.     Note: Gram Stain Report Called to,Read Back By and Verified With: ALICA GREEN ON 04/08/2014 AT 9:58P BY WILEJ     Performed at [facility]   Report Status 04/10/2014 FINAL   Final   Organism ID, Bacteria  STAPHYLOCOCCUS AUREUS   Final  CULTURE, BLOOD (ROUTINE X 2)     Status: None   Collection Time    04/08/14  2:46 AM      Result Value Ref Range Status   Specimen Description BLOOD LEFT ANTECUBITAL   Final   Special Requests BOTTLES DRAWN AEROBIC AND ANAEROBIC 8CC   Final   Culture  Setup Time     Final   Value: 04/08/2014 09:01     Performed at [facility]   Culture     Final   Value: STAPHYLOCOCCUS AUREUS     Note: SUSCEPTIBILITIES PERFORMED ON PREVIOUS CULTURE WITHIN THE LAST 5 DAYS.     Note: Gram Stain Report Called to,Read Back By and Verified With: ALICIA GREEN ON 04/08/2014 AT 9:58P BY WILEJ     Performed at [facility]   Report Status 04/10/2014 FINAL   Final  URINE CULTURE     Status: None   Collection Time    04/08/14  3:20 AM      Result Value Ref Range Status   Specimen Description URINE, CATHETERIZED   Final   Special Requests NONE   Final   Culture  Setup Time     Final   Value: 04/08/2014 09:05     Performed at [facility] Count     Final  Value: >=100,000 COLONIES/ML     Performed at [facility]   Culture     Final   Value: STAPHYLOCOCCUS SPECIES     Note: RIFAMPIN AND GENTAMICIN SHOULD NOT BE USED AS SINGLE DRUGS FOR TREATMENT OF STAPH INFECTIONS.     Performed at [facility]   Report Status PENDING   Incomplete  MRSA PCR SCREENING     Status: None   Collection Time    04/08/14  7:00 AM      Result Value Ref Range Status   MRSA by PCR NEGATIVE  NEGATIVE Final   Comment:            The GeneXpert MRSA Assay (FDA     approved for NASAL specimens     only), is one component of a     comprehensive MRSA colonization     surveillance program. It is not     intended to diagnose MRSA     infection nor to guide or     monitor treatment for     MRSA infections.  MRSA PCR SCREENING     Status: None   Collection Time    04/08/14  8:39 AM      Result Value Ref Range Status   MRSA by PCR NEGATIVE  NEGATIVE  Final   Comment:            The GeneXpert MRSA Assay (FDA     approved for NASAL specimens     only), is one component of a     comprehensive MRSA colonization     surveillance program. It is not     intended to diagnose MRSA     infection nor to guide or     monitor treatment for     MRSA infections.  WOUND CULTURE     Status: None   Collection Time    04/09/14  4:19 PM      Result Value Ref Range Status   Specimen Description WOUND RIGHT BUTTOCKS   Final   Special Requests NONE   Final   Gram Stain     Final   Value: RARE WBC PRESENT,BOTH PMN AND MONONUCLEAR     NO SQUAMOUS EPITHELIAL CELLS SEEN     NO ORGANISMS SEEN     Performed at [facility]   Culture PENDING   Incomplete   Report Status PENDING   Incomplete    Medical History: Past Medical History  Diagnosis Date  . Allergy   . Anxiety   . Insomnia   . Osteoarthritis   . Anemia   . Fatigue   . Obesity   . Diastolic dysfunction   . CKD (chronic kidney disease)   . Mitral stenosis     with moderate MR on echo 4/14  . Aortic stenosis     mild by echo 4/14  . Aortic regurgitation     mild to moderate by echo 4/14  . HTN (hypertension)   . Chronic diastolic CHF (congestive heart failure)     Medications:  Scheduled:  . acetaminophen  1,000 mg Oral TID  . albuterol  2.5 mg Nebulization Q4H  . calcium gluconate  1 g Intravenous Once  . collagenase   Topical Daily  . doxazosin  8 mg Oral QHS  . furosemide  40 mg Oral Daily  . heparin  5,000 Units Subcutaneous 3 times per day  . metoprolol tartrate  25 mg Oral BID  . potassium chloride  20  mEq Oral Daily  . predniSONE  7.5 mg Oral Q breakfast  . sodium chloride  1,000 mL Intravenous Once  . sodium chloride  2,000 mL Intravenous Once  . verapamil  240 mg Oral QHS   Assessment: 84yof admitted with confusion, weakness, chills, diarrhea and hypotension. Patient was with fevers and elevated WBC.  UA was cloudy with leukocytes Cxr no pna. Started on  Vancomycin and Zosyn, now narrowed to Ancef based on culture results.  Blood cultures grew 2 out of 2 MSSA.  WBC are currently trending down, and patient is afebrile.    Goal of Therapy:  Resolution of infection  Plan:  -Start Ancef 1g IV q8H -Monitor fevers, WBC, renal function  Anabel Bene, PharmD Clinical Pharmacist Pager: [phone] Anabel Bene 04/10/2014,2:45 PM

## 2014-04-10 NOTE — Consult Note (Signed)
[facility]     Reason for Consult: Staph aureus bacteremia    Referring Physician: Dr. Donette Larry  Active Problems:   Sepsis   Hyperkalemia   Severe sepsis(995.92)   Lactic acid acidosis   Encephalopathy acute   AKI (acute kidney injury)   Abscess, gluteal s/p I&D 04/09/2014   Delirium   . acetaminophen  1,000 mg Oral TID  . albuterol  2.5 mg Nebulization Q4H  . calcium gluconate  1 g Intravenous Once  . collagenase   Topical Daily  . doxazosin  8 mg Oral QHS  . furosemide  40 mg Oral Daily  . heparin  5,000 Units Subcutaneous 3 times per day  . metoprolol tartrate  25 mg Oral BID  . potassium chloride  20 mEq Oral Daily  . predniSONE  7.5 mg Oral Q breakfast  . sodium chloride  1,000 mL Intravenous Once  . sodium chloride  2,000 mL Intravenous Once  . verapamil  240 mg Oral QHS    Recommendations: Cefazolin Will need TEE Repeat blood cultures to assure clearance pic when repeat blood cultures are negative at least 48 hours  Will need prolonged IV cefazolin with duration dependent on TEE.    Assessment: She has staph aureus bacteremia likely source of sacral abscess.  No osteomyelitis noted on CT.    Antibiotics: Vancomycin and zosyn  HPI: Monica Blankenship is a 80 y.o. female with a history of pulmonary hypertension, who presented 5/7 with confusion, hypotesnion, elevated lactate concerning for sepsis.  Addmitted to ICU and not our on floor.  Blood cultures 2/2 with MSSA.  Feeling better at this point.  TTE negative for vegetation.     Review of Systems: A comprehensive review of systems was negative.  Past Medical History  Diagnosis Date  . Allergy   . Anxiety   . Insomnia   . Osteoarthritis   . Anemia   . Fatigue   . Obesity   . Diastolic dysfunction   . CKD (chronic kidney disease)   . Mitral stenosis     with moderate MR on echo 4/14  . Aortic stenosis     mild by echo 4/14  . Aortic regurgitation     mild to moderate  by echo 4/14  . HTN (hypertension)   . Chronic diastolic CHF (congestive heart failure)     History  Substance Use Topics  . Smoking status: Former Smoker -- 0.25 packs/day for 1 years  . Smokeless tobacco: Never Used     Comment: smoked 1-3 cigs/day in school for about a year some days only  . Alcohol Use: No    Family History  Problem Relation Age of Onset  . Hypertension Mother   . CVA Mother    Allergies  Allergen Reactions  . Fentanyl Other (See Comments)    confusion    OBJECTIVE: Blood pressure 144/77, pulse 61, temperature 98.8 F (37.1 C), temperature source Oral, resp. rate 16, height 5\' 2"  (1.575 m), weight 182 lb 5.1 oz (82.7 kg), SpO2 97.00%. General: awake, in chair, nad Skin: no rash Lungs: CTA B Cor: RRR without m Abdomen: soft, nt, nd Ext; no edema  Microbiology: Recent Results (from the past 240 hour(s))  CULTURE, BLOOD (ROUTINE X 2)     Status: None   Collection Time    04/08/14  2:39 AM      Result Value Ref Range Status   Specimen Description BLOOD LEFT FOREARM   Final  Special Requests BOTTLES DRAWN AEROBIC ONLY [facility]   Final   Culture  Setup Time     Final   Value: 04/08/2014 09:01     [facility]   Culture     Final   Value: STAPHYLOCOCCUS AUREUS     Note: RIFAMPIN AND GENTAMICIN SHOULD NOT BE USED AS SINGLE DRUGS FOR TREATMENT OF STAPH INFECTIONS. This organism DOES NOT demonstrate inducible Clindamycin resistance in vitro.     Note: Gram Stain Report Called to,Read Back By and Verified With: ALICA GREEN ON 04/08/2014 AT 9:58P BY WILEJ     [facility]   Report Status 04/10/2014 FINAL   Final   Organism ID, Bacteria STAPHYLOCOCCUS AUREUS   Final  CULTURE, BLOOD (ROUTINE X 2)     Status: None   Collection Time    04/08/14  2:46 AM      Result Value Ref Range Status   Specimen Description BLOOD LEFT ANTECUBITAL   Final   Special Requests BOTTLES DRAWN AEROBIC AND ANAEROBIC 8CC   Final   Culture   Setup Time     Final   Value: 04/08/2014 09:01     [facility]formed at [facility]   Culture     Final   Value: STAPHYLOCOCCUS AUREUS     Note: SUSCEPTIBILITIES PERFORMED ON PREVIOUS CULTURE WITHIN THE LAST 5 DAYS.     Note: Gram Stain Report Called to,Read Back By and Verified With: ALICIA GREEN ON 04/08/2014 AT 9:58P BY WILEJ     [facility]   Report Status 04/10/2014 FINAL   Final  URINE CULTURE     Status: None   Collection Time    04/08/14  3:20 AM      Result Value Ref Range Status   Specimen Description URINE, CATHETERIZED   Final   Special Requests NONE   Final   Culture  Setup Time     Final   Value: 04/08/2014 09:05     [facility]   Colony Count     Final   Value: >=100,000 COLONIES/ML     [facility] at [facility]   Culture     Final   Value: STAPHYLOCOCCUS SPECIES     Note: RIFAMPIN AND GENTAMICIN SHOULD NOT BE USED AS SINGLE DRUGS FOR TREATMENT OF STAPH INFECTIONS.     Performed at [facility]   Report Status PENDING   Incomplete  MRSA PCR SCREENING     Status: None   Collection Time    04/08/14  7:00 AM      Result Value Ref Range Status   MRSA by PCR NEGATIVE  NEGATIVE Final   Comment:            The GeneXpert MRSA Assay (FDA     approved for NASAL specimens     only), is one component of a     comprehensive MRSA colonization     surveillance program. It is not     intended to diagnose MRSA     infection nor to guide or     monitor treatment for     MRSA infections.  MRSA PCR SCREENING     Status: None   Collection Time    04/08/14  8:39 AM      Result Value Ref Range Status   MRSA by PCR NEGATIVE  NEGATIVE Final   Comment:  The GeneXpert MRSA Assay (FDA     approved for NASAL specimens     only), is one component of a     comprehensive MRSA colonization     surveillance program. It is not     intended to diagnose MRSA     infection nor to guide or     monitor treatment for      MRSA infections.  WOUND CULTURE     Status: None   Collection Time    04/09/14  4:19 PM      Result Value Ref Range Status   Specimen Description WOUND RIGHT BUTTOCKS   Final   Special Requests NONE   Final   Gram Stain     Final   Value: RARE WBC PRESENT,BOTH PMN AND MONONUCLEAR     NO SQUAMOUS EPITHELIAL CELLS SEEN     NO ORGANISMS SEEN     Performed at [facility]   Culture PENDING   Incomplete   Report Status PENDING   Incomplete    Gardiner Barefoot, MD [facility] [email or web address] [phone] pager  [phone] cell 04/10/2014, 2:25 PM

## 2014-04-11 LAB — BASIC METABOLIC PANEL
BUN: 13 mg/dL (ref 6–23)
CO2: 24 mEq/L (ref 19–32)
Calcium: 8.9 mg/dL (ref 8.4–10.5)
Chloride: 103 mEq/L (ref 96–112)
Creatinine, Ser: 0.76 mg/dL (ref 0.50–1.10)
GFR calc Af Amer: 89 mL/min — ABNORMAL LOW (ref >90)
GFR calc non Af Amer: 76 mL/min — ABNORMAL LOW (ref >90)
Glucose, Bld: 86 mg/dL (ref 70–99)
Potassium: 4.5 mEq/L (ref 3.7–5.3)
Sodium: 143 mEq/L (ref 137–147)

## 2014-04-11 LAB — CBC WITH DIFFERENTIAL/PLATELET
Basophils Absolute: 0 10*3/uL (ref 0.0–0.1)
Basophils Relative: 0 % (ref 0–1)
EOS PCT: 1 % (ref 0–5)
Eosinophils Absolute: 0.1 10*3/uL (ref 0.0–0.7)
HCT: 33.6 % — ABNORMAL LOW (ref 36.0–46.0)
Hemoglobin: 11 g/dL — ABNORMAL LOW (ref 12.0–15.0)
Lymphocytes Relative: 20 % (ref 12–46)
Lymphs Abs: 2 10*3/uL (ref 0.7–4.0)
MCH: 32.1 pg (ref 26.0–34.0)
MCHC: 32.7 g/dL (ref 30.0–36.0)
MCV: 98 fL (ref 78.0–100.0)
Monocytes Absolute: 0.8 10*3/uL (ref 0.1–1.0)
Monocytes Relative: 8 % (ref 3–12)
Neutro Abs: 7.2 10*3/uL (ref 1.7–7.7)
Neutrophils Relative %: 71 % (ref 43–77)
PLATELETS: 208 10*3/uL (ref 150–400)
RBC: 3.43 MIL/uL — ABNORMAL LOW (ref 3.87–5.11)
RDW: 17.8 % — ABNORMAL HIGH (ref 11.5–15.5)
WBC: 10.2 10*3/uL (ref 4.0–10.5)

## 2014-04-11 LAB — MAGNESIUM: Magnesium: 2 mg/dL (ref 1.5–2.5)

## 2014-04-11 LAB — PHOSPHORUS: PHOSPHORUS: 2.9 mg/dL (ref 2.3–4.6)

## 2014-04-11 NOTE — Progress Notes (Signed)
Subjective: Pt feel better/ no pain in buttock area  Objective: Vital signs in last 24 hours: Temp:  [98.3 F (36.8 C)-98.9 F (37.2 C)] 98.3 F (36.8 C) (05/10 0501) Pulse Rate:  [61-74] 66 (05/10 0501) Resp:  [16] 16 (05/10 0501) BP: (143-180)/(46-77) 161/71 mmHg (05/10 0501) SpO2:  [97 %-100 %] 100 % (05/10 0501) Weight:  [88.451 kg (195 lb)] 88.451 kg (195 lb) (05/09 2036) Weight change: 5.751 kg (12 lb 10.9 oz) Last BM Date: 04/10/14  Intake/Output from previous day: 05/09 0701 - 05/10 0700 In: 630 [P.O.:480; IV Piggyback:150] Out: 4 [Urine:2; Stool:2] Intake/Output this shift:    General appearance: alert Resp: clear to auscultation bilaterally Cardio: regular rate and rhythm and 2/6 SM Skin: buttock Abscess with packing - clean  Lab Results:  Recent Labs  04/10/14 0425 04/11/14 0530  WBC 13.6* 10.2  HGB 10.6* 11.0*  HCT 32.7* 33.6*  PLT 154 208   BMET  Recent Labs  04/10/14 0425 04/11/14 0530  NA 145 143  K 3.1* 4.5  CL 104 103  CO2 27 24  GLUCOSE 110* 86  BUN 15 13  CREATININE 1.02 0.76  CALCIUM 9.1 8.9    Studies/Results: Dg Chest Port 1 View  04/10/2014   CLINICAL DATA:  Best pulmonary edema  EXAM: PORTABLE CHEST - 1 VIEW  COMPARISON:  Prior chest x-ray 04/09/2014  FINDINGS: Stable cardiomegaly. Aortic atherosclerosis. Lower inspiratory volumes results in a pulmonary vascular crowding which can exaggerate the appearance of pulmonary edema. Given differences in inspiration, the degree of edema is similar to slightly increased. No pneumothorax common new airspace consolidation or definite pleural effusion. No acute osseous abnormality.  IMPRESSION: 1. Similar to slightly increased pulmonary edema. 2. Lower inspiratory volumes.   Electronically Signed   By: Malachy Moan M.D.   On: 04/10/2014 13:00    Medications: I have reviewed the patient's current medications.  Assessment/Plan: Sepsis/ Stap Bacteremia- urine source- sacral decubiti-  Continue Antibiotic- ID consult - noted- - TEE requested to further evaluate SBE; 2 D echo- ok Cardiology consult for TEE Blood culture sent again- 04/10/14 Hypotension - due to sepsis resolved BP high- IVF- heplock  Sacral Decubiti- with abscess- s/p Surgical I &D- CT- - no osteomyelitis: wound care per surgery.  Hyperkalemia revolved in setting of sepsis and ARF- improved- - now with low K- replace  CHF- Chronic Diastolic HF- resume lasix  OSA/ Pulmonary HTN- O2- Echo - ok  RA: off MTX; continue Prednisone- chronic prednisone- 7.5 mg daily  A/C Renal failure - better  Nutrition: advance diet  Social: PT consult; will need SNF- rehab- family in agreement consult social work.   LOS: 3 days   Georgann Housekeeper 04/11/2014, 8:23 AM

## 2014-04-11 NOTE — Progress Notes (Signed)
[facility]  Date of Admission:  04/08/2014  Antibiotics: Cefazolin day 2 Antibiotics day 4  Subjective: No complaints  Objective: Temp:  [98.3 F (36.8 C)-98.9 F (37.2 C)] 98.3 F (36.8 C) (05/10 0501) Pulse Rate:  [66-74] 66 (05/10 0501) Resp:  [16] 16 (05/10 0501) BP: (161-180)/(68-71) 161/71 mmHg (05/10 0501) SpO2:  [97 %-100 %] 100 % (05/10 0501) Weight:  [195 lb (88.451 kg)] 195 lb (88.451 kg) (05/09 2036)  General: Awake, in chair, nad Skin: no rashes Lungs: CTA B Cor: RRR Abdomen: soft nt nd   Lab Results Lab Results  Component Value Date   WBC 10.2 04/11/2014   HGB 11.0* 04/11/2014   HCT 33.6* 04/11/2014   MCV 98.0 04/11/2014   PLT 208 04/11/2014    Lab Results  Component Value Date   CREATININE 0.76 04/11/2014   BUN 13 04/11/2014   NA 143 04/11/2014   K 4.5 04/11/2014   CL 103 04/11/2014   CO2 24 04/11/2014    Lab Results  Component Value Date   ALT 58* 04/10/2014   AST 39* 04/10/2014   ALKPHOS 68 04/10/2014   BILITOT 0.8 04/10/2014      Microbiology: Recent Results (from the past 240 hour(s))  CULTURE, BLOOD (ROUTINE X 2)     Status: None   Collection Time    04/08/14  2:39 AM      Result Value Ref Range Status   Specimen Description BLOOD LEFT FOREARM   Final   Special Requests BOTTLES DRAWN AEROBIC ONLY [facility]   Final   Culture  Setup Time     Final   Value: 04/08/2014 09:01     Performed at [facility]   Culture     Final   Value: STAPHYLOCOCCUS AUREUS     Note: RIFAMPIN AND GENTAMICIN SHOULD NOT BE USED AS SINGLE DRUGS FOR TREATMENT OF STAPH INFECTIONS. This organism DOES NOT demonstrate inducible Clindamycin resistance in vitro.     Note: Gram Stain Report Called to,Read Back By and Verified With: ALICA GREEN ON 04/08/2014 AT 9:58P BY WILEJ     Performed at [facility]   Report Status 04/10/2014 FINAL   Final   Organism ID, Bacteria STAPHYLOCOCCUS AUREUS   Final  CULTURE, BLOOD (ROUTINE X 2)     Status:  None   Collection Time    04/08/14  2:46 AM      Result Value Ref Range Status   Specimen Description BLOOD LEFT ANTECUBITAL   Final   Special Requests BOTTLES DRAWN AEROBIC AND ANAEROBIC 8CC   Final   Culture  Setup Time     Final   Value: 04/08/2014 09:01     Performed at [facility]   Culture     Final   Value: STAPHYLOCOCCUS AUREUS     Note: SUSCEPTIBILITIES PERFORMED ON PREVIOUS CULTURE WITHIN THE LAST 5 DAYS.     Note: Gram Stain Report Called to,Read Back By and Verified With: ALICIA GREEN ON 04/08/2014 AT 9:58P BY WILEJ     Performed at [facility]   Report Status 04/10/2014 FINAL   Final  URINE CULTURE     Status: None   Collection Time    04/08/14  3:20 AM      Result Value Ref Range Status   Specimen Description URINE, CATHETERIZED   Final   Special Requests NONE   Final   Culture  Setup Time     Final  Value: 04/08/2014 09:05     Performed at [facility] Count     Final   Value: >=100,000 COLONIES/ML     Performed at [facility]   Culture     Final   Value: STAPHYLOCOCCUS AUREUS     Note: RIFAMPIN AND GENTAMICIN SHOULD NOT BE USED AS SINGLE DRUGS FOR TREATMENT OF STAPH INFECTIONS.     Performed at [facility]   Report Status 04/10/2014 FINAL   Final   Organism ID, Bacteria STAPHYLOCOCCUS AUREUS   Final  MRSA PCR SCREENING     Status: None   Collection Time    04/08/14  7:00 AM      Result Value Ref Range Status   MRSA by PCR NEGATIVE  NEGATIVE Final   Comment:            The GeneXpert MRSA Assay (FDA     approved for NASAL specimens     only), is one component of a     comprehensive MRSA colonization     surveillance program. It is not     intended to diagnose MRSA     infection nor to guide or     monitor treatment for     MRSA infections.  MRSA PCR SCREENING     Status: None   Collection Time    04/08/14  8:39 AM      Result Value Ref Range Status   MRSA by PCR NEGATIVE  NEGATIVE Final   Comment:             The GeneXpert MRSA Assay (FDA     approved for NASAL specimens     only), is one component of a     comprehensive MRSA colonization     surveillance program. It is not     intended to diagnose MRSA     infection nor to guide or     monitor treatment for     MRSA infections.  WOUND CULTURE     Status: None   Collection Time    04/09/14  4:19 PM      Result Value Ref Range Status   Specimen Description WOUND RIGHT BUTTOCKS   Final   Special Requests NONE   Final   Gram Stain     Final   Value: RARE WBC PRESENT,BOTH PMN AND MONONUCLEAR     NO SQUAMOUS EPITHELIAL CELLS SEEN     NO ORGANISMS SEEN     Performed at [facility]   Culture PENDING   Incomplete   Report Status PENDING   Incomplete  CULTURE, BLOOD (ROUTINE X 2)     Status: None   Collection Time    04/10/14  3:30 PM      Result Value Ref Range Status   Specimen Description BLOOD RIGHT HAND   Final   Special Requests BOTTLES DRAWN AEROBIC ONLY 3CC   Final   Culture  Setup Time     Final   Value: 04/10/2014 23:03     Performed at [facility]   Culture     Final   Value:        BLOOD CULTURE RECEIVED NO GROWTH TO DATE CULTURE WILL BE HELD FOR 5 DAYS BEFORE ISSUING A FINAL NEGATIVE REPORT     Performed at [facility]   Report Status PENDING   Incomplete  CULTURE, BLOOD (ROUTINE X 2)     Status: None   Collection Time  04/10/14  3:40 PM      Result Value Ref Range Status   Specimen Description BLOOD RIGHT HAND   Final   Special Requests BOTTLES DRAWN AEROBIC ONLY 5CC   Final   Culture  Setup Time     Final   Value: 04/10/2014 23:03     Performed at [facility]   Culture     Final   Value:        BLOOD CULTURE RECEIVED NO GROWTH TO DATE CULTURE WILL BE HELD FOR 5 DAYS BEFORE ISSUING A FINAL NEGATIVE REPORT     Performed at [facility]   Report Status PENDING   Incomplete    Studies/Results: Dg Chest Port 1 View  04/10/2014   CLINICAL DATA:  Best pulmonary edema   EXAM: PORTABLE CHEST - 1 VIEW  COMPARISON:  Prior chest x-ray 04/09/2014  FINDINGS: Stable cardiomegaly. Aortic atherosclerosis. Lower inspiratory volumes results in a pulmonary vascular crowding which can exaggerate the appearance of pulmonary edema. Given differences in inspiration, the degree of edema is similar to slightly increased. No pneumothorax common new airspace consolidation or definite pleural effusion. No acute osseous abnormality.  IMPRESSION: 1. Similar to slightly increased pulmonary edema. 2. Lower inspiratory volumes.   Electronically Signed   By: Malachy Moan M.D.   On: 04/10/2014 13:00    Assessment/Plan: 1) MSSA bacteremia - sacral abscess.  TEE tomorrow.  Repeat blood cultures ngtd.    Dr. Ninetta Lights to follow up tomorrow.  Gardiner Barefoot, MD [facility] [email or web address] [phone] pager   [phone] cell 04/11/2014, 1:30 PM

## 2014-04-11 NOTE — Evaluation (Signed)
Physical Therapy Evaluation Patient Details Name: Monica Blankenship MRN: [id number] DOB: [birth year] Today's Date: 04/11/2014   History of Present Illness  Pt is an 80 y/o female admitted with sepsis.   Clinical Impression  Pt admitted with the above. Pt currently with functional limitations due to the deficits listed below (see PT Problem List). At the time of PT eval, pt was motivated to participate and although slow, was able to ambulate a fair distance with therapist. Pt will benefit from skilled PT to increase their independence and safety with mobility to allow discharge to the venue listed below.    Follow Up Recommendations Home health PT;Supervision for mobility/OOB    Equipment Recommendations  None recommended by PT    Recommendations for Other Services       Precautions / Restrictions Precautions Precautions: Fall Restrictions Weight Bearing Restrictions: No      Mobility  Bed Mobility               General bed mobility comments: Pt sitting in recliner upon arrival.   Transfers Overall transfer level: Needs assistance Equipment used: Straight cane Transfers: Sit to/from Stand Sit to Stand: Min assist         General transfer comment: Min assist for steadying balance and to power-up to full standing.   Ambulation/Gait Ambulation/Gait assistance: Min guard Ambulation Distance (Feet): 150 Feet Assistive device: Straight cane Gait Pattern/deviations: Step-through pattern;Decreased stride length;Narrow base of support;Trunk flexed Gait velocity: Decreased Gait velocity interpretation: Below normal speed for age/gender General Gait Details: VC's for sequencing and safety awareness with the SPC. Pt motivated to ambulate increased distance.  Stairs            Wheelchair Mobility    Modified Rankin (Stroke Patients Only)       Balance Overall balance assessment: Needs assistance Sitting-balance support: Feet supported;No upper extremity  supported Sitting balance-Leahy Scale: Fair     Standing balance support: Single extremity supported Standing balance-Leahy Scale: Fair                               Pertinent Vitals/Pain Pt has no complaints throughout session.     Home Living Family/patient expects to be discharged to:: Private residence Living Arrangements: Spouse/significant other;Children Available Help at Discharge: Family;Available 24 hours/day Type of Home: House Home Access: Stairs to enter Entrance Stairs-Rails: Right;Left;Can reach both Entrance Stairs-Number of Steps: 4 Home Layout: One level Home Equipment: Cane - single point;Grab bars - toilet;Grab bars - tub/shower      Prior Function Level of Independence: Needs assistance   Gait / Transfers Assistance Needed: Cane occasionally  ADL's / Homemaking Assistance Needed: Assist getting in and out of shower, able to dress independently        Hand Dominance   Dominant Hand: Left    Extremity/Trunk Assessment   Upper Extremity Assessment: Defer to OT evaluation           Lower Extremity Assessment: Generalized weakness      Cervical / Trunk Assessment: Kyphotic (forward head posture)  Communication   Communication: No difficulties  Cognition Arousal/Alertness: Awake/alert Behavior During Therapy: WFL for tasks assessed/performed Overall Cognitive Status: Within Functional Limits for tasks assessed                      General Comments      Exercises        Assessment/Plan    PT Assessment  Patient needs continued PT services  PT Diagnosis Difficulty walking;Generalized weakness   PT Problem List Decreased strength;Decreased range of motion;Decreased activity tolerance;Decreased balance;Decreased mobility;Decreased knowledge of use of DME;Decreased safety awareness;Decreased knowledge of precautions  PT Treatment Interventions DME instruction;Gait training;Stair training;Functional mobility  training;Therapeutic activities;Therapeutic exercise;Neuromuscular re-education;Patient/family education   PT Goals (Current goals can be found in the Care Plan section) Acute Rehab PT Goals Patient Stated Goal: To return home PT Goal Formulation: With patient/family Time For Goal Achievement: 04/18/14 Potential to Achieve Goals: Good    Frequency Min 3X/week   Barriers to discharge        Co-evaluation               End of Session Equipment Utilized During Treatment: Gait belt Activity Tolerance: Patient tolerated treatment well Patient left: in chair;with chair alarm set;with call bell/phone within reach;with family/visitor present Nurse Communication: Mobility status         Time: 1320-1345 PT Time Calculation (min): 25 min   Charges:   PT Evaluation $Initial PT Evaluation Tier I: 1 Procedure PT Treatments $Therapeutic Activity: 8-22 mins   PT G CodesRuthann Cancer 04/11/2014, 3:49 PM  Ruthann Cancer, PT, DPT Acute Rehabilitation Services Pager: [phone]

## 2014-04-12 ENCOUNTER — Telehealth: Payer: Self-pay | Admitting: Cardiology

## 2014-04-12 DIAGNOSIS — R7881 Bacteremia: Secondary | ICD-10-CM

## 2014-04-12 DIAGNOSIS — A4901 Methicillin susceptible Staphylococcus aureus infection, unspecified site: Secondary | ICD-10-CM

## 2014-04-12 DIAGNOSIS — I2789 Other specified pulmonary heart diseases: Secondary | ICD-10-CM

## 2014-04-12 DIAGNOSIS — L89109 Pressure ulcer of unspecified part of back, unspecified stage: Secondary | ICD-10-CM

## 2014-04-12 DIAGNOSIS — A419 Sepsis, unspecified organism: Secondary | ICD-10-CM | POA: Diagnosis not present

## 2014-04-12 DIAGNOSIS — L8990 Pressure ulcer of unspecified site, unspecified stage: Secondary | ICD-10-CM

## 2014-04-12 DIAGNOSIS — M069 Rheumatoid arthritis, unspecified: Secondary | ICD-10-CM

## 2014-04-12 DIAGNOSIS — A412 Sepsis due to unspecified staphylococcus: Secondary | ICD-10-CM | POA: Diagnosis not present

## 2014-04-12 LAB — CBC WITH DIFFERENTIAL/PLATELET
BASOS ABS: 0 10*3/uL (ref 0.0–0.1)
Basophils Relative: 0 % (ref 0–1)
EOS ABS: 0.2 10*3/uL (ref 0.0–0.7)
EOS PCT: 2 % (ref 0–5)
HEMATOCRIT: 36.5 % (ref 36.0–46.0)
Hemoglobin: 11.5 g/dL — ABNORMAL LOW (ref 12.0–15.0)
LYMPHS PCT: 24 % (ref 12–46)
Lymphs Abs: 2.3 10*3/uL (ref 0.7–4.0)
MCH: 31.4 pg (ref 26.0–34.0)
MCHC: 31.5 g/dL (ref 30.0–36.0)
MCV: 99.7 fL (ref 78.0–100.0)
Monocytes Absolute: 0.6 10*3/uL (ref 0.1–1.0)
Monocytes Relative: 7 % (ref 3–12)
Neutro Abs: 6.3 10*3/uL (ref 1.7–7.7)
Neutrophils Relative %: 67 % (ref 43–77)
Platelets: 211 10*3/uL (ref 150–400)
RBC: 3.66 MIL/uL — ABNORMAL LOW (ref 3.87–5.11)
RDW: 17.9 % — AB (ref 11.5–15.5)
WBC: 9.4 10*3/uL (ref 4.0–10.5)

## 2014-04-12 LAB — BASIC METABOLIC PANEL
BUN: 11 mg/dL (ref 6–23)
CO2: 29 mEq/L (ref 19–32)
CREATININE: 0.86 mg/dL (ref 0.50–1.10)
Calcium: 9 mg/dL (ref 8.4–10.5)
Chloride: 103 mEq/L (ref 96–112)
GFR calc Af Amer: 71 mL/min — ABNORMAL LOW (ref >90)
GFR, EST NON AFRICAN AMERICAN: 61 mL/min — AB (ref >90)
GLUCOSE: 82 mg/dL (ref 70–99)
Potassium: 3.2 mEq/L — ABNORMAL LOW (ref 3.7–5.3)
Sodium: 146 mEq/L (ref 137–147)

## 2014-04-12 LAB — MAGNESIUM: Magnesium: 2 mg/dL (ref 1.5–2.5)

## 2014-04-12 LAB — PHOSPHORUS: Phosphorus: 3 mg/dL (ref 2.3–4.6)

## 2014-04-12 MED ORDER — CEFAZOLIN SODIUM-DEXTROSE 2-3 GM-% IV SOLR
2.0000 g | Freq: Three times a day (TID) | INTRAVENOUS | Status: DC
  Administered 2014-04-12 – 2014-04-14 (×6): 2 g via INTRAVENOUS
  Filled 2014-04-12 (×9): qty 50

## 2014-04-12 MED ORDER — SODIUM CHLORIDE 0.9 % IJ SOLN
10.0000 mL | INTRAMUSCULAR | Status: DC | PRN
  Administered 2014-04-14: 10 mL

## 2014-04-12 MED ORDER — SODIUM CHLORIDE 0.9 % IV SOLN
INTRAVENOUS | Status: DC
  Administered 2014-04-13: 500 mL via INTRAVENOUS

## 2014-04-12 NOTE — Progress Notes (Signed)
Subjective: Pt feel ok; no c/o Ambulation with PT awaiting TEE today.  Objective: Vital signs in last 24 hours: Temp:  [98.2 F (36.8 C)-98.3 F (36.8 C)] 98.2 F (36.8 C) (05/11 0527) Pulse Rate:  [56-72] 72 (05/11 0527) Resp:  [17-18] 18 (05/11 0527) BP: (145-162)/(51-70) 145/56 mmHg (05/11 0527) SpO2:  [93 %-100 %] 96 % (05/11 0527) Weight:  [86.592 kg (190 lb 14.4 oz)] 86.592 kg (190 lb 14.4 oz) (05/10 2025) Weight change: -1.86 kg (-4 lb 1.6 oz) Last BM Date: 04/11/14  Intake/Output from previous day: 05/10 0701 - 05/11 0700 In: 480 [P.O.:480] Out: 2 [Urine:2] Intake/Output this shift:    General appearance: alert Resp: clear to auscultation bilaterally Cardio: regular rate and rhythm  Lab Results:  Recent Labs  04/11/14 0530 04/12/14 0545  WBC 10.2 9.4  HGB 11.0* 11.5*  HCT 33.6* 36.5  PLT 208 211   BMET  Recent Labs  04/10/14 0425 04/11/14 0530  NA 145 143  K 3.1* 4.5  CL 104 103  CO2 27 24  GLUCOSE 110* 86  BUN 15 13  CREATININE 1.02 0.76  CALCIUM 9.1 8.9    Studies/Results: No results found.  Medications: I have reviewed the patient's current medications.  Assessment/Plan: Sepsis/ Stap Bacteremia- urine source- sacral decubiti- Continue Antibiotic- ID consult - noted- - TEE today to further evaluate SBE; 2 D echo- ok - Order PICC line- will need either 4 or 6 weeks- depend on TEE. Cardiology consult for TEE- schedule today  Blood culture from- 04/10/14 negative HTN- stable Sacral Decubiti- with abscess- s/p Surgical I &D- CT- - no osteomyelitis: wound care per surgery.  Hyperkalemia revolved in setting of sepsis and ARF- improved- - now with low K- replace  CHF- Chronic Diastolic HF- resume lasix  OSA/ Pulmonary HTN- O2- Echo - ok  RA: off MTX; continue Prednisone- chronic prednisone- 7.5 mg daily  A/C Renal failure - better  Nutrition: advance diet  Social: PT consult; will need SNF- rehab- family in agreement consult social work.- pt  with elderly husband- need wound care and IN antibiotic. Probable d/c tomorrow. To SNF   LOS: 4 days   Georgann Housekeeper 04/12/2014, 7:18 AM

## 2014-04-12 NOTE — Progress Notes (Signed)
Peripherally Inserted Central Catheter/Midline Placement  The IV Nurse has discussed with the patient and/or persons authorized to consent for the patient, the purpose of this procedure and the potential benefits and risks involved with this procedure.  The benefits include less needle sticks, lab draws from the catheter and patient may be discharged home with the catheter.  Risks include, but not limited to, infection, bleeding, blood clot (thrombus formation), and puncture of an artery; nerve damage and irregular heat beat.  Alternatives to this procedure were also discussed.  PICC/Midline Placement Documentation  PICC / Midline Single Lumen 04/12/14 PICC Right Basilic 44 cm 0 cm (Active)  Indication for Insertion or Continuance of Line Home intravenous therapies (PICC only) 04/12/2014  5:14 PM  Exposed Catheter (cm) 0 cm 04/12/2014  5:14 PM  Site Assessment Clean;Dry;Intact 04/12/2014  5:14 PM  Dressing Change Due 04/19/14 04/12/2014  5:14 PM       Deatra Robinson Poff 04/12/2014, 5:18 PM

## 2014-04-12 NOTE — Clinical Social Work Psychosocial (Signed)
Clinical Social Work Department BRIEF PSYCHOSOCIAL ASSESSMENT 04/12/2014  Patient:  Monica Blankenship, Monica Blankenship     Account Number:  [id number]     Admit date:  04/08/2014  Clinical Social Worker:  Delmer Islam  Date/Time:  04/12/2014 04:33 AM  Referred by:  Physician  Date Referred:  04/12/2014 Referred for  SNF Placement   Other Referral:   Interview type:  Patient Other interview type:   CSW talked with patient and family in room that included patient's husband, Monica Blankenship and daughter Monica Blankenship.    PSYCHOSOCIAL DATA Living Status:  HUSBAND Admitted from facility:   Level of care:   Primary support name:  Monica Blankenship Primary support relationship to patient:  CHILD, ADULT Degree of support available:   Husband is in a wheelchair and cannot provide direct care. Family concerned and wants rehab for patient before going home. Daughter's phone number: [phone].    CURRENT CONCERNS Current Concerns  Post-Acute Placement   Other Concerns:    SOCIAL WORK ASSESSMENT / PLAN CSW visited room and talked with patient and family. Daughter [name] did most of the talking for family. Family is interested in short-term rehab for patient. CSW explained process and provided family with list of facilities in [place]. Mr. Mccardle expressed that he does not want [facility] and daughter not interested in [facility] or [facility].   Assessment/plan status:  Psychosocial Support/Ongoing Assessment of Needs Other assessment/ plan:   Information/referral to community resources:   Family provided with SNF list for [facility]    PATIENT'S/FAMILY'S RESPONSE TO PLAN OF CARE: Daughter expecting and receptive to visit by CSW. Family in agreement with SNF and given list to assist in making decision. CSW contacted later same date and given SNF preference - [facility].

## 2014-04-12 NOTE — Progress Notes (Signed)
Physical Therapy Treatment Patient Details Name: Monica Blankenship MRN: [id number] DOB: 09/02/1929 Today's Date: 04/12/2014    History of Present Illness Pt is an 80 y/o female admitted with sepsis.     PT Comments    Pt is very motivated to move, and seems to want to please her caregivers; Today's walk however, pt shows unsteadiness, especially with unilateral assistive device (single point cane), which she is used to using; Plan to practice using RW next session -- pt is hesitant, but open to the idea  Am considering changing dc plan to SNF as pt does not have consistent, reliable hands-on assist at home, and will have wound care needs, and possibly IV antibiotic needs, as well as her gait and balance/rehab needs;  Discussed with pt, who says she'll think about it    Follow Up Recommendations  Supervision for mobility/OOB;SNF     Equipment Recommendations  Rolling walker with 5" wheels    Recommendations for Other Services       Precautions / Restrictions Precautions Precautions: Fall Restrictions Weight Bearing Restrictions: No    Mobility  Bed Mobility Overal bed mobility: Needs Assistance Bed Mobility: Supine to Sit     Supine to sit: Min assist     General bed mobility comments: handheld asssit to pull to sit at EOB  Transfers Overall transfer level: Needs assistance Equipment used: Straight cane Transfers: Sit to/from Stand Sit to Stand: Min assist         General transfer comment: Min assist for steadying balance and to power-up to full standing.   Ambulation/Gait Ambulation/Gait assistance: Min assist Ambulation Distance (Feet): 150 Feet Assistive device: Straight cane;1 person hand held assist Gait Pattern/deviations: Decreased stride length;Decreased stance time - right;Decreased stance time - left Gait velocity: Decreased   General Gait Details: VC's for sequencing and safety awareness with the SPC. initially walked only with Single point cane,  without handheld assist and gait speed was extremely slow; With bilateral UE support ( L handheld assist and R support from cane) pt gait was smoother; Pt motivated to ambulate increased distance.   Stairs            Wheelchair Mobility    Modified Rankin (Stroke Patients Only)       Balance Overall balance assessment: Needs assistance Sitting-balance support: Feet supported Sitting balance-Leahy Scale: Fair       Standing balance-Leahy Scale: Fair Standing balance comment: Pt is clearly more comfortable in standing with UE support                    Cognition Arousal/Alertness: Awake/alert Behavior During Therapy: WFL for tasks assessed/performed Overall Cognitive Status: Within Functional Limits for tasks assessed                      Exercises      General Comments        Pertinent Vitals/Pain no apparent distress     Home Living                      Prior Function            PT Goals (current goals can now be found in the care plan section) Acute Rehab PT Goals Patient Stated Goal: To return home PT Goal Formulation: With patient/family Time For Goal Achievement: 04/18/14 Potential to Achieve Goals: Good Progress towards PT goals: Progressing toward goals    Frequency  Min 3X/week  PT Plan Discharge plan needs to be updated    Co-evaluation             End of Session Equipment Utilized During Treatment: Gait belt Activity Tolerance: Patient tolerated treatment well Patient left: in chair;with chair alarm set;with call bell/phone within reach;with family/visitor present     Time: 9485-4627 PT Time Calculation (min): 25 min  Charges:  $Gait Training: 23-37 mins                    G Codes:      [facility] 04/12/2014, 4:12 PM  Van Clines, PT  Acute Rehabilitation Services Pager [phone] Office [phone]

## 2014-04-12 NOTE — Progress Notes (Signed)
Unable to schedule patient for TEE today. Will reschedule for tomorrow.  Discussed with our cardmaster Rosann Auerbach)  I have written for her to eat today. Will make her NPO after midnight tonight.  Vesta Mixer, Montez Hageman., MD, [facility] 04/12/2014, 8:31 AM Office - [phone] Pager [phone]

## 2014-04-12 NOTE — Progress Notes (Signed)
Patient ID: Monica Blankenship, female   DOB: Mar 26, 1929, 80 y.o.   MRN: [id number]   Subjective: Up with PT now.  Afebrile.  WBC normal.    Objective:  Vital signs:  Filed Vitals:   04/11/14 1300 04/11/14 2025 04/12/14 0527 04/12/14 0959  BP: 153/51 162/70 145/56 169/49  Pulse: 56 60 72 66  Temp:  98.3 F (36.8 C) 98.2 F (36.8 C) 98.4 F (36.9 C)  TempSrc:  Oral Oral Oral  Resp: 17 18 18 18   Height:      Weight:  190 lb 14.4 oz (86.592 kg)    SpO2: 100% 93% 96% 97%    Last BM Date: 04/11/14  Intake/Output   Yesterday:  05/10 0701 - 05/11 0700 In: 58 [P.O.:480; IV Piggyback:100] Out: 2 [Urine:2] This shift:  Total I/O In: -  Out: 1 [Urine:1]   Physical Exam: Skin: wound is clean, there is a small area medial to the wound with fibrinous exudate that may need to be debrided, dressing was    Problem List:   Active Problems:   Sepsis   Hyperkalemia   Severe sepsis(995.92)   Lactic acid acidosis   Encephalopathy acute   AKI (acute kidney injury)   Abscess, gluteal s/p I&D 04/09/2014   Delirium    Results:   Labs: Results for orders placed during the hospital encounter of 04/08/14 (from the past 48 hour(s))  CULTURE, BLOOD (ROUTINE X 2)     Status: None   Collection Time    04/10/14  3:30 PM      Result Value Ref Range   Specimen Description BLOOD RIGHT HAND     Special Requests BOTTLES DRAWN AEROBIC ONLY 3CC     Culture  Setup Time       Value: 04/10/2014 23:03     Performed at [facility]   Culture       Value:        BLOOD CULTURE RECEIVED NO GROWTH TO DATE CULTURE WILL BE HELD FOR 5 DAYS BEFORE ISSUING A FINAL NEGATIVE REPORT     Performed at [facility]   Report Status PENDING    CULTURE, BLOOD (ROUTINE X 2)     Status: None   Collection Time    04/10/14  3:40 PM      Result Value Ref Range   Specimen Description BLOOD RIGHT HAND     Special Requests BOTTLES DRAWN AEROBIC ONLY 5CC     Culture  Setup Time       Value:  04/10/2014 23:03     Performed at [facility]   Culture       Value:        BLOOD CULTURE RECEIVED NO GROWTH TO DATE CULTURE WILL BE HELD FOR 5 DAYS BEFORE ISSUING A FINAL NEGATIVE REPORT     Performed at [facility]   Report Status PENDING    CBC WITH DIFFERENTIAL     Status: Abnormal   Collection Time    04/11/14  5:30 AM      Result Value Ref Range   WBC 10.2  4.0 - 10.5 K/uL   RBC 3.43 (*) 3.87 - 5.11 MIL/uL   Hemoglobin 11.0 (*) 12.0 - 15.0 g/dL   HCT 33.6 (*) 36.0 - 46.0 %   MCV 98.0  78.0 - 100.0 fL   MCH 32.1  26.0 - 34.0 pg   MCHC 32.7  30.0 - 36.0 g/dL   RDW 17.8 (*) 11.5 -  15.5 %   Platelets 208  150 - 400 K/uL   Comment: DELTA CHECK NOTED     REPEATED TO VERIFY   Neutrophils Relative % 71  43 - 77 %   Neutro Abs 7.2  1.7 - 7.7 K/uL   Lymphocytes Relative 20  12 - 46 %   Lymphs Abs 2.0  0.7 - 4.0 K/uL   Monocytes Relative 8  3 - 12 %   Monocytes Absolute 0.8  0.1 - 1.0 K/uL   Eosinophils Relative 1  0 - 5 %   Eosinophils Absolute 0.1  0.0 - 0.7 K/uL   Basophils Relative 0  0 - 1 %   Basophils Absolute 0.0  0.0 - 0.1 K/uL  PHOSPHORUS     Status: None   Collection Time    04/11/14  5:30 AM      Result Value Ref Range   Phosphorus 2.9  2.3 - 4.6 mg/dL   Comment: HEMOLYSIS AT THIS LEVEL MAY AFFECT RESULT  MAGNESIUM     Status: None   Collection Time    04/11/14  5:30 AM      Result Value Ref Range   Magnesium 2.0  1.5 - 2.5 mg/dL  BASIC METABOLIC PANEL     Status: Abnormal   Collection Time    04/11/14  5:30 AM      Result Value Ref Range   Sodium 143  137 - 147 mEq/L   Potassium 4.5  3.7 - 5.3 mEq/L   Comment: HEMOLYSIS AT THIS LEVEL MAY AFFECT RESULT   Chloride 103  96 - 112 mEq/L   CO2 24  19 - 32 mEq/L   Glucose, Bld 86  70 - 99 mg/dL   BUN 13  6 - 23 mg/dL   Creatinine, Ser 0.76  0.50 - 1.10 mg/dL   Calcium 8.9  8.4 - 10.5 mg/dL   GFR calc non Af Amer 76 (*) >90 mL/min   GFR calc Af Amer 89 (*) >90 mL/min   Comment: (NOTE)      The eGFR has been calculated using the CKD EPI equation.     This calculation has not been validated in all clinical situations.     eGFR's persistently <90 mL/min signify possible Chronic Kidney     Disease.  CBC WITH DIFFERENTIAL     Status: Abnormal   Collection Time    04/12/14  5:45 AM      Result Value Ref Range   WBC 9.4  4.0 - 10.5 K/uL   RBC 3.66 (*) 3.87 - 5.11 MIL/uL   Hemoglobin 11.5 (*) 12.0 - 15.0 g/dL   HCT 36.5  36.0 - 46.0 %   MCV 99.7  78.0 - 100.0 fL   MCH 31.4  26.0 - 34.0 pg   MCHC 31.5  30.0 - 36.0 g/dL   RDW 17.9 (*) 11.5 - 15.5 %   Platelets 211  150 - 400 K/uL   Neutrophils Relative % 67  43 - 77 %   Neutro Abs 6.3  1.7 - 7.7 K/uL   Lymphocytes Relative 24  12 - 46 %   Lymphs Abs 2.3  0.7 - 4.0 K/uL   Monocytes Relative 7  3 - 12 %   Monocytes Absolute 0.6  0.1 - 1.0 K/uL   Eosinophils Relative 2  0 - 5 %   Eosinophils Absolute 0.2  0.0 - 0.7 K/uL   Basophils Relative 0  0 - 1 %   Basophils Absolute  0.0  0.0 - 0.1 K/uL  PHOSPHORUS     Status: None   Collection Time    04/12/14  5:45 AM      Result Value Ref Range   Phosphorus 3.0  2.3 - 4.6 mg/dL  MAGNESIUM     Status: None   Collection Time    04/12/14  5:45 AM      Result Value Ref Range   Magnesium 2.0  1.5 - 2.5 mg/dL  BASIC METABOLIC PANEL     Status: Abnormal   Collection Time    04/12/14  5:45 AM      Result Value Ref Range   Sodium 146  137 - 147 mEq/L   Potassium 3.2 (*) 3.7 - 5.3 mEq/L   Chloride 103  96 - 112 mEq/L   CO2 29  19 - 32 mEq/L   Glucose, Bld 82  70 - 99 mg/dL   BUN 11  6 - 23 mg/dL   Creatinine, Ser 0.86  0.50 - 1.10 mg/dL   Calcium 9.0  8.4 - 10.5 mg/dL   GFR calc non Af Amer 61 (*) >90 mL/min   GFR calc Af Amer 71 (*) >90 mL/min   Comment: (NOTE)     The eGFR has been calculated using the CKD EPI equation.     This calculation has not been validated in all clinical situations.     eGFR's persistently <90 mL/min signify possible Chronic Kidney     Disease.     Imaging / Studies: No results found.  Medications / Allergies: per chart  Antibiotics: Anti-infectives   Start     Dose/Rate Route Frequency Ordered Stop   04/10/14 1500  ceFAZolin (ANCEF) IVPB 1 g/50 mL premix     1 g 100 mL/hr over 30 Minutes Intravenous 3 times per day 04/10/14 1455     04/08/14 1800  clindamycin (CLEOCIN) IVPB 600 mg  Status:  Discontinued     600 mg 100 mL/hr over 30 Minutes Intravenous 4 times per day 04/08/14 1433 04/09/14 1047   04/08/14 1000  vancomycin (VANCOCIN) IVPB 1000 mg/200 mL premix  Status:  Discontinued     1,000 mg 200 mL/hr over 60 Minutes Intravenous Every 24 hours 04/08/14 0857 04/10/14 1420   04/08/14 0900  piperacillin-tazobactam (ZOSYN) IVPB 3.375 g  Status:  Discontinued     3.375 g 12.5 mL/hr over 240 Minutes Intravenous 3 times per day 04/08/14 0857 04/10/14 1420   04/08/14 0400  cefTRIAXone (ROCEPHIN) 1 g in dextrose 5 % 50 mL IVPB     1 g 100 mL/hr over 30 Minutes Intravenous  Once 04/08/14 0347 04/08/14 0515      Assessment/Plan S/p I&D gluteal abscess x2: continue with BID wet to dry dressing changes.   Erby Pian, [facility] Pager [phone] Office [phone]  04/12/2014 1:18 PM

## 2014-04-12 NOTE — Telephone Encounter (Signed)
To Dr Turner as a FYI 

## 2014-04-12 NOTE — Progress Notes (Signed)
INFECTIOUS DISEASE PROGRESS NOTE  ID: Monica Blankenship is a 80 y.o. female with  Active Problems:   Sepsis   Hyperkalemia   Severe sepsis(995.92)   Lactic acid acidosis   Encephalopathy acute   AKI (acute kidney injury)   Abscess, gluteal s/p I&D 04/09/2014   Delirium  Subjective: Resting quietly  Abtx:  Anti-infectives   Start     Dose/Rate Route Frequency Ordered Stop   04/10/14 1500  ceFAZolin (ANCEF) IVPB 1 g/50 mL premix     1 g 100 mL/hr over 30 Minutes Intravenous 3 times per day 04/10/14 1455     04/08/14 1800  clindamycin (CLEOCIN) IVPB 600 mg  Status:  Discontinued     600 mg 100 mL/hr over 30 Minutes Intravenous 4 times per day 04/08/14 1433 04/09/14 1047   04/08/14 1000  vancomycin (VANCOCIN) IVPB 1000 mg/200 mL premix  Status:  Discontinued     1,000 mg 200 mL/hr over 60 Minutes Intravenous Every 24 hours 04/08/14 0857 04/10/14 1420   04/08/14 0900  piperacillin-tazobactam (ZOSYN) IVPB 3.375 g  Status:  Discontinued     3.375 g 12.5 mL/hr over 240 Minutes Intravenous 3 times per day 04/08/14 0857 04/10/14 1420   04/08/14 0400  cefTRIAXone (ROCEPHIN) 1 g in dextrose 5 % 50 mL IVPB     1 g 100 mL/hr over 30 Minutes Intravenous  Once 04/08/14 0347 04/08/14 0515      Medications:  Scheduled: . acetaminophen  1,000 mg Oral TID  . calcium gluconate  1 g Intravenous Once  .  ceFAZolin (ANCEF) IV  1 g Intravenous 3 times per day  . collagenase   Topical Daily  . doxazosin  8 mg Oral QHS  . furosemide  40 mg Oral Daily  . heparin  5,000 Units Subcutaneous 3 times per day  . metoprolol tartrate  25 mg Oral BID  . potassium chloride  20 mEq Oral Daily  . predniSONE  7.5 mg Oral Q breakfast  . sodium chloride  1,000 mL Intravenous Once  . sodium chloride  2,000 mL Intravenous Once  . verapamil  240 mg Oral QHS    Objective: Vital signs in last 24 hours: Temp:  [98.2 F (36.8 C)-98.4 F (36.9 C)] 98.4 F (36.9 C) (05/11 0959) Pulse Rate:  [60-72] 66  (05/11 0959) Resp:  [18] 18 (05/11 0959) BP: (145-169)/(49-70) 169/49 mmHg (05/11 0959) SpO2:  [93 %-97 %] 97 % (05/11 0959) Weight:  [86.592 kg (190 lb 14.4 oz)] 86.592 kg (190 lb 14.4 oz) (05/10 2025)   General appearance: no distress Resp: clear to auscultation bilaterally Cardio: regular rate and rhythm GI: normal findings: bowel sounds normal and soft, non-tender  Lab Results  Recent Labs  04/11/14 0530 04/12/14 0545  WBC 10.2 9.4  HGB 11.0* 11.5*  HCT 33.6* 36.5  NA 143 146  K 4.5 3.2*  CL 103 103  CO2 24 29  BUN 13 11  CREATININE 0.76 0.86   Liver Panel  Recent Labs  04/10/14 0425  PROT 6.6  ALBUMIN 2.9*  AST 39*  ALT 58*  ALKPHOS 68  BILITOT 0.8   Sedimentation Rate No results found for this basename: ESRSEDRATE,  in the last 72 hours C-Reactive Protein No results found for this basename: CRP,  in the last 72 hours  Microbiology: Recent Results (from the past 240 hour(s))  CULTURE, BLOOD (ROUTINE X 2)     Status: None   Collection Time    04/08/14  2:39 AM      Result Value Ref Range Status   Specimen Description BLOOD LEFT FOREARM   Final   Special Requests BOTTLES DRAWN AEROBIC ONLY [facility]   Final   Culture  Setup Time     Final   Value: 04/08/2014 09:01     Performed at [facility]   Culture     Final   Value: STAPHYLOCOCCUS AUREUS     Note: RIFAMPIN AND GENTAMICIN SHOULD NOT BE USED AS SINGLE DRUGS FOR TREATMENT OF STAPH INFECTIONS. This organism DOES NOT demonstrate inducible Clindamycin resistance in vitro.     Note: Gram Stain Report Called to,Read Back By and Verified With: ALICA GREEN ON 04/08/2014 AT 9:58P BY WILEJ     Performed at [facility]   Report Status 04/10/2014 FINAL   Final   Organism ID, Bacteria STAPHYLOCOCCUS AUREUS   Final  CULTURE, BLOOD (ROUTINE X 2)     Status: None   Collection Time    04/08/14  2:46 AM      Result Value Ref Range Status   Specimen Description BLOOD LEFT ANTECUBITAL   Final   Special  Requests BOTTLES DRAWN AEROBIC AND ANAEROBIC 8CC   Final   Culture  Setup Time     Final   Value: 04/08/2014 09:01     Performed at [facility]   Culture     Final   Value: STAPHYLOCOCCUS AUREUS     Note: SUSCEPTIBILITIES PERFORMED ON PREVIOUS CULTURE WITHIN THE LAST 5 DAYS.     Note: Gram Stain Report Called to,Read Back By and Verified With: ALICIA GREEN ON 04/08/2014 AT 9:58P BY WILEJ     Performed at [facility]   Report Status 04/10/2014 FINAL   Final  URINE CULTURE     Status: None   Collection Time    04/08/14  3:20 AM      Result Value Ref Range Status   Specimen Description URINE, CATHETERIZED   Final   Special Requests NONE   Final   Culture  Setup Time     Final   Value: 04/08/2014 09:05     Performed at [facility] Count     Final   Value: >=100,000 COLONIES/ML     Performed at [facility]   Culture     Final   Value: STAPHYLOCOCCUS AUREUS     Note: RIFAMPIN AND GENTAMICIN SHOULD NOT BE USED AS SINGLE DRUGS FOR TREATMENT OF STAPH INFECTIONS.     Performed at [facility]   Report Status 04/10/2014 FINAL   Final   Organism ID, Bacteria STAPHYLOCOCCUS AUREUS   Final  MRSA PCR SCREENING     Status: None   Collection Time    04/08/14  7:00 AM      Result Value Ref Range Status   MRSA by PCR NEGATIVE  NEGATIVE Final   Comment:            The GeneXpert MRSA Assay (FDA     approved for NASAL specimens     only), is one component of a     comprehensive MRSA colonization     surveillance program. It is not     intended to diagnose MRSA     infection nor to guide or     monitor treatment for     MRSA infections.  MRSA PCR SCREENING     Status: None   Collection Time  04/08/14  8:39 AM      Result Value Ref Range Status   MRSA by PCR NEGATIVE  NEGATIVE Final   Comment:            The GeneXpert MRSA Assay (FDA     approved for NASAL specimens     only), is one component of a     comprehensive MRSA colonization      surveillance program. It is not     intended to diagnose MRSA     infection nor to guide or     monitor treatment for     MRSA infections.  WOUND CULTURE     Status: None   Collection Time    04/09/14  4:19 PM      Result Value Ref Range Status   Specimen Description WOUND RIGHT BUTTOCKS   Final   Special Requests NONE   Final   Gram Stain     Final   Value: RARE WBC PRESENT,BOTH PMN AND MONONUCLEAR     NO SQUAMOUS EPITHELIAL CELLS SEEN     NO ORGANISMS SEEN     Performed at [facility]   Culture     Final   Value: MODERATE STAPHYLOCOCCUS AUREUS     Note: RIFAMPIN AND GENTAMICIN SHOULD NOT BE USED AS SINGLE DRUGS FOR TREATMENT OF STAPH INFECTIONS.     Performed at [facility]   Report Status PENDING   Incomplete  CULTURE, BLOOD (ROUTINE X 2)     Status: None   Collection Time    04/10/14  3:30 PM      Result Value Ref Range Status   Specimen Description BLOOD RIGHT HAND   Final   Special Requests BOTTLES DRAWN AEROBIC ONLY 3[facility]   Final   Culture  Setup Time     Final   Value: 04/10/2014 23:03     Performed at [facility]   Culture     Final   Value:        BLOOD CULTURE RECEIVED NO GROWTH TO DATE CULTURE WILL BE HELD FOR 5 DAYS BEFORE ISSUING A FINAL NEGATIVE REPORT     Performed at [facility]   Report Status PENDING   Incomplete  CULTURE, BLOOD (ROUTINE X 2)     Status: None   Collection Time    04/10/14  3:40 PM      Result Value Ref Range Status   Specimen Description BLOOD RIGHT HAND   Final   Special Requests BOTTLES DRAWN AEROBIC ONLY 5CC   Final   Culture  Setup Time     Final   Value: 04/10/2014 23:03     Performed at [facility]   Culture     Final   Value:        BLOOD CULTURE RECEIVED NO GROWTH TO DATE CULTURE WILL BE HELD FOR 5 DAYS BEFORE ISSUING A FINAL NEGATIVE REPORT     Performed at [facility]   Report Status PENDING   Incomplete    Studies/Results: No results found.   Assessment/Plan: MSSA  bacteremia Sacral decubitus, gluteal abscesses (MSSA) UCx MSSA Pulmonary htn RA on prednisone  Total days of antibiotics: 5 (ancef)  Await repeat BCx Await TEE Would increase dose of ancef to 2 g q8h         Ginnie Smart Infectious Diseases (pager) [phone] [email or web address] 04/12/2014, 1:59 PM  LOS: 4 days   **Disclaimer: This note may have been dictated with voice  recognition software. Similar sounding words can inadvertently be transcribed and this note may contain transcription errors which may not have been corrected upon publication of note.**

## 2014-04-12 NOTE — Telephone Encounter (Signed)
New message     FYI Mom is in the hosp---been there for 4 days at [facility].  Last thurs admitted because potassium to high----having TEE somethimes today.

## 2014-04-12 NOTE — Progress Notes (Signed)
ANTIBIOTIC CONSULT NOTE - FOLLOW UP  Pharmacy Consult for Cefazolin Indication: MSSA bacteremia  Allergies  Allergen Reactions  . Fentanyl Other (See Comments)    confusion    Patient Measurements: Height: 5\' 2"  (157.5 cm) Weight: 190 lb 14.4 oz (86.592 kg) IBW/kg (Calculated) : 50.1 Adjusted Body Weight:   Vital Signs: Temp: 98.4 F (36.9 C) (05/11 0959) Temp src: Oral (05/11 0959) BP: 169/49 mmHg (05/11 0959) Pulse Rate: 66 (05/11 0959) Intake/Output from previous day: 05/10 0701 - 05/11 0700 In: 580 [P.O.:480; IV Piggyback:100] Out: 2 [Urine:2] Intake/Output from this shift: Total I/O In: -  Out: 1 [Urine:1]  Labs:  Recent Labs  04/10/14 0425 04/11/14 0530 04/12/14 0545  WBC 13.6* 10.2 9.4  HGB 10.6* 11.0* 11.5*  PLT 154 208 211  CREATININE 1.02 0.76 0.86   Estimated Creatinine Clearance: 51.5 ml/min (by C-G formula based on Cr of 0.86). No results found for this basename: VANCOTROUGH, 06/12/14, VANCORANDOM, GENTTROUGH, GENTPEAK, GENTRANDOM, TOBRATROUGH, TOBRAPEAK, TOBRARND, AMIKACINPEAK, AMIKACINTROU, AMIKACIN,  in the last 72 hours   Microbiology: Recent Results (from the past 720 hour(s))  CULTURE, BLOOD (ROUTINE X 2)     Status: None   Collection Time    04/08/14  2:39 AM      Result Value Ref Range Status   Specimen Description BLOOD LEFT FOREARM   Final   Special Requests BOTTLES DRAWN AEROBIC ONLY [facility]   Final   Culture  Setup Time     Final   Value: 04/08/2014 09:01     Performed at 06/08/2014   Culture     Final   Value: STAPHYLOCOCCUS AUREUS     Note: RIFAMPIN AND GENTAMICIN SHOULD NOT BE USED AS SINGLE DRUGS FOR TREATMENT OF STAPH INFECTIONS. This organism DOES NOT demonstrate inducible Clindamycin resistance in vitro.     Note: Gram Stain Report Called to,Read Back By and Verified With: ALICA GREEN ON 04/08/2014 AT 9:58P BY WILEJ     Performed at 06/08/2014   Report Status 04/10/2014 FINAL   Final   Organism ID, Bacteria  STAPHYLOCOCCUS AUREUS   Final  CULTURE, BLOOD (ROUTINE X 2)     Status: None   Collection Time    04/08/14  2:46 AM      Result Value Ref Range Status   Specimen Description BLOOD LEFT ANTECUBITAL   Final   Special Requests BOTTLES DRAWN AEROBIC AND ANAEROBIC 8CC   Final   Culture  Setup Time     Final   Value: 04/08/2014 09:01     Performed at 06/08/2014   Culture     Final   Value: STAPHYLOCOCCUS AUREUS     Note: SUSCEPTIBILITIES PERFORMED ON PREVIOUS CULTURE WITHIN THE LAST 5 DAYS.     Note: Gram Stain Report Called to,Read Back By and Verified With: ALICIA GREEN ON 04/08/2014 AT 9:58P BY WILEJ     Performed at 06/08/2014   Report Status 04/10/2014 FINAL   Final  URINE CULTURE     Status: None   Collection Time    04/08/14  3:20 AM      Result Value Ref Range Status   Specimen Description URINE, CATHETERIZED   Final   Special Requests NONE   Final   Culture  Setup Time     Final   Value: 04/08/2014 09:05     Performed at 06/08/2014 Count     Final   Value: >=100,000 COLONIES/ML  Performed at [facility]     Final   Value: STAPHYLOCOCCUS AUREUS     Note: RIFAMPIN AND GENTAMICIN SHOULD NOT BE USED AS SINGLE DRUGS FOR TREATMENT OF STAPH INFECTIONS.     Performed at [facility]   Report Status 04/10/2014 FINAL   Final   Organism ID, Bacteria STAPHYLOCOCCUS AUREUS   Final  MRSA PCR SCREENING     Status: None   Collection Time    04/08/14  7:00 AM      Result Value Ref Range Status   MRSA by PCR NEGATIVE  NEGATIVE Final   Comment:            The GeneXpert MRSA Assay (FDA     approved for NASAL specimens     only), is one component of a     comprehensive MRSA colonization     surveillance program. It is not     intended to diagnose MRSA     infection nor to guide or     monitor treatment for     MRSA infections.  MRSA PCR SCREENING     Status: None   Collection Time    04/08/14  8:39 AM      Result  Value Ref Range Status   MRSA by PCR NEGATIVE  NEGATIVE Final   Comment:            The GeneXpert MRSA Assay (FDA     approved for NASAL specimens     only), is one component of a     comprehensive MRSA colonization     surveillance program. It is not     intended to diagnose MRSA     infection nor to guide or     monitor treatment for     MRSA infections.  WOUND CULTURE     Status: None   Collection Time    04/09/14  4:19 PM      Result Value Ref Range Status   Specimen Description WOUND RIGHT BUTTOCKS   Final   Special Requests NONE   Final   Gram Stain     Final   Value: RARE WBC PRESENT,BOTH PMN AND MONONUCLEAR     NO SQUAMOUS EPITHELIAL CELLS SEEN     NO ORGANISMS SEEN     Performed at [facility]   Culture     Final   Value: MODERATE STAPHYLOCOCCUS AUREUS     Note: RIFAMPIN AND GENTAMICIN SHOULD NOT BE USED AS SINGLE DRUGS FOR TREATMENT OF STAPH INFECTIONS.     Performed at [facility]   Report Status PENDING   Incomplete  CULTURE, BLOOD (ROUTINE X 2)     Status: None   Collection Time    04/10/14  3:30 PM      Result Value Ref Range Status   Specimen Description BLOOD RIGHT HAND   Final   Special Requests BOTTLES DRAWN AEROBIC ONLY 3[facility]   Final   Culture  Setup Time     Final   Value: 04/10/2014 23:03     Performed at [facility]   Culture     Final   Value:        BLOOD CULTURE RECEIVED NO GROWTH TO DATE CULTURE WILL BE HELD FOR 5 DAYS BEFORE ISSUING A FINAL NEGATIVE REPORT     Performed at [facility]   Report Status PENDING   Incomplete  CULTURE, BLOOD (ROUTINE X 2)  Status: None   Collection Time    04/10/14  3:40 PM      Result Value Ref Range Status   Specimen Description BLOOD RIGHT HAND   Final   Special Requests BOTTLES DRAWN AEROBIC ONLY 5CC   Final   Culture  Setup Time     Final   Value: 04/10/2014 23:03     Performed at [facility]   Culture     Final   Value:        BLOOD CULTURE RECEIVED NO GROWTH  TO DATE CULTURE WILL BE HELD FOR 5 DAYS BEFORE ISSUING A FINAL NEGATIVE REPORT     Performed at [facility]   Report Status PENDING   Incomplete    Anti-infectives   Start     Dose/Rate Route Frequency Ordered Stop   04/10/14 1500  ceFAZolin (ANCEF) IVPB 1 g/50 mL premix     1 g 100 mL/hr over 30 Minutes Intravenous 3 times per day 04/10/14 1455     04/08/14 1800  clindamycin (CLEOCIN) IVPB 600 mg  Status:  Discontinued     600 mg 100 mL/hr over 30 Minutes Intravenous 4 times per day 04/08/14 1433 04/09/14 1047   04/08/14 1000  vancomycin (VANCOCIN) IVPB 1000 mg/200 mL premix  Status:  Discontinued     1,000 mg 200 mL/hr over 60 Minutes Intravenous Every 24 hours 04/08/14 0857 04/10/14 1420   04/08/14 0900  piperacillin-tazobactam (ZOSYN) IVPB 3.375 g  Status:  Discontinued     3.375 g 12.5 mL/hr over 240 Minutes Intravenous 3 times per day 04/08/14 0857 04/10/14 1420   04/08/14 0400  cefTRIAXone (ROCEPHIN) 1 g in dextrose 5 % 50 mL IVPB     1 g 100 mL/hr over 30 Minutes Intravenous  Once 04/08/14 0347 04/08/14 0515      Assessment: 80yo female with MSSA bacteremia.  WBC wnl and Cr < 1.  Current dose is appropriate for renal function.  Goal of Therapy:  resolution of infection  Plan:  1-  Continue Cefazolin 1 gm IV q8 2-  Pharmacy will sign-off, please reconsult as needed.  Marisue Humble, PharmD Clinical Pharmacist [facility]

## 2014-04-13 ENCOUNTER — Encounter (HOSPITAL_COMMUNITY)
Admission: EM | Disposition: A | Discharge status: Skilled Nursing Facility | Payer: Self-pay | Source: Home / Self Care | Attending: Internal Medicine

## 2014-04-13 ENCOUNTER — Encounter (HOSPITAL_COMMUNITY): Payer: Self-pay | Admitting: *Deleted

## 2014-04-13 DIAGNOSIS — L0231 Cutaneous abscess of buttock: Secondary | ICD-10-CM

## 2014-04-13 DIAGNOSIS — L03317 Cellulitis of buttock: Secondary | ICD-10-CM

## 2014-04-13 DIAGNOSIS — A419 Sepsis, unspecified organism: Secondary | ICD-10-CM | POA: Diagnosis not present

## 2014-04-13 DIAGNOSIS — R7881 Bacteremia: Secondary | ICD-10-CM

## 2014-04-13 DIAGNOSIS — I330 Acute and subacute infective endocarditis: Secondary | ICD-10-CM

## 2014-04-13 DIAGNOSIS — A412 Sepsis due to unspecified staphylococcus: Secondary | ICD-10-CM | POA: Diagnosis not present

## 2014-04-13 HISTORY — PX: TEE WITHOUT CARDIOVERSION: SHX5443

## 2014-04-13 LAB — WOUND CULTURE

## 2014-04-13 SURGERY — ECHOCARDIOGRAM, TRANSESOPHAGEAL
Anesthesia: Moderate Sedation

## 2014-04-13 MED ORDER — LIDOCAINE-EPINEPHRINE 1 %-1:100000 IJ SOLN
20.0000 mL | Freq: Once | INTRAMUSCULAR | Status: DC
  Filled 2014-04-13: qty 20

## 2014-04-13 MED ORDER — DIPHENHYDRAMINE HCL 50 MG/ML IJ SOLN
INTRAMUSCULAR | Status: AC
  Filled 2014-04-13: qty 1

## 2014-04-13 MED ORDER — MIDAZOLAM HCL 5 MG/ML IJ SOLN
INTRAMUSCULAR | Status: AC
  Filled 2014-04-13: qty 2

## 2014-04-13 MED ORDER — FENTANYL CITRATE 0.05 MG/ML IJ SOLN
INTRAMUSCULAR | Status: AC
  Filled 2014-04-13: qty 2

## 2014-04-13 MED ORDER — MIDAZOLAM HCL 10 MG/2ML IJ SOLN
INTRAMUSCULAR | Status: DC | PRN
  Administered 2014-04-13 (×2): 2 mg via INTRAVENOUS

## 2014-04-13 MED ORDER — POTASSIUM CHLORIDE CRYS ER 20 MEQ PO TBCR
20.0000 meq | EXTENDED_RELEASE_TABLET | Freq: Once | ORAL | Status: AC
  Administered 2014-04-13: 20 meq via ORAL

## 2014-04-13 MED ORDER — ENSURE PUDDING PO PUDG: 1.0000 | Freq: Three times a day (TID) | ORAL | Status: DC

## 2014-04-13 MED ORDER — BUTAMBEN-TETRACAINE-BENZOCAINE 2-2-14 % EX AERO
INHALATION_SPRAY | CUTANEOUS | Status: DC | PRN
  Administered 2014-04-13: 2 via TOPICAL

## 2014-04-13 MED ORDER — CEFAZOLIN SODIUM-DEXTROSE 2-3 GM-% IV SOLR: 2.0000 g | Freq: Three times a day (TID) | INTRAVENOUS | Status: DC

## 2014-04-13 MED ORDER — ENSURE PUDDING PO PUDG
1.0000 | Freq: Three times a day (TID) | ORAL | Status: DC
  Administered 2014-04-13 – 2014-04-14 (×2): 1 via ORAL

## 2014-04-13 MED ORDER — POTASSIUM CHLORIDE CRYS ER 20 MEQ PO TBCR
20.0000 meq | EXTENDED_RELEASE_TABLET | Freq: Two times a day (BID) | ORAL | Status: DC
  Administered 2014-04-13 – 2014-04-14 (×3): 20 meq via ORAL
  Filled 2014-04-13 (×3): qty 1

## 2014-04-13 NOTE — Interval H&P Note (Signed)
History and Physical Interval Note:  04/13/2014 11:28 AM  Monica Blankenship  has presented today for surgery, with the diagnosis of r/o bacteremia  The various methods of treatment have been discussed with the patient and family. After consideration of risks, benefits and other options for treatment, the patient has consented to  Procedure(s): TRANSESOPHAGEAL ECHOCARDIOGRAM (TEE) (N/A) as a surgical intervention .  The patient's history has been reviewed, patient examined, no change in status, stable for surgery.  I have reviewed the patient's chart and labs.  Questions were answered to the patient's satisfaction.     Deloris Ping Nahser

## 2014-04-13 NOTE — Discharge Instructions (Signed)
WOUND CARE  It is important that the wound be kept open.   -Keeping the skin edges apart will allow the wound to gradually heal from the base upwards.   - If the skin edges of the wound close too early, a new fluid pocket can form and infection can occur. -This is the reason to pack deeper wounds with gauze or ribbon -This is why drained wounds cannot be sewed closed right away  A healthy wound should form a lining of bright red "beefy" granulating tissue that will help shrink the wound and help the edges grow new skin into it.   -A little mucus / yellow discharge is normal (the body's natural way to try and form a scab) and should be gently washed off with soap and water with daily dressing changes.  -Green or foul smelling drainage implies bacterial colonization and can slow wound healing - a short course of antibiotic ointment (3-5 days) can help it clear up.  Call the doctor if it does not improve or worsens  -Avoid use of antibiotic ointments for more than a week as they can slow wound healing over time.    -Sometimes other wound care products will be used to reduce need for dressing changes and/or help clean up dirty wounds -Sometimes the surgeon needs to debride the wound in the office to remove dead or infected tissue out of the wound so it can heal more quickly and safely.    Change the dressing at least once a day -Wash the wound with mild soap and water gently every day.  It is good to shower or bathe the wound to help it clean out. -Use clean 4x4 gauze for medium/large wounds or ribbon plain NU-gauze for smaller wounds (it does not need to be sterile, just clean) -Keep the raw wound moist with a little saline or KY (saline) gel on the gauze.  -A dry wound will take longer to heal.  -Keep the skin dry around the wound to prevent breakdown and irritation. -Pack the wound down to the base -The goal is to keep the skin apart, not overpack the wound -Use a Q-tip or blunt-tipped  kabob stick toothpick to push the gauze down to the base in narrow or deep wounds   -Cover with a clean gauze and tape -paper or Medipore tape tend to be gentle on the skin -rotate the orientation of the tape to avoid repeated stress/trauma on the skin -using an ACE or Coban wrap on wounds on arms or legs can be used instead.  Complete all antibiotics through the entire prescription to help the infection heal and prevent new places of infection   Returning the see the surgeon is helpful to follow the healing process and help the wound close as fast as possible.  Abscess An abscess is an infected area that contains a collection of pus and debris.It can occur in almost any part of the body. An abscess is also known as a furuncle or boil. CAUSES  An abscess occurs when tissue gets infected. This can occur from blockage of oil or sweat glands, infection of hair follicles, or a minor injury to the skin. As the body tries to fight the infection, pus collects in the area and creates pressure under the skin. This pressure causes pain. People with weakened immune systems have difficulty fighting infections and get certain abscesses more often.  SYMPTOMS Usually an abscess develops on the skin and becomes a painful mass that is red,  warm, and tender. If the abscess forms under the skin, you may feel a moveable soft area under the skin. Some abscesses break open (rupture) on their own, but most will continue to get worse without care. The infection can spread deeper into the body and eventually into the bloodstream, causing you to feel ill.  DIAGNOSIS  Your caregiver will take your medical history and perform a physical exam. A sample of fluid may also be taken from the abscess to determine what is causing your infection. TREATMENT  Your caregiver may prescribe antibiotic medicines to fight the infection. However, taking antibiotics alone usually does not cure an abscess. Your caregiver may need to make a  small cut (incision) in the abscess to drain the pus. In some cases, gauze is packed into the abscess to reduce pain and to continue draining the area. HOME CARE INSTRUCTIONS   Only take over-the-counter or prescription medicines for pain, discomfort, or fever as directed by your caregiver.  If you were prescribed antibiotics, take them as directed. Finish them even if you start to feel better.  If gauze is used, follow your caregiver's directions for changing the gauze.  To avoid spreading the infection:  Keep your draining abscess covered with a bandage.  Wash your hands well.  Do not share personal care items, towels, or whirlpools with others.  Avoid skin contact with others.  Keep your skin and clothes clean around the abscess.  Keep all follow-up appointments as directed by your caregiver. SEEK MEDICAL CARE IF:   You have increased pain, swelling, redness, fluid drainage, or bleeding.  You have muscle aches, chills, or a general ill feeling.  You have a fever. MAKE SURE YOU:   Understand these instructions.  Will watch your condition.  Will get help right away if you are not doing well or get worse. Document Released: 08/29/2005 Document Revised: 05/20/2012 Document Reviewed: 02/01/2012 [facility] Patient Information 2014 [place], [place].

## 2014-04-13 NOTE — Progress Notes (Signed)
Patient ID: Monica Blankenship, female   DOB: 06-20-1929, 80 y.o.   MRN: [id number]  Subjective: No changes.  Objective:  Vital signs:  Filed Vitals:   04/12/14 1808 04/12/14 2100 04/12/14 2103 04/13/14 0403  BP: 176/71 164/73 164/73 137/50  Pulse: 74 71 71 67  Temp: 98.3 F (36.8 C) 98.8 F (37.1 C)  98.6 F (37 C)  TempSrc: Oral Oral  Oral  Resp: 18 17  18   Height:      Weight:  177 lb 1.6 oz (80.332 kg)    SpO2: 95% 98%  100%    Last BM Date: 04/12/14  Intake/Output   Yesterday:  05/11 0701 - 05/12 0700 In: 480 [P.O.:480] Out: 652 [Urine:652] This shift:    I/O last 3 completed shifts: In: 57 [P.O.:480; IV Piggyback:100] Out: 652 [Urine:652]    Physical Exam: Skin: wound is clean, small area medial to wound is necrotic.  No purulent drainage.  Tinea to groin.   Problem List:   Active Problems:   Sepsis   Hyperkalemia   Severe sepsis(995.92)   Lactic acid acidosis   Encephalopathy acute   AKI (acute kidney injury)   Abscess, gluteal s/p I&D 04/09/2014   Delirium    Results:   Labs: Results for orders placed during the hospital encounter of 04/08/14 (from the past 48 hour(s))  CBC WITH DIFFERENTIAL     Status: Abnormal   Collection Time    04/12/14  5:45 AM      Result Value Ref Range   WBC 9.4  4.0 - 10.5 K/uL   RBC 3.66 (*) 3.87 - 5.11 MIL/uL   Hemoglobin 11.5 (*) 12.0 - 15.0 g/dL   HCT 36.5  36.0 - 46.0 %   MCV 99.7  78.0 - 100.0 fL   MCH 31.4  26.0 - 34.0 pg   MCHC 31.5  30.0 - 36.0 g/dL   RDW 17.9 (*) 11.5 - 15.5 %   Platelets 211  150 - 400 K/uL   Neutrophils Relative % 67  43 - 77 %   Neutro Abs 6.3  1.7 - 7.7 K/uL   Lymphocytes Relative 24  12 - 46 %   Lymphs Abs 2.3  0.7 - 4.0 K/uL   Monocytes Relative 7  3 - 12 %   Monocytes Absolute 0.6  0.1 - 1.0 K/uL   Eosinophils Relative 2  0 - 5 %   Eosinophils Absolute 0.2  0.0 - 0.7 K/uL   Basophils Relative 0  0 - 1 %   Basophils Absolute 0.0  0.0 - 0.1 K/uL  PHOSPHORUS     Status:  None   Collection Time    04/12/14  5:45 AM      Result Value Ref Range   Phosphorus 3.0  2.3 - 4.6 mg/dL  MAGNESIUM     Status: None   Collection Time    04/12/14  5:45 AM      Result Value Ref Range   Magnesium 2.0  1.5 - 2.5 mg/dL  BASIC METABOLIC PANEL     Status: Abnormal   Collection Time    04/12/14  5:45 AM      Result Value Ref Range   Sodium 146  137 - 147 mEq/L   Potassium 3.2 (*) 3.7 - 5.3 mEq/L   Chloride 103  96 - 112 mEq/L   CO2 29  19 - 32 mEq/L   Glucose, Bld 82  70 - 99 mg/dL   BUN 11  6 -  23 mg/dL   Creatinine, Ser 0.86  0.50 - 1.10 mg/dL   Calcium 9.0  8.4 - 10.5 mg/dL   GFR calc non Af Amer 61 (*) >90 mL/min   GFR calc Af Amer 71 (*) >90 mL/min   Comment: (NOTE)     The eGFR has been calculated using the CKD EPI equation.     This calculation has not been validated in all clinical situations.     eGFR's persistently <90 mL/min signify possible Chronic Kidney     Disease.    Imaging / Studies: No results found.  Medications / Allergies: per chart  Antibiotics: Anti-infectives   Start     Dose/Rate Route Frequency Ordered Stop   04/13/14 0000  ceFAZolin (ANCEF) 2-3 GM-% SOLR     2 g 100 mL/hr over 30 Minutes Intravenous Every 8 hours 04/13/14 0821     04/12/14 1500  [MAR Hold]  ceFAZolin (ANCEF) IVPB 2 g/50 mL premix     (On MAR Hold since 04/13/14 0923)   2 g 100 mL/hr over 30 Minutes Intravenous 3 times per day 04/12/14 1412     04/10/14 1500  ceFAZolin (ANCEF) IVPB 1 g/50 mL premix  Status:  Discontinued     1 g 100 mL/hr over 30 Minutes Intravenous 3 times per day 04/10/14 1455 04/12/14 1412   04/08/14 1800  clindamycin (CLEOCIN) IVPB 600 mg  Status:  Discontinued     600 mg 100 mL/hr over 30 Minutes Intravenous 4 times per day 04/08/14 1433 04/09/14 1047   04/08/14 1000  vancomycin (VANCOCIN) IVPB 1000 mg/200 mL premix  Status:  Discontinued     1,000 mg 200 mL/hr over 60 Minutes Intravenous Every 24 hours 04/08/14 0857 04/10/14 1420    04/08/14 0900  piperacillin-tazobactam (ZOSYN) IVPB 3.375 g  Status:  Discontinued     3.375 g 12.5 mL/hr over 240 Minutes Intravenous 3 times per day 04/08/14 0857 04/10/14 1420   04/08/14 0400  cefTRIAXone (ROCEPHIN) 1 g in dextrose 5 % 50 mL IVPB     1 g 100 mL/hr over 30 Minutes Intravenous  Once 04/08/14 0347 04/08/14 0515      Assessment/Plan S/p I&D gluteal abscess x2:  -will proceed with a bedside debridement of the small area or necrosis after her TEE today.  The patient is agreeable.  i also spoke with her daughter at bedside. -she may still be discharged today after the procedure per primary team. -BID wet to dry dressing changes -follow up in clinic in 2 weeks for a wound check Tinea cruris -recommend treatment   Erby Pian, [facility] Surgery Pager [phone] Office [phone]  04/13/2014 9:34 AM

## 2014-04-13 NOTE — Progress Notes (Signed)
Subjective: Pt denies SOB Awaiting TEE today.  Objective: Vital signs in last 24 hours: Temp:  [98.1 F (36.7 C)-98.8 F (37.1 C)] 98.6 F (37 C) (05/12 0403) Pulse Rate:  [66-74] 67 (05/12 0403) Resp:  [17-18] 18 (05/12 0403) BP: (137-176)/(49-73) 137/50 mmHg (05/12 0403) SpO2:  [95 %-100 %] 100 % (05/12 0403) Weight:  [80.332 kg (177 lb 1.6 oz)] 80.332 kg (177 lb 1.6 oz) (05/11 2100) Weight change: -6.26 kg (-13 lb 12.8 oz) Last BM Date: 04/12/14  Intake/Output from previous day: 05/11 0701 - 05/12 0700 In: 480 [P.O.:480] Out: 652 [Urine:652] Intake/Output this shift:    General appearance: alert Resp: clear to auscultation bilaterally Cardio: regular rate and rhythm GI: soft, non-tender; bowel sounds normal; no masses,  no organomegaly  Lab Results:  Recent Labs  04/11/14 0530 04/12/14 0545  WBC 10.2 9.4  HGB 11.0* 11.5*  HCT 33.6* 36.5  PLT 208 211   BMET  Recent Labs  04/11/14 0530 04/12/14 0545  NA 143 146  K 4.5 3.2*  CL 103 103  CO2 24 29  GLUCOSE 86 82  BUN 13 11  CREATININE 0.76 0.86  CALCIUM 8.9 9.0    Studies/Results: No results found.  Medications: I have reviewed the patient's current medications.  Assessment/Plan: Sepsis/ Stap Bacteremia- urine source- sacral decubiti- Continue Antibiotic- ID consult - noted- - TEE today to further evaluate SBE; 2 D echo- ok -  PICC line- done will need either 4 or 6 weeks- depend on TEE.  Cardiology consult for TEE- schedule today  Blood culture from- 04/10/14 negative  HTN- stable  Sacral Decubiti- with abscess- s/p Surgical I &D- CT- - no osteomyelitis: wound care per surgery.  Hyperkalemia revolved in setting of sepsis and ARF- improved- - now with low K- replace  CHF- Chronic Diastolic HF- resume lasix  OSA/ Pulmonary HTN- O2- Echo - ok  RA: off MTX; continue Prednisone- chronic prednisone- 7.5 mg daily  A/C Renal failure - better  Nutrition: advance diet  Social: PT consult; will need SNF-  rehab- family in agreement consult social work.- pt with elderly husband- need wound care and IN antibiotic.  Probable d/c to SNF  Later today or tomorrow- family want either [facility] or SNF in [place].     LOS: 5 days   Georgann Housekeeper 04/13/2014, 7:29 AM

## 2014-04-13 NOTE — H&P (View-Only) (Signed)
Subjective: Pt denies SOB Awaiting TEE today.  Objective: Vital signs in last 24 hours: Temp:  [98.1 F (36.7 C)-98.8 F (37.1 C)] 98.6 F (37 C) (05/12 0403) Pulse Rate:  [66-74] 67 (05/12 0403) Resp:  [17-18] 18 (05/12 0403) BP: (137-176)/(49-73) 137/50 mmHg (05/12 0403) SpO2:  [95 %-100 %] 100 % (05/12 0403) Weight:  [80.332 kg (177 lb 1.6 oz)] 80.332 kg (177 lb 1.6 oz) (05/11 2100) Weight change: -6.26 kg (-13 lb 12.8 oz) Last BM Date: 04/12/14  Intake/Output from previous day: 05/11 0701 - 05/12 0700 In: 480 [P.O.:480] Out: 652 [Urine:652] Intake/Output this shift:    General appearance: alert Resp: clear to auscultation bilaterally Cardio: regular rate and rhythm GI: soft, non-tender; bowel sounds normal; no masses,  no organomegaly  Lab Results:  Recent Labs  04/11/14 0530 04/12/14 0545  WBC 10.2 9.4  HGB 11.0* 11.5*  HCT 33.6* 36.5  PLT 208 211   BMET  Recent Labs  04/11/14 0530 04/12/14 0545  NA 143 146  K 4.5 3.2*  CL 103 103  CO2 24 29  GLUCOSE 86 82  BUN 13 11  CREATININE 0.76 0.86  CALCIUM 8.9 9.0    Studies/Results: No results found.  Medications: I have reviewed the patient's current medications.  Assessment/Plan: Sepsis/ Stap Bacteremia- urine source- sacral decubiti- Continue Antibiotic- ID consult - noted- - TEE today to further evaluate SBE; 2 D echo- ok -  PICC line- done will need either 4 or 6 weeks- depend on TEE.  Cardiology consult for TEE- schedule today  Blood culture from- 04/10/14 negative  HTN- stable  Sacral Decubiti- with abscess- s/p Surgical I &D- CT- - no osteomyelitis: wound care per surgery.  Hyperkalemia revolved in setting of sepsis and ARF- improved- - now with low K- replace  CHF- Chronic Diastolic HF- resume lasix  OSA/ Pulmonary HTN- O2- Echo - ok  RA: off MTX; continue Prednisone- chronic prednisone- 7.5 mg daily  A/C Renal failure - better  Nutrition: advance diet  Social: PT consult; will need SNF-  rehab- family in agreement consult social work.- pt with elderly husband- need wound care and IN antibiotic.  Probable d/c to SNF  Later today or tomorrow- family want either [facility] or SNF in [place].     LOS: 5 days   Monica Blankenship 04/13/2014, 7:29 AM   

## 2014-04-13 NOTE — Progress Notes (Signed)
Pt  In pacu only found mild AR

## 2014-04-13 NOTE — Procedures (Signed)
Bedside Debridement  Procedure Note  Pre-operative Diagnosis: necrotic gluteal wound  Post-operative Diagnosis: same  Indications: necrosis  Anesthesia: lidocaine with epinephrine(43ml)  Procedure Details  The procedure, risks and complications have been discussed in detail (including, but not limited to airway compromise, infection, bleeding) with the patient, and a verbal consent was obtained.    The skin was sterilely prepped and draped over the affected area in the usual fashion. After adequate local anesthesia, sharp excisional debridement using a #11 blade was performed to the skin and subcutaneous tissue to a 1x2cm area until healthy tissue, vascularized tissue.  Hemostasis was ensured.  I packed with clean gauze & dry dressing.  The patient tolerated the procedure well.      Findings: Necrotic tissue  EBL: <5 cc's  Drains: none  Condition: Tolerated procedure well and Stable   Complications: none.  Arbie Blankley, ANP-BC

## 2014-04-13 NOTE — Clinical Social Work Placement (Addendum)
Clinical Social Work Department CLINICAL SOCIAL WORK PLACEMENT NOTE 04/13/2014  Patient:  Monica Blankenship, Monica Blankenship  Account Number:  [id number] Admit date:  04/08/2014  Clinical Social Worker:  Genelle Bal, LCSW  Date/time:  04/13/2014 05:49 AM  Clinical Social Work is seeking post-discharge placement for this patient at the following level of care:   SKILLED NURSING   (*CSW will update this form in Epic as items are completed)   04/12/2014  Patient/family provided with [facility] Department of Clinical Social Work's list of facilities offering this level of care within the geographic area requested by the patient (or if unable, by the patient's family).  04/12/2014  Patient/family informed of their freedom to choose among providers that offer the needed level of care, that participate in Medicare, Medicaid or managed care program needed by the patient, have an available bed and are willing to accept the patient.    Patient/family informed of [facility]' ownership interest in [facility], as well as of the fact that they are under no obligation to receive care at this facility.  PASARR submitted to EDS on 04/12/2014 PASARR number received from EDS on 04/12/2014  FL2 transmitted to all facilities in geographic area requested by pt/family on  04/12/2014 FL2 transmitted to all facilities within larger geographic area on   Patient informed that his/her managed care company has contracts with or will negotiate with  certain facilities, including the following:     Patient/family informed of bed offers received:  04/12/2014 Patient chooses bed at [facility] Physician recommends and patient chooses bed at    Patient to be transferred to [facility] on  04/14/2014. Patient to be transferred to facility by   The following physician request were entered in Epic:  Additional Comments: 04/13/14: Talked with admissions staff at [facility] at 3:18 pm and confirmed  they can take patient, pending [facility] authorization. Daughter contacted and advised. CSW also talked with MD and informed him that patient can d/c once authorization received on Wednesday. 04/14/14 - Call made to Melissa at [facility] and message left regarding insurance authorization. 1:09 pm - Rec'd call from Melissa at [employer]. Authorization rec'd from [facility], room ready and transport can be called for patient.

## 2014-04-13 NOTE — Progress Notes (Signed)
  Echocardiogram Echocardiogram Transesophageal has been performed.  Monica Blankenship Esmond Harps 04/13/2014, 12:21 PM

## 2014-04-13 NOTE — Progress Notes (Signed)
INFECTIOUS DISEASE PROGRESS NOTE  ID: Monica Blankenship is a 80 y.o. female with  Active Problems:   Sepsis   Hyperkalemia   Severe sepsis(995.92)   Lactic acid acidosis   Encephalopathy acute   AKI (acute kidney injury)   Abscess, gluteal s/p I&D 04/09/2014   Delirium   SBE (subacute bacterial endocarditis)  Subjective: Mildly upset- hungry from "not eating for 3 days"  Abtx:  Anti-infectives   Start     Dose/Rate Route Frequency Ordered Stop   04/13/14 0000  ceFAZolin (ANCEF) 2-3 GM-% SOLR  Status:  Discontinued     2 g 100 mL/hr over 30 Minutes Intravenous Every 8 hours 04/13/14 0821 04/13/14    04/13/14 0000  ceFAZolin (ANCEF) 2-3 GM-% SOLR    Comments:  Q8 hrs- for 6 WEEKS total- via Right PICC line   2 g 100 mL/hr over 30 Minutes Intravenous Every 8 hours 04/13/14 1213     04/12/14 1500  ceFAZolin (ANCEF) IVPB 2 g/50 mL premix     2 g 100 mL/hr over 30 Minutes Intravenous 3 times per day 04/12/14 1412     04/10/14 1500  ceFAZolin (ANCEF) IVPB 1 g/50 mL premix  Status:  Discontinued     1 g 100 mL/hr over 30 Minutes Intravenous 3 times per day 04/10/14 1455 04/12/14 1412   04/08/14 1800  clindamycin (CLEOCIN) IVPB 600 mg  Status:  Discontinued     600 mg 100 mL/hr over 30 Minutes Intravenous 4 times per day 04/08/14 1433 04/09/14 1047   04/08/14 1000  vancomycin (VANCOCIN) IVPB 1000 mg/200 mL premix  Status:  Discontinued     1,000 mg 200 mL/hr over 60 Minutes Intravenous Every 24 hours 04/08/14 0857 04/10/14 1420   04/08/14 0900  piperacillin-tazobactam (ZOSYN) IVPB 3.375 g  Status:  Discontinued     3.375 g 12.5 mL/hr over 240 Minutes Intravenous 3 times per day 04/08/14 0857 04/10/14 1420   04/08/14 0400  cefTRIAXone (ROCEPHIN) 1 g in dextrose 5 % 50 mL IVPB     1 g 100 mL/hr over 30 Minutes Intravenous  Once 04/08/14 0347 04/08/14 0515      Medications:  Scheduled: . acetaminophen  1,000 mg Oral TID  . calcium gluconate  1 g Intravenous Once  .  ceFAZolin  (ANCEF) IV  2 g Intravenous 3 times per day  . collagenase   Topical Daily  . doxazosin  8 mg Oral QHS  . feeding supplement (ENSURE)  1 Container Oral TID BM  . furosemide  40 mg Oral Daily  . heparin  5,000 Units Subcutaneous 3 times per day  . lidocaine-EPINEPHrine  20 mL Intradermal Once  . metoprolol tartrate  25 mg Oral BID  . potassium chloride  20 mEq Oral BID  . predniSONE  7.5 mg Oral Q breakfast  . sodium chloride  1,000 mL Intravenous Once  . sodium chloride  2,000 mL Intravenous Once  . verapamil  240 mg Oral QHS    Objective: Vital signs in last 24 hours: Temp:  [97.5 F (36.4 C)-98.8 F (37.1 C)] 98.6 F (37 C) (05/12 1250) Pulse Rate:  [66-82] 66 (05/12 1250) Resp:  [8-20] 19 (05/12 1250) BP: (137-221)/(50-92) 162/92 mmHg (05/12 1250) SpO2:  [92 %-100 %] 99 % (05/12 1250) Weight:  [80.332 kg (177 lb 1.6 oz)] 80.332 kg (177 lb 1.6 oz) (05/11 2100)   General appearance: alert, cooperative and no distress Resp: clear to auscultation bilaterally Cardio: regular rate and rhythm  GI: soft, non-tender; bowel sounds normal; no masses,  no organomegaly gluteal wound dressed.   Lab Results  Recent Labs  04/11/14 0530 04/12/14 0545  WBC 10.2 9.4  HGB 11.0* 11.5*  HCT 33.6* 36.5  NA 143 146  K 4.5 3.2*  CL 103 103  CO2 24 29  BUN 13 11  CREATININE 0.76 0.86   Liver Panel No results found for this basename: PROT, ALBUMIN, AST, ALT, ALKPHOS, BILITOT, BILIDIR, IBILI,  in the last 72 hours Sedimentation Rate No results found for this basename: ESRSEDRATE,  in the last 72 hours C-Reactive Protein No results found for this basename: CRP,  in the last 72 hours  Microbiology: Recent Results (from the past 240 hour(s))  CULTURE, BLOOD (ROUTINE X 2)     Status: None   Collection Time    04/08/14  2:39 AM      Result Value Ref Range Status   Specimen Description BLOOD LEFT FOREARM   Final   Special Requests BOTTLES DRAWN AEROBIC ONLY [facility]   Final   Culture   Setup Time     Final   Value: 04/08/2014 09:01     Performed at [facility]   Culture     Final   Value: STAPHYLOCOCCUS AUREUS     Note: RIFAMPIN AND GENTAMICIN SHOULD NOT BE USED AS SINGLE DRUGS FOR TREATMENT OF STAPH INFECTIONS. This organism DOES NOT demonstrate inducible Clindamycin resistance in vitro.     Note: Gram Stain Report Called to,Read Back By and Verified With: ALICA GREEN ON 04/08/2014 AT 9:58P BY WILEJ     Performed at [facility]   Report Status 04/10/2014 FINAL   Final   Organism ID, Bacteria STAPHYLOCOCCUS AUREUS   Final  CULTURE, BLOOD (ROUTINE X 2)     Status: None   Collection Time    04/08/14  2:46 AM      Result Value Ref Range Status   Specimen Description BLOOD LEFT ANTECUBITAL   Final   Special Requests BOTTLES DRAWN AEROBIC AND ANAEROBIC 8CC   Final   Culture  Setup Time     Final   Value: 04/08/2014 09:01     Performed at [facility]   Culture     Final   Value: STAPHYLOCOCCUS AUREUS     Note: SUSCEPTIBILITIES PERFORMED ON PREVIOUS CULTURE WITHIN THE LAST 5 DAYS.     Note: Gram Stain Report Called to,Read Back By and Verified With: ALICIA GREEN ON 04/08/2014 AT 9:58P BY WILEJ     Performed at [facility]   Report Status 04/10/2014 FINAL   Final  URINE CULTURE     Status: None   Collection Time    04/08/14  3:20 AM      Result Value Ref Range Status   Specimen Description URINE, CATHETERIZED   Final   Special Requests NONE   Final   Culture  Setup Time     Final   Value: 04/08/2014 09:05     Performed at [facility] Count     Final   Value: >=100,000 COLONIES/ML     Performed at [facility]   Culture     Final   Value: STAPHYLOCOCCUS AUREUS     Note: RIFAMPIN AND GENTAMICIN SHOULD NOT BE USED AS SINGLE DRUGS FOR TREATMENT OF STAPH INFECTIONS.     Performed at [facility]   Report Status 04/10/2014 FINAL   Final   Organism ID,  Bacteria STAPHYLOCOCCUS AUREUS   Final  MRSA PCR  SCREENING     Status: None   Collection Time    04/08/14  7:00 AM      Result Value Ref Range Status   MRSA by PCR NEGATIVE  NEGATIVE Final   Comment:            The GeneXpert MRSA Assay (FDA     approved for NASAL specimens     only), is one component of a     comprehensive MRSA colonization     surveillance program. It is not     intended to diagnose MRSA     infection nor to guide or     monitor treatment for     MRSA infections.  MRSA PCR SCREENING     Status: None   Collection Time    04/08/14  8:39 AM      Result Value Ref Range Status   MRSA by PCR NEGATIVE  NEGATIVE Final   Comment:            The GeneXpert MRSA Assay (FDA     approved for NASAL specimens     only), is one component of a     comprehensive MRSA colonization     surveillance program. It is not     intended to diagnose MRSA     infection nor to guide or     monitor treatment for     MRSA infections.  WOUND CULTURE     Status: None   Collection Time    04/09/14  4:19 PM      Result Value Ref Range Status   Specimen Description WOUND RIGHT BUTTOCKS   Final   Special Requests NONE   Final   Gram Stain     Final   Value: RARE WBC PRESENT,BOTH PMN AND MONONUCLEAR     NO SQUAMOUS EPITHELIAL CELLS SEEN     NO ORGANISMS SEEN     Performed at [facility]   Culture     Final   Value: MODERATE STAPHYLOCOCCUS AUREUS     Note: RIFAMPIN AND GENTAMICIN SHOULD NOT BE USED AS SINGLE DRUGS FOR TREATMENT OF STAPH INFECTIONS. This organism DOES NOT demonstrate inducible Clindamycin resistance in vitro.     Performed at [facility]   Report Status 04/13/2014 FINAL   Final   Organism ID, Bacteria STAPHYLOCOCCUS AUREUS   Final  CULTURE, BLOOD (ROUTINE X 2)     Status: None   Collection Time    04/10/14  3:30 PM      Result Value Ref Range Status   Specimen Description BLOOD RIGHT HAND   Final   Special Requests BOTTLES DRAWN AEROBIC ONLY 3CC   Final   Culture  Setup Time     Final   Value:  04/10/2014 23:03     Performed at [facility]   Culture     Final   Value:        BLOOD CULTURE RECEIVED NO GROWTH TO DATE CULTURE WILL BE HELD FOR 5 DAYS BEFORE ISSUING A FINAL NEGATIVE REPORT     Performed at [facility]   Report Status PENDING   Incomplete  CULTURE, BLOOD (ROUTINE X 2)     Status: None   Collection Time    04/10/14  3:40 PM      Result Value Ref Range Status   Specimen Description BLOOD RIGHT HAND   Final   Special Requests BOTTLES  DRAWN AEROBIC ONLY 5CC   Final   Culture  Setup Time     Final   Value: 04/10/2014 23:03     Performed at [facility]   Culture     Final   Value:        BLOOD CULTURE RECEIVED NO GROWTH TO DATE CULTURE WILL BE HELD FOR 5 DAYS BEFORE ISSUING A FINAL NEGATIVE REPORT     Performed at [facility]   Report Status PENDING   Incomplete    Studies/Results: No results found.   Assessment/Plan: MV IE Necrotic Gluteal Wound, abscess MSSA MSSA bacteremia UCx MSSA Pulmonary HTN RA on prednisone  Total days of antibiotics: 7 ancef  Would plan for 42 days of IV anbx for her endocarditis.  Her repeat BCx (5-9) are ngtd.  Will have her seen in ID clinic for f/u         Ginnie Smart Infectious Diseases (pager) [phone] [email or web address] 04/13/2014, 2:29 PM  LOS: 5 days   **Disclaimer: This note may have been dictated with voice recognition software. Similar sounding words can inadvertently be transcribed and this note may contain transcription errors which may not have been corrected upon publication of note.**

## 2014-04-13 NOTE — Discharge Summary (Signed)
Physician Discharge Summary  Patient ID: Monica Blankenship MRN: [id number] DOB/AGE: 1929-12-01 80 y.o.  Admit date: 04/08/2014 Discharge date: 04/13/2014  Admission Diagnoses:  Discharge Diagnoses:  Active Problems:   Sepsis SBE-  TEE positive small vegetation with a mital valve- treatment for 6 WEEKS FOR IV antibiotic   Hyperkalemia   Severe sepsis(995.92)   Lactic acid acidosis   Encephalopathy acute   AKI (acute kidney injury)   Abscess, gluteal s/p I&D 04/09/2014-visit changes daly UTI   Delirium   SBE (subacute bacterial endocarditis)  diastolic heart falure,, Pulmonary hypetenson Mitral stenosis Chronic kidney disease anxiety Discharged Condition: good  Hospital Course: 80 years old female admitted th hypertension  Sepsis- found to have UTI and also gluteal abscess  problem #1:  Sepsis: patient was admitted to ICU; seen by pulmonary critical care started on broad spctrum antibiotics Zosyn and vancomycin; urine was positive for uTI; blood culture grew staph- MSSA; she also had gluteal abscess surgical consult was obtained status post surgical drainage with good result. White cell count improved;  blood pressure improved;require hydration. ID was consulted: patient had 2-D echo which was negative subsequently had TEE- it showed mitral vegetation-  Treatment with IV antibiotic for 6 weeks  For SBE. Problem #2: hyperkalemia: revolved Problem #3: acte renal failure due to sepsis and hypotension improved. Problem #4:  History of mitral valve disease, diastolic heart failure- stable continue current medicatons. Problem #5: hypertension- initially with low blood pressure subsequently after fluidswere pressure improved continue current medication. Problem #6: anxiety continue Xanax as need Problem #7: weakness and deconditioning continue with physical therpy.  Consults: cardiology, pulmonary/intensive care, ID and general surgery  Significant Diagnostic Studies: labs: blood cel  count norma hemoglobin stable,potassiium 3.2 microbiology: blood culture: positive for MSSA, urine culture: negative and wound culture: negative and radiology: CXR: CHF and CT scan: no oseeomyelits  Treatments: IV hydration and antibiotics: Ancef  Discharge Exam: Blood pressure 172/62, pulse 66, temperature 97.5 F (36.4 C), temperature source Oral, resp. rate 20, height 5\' 2"  (1.575 m), weight 80.332 kg (177 lb 1.6 oz), SpO2 92.00%. General appearance: alert Resp: clear to auscultation bilaterally Cardio: regular rate and rhythm and 2/6 SM GI: soft, non-tender; bowel sounds normal; no masses,  no organomegaly Extremities: extremities normal, atraumatic, no cyanosis or edema  Disposition: skilled nursing facility  Discharge Orders   Future Appointments Provider Department Dept Phone   04/27/2014 8:45 AM 04/29/2014, MD [facility] [phone]   Future Orders Complete By Expires   Diet - low sodium heart healthy  As directed    Discharge instructions  As directed    Increase activity slowly  As directed        Medication List    STOP taking these medications       methotrexate 2.5 MG tablet  Commonly known as:  RHEUMATREX      TAKE these medications       ALPRAZolam 0.25 MG tablet  Commonly known as:  XANAX  Take 0.25 mg by mouth 2 (two) times daily as needed for anxiety.     aspirin 81 MG chewable tablet  Chew 81 mg by mouth daily. Take 1-2 hrs between takign aspirin and methotrexate     calcium gluconate 500 MG tablet  Take 500 mg by mouth 2 (two) times daily.     ceFAZolin 2-3 GM-% Solr  Commonly known as:  ANCEF  Inject 50 mLs (2 g total) into the vein every 8 (eight) hours.  cholecalciferol 1000 UNITS tablet  Commonly known as:  VITAMIN D  Take 1,000 Units by mouth daily.     doxazosin 8 MG tablet  Commonly known as:  CARDURA  Take 8 mg by mouth at bedtime.     feeding supplement (ENSURE) Pudg  Take 1 Container by mouth 3 (three)  times daily between meals.     ferrous sulfate 325 (65 FE) MG tablet  Take 325 mg by mouth daily with breakfast.     folic acid 1 MG tablet  Commonly known as:  FOLVITE  Take 1 mg by mouth daily.     furosemide 40 MG tablet  Commonly known as:  LASIX  Take 20 mg by mouth daily.     loratadine 10 MG tablet  Commonly known as:  CLARITIN  Take 10 mg by mouth daily as needed for allergies.     metoprolol tartrate 25 MG tablet  Commonly known as:  LOPRESSOR  Take 25 mg by mouth 2 (two) times daily. Take 1-2 hrs between taking Metoprolol and Verapamil     potassium chloride SA 20 MEQ tablet  Commonly known as:  K-DUR,KLOR-CON  Take 20 mEq by mouth 2 (two) times daily.     predniSONE 5 MG tablet  Commonly known as:  DELTASONE  Take 7.5 mg by mouth daily with breakfast.     SYSTANE 0.4-0.3 % Soln  Generic drug:  Polyethyl Glycol-Propyl Glycol  Apply 1 drop to eye as needed (for dry eyes.).     traMADol 50 MG tablet  Commonly known as:  ULTRAM  Take 50 mg by mouth 3 (three) times daily as needed for moderate pain.     VENTOLIN HFA 108 (90 BASE) MCG/ACT inhaler  Generic drug:  albuterol  Inhale 2 puffs into the lungs every 6 (six) hours as needed for wheezing or shortness of breath.     verapamil 240 MG (CO) 24 hr tablet  Commonly known as:  COVERA HS  Take 240 mg by mouth at bedtime. Take 1-2 hrs between taking Metoprolol and Verapamil.       discharge planning time total 45 min Signed: Georgann Housekeeper 04/13/2014, 12:15 PM

## 2014-04-13 NOTE — CV Procedure (Addendum)
    Transesophageal Echocardiogram Note  Monica Blankenship [id number] July 11, 1929  Procedure: Transesophageal Echocardiogram Indications: bacteremia  Procedure Details Consent: Obtained Time Out: Verified patient identification, verified procedure, site/side was marked, verified correct patient position, special equipment/implants available, Radiology Safety Procedures followed,  medications/allergies/relevent history reviewed, required imaging and test results available.  Performed  Medications: Fentanyl:  Versed: 4 mg iv   Left Ventrical:  Normal LV   Mitral Valve: mild MR.  There is a tiny mobile mass on the tip of the MV that could be a very tiny vegetation.   There is mild - moderate MS with restricted leaflet mobility.    Aortic Valve: normal.  Mild aortic annular calcification  Tricuspid Valve: tract TR  Pulmonic Valve: normal   Left Atrium/ Left atrial appendage: normal, extensive spontaneous contrast ( "smoke")  Atrial septum: intact by color doppler  Aorta: mild calcification   Complications: No apparent complications Patient did tolerate procedure well.  Impression:  She has bacteremia and the TEE was done to help define the duration  of antibiotic therapy.   Given the presence of this tiny, mobile mass she should be treated for endocarditis.    Vesta Mixer, Montez Hageman., MD, [facility] 04/13/2014, 11:46 AM

## 2014-04-13 NOTE — Progress Notes (Signed)
Pt gone down via bed for TEE.

## 2014-04-14 ENCOUNTER — Encounter (HOSPITAL_COMMUNITY): Payer: Self-pay | Admitting: Cardiovascular Disease

## 2014-04-14 ENCOUNTER — Ambulatory Visit: Payer: Medicare PPO | Admitting: Pulmonary Disease

## 2014-04-14 MED ORDER — HEPARIN SOD (PORK) LOCK FLUSH 100 UNIT/ML IV SOLN
250.0000 [IU] | INTRAVENOUS | Status: AC | PRN
  Administered 2014-04-14: 250 [IU]

## 2014-04-14 MED ORDER — ALUM & MAG HYDROXIDE-SIMETH 200-200-20 MG/5ML PO SUSP
30.0000 mL | Freq: Once | ORAL | Status: AC
  Administered 2014-04-14: 30 mL via ORAL
  Filled 2014-04-14: qty 30

## 2014-04-14 MED ORDER — ALPRAZOLAM 0.25 MG PO TABS: 0.2500 mg | ORAL_TABLET | Freq: Two times a day (BID) | ORAL | Status: DC | PRN

## 2014-04-14 MED ORDER — HYDRALAZINE HCL 25 MG PO TABS: 25.0000 mg | ORAL_TABLET | Freq: Two times a day (BID) | ORAL | Status: DC

## 2014-04-14 MED ORDER — HYDRALAZINE HCL 25 MG PO TABS
25.0000 mg | ORAL_TABLET | Freq: Two times a day (BID) | ORAL | Status: DC
  Administered 2014-04-14: 25 mg via ORAL
  Filled 2014-04-14 (×2): qty 1

## 2014-04-14 MED ORDER — TRAMADOL HCL 50 MG PO TABS: 50.0000 mg | ORAL_TABLET | Freq: Three times a day (TID) | ORAL | Status: DC | PRN

## 2014-04-14 NOTE — Discharge Summary (Signed)
Addendum to D/C Summary on 04/13/14 Over night - no new c/o BP mild high Exam- unchanged Med Added- Hydralazine 25 mg bid See other med list/ details, from D/C summary- 04/13/14 D/c to SNF today. Georgann Housekeeper

## 2014-04-14 NOTE — Progress Notes (Signed)
1 Day Post-Op  Subjective: No changes. She is incontinent at night.   Objective: Vital signs in last 24 hours: Temp:  [97.5 F (36.4 C)-98.6 F (37 C)] 97.6 F (36.4 C) (05/13 0458) Pulse Rate:  [64-82] 64 (05/13 0458) Resp:  [8-20] 20 (05/13 0458) BP: (158-221)/(58-92) 181/82 mmHg (05/13 0458) SpO2:  [92 %-100 %] 100 % (05/13 0458) Weight:  [179 lb 4.8 oz (81.33 kg)] 179 lb 4.8 oz (81.33 kg) (05/12 2024) Last BM Date: 04/13/14  Intake/Output from previous day: 05/12 0701 - 05/13 0700 In: 340 [P.O.:240; IV Piggyback:100] Out: -  Intake/Output this shift:   PE Incision/Wound: gluteal wound is clean, no necrosis or surrounding erythema.  She has redness to her groin which appears to be from incontinence.    Lab Results:   Recent Labs  04/12/14 0545  WBC 9.4  HGB 11.5*  HCT 36.5  PLT 211   BMET  Recent Labs  04/12/14 0545  NA 146  K 3.2*  CL 103  CO2 29  GLUCOSE 82  BUN 11  CREATININE 0.86  CALCIUM 9.0   PT/INR No results found for this basename: LABPROT, INR,  in the last 72 hours ABG No results found for this basename: PHART, PCO2, PO2, HCO3,  in the last 72 hours  Studies/Results: No results found.  Anti-infectives: Anti-infectives   Start     Dose/Rate Route Frequency Ordered Stop   04/13/14 0000  ceFAZolin (ANCEF) 2-3 GM-% SOLR  Status:  Discontinued     2 g 100 mL/hr over 30 Minutes Intravenous Every 8 hours 04/13/14 0821 04/13/14    04/13/14 0000  ceFAZolin (ANCEF) 2-3 GM-% SOLR    Comments:  Q8 hrs- for 6 WEEKS total- via Right PICC line   2 g 100 mL/hr over 30 Minutes Intravenous Every 8 hours 04/13/14 1213     04/12/14 1500  ceFAZolin (ANCEF) IVPB 2 g/50 mL premix     2 g 100 mL/hr over 30 Minutes Intravenous 3 times per day 04/12/14 1412     04/10/14 1500  ceFAZolin (ANCEF) IVPB 1 g/50 mL premix  Status:  Discontinued     1 g 100 mL/hr over 30 Minutes Intravenous 3 times per day 04/10/14 1455 04/12/14 1412   04/08/14 1800  clindamycin  (CLEOCIN) IVPB 600 mg  Status:  Discontinued     600 mg 100 mL/hr over 30 Minutes Intravenous 4 times per day 04/08/14 1433 04/09/14 1047   04/08/14 1000  vancomycin (VANCOCIN) IVPB 1000 mg/200 mL premix  Status:  Discontinued     1,000 mg 200 mL/hr over 60 Minutes Intravenous Every 24 hours 04/08/14 0857 04/10/14 1420   04/08/14 0900  piperacillin-tazobactam (ZOSYN) IVPB 3.375 g  Status:  Discontinued     3.375 g 12.5 mL/hr over 240 Minutes Intravenous 3 times per day 04/08/14 0857 04/10/14 1420   04/08/14 0400  cefTRIAXone (ROCEPHIN) 1 g in dextrose 5 % 50 mL IVPB     1 g 100 mL/hr over 30 Minutes Intravenous  Once 04/08/14 0347 04/08/14 0515      Assessment/Plan  S/p I&D gluteal abscess x2  -wound is clean -BID wet to dry dressing changes -mobilize and frequent turning -follow up has been arranged  -nursing instructions provided -stable for discharge from surgical standpoint   Tinea/incontinence dermatitis  -recommend treatment per primary team   LOS: 6 days    Shiza Thelen ANP-BC 04/14/2014 9:14 AM

## 2014-04-14 NOTE — Progress Notes (Signed)
Physical Therapy Treatment Patient Details Name: Monica Blankenship MRN: [id number] DOB: [birth year] Today's Date: 04/14/2014    History of Present Illness Pt is an 80 y/o female admitted with sepsis. Worth noting: pt has limited help at home -- husband is wheelchair bound with Ca, and son is schizophrenic per pt; Daughter works long hours    PT Comments    Much improved steadiness with walking and activity tolerance with RW; Noted likely for dc to SNF today; PT in agreement with SNF for postacute rehab  Follow Up Recommendations  Supervision for mobility/OOB;SNF     Equipment Recommendations  Rolling walker with 5" wheels    Recommendations for Other Services       Precautions / Restrictions Precautions Precautions: Fall    Mobility  Bed Mobility Overal bed mobility: Needs Assistance Bed Mobility: Supine to Sit     Supine to sit: Min assist     General bed mobility comments: handheld asssit to pull to sit at EOB  Transfers Overall transfer level: Needs assistance Equipment used: Rolling walker (2 wheeled) Transfers: Sit to/from Stand Sit to Stand: Min assist         General transfer comment: Stood from bed without physical assist -- standby for steadying and cues for safe hand placement; REquired assis twith standing from low commode; cues to use grab bar  Ambulation/Gait Ambulation/Gait assistance: Min guard Ambulation Distance (Feet): 200 Feet Assistive device: Rolling walker (2 wheeled) Gait Pattern/deviations: Step-through pattern Gait velocity: Decreased   General Gait Details: Much improved steadiness with RW   Stairs            Wheelchair Mobility    Modified Rankin (Stroke Patients Only)       Balance Overall balance assessment: Needs assistance         Standing balance support: Bilateral upper extremity supported Standing balance-Leahy Scale: Fair                      Cognition Arousal/Alertness: Awake/alert Behavior  During Therapy: WFL for tasks assessed/performed Overall Cognitive Status: Within Functional Limits for tasks assessed                      Exercises      General Comments        Pertinent Vitals/Pain no apparent distress     Home Living                      Prior Function            PT Goals (current goals can now be found in the care plan section) Acute Rehab PT Goals Patient Stated Goal: To return home PT Goal Formulation: With patient/family Time For Goal Achievement: 04/18/14 Potential to Achieve Goals: Good Progress towards PT goals: Progressing toward goals    Frequency  Min 3X/week    PT Plan Current plan remains appropriate    Co-evaluation             End of Session Equipment Utilized During Treatment: Gait belt Activity Tolerance: Patient tolerated treatment well Patient left: in chair;with chair alarm set;with call bell/phone within reach     Time: 0827-0853 PT Time Calculation (min): 26 min  Charges:  $Gait Training: 23-37 mins                    G Codes:      [facility] 04/14/2014, 12:43 PM  Van Clines,  PT  Acute Rehabilitation Services Pager [phone] Office [phone]

## 2014-04-16 LAB — CULTURE, BLOOD (ROUTINE X 2)
Culture: NO GROWTH
Culture: NO GROWTH

## 2014-04-27 ENCOUNTER — Encounter: Payer: Self-pay | Admitting: Cardiology

## 2014-04-27 ENCOUNTER — Ambulatory Visit (INDEPENDENT_AMBULATORY_CARE_PROVIDER_SITE_OTHER): Payer: Medicare PPO | Admitting: Cardiology

## 2014-04-27 VITALS — BP 128/58 | HR 60 | Ht 62.0 in | Wt 179.0 lb

## 2014-04-27 DIAGNOSIS — I359 Nonrheumatic aortic valve disorder, unspecified: Secondary | ICD-10-CM

## 2014-04-27 DIAGNOSIS — I350 Nonrheumatic aortic (valve) stenosis: Secondary | ICD-10-CM

## 2014-04-27 DIAGNOSIS — I10 Essential (primary) hypertension: Secondary | ICD-10-CM

## 2014-04-27 DIAGNOSIS — I509 Heart failure, unspecified: Secondary | ICD-10-CM

## 2014-04-27 DIAGNOSIS — I5032 Chronic diastolic (congestive) heart failure: Secondary | ICD-10-CM

## 2014-04-27 DIAGNOSIS — I330 Acute and subacute infective endocarditis: Secondary | ICD-10-CM

## 2014-04-27 LAB — BASIC METABOLIC PANEL
BUN: 12 mg/dL (ref 6–23)
CHLORIDE: 104 meq/L (ref 96–112)
CO2: 28 mEq/L (ref 19–32)
Calcium: 9.4 mg/dL (ref 8.4–10.5)
Creatinine, Ser: 0.9 mg/dL (ref 0.4–1.2)
GFR: 76.02 mL/min (ref >60.00)
Glucose, Bld: 110 mg/dL — ABNORMAL HIGH (ref 70–99)
POTASSIUM: 4 meq/L (ref 3.5–5.1)
Sodium: 141 mEq/L (ref 135–145)

## 2014-04-27 NOTE — Progress Notes (Signed)
[address] [place], [place]  [address] Phone: [phone] Fax:  [phone]  Date:  04/27/2014   ID:  Monica Blankenship, DOB [birth year], MRN [id number]  PCP:  Monica Housekeeper, MD  Cardiologist:  Monica Magic, MD     History of Present Illness: Monica Blankenship is a 80 y.o. female with a history of HTN, chronic diastolic CHF.  She was recently hospitalized with sepsis and MSSA SBE of the MV now on IV antibiotics for 6 weeks.  If was felt the source was a gluteal abscess that she underwent debridement of.  She also had a UTI.  She is doing well today. She denies any chest pain, dizziness, palpitations or syncope. She has chronic SOB which occurs only occasionally and usually occurs once monthly. Her BP runs high in the am and then drops after taking her meds.  Wt Readings from Last 3 Encounters:  04/27/14 179 lb (81.194 kg)  04/13/14 179 lb 4.8 oz (81.33 kg)  04/13/14 179 lb 4.8 oz (81.33 kg)     Past Medical History  Diagnosis Date  . Allergy   . Anxiety   . Insomnia   . Osteoarthritis   . Anemia   . Fatigue   . Obesity   . Diastolic dysfunction   . CKD (chronic kidney disease)   . Mitral stenosis     with moderate MR on echo 4/14  . Aortic stenosis     mild by echo 4/14  . Aortic regurgitation     mild to moderate by echo 4/14  . HTN (hypertension)   . Chronic diastolic CHF (congestive heart failure)     Current Outpatient Prescriptions  Medication Sig Dispense Refill  . ALPRAZolam (XANAX) 0.25 MG tablet Take 1 tablet (0.25 mg total) by mouth 2 (two) times daily as needed for anxiety.  30 tablet  0  . aspirin 81 MG chewable tablet Chew 81 mg by mouth daily. Take 1-2 hrs between takign aspirin and methotrexate      . calcium gluconate 500 MG tablet Take 500 mg by mouth 2 (two) times daily.      Marland Kitchen ceFAZolin (ANCEF) 2-3 GM-% SOLR Inject 50 mLs (2 g total) into the vein every 8 (eight) hours.  2 mL  0  . cholecalciferol (VITAMIN D) 1000 UNITS tablet Take 1,000  Units by mouth daily.      Marland Kitchen doxazosin (CARDURA) 8 MG tablet Take 8 mg by mouth at bedtime.      . feeding supplement, ENSURE, (ENSURE) PUDG Take 1 Container by mouth 3 (three) times daily between meals.  10 Can  0  . ferrous sulfate 325 (65 FE) MG tablet Take 325 mg by mouth daily with breakfast.      . folic acid (FOLVITE) 1 MG tablet Take 1 mg by mouth daily.      . furosemide (LASIX) 40 MG tablet Take 20 mg by mouth daily.      . hydrALAZINE (APRESOLINE) 25 MG tablet Take 1 tablet (25 mg total) by mouth 2 (two) times daily.  60 tablet  3  . loratadine (CLARITIN) 10 MG tablet Take 10 mg by mouth daily as needed for allergies.      . metoprolol tartrate (LOPRESSOR) 25 MG tablet Take 25 mg by mouth 2 (two) times daily. Take 1-2 hrs between taking Metoprolol and Verapamil      . Polyethyl Glycol-Propyl Glycol (SYSTANE) 0.4-0.3 % SOLN Apply 1 drop to eye as needed (for dry  eyes.).       Marland Kitchen potassium chloride SA (K-DUR,KLOR-CON) 20 MEQ tablet Take 20 mEq by mouth 2 (two) times daily.      . predniSONE (DELTASONE) 5 MG tablet Take 7.5 mg by mouth daily with breakfast.      . traMADol (ULTRAM) 50 MG tablet Take 1 tablet (50 mg total) by mouth 3 (three) times daily as needed for moderate pain.  30 tablet  0  . VENTOLIN HFA 108 (90 BASE) MCG/ACT inhaler Inhale 2 puffs into the lungs every 6 (six) hours as needed for wheezing or shortness of breath.       . verapamil (COVERA HS) 240 MG (CO) 24 hr tablet Take 240 mg by mouth at bedtime. Take 1-2 hrs between taking Metoprolol and Verapamil.       No current facility-administered medications for this visit.    Allergies:    Allergies  Allergen Reactions  . Fentanyl Other (See Comments)    confusion    Social History:  The patient  reports that she has quit smoking. She has never used smokeless tobacco. She reports that she does not drink alcohol or use illicit drugs.   Family History:  The patient's family history includes CVA in her mother;  Hypertension in her mother.   ROS:  Please see the history of present illness.      All other systems reviewed and negative.   PHYSICAL EXAM: VS:  BP 128/58  Pulse 60  Ht 5\' 2"  (1.575 m)  Wt 179 lb (81.194 kg)  BMI 32.73 kg/m2 Well nourished, well developed, in no acute distress HEENT: normal Neck: no JVD Cardiac:  normal S1, S2; RRR; no murmur Lungs:  clear to auscultation bilaterally, no wheezing, rhonchi or rales Abd: soft, nontender, no hepatomegaly Ext: no edema Skin: warm and dry Neuro:  CNs 2-12 intact, no focal abnormalities noted       ASSESSMENT AND PLAN:  1. MSSA Subacute bacterial endocarditis of the mitral valve - currently on IV antibiotics - 2D echo in July to reassess MV 2. Mild MS and moderate AR  3. Mild MS and moderate MR 4.  Chronic diastolic CHF - appears compensated - continue Lasix/beta blocker/Verapamil  - check BMET  5.  HTN - elevated on exam today - continue Verapamil/beta blocker/doxazosin/Hydralazine   Follouwp with me in 3 months   Signed, August, MD 04/27/2014 9:11 AM

## 2014-04-27 NOTE — Patient Instructions (Addendum)
Will obtain labs today and call you with the results (bmet)  Your physician has requested that you have an echocardiogram. Echocardiography is a painless test that uses sound waves to create images of your heart. It provides your doctor with information about the size and shape of your heart and how well your heart's chambers and valves are working. This procedure takes approximately one hour. There are no restrictions for this procedure.  IN [place]  Your physician recommends that you continue on your current medications as directed. Please refer to the Current Medication list given to you today.  Your physician recommends that you schedule a follow-up appointment in: 3 month ov

## 2014-05-04 ENCOUNTER — Encounter (INDEPENDENT_AMBULATORY_CARE_PROVIDER_SITE_OTHER): Payer: Medicare PPO

## 2014-05-19 ENCOUNTER — Telehealth: Payer: Self-pay | Admitting: Pulmonary Disease

## 2014-05-19 DIAGNOSIS — G4734 Idiopathic sleep related nonobstructive alveolar hypoventilation: Secondary | ICD-10-CM

## 2014-05-19 DIAGNOSIS — J449 Chronic obstructive pulmonary disease, unspecified: Secondary | ICD-10-CM

## 2014-05-19 NOTE — Telephone Encounter (Signed)
Called spoke wth pt daughter. She wants [facility] to come out and make sure pt O2 is in working order prior to pt release from rehab. I have placed order. Nothing further needed

## 2014-05-21 ENCOUNTER — Emergency Department (HOSPITAL_COMMUNITY): Payer: Medicare PPO

## 2014-05-21 ENCOUNTER — Inpatient Hospital Stay (HOSPITAL_COMMUNITY)
Admission: EM | Admit: 2014-05-21 | Discharge: 2014-05-24 | DRG: 308 | Disposition: A | Discharge status: Skilled Nursing Facility | Payer: Medicare PPO | Attending: Internal Medicine | Admitting: Internal Medicine

## 2014-05-21 ENCOUNTER — Encounter (HOSPITAL_COMMUNITY): Payer: Self-pay | Admitting: Emergency Medicine

## 2014-05-21 DIAGNOSIS — N189 Chronic kidney disease, unspecified: Secondary | ICD-10-CM | POA: Diagnosis present

## 2014-05-21 DIAGNOSIS — I129 Hypertensive chronic kidney disease with stage 1 through stage 4 chronic kidney disease, or unspecified chronic kidney disease: Secondary | ICD-10-CM | POA: Diagnosis present

## 2014-05-21 DIAGNOSIS — I498 Other specified cardiac arrhythmias: Principal | ICD-10-CM | POA: Diagnosis present

## 2014-05-21 DIAGNOSIS — I4891 Unspecified atrial fibrillation: Secondary | ICD-10-CM | POA: Diagnosis present

## 2014-05-21 DIAGNOSIS — L03317 Cellulitis of buttock: Secondary | ICD-10-CM

## 2014-05-21 DIAGNOSIS — N179 Acute kidney failure, unspecified: Secondary | ICD-10-CM | POA: Diagnosis present

## 2014-05-21 DIAGNOSIS — D649 Anemia, unspecified: Secondary | ICD-10-CM | POA: Diagnosis present

## 2014-05-21 DIAGNOSIS — R55 Syncope and collapse: Secondary | ICD-10-CM

## 2014-05-21 DIAGNOSIS — Z8249 Family history of ischemic heart disease and other diseases of the circulatory system: Secondary | ICD-10-CM | POA: Diagnosis not present

## 2014-05-21 DIAGNOSIS — Z87440 Personal history of urinary (tract) infections: Secondary | ICD-10-CM

## 2014-05-21 DIAGNOSIS — R001 Bradycardia, unspecified: Secondary | ICD-10-CM | POA: Diagnosis present

## 2014-05-21 DIAGNOSIS — I509 Heart failure, unspecified: Secondary | ICD-10-CM | POA: Diagnosis present

## 2014-05-21 DIAGNOSIS — M1990 Unspecified osteoarthritis, unspecified site: Secondary | ICD-10-CM | POA: Diagnosis present

## 2014-05-21 DIAGNOSIS — Z7982 Long term (current) use of aspirin: Secondary | ICD-10-CM | POA: Diagnosis not present

## 2014-05-21 DIAGNOSIS — IMO0002 Reserved for concepts with insufficient information to code with codable children: Secondary | ICD-10-CM | POA: Diagnosis not present

## 2014-05-21 DIAGNOSIS — R5381 Other malaise: Secondary | ICD-10-CM | POA: Diagnosis present

## 2014-05-21 DIAGNOSIS — Z823 Family history of stroke: Secondary | ICD-10-CM

## 2014-05-21 DIAGNOSIS — Z6830 Body mass index (BMI) 30.0-30.9, adult: Secondary | ICD-10-CM

## 2014-05-21 DIAGNOSIS — E669 Obesity, unspecified: Secondary | ICD-10-CM | POA: Diagnosis present

## 2014-05-21 DIAGNOSIS — I330 Acute and subacute infective endocarditis: Secondary | ICD-10-CM | POA: Diagnosis present

## 2014-05-21 DIAGNOSIS — F411 Generalized anxiety disorder: Secondary | ICD-10-CM | POA: Diagnosis present

## 2014-05-21 DIAGNOSIS — I5032 Chronic diastolic (congestive) heart failure: Secondary | ICD-10-CM | POA: Diagnosis present

## 2014-05-21 DIAGNOSIS — L0231 Cutaneous abscess of buttock: Secondary | ICD-10-CM | POA: Diagnosis present

## 2014-05-21 DIAGNOSIS — I080 Rheumatic disorders of both mitral and aortic valves: Secondary | ICD-10-CM | POA: Diagnosis present

## 2014-05-21 DIAGNOSIS — E860 Dehydration: Secondary | ICD-10-CM | POA: Diagnosis present

## 2014-05-21 DIAGNOSIS — E875 Hyperkalemia: Secondary | ICD-10-CM | POA: Diagnosis present

## 2014-05-21 DIAGNOSIS — Z87891 Personal history of nicotine dependence: Secondary | ICD-10-CM

## 2014-05-21 LAB — I-STAT CHEM 8, ED
BUN: 44 mg/dL — AB (ref 6–23)
Calcium, Ion: 1.17 mmol/L (ref 1.13–1.30)
Chloride: 106 mEq/L (ref 96–112)
Creatinine, Ser: 1.6 mg/dL — ABNORMAL HIGH (ref 0.50–1.10)
GLUCOSE: 143 mg/dL — AB (ref 70–99)
HCT: 36 % (ref 36.0–46.0)
HEMOGLOBIN: 12.2 g/dL (ref 12.0–15.0)
Potassium: 6.3 mEq/L — ABNORMAL HIGH (ref 3.7–5.3)
Sodium: 137 mEq/L (ref 137–147)
TCO2: 21 mmol/L (ref 0–100)

## 2014-05-21 LAB — COMPREHENSIVE METABOLIC PANEL
AST: 29 U/L (ref 0–37)
Albumin: 3.5 g/dL (ref 3.5–5.2)
Alkaline Phosphatase: 62 U/L (ref 39–117)
BUN: 35 mg/dL — ABNORMAL HIGH (ref 6–23)
CALCIUM: 9.4 mg/dL (ref 8.4–10.5)
CO2: 19 meq/L (ref 19–32)
Chloride: 99 mEq/L (ref 96–112)
Creatinine, Ser: 1.45 mg/dL — ABNORMAL HIGH (ref 0.50–1.10)
GFR, EST AFRICAN AMERICAN: 38 mL/min — AB (ref >90)
GFR, EST NON AFRICAN AMERICAN: 33 mL/min — AB (ref >90)
GLUCOSE: 144 mg/dL — AB (ref 70–99)
Potassium: 6.3 mEq/L — ABNORMAL HIGH (ref 3.7–5.3)
SODIUM: 135 meq/L — AB (ref 137–147)
TOTAL PROTEIN: 7.8 g/dL (ref 6.0–8.3)
Total Bilirubin: 0.5 mg/dL (ref 0.3–1.2)

## 2014-05-21 LAB — CBC
HCT: 32.5 % — ABNORMAL LOW (ref 36.0–46.0)
Hemoglobin: 10.6 g/dL — ABNORMAL LOW (ref 12.0–15.0)
MCH: 31.5 pg (ref 26.0–34.0)
MCHC: 32.6 g/dL (ref 30.0–36.0)
MCV: 96.4 fL (ref 78.0–100.0)
PLATELETS: 140 10*3/uL — AB (ref 150–400)
RBC: 3.37 MIL/uL — ABNORMAL LOW (ref 3.87–5.11)
RDW: 18.3 % — ABNORMAL HIGH (ref 11.5–15.5)
WBC: 8.8 10*3/uL (ref 4.0–10.5)

## 2014-05-21 LAB — I-STAT TROPONIN, ED: Troponin i, poc: 0.03 ng/mL (ref 0.00–0.08)

## 2014-05-21 LAB — I-STAT CG4 LACTIC ACID, ED: Lactic Acid, Venous: 1.72 mmol/L (ref 0.5–2.2)

## 2014-05-21 MED ORDER — INSULIN ASPART 100 UNIT/ML IV SOLN
10.0000 [IU] | Freq: Once | INTRAVENOUS | Status: AC
  Administered 2014-05-21: 10 [IU] via INTRAVENOUS
  Filled 2014-05-21: qty 0.1

## 2014-05-21 MED ORDER — SODIUM CHLORIDE 0.9 % IV BOLUS (SEPSIS)
500.0000 mL | Freq: Once | INTRAVENOUS | Status: AC
  Administered 2014-05-21: 500 mL via INTRAVENOUS

## 2014-05-21 MED ORDER — ATROPINE SULFATE 1 MG/ML IJ SOLN: 0.5000 mg | Freq: Once | INTRAMUSCULAR | Status: DC

## 2014-05-21 MED ORDER — DEXTROSE 50 % IV SOLN
50.0000 mL | Freq: Once | INTRAVENOUS | Status: AC
  Administered 2014-05-21: 50 mL via INTRAVENOUS
  Filled 2014-05-21: qty 50

## 2014-05-21 MED ORDER — CALCIUM GLUCONATE 10 % IV SOLN
1.0000 g | Freq: Once | INTRAVENOUS | Status: AC
  Administered 2014-05-21: 1 g via INTRAVENOUS
  Filled 2014-05-21: qty 10

## 2014-05-21 NOTE — H&P (Signed)
PCP:  Georgann HousekeeperHUSAIN,KARRAR, MD    Chief Complaint:  Found down  HPI: Monica Blankenship is a 80 y.o. female   has a past medical history of Allergy; Anxiety; Insomnia; Osteoarthritis; Anemia; Fatigue; Obesity; Diastolic dysfunction; CKD (chronic kidney disease); Mitral stenosis; Aortic stenosis; Aortic regurgitation; HTN (hypertension); and Chronic diastolic CHF (congestive heart failure).   Patient have had a recent admitting for sepsis and hyperkalemia with bradycardia. She was discharged 5/13 to [facility]. Family states facility notified them that for the past 3 days her HR have been in 40's. Today she was found down. Patient states she missed the chair.  She denies syncope. Patietn was brought to ER and was noted to be persistently bradycardic down to 30-40 with Junctional and A.fib. Patient was on verapamil and metoprolol but metoprolol may have been stopped. While interviewing the patient her HR went up to 80's. Of note her K was noted to be elevated to 6.3 and this was treated in ER. patient denies any SOB or chest pain.  Pateint is sp PICC line for hx of sepsis on Ancef  Hospitalist was called for admission for symptomatic bradycardia  Review of Systems:    Pertinent positives include: fall, lightheaded  Constitutional:  No weight loss, night sweats, Fevers, chills, fatigue, weight loss  HEENT:  No headaches, Difficulty swallowing,Tooth/dental problems,Sore throat,  No sneezing, itching, ear ache, nasal congestion, post nasal drip,  Cardio-vascular:  No chest pain, Orthopnea, PND, anasarca, dizziness, palpitations.no Bilateral lower extremity swelling  GI:  No heartburn, indigestion, abdominal pain, nausea, vomiting, diarrhea, change in bowel habits, loss of appetite, melena, blood in stool, hematemesis Resp:  no shortness of breath at rest. No dyspnea on exertion, No excess mucus, no productive cough, No non-productive cough, No coughing up of blood.No change in color of  mucus.No wheezing. Skin:  no rash or lesions. No jaundice GU:  no dysuria, change in color of urine, no urgency or frequency. No straining to urinate.  No flank pain.  Musculoskeletal:  No joint pain or no joint swelling. No decreased range of motion. No back pain.  Psych:  No change in mood or affect. No depression or anxiety. No memory loss.  Neuro: no localizing neurological complaints, no tingling, no weakness, no double vision, no gait abnormality, no slurred speech, no confusion  Otherwise ROS are negative except for above, 10 systems were reviewed  Past Medical History: Past Medical History  Diagnosis Date  . Allergy   . Anxiety   . Insomnia   . Osteoarthritis   . Anemia   . Fatigue   . Obesity   . Diastolic dysfunction   . CKD (chronic kidney disease)   . Mitral stenosis     with moderate MR on echo 4/14  . Aortic stenosis     mild by echo 4/14  . Aortic regurgitation     mild to moderate by echo 4/14  . HTN (hypertension)   . Chronic diastolic CHF (congestive heart failure)    Past Surgical History  Procedure Laterality Date  . Eye surgery    . Abdominal hysterectomy    . Appendectomy    . Cataract extraction, bilateral    . Tee without cardioversion N/A 04/13/2014    Procedure: TRANSESOPHAGEAL ECHOCARDIOGRAM (TEE);  Surgeon: Vesta MixerPhilip J Nahser, MD;  [facility];  Service: Cardiovascular;  Laterality: N/A;     Medications: Prior to Admission medications   Medication Sig Start Date End Date Taking? Authorizing Tawnya Pujol  albuterol (  PROVENTIL HFA;VENTOLIN HFA) 108 (90 BASE) MCG/ACT inhaler Inhale 2 puffs into the lungs every 6 (six) hours as needed for shortness of breath.   Yes Historical Damichael Hofman, MD  ALPRAZolam (XANAX) 0.25 MG tablet Take 1 tablet (0.25 mg total) by mouth 2 (two) times daily as needed for anxiety. 04/14/14  Yes Georgann Housekeeper, MD  aspirin 81 MG chewable tablet Chew 81 mg by mouth daily.    Yes Historical Blaze Nylund, MD  ceFAZolin  (ANCEF) 2-3 GM-% SOLR Inject 50 mLs (2 g total) into the vein every 8 (eight) hours. 04/13/14  Yes Georgann Housekeeper, MD  cholecalciferol (VITAMIN D) 1000 UNITS tablet Take 1,000 Units by mouth daily.   Yes Historical Haillee Johann, MD  doxazosin (CARDURA) 8 MG tablet Take 8 mg by mouth at bedtime.   Yes Historical Madisson Kulaga, MD  ferrous sulfate 325 (65 FE) MG tablet Take 325 mg by mouth daily with breakfast.   Yes Historical Sheree Lalla, MD  folic acid (FOLVITE) 1 MG tablet Take 1 mg by mouth daily.   Yes Historical Nanci Lakatos, MD  furosemide (LASIX) 20 MG tablet Take 20 mg by mouth daily.   Yes Historical Naba Sneed, MD  hydrALAZINE (APRESOLINE) 50 MG tablet Take 50 mg by mouth See admin instructions. Take every 8 hours for hypertension - hold for SBP <110   Yes Historical Bethaney Oshana, MD  Hypromellose (GENTEAL MILD TO MODERATE OP) Place 1 drop into both eyes daily as needed (dry eyes).   Yes Historical Kalyn Dimattia, MD  loratadine (CLARITIN) 10 MG tablet Take 10 mg by mouth daily as needed for allergies.   Yes Historical Zymire Turnbo, MD  potassium chloride SA (K-DUR,KLOR-CON) 20 MEQ tablet Take 20 mEq by mouth 2 (two) times daily. 8am and 4pm   Yes Historical Erinn Huskins, MD  predniSONE (DELTASONE) 2.5 MG tablet Take 7.5 mg by mouth daily with breakfast.   Yes Historical Enes Rokosz, MD  traMADol (ULTRAM) 50 MG tablet Take 1 tablet (50 mg total) by mouth 3 (three) times daily as needed for moderate pain. 04/14/14  Yes Georgann Housekeeper, MD  verapamil (CALAN-SR) 240 MG CR tablet Take 240 mg by mouth at bedtime.   Yes Historical Jarron Curley, MD    Allergies:   Allergies  Allergen Reactions  . Fentanyl Other (See Comments)    confusion    Social History:  Ambulatory  Cane    [facility]   reports that she has quit smoking. She has never used smokeless tobacco. She reports that she does not drink alcohol or use illicit drugs.    Family History: family history includes CVA in her mother; Hypertension in her  mother.    Physical Exam: Patient Vitals for the past 24 hrs:  BP Temp Temp src Pulse Resp SpO2  05/21/14 2315 163/54 mmHg - - 44 17 100 %  05/21/14 2300 177/53 mmHg - - 39 24 100 %  05/21/14 2245 178/64 mmHg - - 45 13 100 %  05/21/14 2230 171/57 mmHg - - 38 15 100 %  05/21/14 2215 172/57 mmHg - - 39 15 100 %  05/21/14 2200 152/50 mmHg - - 39 15 99 %  05/21/14 2145 164/49 mmHg - - 39 20 100 %  05/21/14 2130 138/55 mmHg - - 52 13 99 %  05/21/14 2115 160/72 mmHg - - 42 22 99 %  05/21/14 2045 163/53 mmHg - - 39 14 100 %  05/21/14 2030 152/56 mmHg - - 40 - 100 %  05/21/14 2016 149/55 mmHg - - 100  14 98 %  05/21/14 1945 146/55 mmHg 97.6 F (36.4 C) Oral 49 12 99 %  05/21/14 1936 - - - - - 96 %    1. General:  in No Acute distress 2. Psychological: Alert and  Oriented 3. Head/ENT:   Moist  Mucous Membranes                          Head Non traumatic, neck supple                          Normal  Dentition 4. SKIN: normal   Skin turgor,  Skin clean Dry and intact no rash 5. Heart: irregular rate and rhythm no Murmur, Rub or gallop 6. Lungs:  no wheezes occasional crackles   7. Abdomen: Soft, non-tender, Non distended, obese 8. Lower extremities: no clubbing, cyanosis, or edema 9. Neurologically Grossly intact, moving all 4 extremities equally 10. MSK: Normal range of motion  body mass index is unknown because there is no weight on file.   Labs on Admission:   Recent Labs  05/21/14 2010 05/21/14 2028  NA 135* 137  K 6.3* 6.3*  CL 99 106  CO2 19  --   GLUCOSE 144* 143*  BUN 35* 44*  CREATININE 1.45* 1.60*  CALCIUM 9.4  --     Recent Labs  05/21/14 2010  AST 29  ALT <5  ALKPHOS 62  BILITOT 0.5  PROT 7.8  ALBUMIN 3.5   No results found for this basename: LIPASE, AMYLASE,  in the last 72 hours  Recent Labs  05/21/14 2010 05/21/14 2028  WBC 8.8  --   HGB 10.6* 12.2  HCT 32.5* 36.0  MCV 96.4  --   PLT 140*  --    No results found for this basename:  CKTOTAL, CKMB, CKMBINDEX, TROPONINI,  in the last 72 hours No results found for this basename: TSH, T4TOTAL, FREET3, T3FREE, THYROIDAB,  in the last 72 hours No results found for this basename: VITAMINB12, FOLATE, FERRITIN, TIBC, IRON, RETICCTPCT,  in the last 72 hours No results found for this basename: HGBA1C    The CrCl is unknown because both a height and weight (above a minimum accepted value) are required for this calculation. ABG    Component Value Date/Time   PHART 7.379 04/08/2014 0906   HCO3 20.1 04/08/2014 0906   TCO2 21 05/21/2014 2028   ACIDBASEDEF 4.0* 04/08/2014 0906   O2SAT 98.0 04/08/2014 0906     No results found for this basename: DDIMER     Other results:  I have pearsonaly reviewed this: ECG REPORT  Rate: 58  Rhythm: a.fib ST&T Change: no ischemia  BNP (last 3 results)  Recent Labs  10/01/13 1617 04/08/14 0807 04/09/14 0909  PROBNP 129.0* 5143.0* 16685.0*    There were no vitals filed for this visit.   Cultures:    Component Value Date/Time   SDES BLOOD RIGHT HAND 04/10/2014 1540   SPECREQUEST BOTTLES DRAWN AEROBIC ONLY 5CC 04/10/2014 1540   CULT  Value: NO GROWTH 5 DAYS Performed at [facility] 04/10/2014 1540   REPTSTATUS 04/16/2014 FINAL 04/10/2014 1540      Radiological Exams on Admission: Dg Chest 1 View  05/21/2014   CLINICAL DATA:  80 year old female with bradycardia.  EXAM: CHEST - 1 VIEW  COMPARISON:  04/10/2014 prior chest radiographs  FINDINGS: Cardiomegaly and mild pulmonary vascular congestion noted.  Defibrillator pads overlying  the chest are noted.  A right PICC line is present with tip overlying the lower SVC.  There is no evidence of focal airspace disease, pulmonary edema, suspicious pulmonary nodule/mass, pleural effusion, or pneumothorax. No acute bony abnormalities are identified.  IMPRESSION: Cardiomegaly with mild pulmonary vascular congestion.   Electronically Signed   By: Laveda Abbe M.D.   On: 05/21/2014 20:27    Chart has  been reviewed  Assessment/Plan  80 yo F With hx of admission for sepsis last month here with hyperkalemia and symptomatic bradycardia currently resolved  Present on Admission:  . Bradycardia - hold verapmil, cardiology consult in AM. Check TSH, cycle CE . Anemia - chronic at baseline . HTN (hypertension) - continue hydralazine . Hyperkalemia - sp treatment with Ca and insulin in ER will recheck K and follow HX of UTI leading to sepsis - cont ancef   Prophylaxis: SCD , Protonix  CODE STATUS:  FULL CODE   Other plan as per orders.  I have spent a total of 55  on this admission  DOUTOVA,ANASTASSIA 05/21/2014, 11:41 PM  [facility]  Pager [phone]   If 7AM-7PM, please contact the day team taking care of the patient  [email or web address]  Password [email or web address]

## 2014-05-21 NOTE — ED Provider Notes (Signed)
I saw and evaluated the patient, reviewed the resident's note and I agree with the findings and plan.   EKG Interpretation None      Results for orders placed during the hospital encounter of 05/21/14  CBC      Result Value Ref Range   WBC 8.8  4.0 - 10.5 K/uL   RBC 3.37 (*) 3.87 - 5.11 MIL/uL   Hemoglobin 10.6 (*) 12.0 - 15.0 g/dL   HCT 16.1 (*) 09.6 - 04.5 %   MCV 96.4  78.0 - 100.0 fL   MCH 31.5  26.0 - 34.0 pg   MCHC 32.6  30.0 - 36.0 g/dL   RDW 40.9 (*) 81.1 - 91.4 %   Platelets 140 (*) 150 - 400 K/uL  COMPREHENSIVE METABOLIC PANEL      Result Value Ref Range   Sodium 135 (*) 137 - 147 mEq/L   Potassium 6.3 (*) 3.7 - 5.3 mEq/L   Chloride 99  96 - 112 mEq/L   CO2 19  19 - 32 mEq/L   Glucose, Bld 144 (*) 70 - 99 mg/dL   BUN 35 (*) 6 - 23 mg/dL   Creatinine, Ser 7.82 (*) 0.50 - 1.10 mg/dL   Calcium 9.4  8.4 - 95.6 mg/dL   Total Protein 7.8  6.0 - 8.3 g/dL   Albumin 3.5  3.5 - 5.2 g/dL   AST 29  0 - 37 U/L   ALT PENDING  0 - 35 U/L   Alkaline Phosphatase 62  39 - 117 U/L   Total Bilirubin 0.5  0.3 - 1.2 mg/dL   GFR calc non Af Amer 33 (*) >90 mL/min   GFR calc Af Amer 38 (*) >90 mL/min  I-STAT CHEM 8, ED      Result Value Ref Range   Sodium 137  137 - 147 mEq/L   Potassium 6.3 (*) 3.7 - 5.3 mEq/L   Chloride 106  96 - 112 mEq/L   BUN 44 (*) 6 - 23 mg/dL   Creatinine, Ser 2.13 (*) 0.50 - 1.10 mg/dL   Glucose, Bld 086 (*) 70 - 99 mg/dL   Calcium, Ion 5.78  4.69 - 1.30 mmol/L   TCO2 21  0 - 100 mmol/L   Hemoglobin 12.2  12.0 - 15.0 g/dL   HCT 62.9  52.8 - 41.3 %  I-STAT TROPOININ, ED      Result Value Ref Range   Troponin i, poc 0.03  0.00 - 0.08 ng/mL   Comment 3           I-STAT CG4 LACTIC ACID, ED      Result Value Ref Range   Lactic Acid, Venous 1.72  0.5 - 2.2 mmol/L   Dg Chest 1 View  05/21/2014   CLINICAL DATA:  80 year old female with bradycardia.  EXAM: CHEST - 1 VIEW  COMPARISON:  04/10/2014 prior chest radiographs  FINDINGS: Cardiomegaly and mild  pulmonary vascular congestion noted.  Defibrillator pads overlying the chest are noted.  A right PICC line is present with tip overlying the lower SVC.  There is no evidence of focal airspace disease, pulmonary edema, suspicious pulmonary nodule/mass, pleural effusion, or pneumothorax. No acute bony abnormalities are identified.  IMPRESSION: Cardiomegaly with mild pulmonary vascular congestion.   Electronically Signed   By: Laveda Abbe M.D.   On: 05/21/2014 20:27    CRITICAL CARE Performed by: Vanetta Mulders Total critical care time: 30 Critical care time was exclusive of separately billable procedures and treating other patients.  Critical care was necessary to treat or prevent imminent or life-threatening deterioration. Critical care was time spent personally by me on the following activities: development of treatment plan with patient and/or surrogate as well as nursing, discussions with consultants, evaluation of patient's response to treatment, examination of patient, obtaining history from patient or surrogate, ordering and performing treatments and interventions, ordering and review of laboratory studies, ordering and review of radiographic studies, pulse oximetry and re-evaluation of patient's condition.   Patient with significant bradycardia. Prior history of atrophic relation. Patient is on Cardura is on potassium supplements as well is on verapamil. Initial rhythm looked as if she was overly blocked. No obvious complete heart block or second degree AV block. No widening of the QRS complexes no peak T waves. The patient's potassium came back significant elevated at 6.3. Troponin was normal lactic acid was less than 2 no fever patient mentating okay no significant leukocytosis no significant anemia.  For the marked hyperkalemia patient will be treated with calcium gluconate. This could help also the verapamil if she is getting too much of that. Patient may have had this in overtly given at the  wrong doses even though she's from a rehabilitation center. Patient's renal function not significantly abnormal creatinine 1.45. No significant acidosis. Patient will require admission we'll reevaluate after the calcium gluconate and then arrange admission.  Vanetta Mulders, MD 05/21/14 2153

## 2014-05-21 NOTE — ED Notes (Signed)
Pt remains A&Ox4, NAD, calm, interactive, no changes, skin W&D, no dyspnea noted. HR decreasing consistently 41-48. Dr. Clayborne Dana notified, orders received.

## 2014-05-21 NOTE — ED Notes (Addendum)
Here from [facility] in [place]. Sent to ED for AMS, sob, dizziness, diaphoresis, HR afib 24. First EKG at facility was 1616. EMS gave 400cc NS bolus w/o improvement. Atropine 0.5 mg given with improvement. Became more alert, interactive, appropriate, skin W&D, with HR up to 54. Arrives to ED alert, NAD, calm, interactive, resps e/u, speaking in clear complete sentences, VSS, no dyspnea. (denies: CP, sob or nausea), endorses some dizziness when sitting up. Wears O2 1L at home. Mentions some m/s posterior neck pain. Recently here at [facility] ED for urosepsis. "Has R upper arm PICC, receiving Ancef daily for UTI". See paperwork and EKGs from EMS and facility.

## 2014-05-21 NOTE — ED Provider Notes (Signed)
CSN: [id number]     Arrival date & time 05/21/14  1923 History   First MD Initiated Contact with Patient 05/21/14 2002     Chief Complaint  Patient presents with  . Atrial Fibrillation  . Bradycardia     (Consider location/radiation/quality/duration/timing/severity/associated sxs/prior Treatment) Patient is a 80 y.o. female presenting with altered mental status.  Altered Mental Status Presenting symptoms: behavior changes   Severity:  Mild Most recent episode:  Today Episode history:  Single Progression:  Resolved Chronicity:  New Context: not alcohol use, not dementia, not drug use, not head injury, not a recent change in medication, not a recent illness and not a recent infection   Associated symptoms: no abdominal pain, no fever, no headaches, no light-headedness, no nausea, no palpitations, no rash, no vomiting and no weakness     Past Medical History  Diagnosis Date  . Allergy   . Anxiety   . Insomnia   . Osteoarthritis   . Anemia   . Fatigue   . Obesity   . Diastolic dysfunction   . CKD (chronic kidney disease)   . Mitral stenosis     with moderate MR on echo 4/14  . Aortic stenosis     mild by echo 4/14  . Aortic regurgitation     mild to moderate by echo 4/14  . HTN (hypertension)   . Chronic diastolic CHF (congestive heart failure)    Past Surgical History  Procedure Laterality Date  . Eye surgery    . Abdominal hysterectomy    . Appendectomy    . Cataract extraction, bilateral    . Tee without cardioversion N/A 04/13/2014    Procedure: TRANSESOPHAGEAL ECHOCARDIOGRAM (TEE);  Surgeon: Vesta Mixer, MD;  Location: [facility] ENDOSCOPY;  Service: Cardiovascular;  Laterality: N/A;   Family History  Problem Relation Age of Onset  . Hypertension Mother   . CVA Mother    History  Substance Use Topics  . Smoking status: Former Smoker -- 0.25 packs/day for 1 years  . Smokeless tobacco: Never Used     Comment: smoked 1-3 cigs/day in school for about a year some  days only  . Alcohol Use: No   OB History   Grav Para Term Preterm Abortions TAB SAB Ect Mult Living                 Review of Systems  Constitutional: Negative for fever and activity change.  HENT: Negative for congestion and facial swelling.   Eyes: Negative for discharge and redness.  Respiratory: Negative for cough and shortness of breath.   Cardiovascular: Negative for chest pain and palpitations.  Gastrointestinal: Negative for nausea, vomiting, abdominal pain and abdominal distention.  Endocrine: Negative for polydipsia and polyuria.  Genitourinary: Negative for dysuria and menstrual problem.  Musculoskeletal: Negative for back pain and joint swelling.  Skin: Negative for color change, rash and wound.  Neurological: Negative for dizziness, weakness, light-headedness and headaches.      Allergies  Fentanyl  Home Medications   Prior to Admission medications   Medication Sig Start Date End Date Taking? Authorizing Provider  albuterol (PROVENTIL HFA;VENTOLIN HFA) 108 (90 BASE) MCG/ACT inhaler Inhale 2 puffs into the lungs every 6 (six) hours as needed for shortness of breath.   Yes Historical Provider, MD  ALPRAZolam (XANAX) 0.25 MG tablet Take 1 tablet (0.25 mg total) by mouth 2 (two) times daily as needed for anxiety. 04/14/14  Yes Georgann Housekeeper, MD  aspirin 81 MG chewable tablet Chew 81  mg by mouth daily.    Yes Historical Provider, MD  ceFAZolin (ANCEF) 2-3 GM-% SOLR Inject 50 mLs (2 g total) into the vein every 8 (eight) hours. 04/13/14  Yes Georgann Housekeeper, MD  cholecalciferol (VITAMIN D) 1000 UNITS tablet Take 1,000 Units by mouth daily.   Yes Historical Provider, MD  doxazosin (CARDURA) 8 MG tablet Take 8 mg by mouth at bedtime.   Yes Historical Provider, MD  ferrous sulfate 325 (65 FE) MG tablet Take 325 mg by mouth daily with breakfast.   Yes Historical Provider, MD  folic acid (FOLVITE) 1 MG tablet Take 1 mg by mouth daily.   Yes Historical Provider, MD  furosemide  (LASIX) 20 MG tablet Take 20 mg by mouth daily.   Yes Historical Provider, MD  hydrALAZINE (APRESOLINE) 50 MG tablet Take 50 mg by mouth See admin instructions. Take every 8 hours for hypertension - hold for SBP <110   Yes Historical Provider, MD  Hypromellose (GENTEAL MILD TO MODERATE OP) Place 1 drop into both eyes daily as needed (dry eyes).   Yes Historical Provider, MD  loratadine (CLARITIN) 10 MG tablet Take 10 mg by mouth daily as needed for allergies.   Yes Historical Provider, MD  potassium chloride SA (K-DUR,KLOR-CON) 20 MEQ tablet Take 20 mEq by mouth 2 (two) times daily. 8am and 4pm   Yes Historical Provider, MD  predniSONE (DELTASONE) 2.5 MG tablet Take 7.5 mg by mouth daily with breakfast.   Yes Historical Provider, MD  traMADol (ULTRAM) 50 MG tablet Take 1 tablet (50 mg total) by mouth 3 (three) times daily as needed for moderate pain. 04/14/14  Yes Georgann Housekeeper, MD  verapamil (CALAN-SR) 240 MG CR tablet Take 240 mg by mouth at bedtime.   Yes Historical Provider, MD   BP 159/76  Pulse 94  Temp(Src) 97.6 F (36.4 C) (Oral)  Resp 18  SpO2 98% Physical Exam  Nursing note and vitals reviewed. Constitutional: She is oriented to person, place, and time. She appears well-developed and well-nourished.  HENT:  Head: Normocephalic and atraumatic.  Eyes: Conjunctivae and EOM are normal. Right eye exhibits no discharge. Left eye exhibits no discharge.  Cardiovascular: Regular rhythm.  Bradycardia present.   Pulmonary/Chest: Effort normal and breath sounds normal. No respiratory distress.  Abdominal: Soft. She exhibits no distension. There is no tenderness. There is no rebound.  Musculoskeletal: Normal range of motion. She exhibits no edema and no tenderness.  Neurological: She is alert and oriented to person, place, and time.  Skin: Skin is warm and dry.    ED Course  Procedures (including critical care time) Labs Review Labs Reviewed  CBC - Abnormal; Notable for the following:     RBC 3.37 (*)    Hemoglobin 10.6 (*)    HCT 32.5 (*)    RDW 18.3 (*)    Platelets 140 (*)    All other components within normal limits  COMPREHENSIVE METABOLIC PANEL - Abnormal; Notable for the following:    Sodium 135 (*)    Potassium 6.3 (*)    Glucose, Bld 144 (*)    BUN 35 (*)    Creatinine, Ser 1.45 (*)    GFR calc non Af Amer 33 (*)    GFR calc Af Amer 38 (*)    All other components within normal limits  I-STAT CHEM 8, ED - Abnormal; Notable for the following:    Potassium 6.3 (*)    BUN 44 (*)    Creatinine, Ser 1.60 (*)  Glucose, Bld 143 (*)    All other components within normal limits  URINALYSIS, ROUTINE W REFLEX MICROSCOPIC  BASIC METABOLIC PANEL  TROPONIN I  I-STAT TROPOININ, ED  I-STAT CG4 LACTIC ACID, ED    Imaging Review Dg Chest 1 View  05/21/2014   CLINICAL DATA:  80 year old female with bradycardia.  EXAM: CHEST - 1 VIEW  COMPARISON:  04/10/2014 prior chest radiographs  FINDINGS: Cardiomegaly and mild pulmonary vascular congestion noted.  Defibrillator pads overlying the chest are noted.  A right PICC line is present with tip overlying the lower SVC.  There is no evidence of focal airspace disease, pulmonary edema, suspicious pulmonary nodule/mass, pleural effusion, or pneumothorax. No acute bony abnormalities are identified.  IMPRESSION: Cardiomegaly with mild pulmonary vascular congestion.   Electronically Signed   By: Laveda Abbe M.D.   On: 05/21/2014 20:27     EKG Interpretation None      MDM   Final diagnoses:  Bradycardia  Chronic diastolic CHF (congestive heart failure)  Hyperkalemia    80 yo F w/ h/o OA, CKD, HTN, CHF here for AMS, SOB and bradycardia relieved by atropine. Here patient without complaint. Still bradycardic, appears to be junctional with intermittent atrial beats v slow afib. BP stable. On verapamil and metoprolol at home, concern for possible over medications. Initial K was 6.3 but QRS not wide. Calcium given as it should work  for either etiology. Fluids given, insulin and d50. Patient appeared to go into a junctional rhythm when this happened and continued to be stable. Labs unremarkable aside from hyper k and AKI. Medicine called for admission and while medicine in room patient converted to faster rhythm still without obvious p waves. Tried calling cardiology multiple times and they did not answer pages.     Marily Memos, MD 05/22/14 1224

## 2014-05-21 NOTE — ED Notes (Addendum)
C/o L shoulder pain, sudden onset, family reported heavier breathing, EKG repeated, LS CTA, calcium complete, NS IVF bolus infusing (500cc over 1 hr). Sleeping/resting, arousable to voice, NAD, calm, no changes.

## 2014-05-21 NOTE — ED Notes (Signed)
EKG given to Dr. Zackowski  

## 2014-05-21 NOTE — ED Notes (Signed)
Dr. Clayborne Dana at [facility], speaking with pt & family x2 at [facility]. Pt alert, NAD, calm, interactive.

## 2014-05-21 NOTE — ED Notes (Signed)
Blood obtained. cxr complete. EMS zoll pads replaced to ED pads. No changes, VSS, pt alert, NAD, calm, interactive, talkative, speaking with family at [facility] x2.

## 2014-05-22 ENCOUNTER — Encounter (HOSPITAL_COMMUNITY): Payer: Self-pay | Admitting: General Practice

## 2014-05-22 DIAGNOSIS — I330 Acute and subacute infective endocarditis: Secondary | ICD-10-CM | POA: Diagnosis not present

## 2014-05-22 DIAGNOSIS — I498 Other specified cardiac arrhythmias: Principal | ICD-10-CM

## 2014-05-22 DIAGNOSIS — N179 Acute kidney failure, unspecified: Secondary | ICD-10-CM | POA: Diagnosis not present

## 2014-05-22 DIAGNOSIS — I5032 Chronic diastolic (congestive) heart failure: Secondary | ICD-10-CM | POA: Diagnosis not present

## 2014-05-22 LAB — BASIC METABOLIC PANEL
BUN: 32 mg/dL — AB (ref 6–23)
BUN: 24 mg/dL — AB (ref 6–23)
BUN: 22 mg/dL (ref 6–23)
BUN: 22 mg/dL (ref 6–23)
BUN: 20 mg/dL (ref 6–23)
CALCIUM: 9.9 mg/dL (ref 8.4–10.5)
CHLORIDE: 104 meq/L (ref 96–112)
CHLORIDE: 104 meq/L (ref 96–112)
CHLORIDE: 103 meq/L (ref 96–112)
CHLORIDE: 101 meq/L (ref 96–112)
CO2: 21 mEq/L (ref 19–32)
CO2: 21 mEq/L (ref 19–32)
CO2: 19 meq/L (ref 19–32)
CO2: 22 meq/L (ref 19–32)
CO2: 24 mEq/L (ref 19–32)
CREATININE: 1.18 mg/dL — AB (ref 0.50–1.10)
Calcium: 9.9 mg/dL (ref 8.4–10.5)
Calcium: 10 mg/dL (ref 8.4–10.5)
Calcium: 9.6 mg/dL (ref 8.4–10.5)
Calcium: 9.3 mg/dL (ref 8.4–10.5)
Chloride: 105 mEq/L (ref 96–112)
Creatinine, Ser: 0.95 mg/dL (ref 0.50–1.10)
Creatinine, Ser: 0.9 mg/dL (ref 0.50–1.10)
Creatinine, Ser: 1.08 mg/dL (ref 0.50–1.10)
Creatinine, Ser: 1.06 mg/dL (ref 0.50–1.10)
GFR calc Af Amer: 54 mL/min — ABNORMAL LOW (ref >90)
GFR calc Af Amer: 55 mL/min — ABNORMAL LOW (ref >90)
GFR calc non Af Amer: 54 mL/min — ABNORMAL LOW (ref >90)
GFR calc non Af Amer: 58 mL/min — ABNORMAL LOW (ref >90)
GFR calc non Af Amer: 46 mL/min — ABNORMAL LOW (ref >90)
GFR calc non Af Amer: 48 mL/min — ABNORMAL LOW (ref >90)
GFR, EST AFRICAN AMERICAN: 48 mL/min — AB (ref >90)
GFR, EST AFRICAN AMERICAN: 63 mL/min — AB (ref >90)
GFR, EST AFRICAN AMERICAN: 67 mL/min — AB (ref >90)
GFR, EST NON AFRICAN AMERICAN: 42 mL/min — AB (ref >90)
GLUCOSE: 130 mg/dL — AB (ref 70–99)
GLUCOSE: 113 mg/dL — AB (ref 70–99)
Glucose, Bld: 83 mg/dL (ref 70–99)
Glucose, Bld: 89 mg/dL (ref 70–99)
Glucose, Bld: 145 mg/dL — ABNORMAL HIGH (ref 70–99)
POTASSIUM: 5 meq/L (ref 3.7–5.3)
POTASSIUM: 4.9 meq/L (ref 3.7–5.3)
POTASSIUM: 5 meq/L (ref 3.7–5.3)
POTASSIUM: 4.5 meq/L (ref 3.7–5.3)
Potassium: 5.3 mEq/L (ref 3.7–5.3)
SODIUM: 140 meq/L (ref 137–147)
SODIUM: 139 meq/L (ref 137–147)
Sodium: 140 mEq/L (ref 137–147)
Sodium: 140 mEq/L (ref 137–147)
Sodium: 139 mEq/L (ref 137–147)

## 2014-05-22 LAB — URINALYSIS, ROUTINE W REFLEX MICROSCOPIC
Bilirubin Urine: NEGATIVE
Glucose, UA: NEGATIVE mg/dL
Hgb urine dipstick: NEGATIVE
Ketones, ur: NEGATIVE mg/dL
NITRITE: NEGATIVE
PH: 5.5 (ref 5.0–8.0)
PROTEIN: 30 mg/dL — AB
Specific Gravity, Urine: 1.018 (ref 1.005–1.030)
Urobilinogen, UA: 0.2 mg/dL (ref 0.0–1.0)

## 2014-05-22 LAB — CBC
HCT: 31.7 % — ABNORMAL LOW (ref 36.0–46.0)
HCT: 29.2 % — ABNORMAL LOW (ref 36.0–46.0)
Hemoglobin: 10.3 g/dL — ABNORMAL LOW (ref 12.0–15.0)
Hemoglobin: 9.5 g/dL — ABNORMAL LOW (ref 12.0–15.0)
MCH: 32 pg (ref 26.0–34.0)
MCH: 30.9 pg (ref 26.0–34.0)
MCHC: 32.5 g/dL (ref 30.0–36.0)
MCHC: 32.5 g/dL (ref 30.0–36.0)
MCV: 98.4 fL (ref 78.0–100.0)
MCV: 95.1 fL (ref 78.0–100.0)
PLATELETS: 136 10*3/uL — AB (ref 150–400)
PLATELETS: 150 10*3/uL (ref 150–400)
RBC: 3.22 MIL/uL — ABNORMAL LOW (ref 3.87–5.11)
RBC: 3.07 MIL/uL — AB (ref 3.87–5.11)
RDW: 18.2 % — AB (ref 11.5–15.5)
RDW: 18.2 % — AB (ref 11.5–15.5)
WBC: 8.2 10*3/uL (ref 4.0–10.5)
WBC: 8.3 10*3/uL (ref 4.0–10.5)

## 2014-05-22 LAB — COMPREHENSIVE METABOLIC PANEL
ALT: <5 U/L (ref 0–35)
AST: 24 U/L (ref 0–37)
Albumin: 3.4 g/dL — ABNORMAL LOW (ref 3.5–5.2)
Alkaline Phosphatase: 60 U/L (ref 39–117)
BILIRUBIN TOTAL: 0.5 mg/dL (ref 0.3–1.2)
BUN: 29 mg/dL — ABNORMAL HIGH (ref 6–23)
CO2: 20 meq/L (ref 19–32)
CREATININE: 1.07 mg/dL (ref 0.50–1.10)
Calcium: 9.7 mg/dL (ref 8.4–10.5)
Chloride: 100 mEq/L (ref 96–112)
GFR calc Af Amer: 54 mL/min — ABNORMAL LOW (ref >90)
GFR, EST NON AFRICAN AMERICAN: 47 mL/min — AB (ref >90)
Glucose, Bld: 87 mg/dL (ref 70–99)
Potassium: 5.4 mEq/L — ABNORMAL HIGH (ref 3.7–5.3)
Sodium: 137 mEq/L (ref 137–147)
Total Protein: 7.6 g/dL (ref 6.0–8.3)

## 2014-05-22 LAB — URINE MICROSCOPIC-ADD ON

## 2014-05-22 LAB — PHOSPHORUS: Phosphorus: 3.9 mg/dL (ref 2.3–4.6)

## 2014-05-22 LAB — TROPONIN I: Troponin I: <0.3 ng/mL (ref <0.30)

## 2014-05-22 LAB — MAGNESIUM: Magnesium: 2.3 mg/dL (ref 1.5–2.5)

## 2014-05-22 LAB — TSH: TSH: 1.34 u[IU]/mL (ref 0.350–4.500)

## 2014-05-22 LAB — MRSA PCR SCREENING: MRSA BY PCR: NEGATIVE

## 2014-05-22 MED ORDER — ONDANSETRON HCL 4 MG/2ML IJ SOLN
4.0000 mg | Freq: Four times a day (QID) | INTRAMUSCULAR | Status: DC | PRN
  Administered 2014-05-24: 4 mg via INTRAVENOUS
  Filled 2014-05-22: qty 2

## 2014-05-22 MED ORDER — ASPIRIN 81 MG PO CHEW
81.0000 mg | CHEWABLE_TABLET | Freq: Every day | ORAL | Status: DC
  Administered 2014-05-22 – 2014-05-24 (×3): 81 mg via ORAL
  Filled 2014-05-22 (×3): qty 1

## 2014-05-22 MED ORDER — ONDANSETRON HCL 4 MG PO TABS: 4.0000 mg | ORAL_TABLET | Freq: Four times a day (QID) | ORAL | Status: DC | PRN

## 2014-05-22 MED ORDER — HYDRALAZINE HCL 50 MG PO TABS
50.0000 mg | ORAL_TABLET | Freq: Three times a day (TID) | ORAL | Status: DC
  Administered 2014-05-22 – 2014-05-24 (×8): 50 mg via ORAL
  Filled 2014-05-22 (×11): qty 1

## 2014-05-22 MED ORDER — ALPRAZOLAM 0.25 MG PO TABS: 0.2500 mg | ORAL_TABLET | Freq: Two times a day (BID) | ORAL | Status: DC | PRN

## 2014-05-22 MED ORDER — ENOXAPARIN SODIUM 40 MG/0.4ML ~~LOC~~ SOLN
40.0000 mg | SUBCUTANEOUS | Status: DC
  Administered 2014-05-22 – 2014-05-24 (×3): 40 mg via SUBCUTANEOUS
  Filled 2014-05-22 (×4): qty 0.4

## 2014-05-22 MED ORDER — PREDNISONE 5 MG PO TABS
7.5000 mg | ORAL_TABLET | Freq: Every day | ORAL | Status: DC
  Administered 2014-05-22 – 2014-05-24 (×3): 7.5 mg via ORAL
  Filled 2014-05-22 (×4): qty 1

## 2014-05-22 MED ORDER — TRAMADOL HCL 50 MG PO TABS: 50.0000 mg | ORAL_TABLET | Freq: Three times a day (TID) | ORAL | Status: DC | PRN

## 2014-05-22 MED ORDER — CEFAZOLIN SODIUM-DEXTROSE 2-3 GM-% IV SOLR
2.0000 g | Freq: Three times a day (TID) | INTRAVENOUS | Status: DC
  Administered 2014-05-22 – 2014-05-24 (×8): 2 g via INTRAVENOUS
  Filled 2014-05-22 (×11): qty 50

## 2014-05-22 MED ORDER — FERROUS SULFATE 325 (65 FE) MG PO TABS
325.0000 mg | ORAL_TABLET | Freq: Every day | ORAL | Status: DC
  Administered 2014-05-22 – 2014-05-24 (×3): 325 mg via ORAL
  Filled 2014-05-22 (×4): qty 1

## 2014-05-22 MED ORDER — SODIUM CHLORIDE 0.9 % IJ SOLN
3.0000 mL | Freq: Two times a day (BID) | INTRAMUSCULAR | Status: DC
  Administered 2014-05-22 (×2): 3 mL via INTRAVENOUS

## 2014-05-22 MED ORDER — HYDROCODONE-ACETAMINOPHEN 5-325 MG PO TABS
1.0000 | ORAL_TABLET | ORAL | Status: DC | PRN
  Administered 2014-05-22: 1 via ORAL
  Administered 2014-05-23: 2 via ORAL
  Filled 2014-05-22 (×2): qty 2
  Filled 2014-05-22: qty 1

## 2014-05-22 MED ORDER — BIOTENE DRY MOUTH MT LIQD
15.0000 mL | Freq: Two times a day (BID) | OROMUCOSAL | Status: DC
  Administered 2014-05-22 – 2014-05-23 (×2): 15 mL via OROMUCOSAL

## 2014-05-22 MED ORDER — ALBUTEROL SULFATE (2.5 MG/3ML) 0.083% IN NEBU: 2.5000 mg | INHALATION_SOLUTION | Freq: Four times a day (QID) | RESPIRATORY_TRACT | Status: DC | PRN

## 2014-05-22 MED ORDER — LORATADINE 10 MG PO TABS
10.0000 mg | ORAL_TABLET | Freq: Every day | ORAL | Status: DC | PRN
  Filled 2014-05-22: qty 1

## 2014-05-22 MED ORDER — ALBUTEROL SULFATE HFA 108 (90 BASE) MCG/ACT IN AERS: 2.0000 | INHALATION_SPRAY | Freq: Four times a day (QID) | RESPIRATORY_TRACT | Status: DC | PRN

## 2014-05-22 MED ORDER — FOLIC ACID 1 MG PO TABS
1.0000 mg | ORAL_TABLET | Freq: Every day | ORAL | Status: DC
  Administered 2014-05-22 – 2014-05-24 (×3): 1 mg via ORAL
  Filled 2014-05-22 (×3): qty 1

## 2014-05-22 MED ORDER — ACETAMINOPHEN 650 MG RE SUPP: 650.0000 mg | Freq: Four times a day (QID) | RECTAL | Status: DC | PRN

## 2014-05-22 MED ORDER — DOCUSATE SODIUM 100 MG PO CAPS
100.0000 mg | ORAL_CAPSULE | Freq: Two times a day (BID) | ORAL | Status: DC
  Administered 2014-05-22 – 2014-05-24 (×4): 100 mg via ORAL
  Filled 2014-05-22 (×6): qty 1

## 2014-05-22 MED ORDER — FUROSEMIDE 20 MG PO TABS
20.0000 mg | ORAL_TABLET | Freq: Every day | ORAL | Status: DC
  Administered 2014-05-22: 20 mg via ORAL
  Filled 2014-05-22 (×2): qty 1

## 2014-05-22 MED ORDER — ACETAMINOPHEN 325 MG PO TABS: 650.0000 mg | ORAL_TABLET | Freq: Four times a day (QID) | ORAL | Status: DC | PRN

## 2014-05-22 NOTE — Progress Notes (Signed)
The patient's HR stayed in the 90s overnight.  Her VS are stable.  She did not receive any PRN medications.

## 2014-05-22 NOTE — ED Notes (Signed)
Pt moved to 2H 24, then taken to 3E 22 d/t bed assignment change, report re-given to new unit/RN. No changes, VSS, alert, NAD, calm, interactive, skin W&D, HR 98 NSR.

## 2014-05-22 NOTE — Progress Notes (Signed)
The patient arrived to 3E22 from the ED at 0100.  Bedside report was given.  The patient is A&Ox4 and does not have any complaints of pain at this time.  Her HR is in the 90s at this time.  She is ambulatory with assistance.  Her call bell was explained and placed within reach and her bed alarm was turned on.

## 2014-05-22 NOTE — Progress Notes (Signed)
Subjective: Pt feel better HR up to 90 Episode Bradycardia/  Objective: Vital signs in last 24 hours: Temp:  [97.6 F (36.4 C)-98.5 F (36.9 C)] 98.5 F (36.9 C) (06/20 0525) Pulse Rate:  [38-100] 91 (06/20 0525) Resp:  [12-24] 16 (06/20 0525) BP: (138-178)/(49-86) 142/85 mmHg (06/20 0525) SpO2:  [96 %-100 %] 99 % (06/20 0525) Weight:  [82.101 kg (181 lb)] 82.101 kg (181 lb) (06/20 0116) Weight change:  Last BM Date: 05/20/14  Intake/Output from previous day: 06/19 0701 - 06/20 0700 In: 950 [I.V.:450; IV Piggyback:500] Out: -  Intake/Output this shift:    General appearance: alert Resp: clear to auscultation bilaterally Cardio: regular rate and rhythm Extremities: extremities normal, atraumatic, no cyanosis or edema  Lab Results:  Recent Labs  05/22/14 0126 05/22/14 0357  WBC 8.2 8.3  HGB 10.3* 9.5*  HCT 31.7* 29.2*  PLT 136* 150   BMET  Recent Labs  05/22/14 0126 05/22/14 0357  NA 140 137  K 5.3 5.4*  CL 105 100  CO2 21 20  GLUCOSE 83 87  BUN 32* 29*  CREATININE 1.18* 1.07  CALCIUM 9.9 9.7    Studies/Results: Dg Chest 1 View  05/21/2014   CLINICAL DATA:  80 year old female with bradycardia.  EXAM: CHEST - 1 VIEW  COMPARISON:  04/10/2014 prior chest radiographs  FINDINGS: Cardiomegaly and mild pulmonary vascular congestion noted.  Defibrillator pads overlying the chest are noted.  A right PICC line is present with tip overlying the lower SVC.  There is no evidence of focal airspace disease, pulmonary edema, suspicious pulmonary nodule/mass, pleural effusion, or pneumothorax. No acute bony abnormalities are identified.  IMPRESSION: Cardiomegaly with mild pulmonary vascular congestion.   Electronically Signed   By: Laveda Abbe M.D.   On: 05/21/2014 20:27    Medications: I have reviewed the patient's current medications.  Assessment/Plan: . Bradycardia - hold verapmil, cardiology consulted. - HR off verapamil- in 90's : transient . Anemia - chronic at  baseline . HTN (hypertension) - continue hydralazine . Hyperkalemia - sp treatment with Ca and insulin in ER ; K better. HX of SBE/ bacteremia-  - cont ancef-  Total of 6 weeks ( end in 05/27/14)- PICC line H/o Diastolic HF- continie Lasix- off K CKD- Cr baseline.  LOS: 1 day   HUSAIN,KARRAR 05/22/2014, 9:11 AM

## 2014-05-22 NOTE — Consult Note (Signed)
CARDIOLOGY CONSULT NOTE  Patient ID: Monica Blankenship MRN: [id number] DOB/AGE: Dec 23, 1928 80 y.o.  Admit date: 05/21/2014 Primary Physician Georgann Housekeeper, MD Primary Cardiologist Dr. Mayford Knife Chief Complaint  Weakness  HPI:  The patient was sent from her rehab facility for evaluation of bradycardia and possible LOC.    The initial EKG that I see in the ED was junctional rhythm wth grouped beating.  It was not atrial fibrillation however as the computer reading suggested.  There are some rhythm strips with sinus bradycardia with AV dissociation.   A subsequent EKG showed a regular  junctional rhythm with rate in the 50s.  Following this she had sinus rhythm.  She was treated with atropine, calcium, fluids and D50.   She did have a spontaneous improvement in her HR suddenly during her ED treatement.  Her postassium was 6.3 initially.   Her creat was up and from baseline to 1.6.  The patient had a recent complicated hospital course around Mother's Day with sepsis probably related to methicillin sensitive staph bacterial endocarditis.  During that hospitalization there was a small mobile mass noted on the mitral valve. There was moderate mitral stenosis.  She had been discharged for rehab.  Her meds included verapamil and metoprolol.  She did see Dr. Mayford Knife back in late May and was doing OK.  She was on a low dose of diuretic.    She reports that she had been doing some rehab.  However, yesterday she was too weak.  She tried to stand up and was week.  She did not feel any palpitations and she had no LOC.  She denies any fevers or chills.  She had no chest pain.     Past Medical History  Diagnosis Date  . Allergy   . Anxiety   . Insomnia   . Osteoarthritis   . Anemia   . Fatigue   . Obesity   . Diastolic dysfunction   . CKD (chronic kidney disease)   . Mitral stenosis     with moderate MR on echo 4/14  . Aortic stenosis     mild by echo 4/14  . Aortic regurgitation     mild to moderate  by echo 4/14  . HTN (hypertension)   . Chronic diastolic CHF (congestive heart failure)     Past Surgical History  Procedure Laterality Date  . Eye surgery    . Abdominal hysterectomy    . Appendectomy    . Cataract extraction, bilateral    . Tee without cardioversion N/A 04/13/2014    Procedure: TRANSESOPHAGEAL ECHOCARDIOGRAM (TEE);  Surgeon: Vesta Mixer, MD;  Location: [facility] ENDOSCOPY;  Service: Cardiovascular;  Laterality: N/A;    Allergies  Allergen Reactions  . Fentanyl Other (See Comments)    confusion   Prescriptions prior to admission  Medication Sig Dispense Refill  . albuterol (PROVENTIL HFA;VENTOLIN HFA) 108 (90 BASE) MCG/ACT inhaler Inhale 2 puffs into the lungs every 6 (six) hours as needed for shortness of breath.      . ALPRAZolam (XANAX) 0.25 MG tablet Take 1 tablet (0.25 mg total) by mouth 2 (two) times daily as needed for anxiety.  30 tablet  0  . aspirin 81 MG chewable tablet Chew 81 mg by mouth daily.       Marland Kitchen ceFAZolin (ANCEF) 2-3 GM-% SOLR Inject 50 mLs (2 g total) into the vein every 8 (eight) hours.  2 mL  0  . cholecalciferol (VITAMIN D) 1000 UNITS tablet Take 1,000 Units  by mouth daily.      Marland Kitchen doxazosin (CARDURA) 8 MG tablet Take 8 mg by mouth at bedtime.      . ferrous sulfate 325 (65 FE) MG tablet Take 325 mg by mouth daily with breakfast.      . folic acid (FOLVITE) 1 MG tablet Take 1 mg by mouth daily.      . furosemide (LASIX) 20 MG tablet Take 20 mg by mouth daily.      . hydrALAZINE (APRESOLINE) 50 MG tablet Take 50 mg by mouth See admin instructions. Take every 8 hours for hypertension - hold for SBP <110      . Hypromellose (GENTEAL MILD TO MODERATE OP) Place 1 drop into both eyes daily as needed (dry eyes).      Marland Kitchen loratadine (CLARITIN) 10 MG tablet Take 10 mg by mouth daily as needed for allergies.      . potassium chloride SA (K-DUR,KLOR-CON) 20 MEQ tablet Take 20 mEq by mouth 2 (two) times daily. 8am and 4pm      . predniSONE (DELTASONE) 2.5 MG  tablet Take 7.5 mg by mouth daily with breakfast.      . traMADol (ULTRAM) 50 MG tablet Take 1 tablet (50 mg total) by mouth 3 (three) times daily as needed for moderate pain.  30 tablet  0  . verapamil (CALAN-SR) 240 MG CR tablet Take 240 mg by mouth at bedtime.       Family History  Problem Relation Age of Onset  . Hypertension Mother   . CVA Mother     History   Social History  . Marital Status: Married    Spouse Name: N/A    Number of Children: N/A  . Years of Education: N/A   Occupational History  . retired    Social History Main Topics  . Smoking status: Former Smoker -- 0.25 packs/day for 1 years  . Smokeless tobacco: Never Used     Comment: smoked 1-3 cigs/day in school for about a year some days only  . Alcohol Use: No  . Drug Use: No  . Sexual Activity: Not on file   Other Topics Concern  . Not on file   Social History Narrative  . No narrative on file     ROS:    As stated in the HPI and negative for all other systems.  Physical Exam: Blood pressure 118/89, pulse 99, temperature 98.5 F (36.9 C), temperature source Oral, resp. rate 16, height 5\' 3"  (1.6 m), weight 181 lb (82.101 kg), SpO2 100.00%.  GENERAL:  Well appearing HEENT:  Pupils equal round and reactive, fundi not visualized, oral mucosa unremarkable NECK:  No jugular venous distention, waveform within normal limits, carotid upstroke brisk and symmetric, no bruits, no thyromegaly LYMPHATICS:  No cervical, inguinal adenopathy LUNGS:  Clear to auscultation bilaterally BACK:  No CVA tenderness CHEST:  Unremarkable HEART:  PMI not displaced or sustained,S1 and S2 within normal limits, no S3, no S4, no clicks, no rubs, soft apical systolic murmur radiating out the aortic outflow tract, no diastolic murmurs ABD:  Flat, positive bowel sounds normal in frequency in pitch, no bruits, no rebound, no guarding, no midline pulsatile mass, no hepatomegaly, no splenomegaly EXT:  2 plus pulses throughout, no  edema, no cyanosis no clubbing SKIN:  No rashes no nodules NEURO:  Cranial nerves II through XII grossly intact, motor grossly intact throughout PSYCH:  Cognitively intact, oriented to person place and time   Labs: Lab Results  Component Value Date   BUN 24* 05/22/2014   Lab Results  Component Value Date   CREATININE 0.95 05/22/2014   Lab Results  Component Value Date   NA 140 05/22/2014   K 5.0 05/22/2014   CL 104 05/22/2014   CO2 21 05/22/2014   Lab Results  Component Value Date   TROPONINI <0.30 05/22/2014   Lab Results  Component Value Date   WBC 8.3 05/22/2014   HGB 9.5* 05/22/2014   HCT 29.2* 05/22/2014   MCV 95.1 05/22/2014   PLT 150 05/22/2014   No results found for this basename: CHOL,  HDL,  LDLCALC,  LDLDIRECT,  TRIG,  CHOLHDL   Lab Results  Component Value Date   ALT <5 05/22/2014   AST 24 05/22/2014   ALKPHOS 60 05/22/2014   BILITOT 0.5 05/22/2014     Radiology:     EKG:CXR: NSR, rate 93, LAD, IVCD, no acute ST T wave changes.  05/22/2014   ASSESSMENT AND PLAN:   BRADYCARDIA:  This is likely related to med and electrolyte disturbance with dehydration.  For now she will remain off of the Lasix, beta blocker and calcium channel blocker.  We will watch the HR and BP overnight to decide on the discharge meds.  No other cardiac work up will be indicated however.  DIASTOLIC HF:  She seems to be euvolemic.  No change in therapy is indicated.  Holding diuretic as above.  SBE:  She is completing her antibiotics as per ID direction.   SignedRollene Rotunda 05/22/2014, 1:30 PM

## 2014-05-22 NOTE — ED Notes (Signed)
Attempted report 

## 2014-05-23 LAB — BASIC METABOLIC PANEL
BUN: 18 mg/dL (ref 6–23)
CALCIUM: 9.6 mg/dL (ref 8.4–10.5)
CHLORIDE: 102 meq/L (ref 96–112)
CO2: 23 mEq/L (ref 19–32)
Creatinine, Ser: 0.94 mg/dL (ref 0.50–1.10)
GFR calc non Af Amer: 55 mL/min — ABNORMAL LOW (ref >90)
GFR, EST AFRICAN AMERICAN: 64 mL/min — AB (ref >90)
Glucose, Bld: 101 mg/dL — ABNORMAL HIGH (ref 70–99)
Potassium: 4 mEq/L (ref 3.7–5.3)
Sodium: 141 mEq/L (ref 137–147)

## 2014-05-23 MED ORDER — VERAPAMIL HCL ER 120 MG PO TBCR
120.0000 mg | EXTENDED_RELEASE_TABLET | Freq: Every day | ORAL | Status: DC
  Administered 2014-05-23 – 2014-05-24 (×2): 120 mg via ORAL
  Filled 2014-05-23 (×3): qty 1

## 2014-05-23 MED ORDER — FUROSEMIDE 20 MG PO TABS
10.0000 mg | ORAL_TABLET | Freq: Every day | ORAL | Status: DC
  Administered 2014-05-23 – 2014-05-24 (×2): 10 mg via ORAL
  Filled 2014-05-23 (×3): qty 0.5

## 2014-05-23 NOTE — Progress Notes (Signed)
SUBJECTIVE:  No distress.  Slept well.  No pain.    PHYSICAL EXAM Filed Vitals:   05/22/14 1356 05/22/14 2008 05/23/14 0000 05/23/14 0403  BP: 140/66 160/86 151/67 135/58  Pulse: 92 101 100 99  Temp: 98.3 F (36.8 C) 98.5 F (36.9 C) 98.7 F (37.1 C) 99.3 F (37.4 C)  TempSrc: Oral Oral Oral Oral  Resp: 18 18 18 18   Height:      Weight:    176 lb (79.833 kg)  SpO2: 95% 98% 95% 98%   General:  No distress Lungs:  clear Heart:  RRR Abdomen:  Positive bowel sounds, no rebound no guarding Extremities:  No edema  Neuro:  Nonfocal  LABS: Lab Results  Component Value Date   TROPONINI <0.30 05/22/2014   Results for orders placed during the hospital encounter of 05/21/14 (from the past 24 hour(s))  BASIC METABOLIC PANEL     Status: Abnormal   Collection Time    05/22/14  9:20 AM      Result Value Ref Range   Sodium 140  137 - 147 mEq/L   Potassium 5.0  3.7 - 5.3 mEq/L   Chloride 104  96 - 112 mEq/L   CO2 21  19 - 32 mEq/L   Glucose, Bld 89  70 - 99 mg/dL   BUN 24 (*) 6 - 23 mg/dL   Creatinine, Ser 05/24/14  0.50 - 1.10 mg/dL   Calcium 9.9  8.4 - 4.12 mg/dL   GFR calc non Af Amer 54 (*) >90 mL/min   GFR calc Af Amer 63 (*) >90 mL/min  BASIC METABOLIC PANEL     Status: Abnormal   Collection Time    05/22/14 12:45 PM      Result Value Ref Range   Sodium 139  137 - 147 mEq/L   Potassium 4.9  3.7 - 5.3 mEq/L   Chloride 104  96 - 112 mEq/L   CO2 19  19 - 32 mEq/L   Glucose, Bld 145 (*) 70 - 99 mg/dL   BUN 22  6 - 23 mg/dL   Creatinine, Ser 05/24/14  0.50 - 1.10 mg/dL   Calcium 6.76  8.4 - 72.0 mg/dL   GFR calc non Af Amer 58 (*) >90 mL/min   GFR calc Af Amer 67 (*) >90 mL/min  TROPONIN I     Status: None   Collection Time    05/22/14 12:45 PM      Result Value Ref Range   Troponin I <0.30  <0.30 ng/mL  BASIC METABOLIC PANEL     Status: Abnormal   Collection Time    05/22/14  4:13 PM      Result Value Ref Range   Sodium 140  137 - 147 mEq/L   Potassium 5.0  3.7 -  5.3 mEq/L   Chloride 103  96 - 112 mEq/L   CO2 22  19 - 32 mEq/L   Glucose, Bld 130 (*) 70 - 99 mg/dL   BUN 22  6 - 23 mg/dL   Creatinine, Ser 05/24/14  0.50 - 1.10 mg/dL   Calcium 9.6  8.4 - 0.96 mg/dL   GFR calc non Af Amer 46 (*) >90 mL/min   GFR calc Af Amer 54 (*) >90 mL/min  BASIC METABOLIC PANEL     Status: Abnormal   Collection Time    05/22/14  9:10 PM      Result Value Ref Range   Sodium 139  137 - 147  mEq/L   Potassium 4.5  3.7 - 5.3 mEq/L   Chloride 101  96 - 112 mEq/L   CO2 24  19 - 32 mEq/L   Glucose, Bld 113 (*) 70 - 99 mg/dL   BUN 20  6 - 23 mg/dL   Creatinine, Ser 2.80  0.50 - 1.10 mg/dL   Calcium 9.3  8.4 - 03.4 mg/dL   GFR calc non Af Amer 48 (*) >90 mL/min   GFR calc Af Amer 55 (*) >90 mL/min  BASIC METABOLIC PANEL     Status: Abnormal   Collection Time    05/23/14  3:35 AM      Result Value Ref Range   Sodium 141  137 - 147 mEq/L   Potassium 4.0  3.7 - 5.3 mEq/L   Chloride 102  96 - 112 mEq/L   CO2 23  19 - 32 mEq/L   Glucose, Bld 101 (*) 70 - 99 mg/dL   BUN 18  6 - 23 mg/dL   Creatinine, Ser 9.17  0.50 - 1.10 mg/dL   Calcium 9.6  8.4 - 91.5 mg/dL   GFR calc non Af Amer 55 (*) >90 mL/min   GFR calc Af Amer 64 (*) >90 mL/min    Intake/Output Summary (Last 24 hours) at 05/23/14 0720 Last data filed at 05/23/14 0415  Gross per 24 hour  Intake    620 ml  Output   2225 ml  Net  -1605 ml    ASSESSMENT AND PLAN:  BRADYCARDIA:  Heart rate is improved.  I will restart a lower dose of verapamil.  I cannot tell whether she had still been taking her beta blocker on admission.  However, with diastolic dysfunction we should try a lower dose of verapamil.  I would suggest an out patient Holter in about 1 week and I will send this message to Dr. Mayford Knife.  To her daughter's concerns I have carefully reviewed the March ED presentation.  She was dehydrated on a larger dose of Lasix and I have reviewed the May admission.    CHRONIC DIASTOLIC HF:  She seems to be  euvolemic.  I will change the Lasix to a lower dose for maintenance.  She seems to be sensitive to the diuretic but has not done well off of it.  She needs daily weights and follow up of her BMET.   SBE:  Plan per ID.     Rollene Rotunda 05/23/2014 7:20 AM

## 2014-05-23 NOTE — Progress Notes (Addendum)
Patient alert and oriented x4 throughout shift, vital signs stable.  Patient's daughter requested that MD call her with updates; MD made aware when at bedside this AM.  Dressing change orders placed by [facility] nurse, materials ordered and will change dressing when materials available.  Patient denies any questions or concerns at this time.  Will continue to monitor.

## 2014-05-23 NOTE — Consult Note (Signed)
WOC wound consult note Reason for Consult: Full thickness wound to right buttock at gluteal cleft.  Sustained when patient fell in bathtub and upon rising, hit buttocks on tub faucet. Wound type:Traumatic Pressure Ulcer POA: No Measurement: 0.8cm x 2.5cm x 0.4cm  Wound URK:YHCW, moist and granulating Drainage (amount, consistency, odor) scant serous Periwound:intact, clear Dressing procedure/placement/frequency: I will implement a POC aimed at filling the defect and maintaining a moist (but not wet) environment conducive to wound healing.  Fill defect with a small piece of calcium alginate and top with a thin film dressing with twice weekly and PRN changes.  She will require the assistance of a [facility] for this as she would be unable to dress it herself.  If you agree, please order. Additionally, a pressure redistribution chair pad is provided for her comfort. WOC nursing team will not follow, but will remain available to this patient, the nursing and medical teams.  Please re-consult if needed. Thanks, Ladona Mow, MSN, RN, GNP, [place], CWON-AP [phone])

## 2014-05-23 NOTE — Progress Notes (Signed)
Subjective: Pt with HR  Better No braydcardia  Objective: Vital signs in last 24 hours: Temp:  [98.3 F (36.8 C)-99.3 F (37.4 C)] 99.3 F (37.4 C) (06/21 0403) Pulse Rate:  [92-101] 99 (06/21 0403) Resp:  [16-18] 18 (06/21 0403) BP: (118-160)/(58-89) 135/58 mmHg (06/21 0403) SpO2:  [95 %-100 %] 98 % (06/21 0403) Weight:  [79.833 kg (176 lb)] 79.833 kg (176 lb) (06/21 0403) Weight change: -2.268 kg (-5 lb) Last BM Date: 05/22/14  Intake/Output from previous day: 06/20 0701 - 06/21 0700 In: 620 [P.O.:420; IV Piggyback:200] Out: 2225 [Urine:2225] Intake/Output this shift:    General appearance: alert Resp: clear to auscultation bilaterally Cardio: regular rate and rhythm  Lab Results:  Recent Labs  05/22/14 0126 05/22/14 0357  WBC 8.2 8.3  HGB 10.3* 9.5*  HCT 31.7* 29.2*  PLT 136* 150   BMET  Recent Labs  05/22/14 2110 05/23/14 0335  NA 139 141  K 4.5 4.0  CL 101 102  CO2 24 23  GLUCOSE 113* 101*  BUN 20 18  CREATININE 1.06 0.94  CALCIUM 9.3 9.6    Studies/Results: Dg Chest 1 View  05/21/2014   CLINICAL DATA:  80 year old female with bradycardia.  EXAM: CHEST - 1 VIEW  COMPARISON:  04/10/2014 prior chest radiographs  FINDINGS: Cardiomegaly and mild pulmonary vascular congestion noted.  Defibrillator pads overlying the chest are noted.  A right PICC line is present with tip overlying the lower SVC.  There is no evidence of focal airspace disease, pulmonary edema, suspicious pulmonary nodule/mass, pleural effusion, or pneumothorax. No acute bony abnormalities are identified.  IMPRESSION: Cardiomegaly with mild pulmonary vascular congestion.   Electronically Signed   By: Laveda Abbe M.D.   On: 05/21/2014 20:27    Medications: I have reviewed the patient's current medications.  Assessment/Plan: Bradycardia - improved - cardiology noted- low dose verapamil out pt Holter monitor . Anemia - chronic at baseline . HTN (hypertension) - continue hydralazine .  Hyperkalemia - sp treatment with Ca and insulin in ER ; K better.  HX of SBE/ bacteremia- - cont ancef- Total of 6 weeks ( end in 05/27/14)- PICC line  H/o Diastolic HF- continie Lasix- low dose- off K  CKD- Cr baseline.  buttock wound from prev admission- had I/D- wound care consult. socail : ok to return to SNF tomorrow  LOS: 2 days   Monica Blankenship 05/23/2014, 9:35 AM

## 2014-05-24 ENCOUNTER — Telehealth: Payer: Self-pay | Admitting: General Surgery

## 2014-05-24 ENCOUNTER — Inpatient Hospital Stay: Payer: Medicare PPO | Admitting: Infectious Diseases

## 2014-05-24 ENCOUNTER — Telehealth: Payer: Self-pay | Admitting: Cardiology

## 2014-05-24 DIAGNOSIS — R001 Bradycardia, unspecified: Secondary | ICD-10-CM

## 2014-05-24 MED ORDER — ALPRAZOLAM 0.25 MG PO TABS: 0.2500 mg | ORAL_TABLET | Freq: Two times a day (BID) | ORAL | Status: DC | PRN

## 2014-05-24 MED ORDER — VERAPAMIL HCL ER 120 MG PO TBCR: 120.0000 mg | EXTENDED_RELEASE_TABLET | Freq: Every day | ORAL | Status: DC

## 2014-05-24 MED ORDER — FUROSEMIDE 20 MG PO TABS: 10.0000 mg | ORAL_TABLET | Freq: Every day | ORAL | Status: DC

## 2014-05-24 MED ORDER — TRAMADOL HCL 50 MG PO TABS: 50.0000 mg | ORAL_TABLET | Freq: Three times a day (TID) | ORAL | Status: DC | PRN

## 2014-05-24 NOTE — Discharge Summary (Signed)
Physician Discharge Summary  Patient ID: Monica Blankenship MRN: [id number] DOB/AGE: 09-Jul-1929 80 y.o.  Admit date: 05/21/2014 Discharge date: 05/24/2014  Admission Diagnoses:  Discharge Diagnoses:  Active Problems:   Anemia   HTN (hypertension)   Bradycardia   Syncope   Hyperkalemia History of bacterial endocarditis on antibiotics- Ancef- till 05/26/14 History of  Diastolic heart failure Weakness and deconditioning Buttock - wound- abscess i&D - last month-90% healed Discharged Condition: good  Hospital Course: 80 years old female admitted from the nursing facility where she was getting the rehabilitation after her illness with bacteremia and endocarditis on prolonged IV antibiotics as well as abscess of the buttock with wound improving with dressings, found to have question of syncopal episode found to have bradycardia in the heart rate in the low 30s over 40s mild dehydration and hyperkalemia. Problem #1: Bradycardia arrhythmia. Patient's verapamil was held in the ER initially she was in the 40s transiently went up to the 80s and 90s after stopping the medication she was monitored on telemetry remained in the normal sinus rhythm cardiac markers negative. Heart regular and 90s restarted on low dose of verapamil. Maintain a heart rate in the 80s chest x-ray negative. Cardiology was consulted she will need outpatient cardiac monitor. Problem #2: Hyperkalemia initial potassium of 6.3 received treatment in the emergency room with improvement in her potassium her potassium supplement was discontinued, potassium levels in the 4. Problem #3 mild dehydration with acute or chronic kidney failure with creatinine of 1.6 receive some IV fluids creatinine down to 0.9. Her Lasix was decreased Problem #4: History of bacteremia with endocarditis currently on IV antibiotics since last month, continue with the antibiotic Ancef through right PICC line. She'll need antibiotic for another one week. Problem #5:  Abscess on the buttock from last month wound care was consulted continue to improve continue with the dressing changes ; is about 90% healed from last month. Problem #6: Anemia chronic stable hemoglobin in the 9-10 range-continue iron supplement Problem #7: Hypertension monitor blood pressure decreasing the verapamil and the Lasix continue on Cardura and hydralazine. Problem #8 weakness and deconditioning continue physical therapy and rehabilitation. Problem #9: history of rheumatoid arthritis- continue chronic prednisone  Consults: cardiology  Significant Diagnostic Studies: labs: Normal WBC hemoglobin 9.3, potassium at discharge 4.0, creatinine 0.9 troponins negative TSH 1.3 urinalysis negative and radiology: CXR: normal  Treatments: IV hydration and antibiotics: Ancef  Discharge Exam: Blood pressure 160/53, pulse 78, temperature 98 F (36.7 C), temperature source Oral, resp. rate 20, height 5\' 3"  (1.6 m), weight 76.885 kg (169 lb 8 oz), SpO2 98.00%. General appearance: alert Resp: clear to auscultation bilaterally Cardio: regular rate and rhythm Incision/Wound: buttock wound much improved  Disposition: 03-Skilled Nursing Facility  Discharge Instructions   Diet - low sodium heart healthy    Complete by:  As directed      Discharge instructions    Complete by:  As directed   Wound care to buttock per wound care recommedation     Increase activity slowly    Complete by:  As directed             Medication List    STOP taking these medications       potassium chloride SA 20 MEQ tablet  Commonly known as:  K-DUR,KLOR-CON      TAKE these medications       albuterol 108 (90 BASE) MCG/ACT inhaler  Commonly known as:  PROVENTIL HFA;VENTOLIN HFA  Inhale 2 puffs into the  lungs every 6 (six) hours as needed for shortness of breath.     ALPRAZolam 0.25 MG tablet  Commonly known as:  XANAX  Take 1 tablet (0.25 mg total) by mouth 2 (two) times daily as needed for anxiety.      aspirin 81 MG chewable tablet  Chew 81 mg by mouth daily.     ceFAZolin 2-3 GM-% Solr  Commonly known as:  ANCEF  Inject 50 mLs (2 g total) into the vein every 8 (eight) hours.     cholecalciferol 1000 UNITS tablet  Commonly known as:  VITAMIN D  Take 1,000 Units by mouth daily.     doxazosin 8 MG tablet  Commonly known as:  CARDURA  Take 8 mg by mouth at bedtime.     ferrous sulfate 325 (65 FE) MG tablet  Take 325 mg by mouth daily with breakfast.     folic acid 1 MG tablet  Commonly known as:  FOLVITE  Take 1 mg by mouth daily.     furosemide 20 MG tablet  Commonly known as:  LASIX  Take 0.5 tablets (10 mg total) by mouth daily.     GENTEAL MILD TO MODERATE OP  Place 1 drop into both eyes daily as needed (dry eyes).     hydrALAZINE 50 MG tablet  Commonly known as:  APRESOLINE  Take 50 mg by mouth See admin instructions. Take every 8 hours for hypertension - hold for SBP <110     loratadine 10 MG tablet  Commonly known as:  CLARITIN  Take 10 mg by mouth daily as needed for allergies.     predniSONE 2.5 MG tablet  Commonly known as:  DELTASONE  Take 7.5 mg by mouth daily with breakfast.     traMADol 50 MG tablet  Commonly known as:  ULTRAM  Take 1 tablet (50 mg total) by mouth 3 (three) times daily as needed for moderate pain.     verapamil 120 MG CR tablet  Commonly known as:  CALAN-SR  Take 1 tablet (120 mg total) by mouth daily.       discharge time total: 40 min.  SignedGeorgann Housekeeper 05/24/2014, 7:41 AM

## 2014-05-24 NOTE — Care Management Note (Signed)
    Page 1 of 1   05/24/2014     2:38:58 PM CARE MANAGEMENT NOTE 05/24/2014  Patient:  [facility]   Account Number:  [id number]  Date Initiated:  05/24/2014  Documentation initiated by:  [facility]  Subjective/Objective Assessment:   Acute on chronic hypoxic respiratory failure secondary to mild URI and possibly mild acute on chronic systolic heart failure.//  [facility]     Action/Plan:   hold verapmil, cardiology consult in. Check TSH, cycle CE.// Return to [facility]   Anticipated DC Date:  05/24/2014   Anticipated DC Plan:  SKILLED NURSING FACILITY  In-house referral  Clinical Social Worker      DC Planning Services  CM consult      Choice offered to / List presented to:             Status of service:  Completed, signed off Medicare Important Message given?  YES (If response is "NO", the following Medicare IM given date fields will be blank) Date Medicare IM given:  05/24/2014 Date Additional Medicare IM given:    Discharge Disposition:  SKILLED NURSING FACILITY  Per UR Regulation:  Reviewed for med. necessity/level of care/duration of stay  If discussed at Long Length of Stay Meetings, dates discussed:    Comments:  05/24/14 1045 Camellia Wood, RN, BSN, [place] [phone] Pt to return to SNF today.

## 2014-05-24 NOTE — Telephone Encounter (Signed)
Waiting on response from Dr Mayford Knife

## 2014-05-24 NOTE — Progress Notes (Signed)
Report called to Rene Kocher, nurse at [facility] SN[facility]. Pt will return there this evening for continued care, receiving IV antibiotics.

## 2014-05-24 NOTE — Telephone Encounter (Signed)
daughtes said she was being discharged from [facility] today and going back to [facility] in [place] and would not be able to come for the appointment. Pt daughter stated she did not know how long she would be in rehab and they would not be able to bring her to get the monitor as long as she is in rehab.

## 2014-05-24 NOTE — Telephone Encounter (Signed)
Please let this patient up for a 24 hr Holter in a week for bradycardia   Order put into EPIC

## 2014-05-24 NOTE — Progress Notes (Signed)
D/c instructions reviewed with pt. Copy of instructions placed in packet of information for SNF. Pt waiting for transportation to facility. SW has arranged for ambulance transport. PICC line has been prepared for discharge use at facility. Pt waiting for transportation to arrive.

## 2014-05-24 NOTE — Evaluation (Signed)
Physical Therapy Evaluation Patient Details Name: Monica Blankenship MRN: [id number] DOB: October 01, 1929 Today's Date: 05/24/2014   History of Present Illness  Pt is an 80 y/o female admitted from [facility] with bradycardia and syncope. Pt had been receiving rehab after recent admission for bacteremia, endocarditis with sacral wound. Worth noting: pt has limited help at home -- husband is wheelchair bound with CA, and son is schizophrenic per pt; Daughter works long hours  Clinical Impression  Pt moving well and reports still feeling week and wanting to return to SNF to finish her rehab and IV antibiotics so that she will be as independent as possible on return home due to limited caregiver assist. Pt will benefit from acute therapy to maximize mobility and gait as well as continued SNF to have pt achieve mod I level overall before return home.     Follow Up Recommendations SNF    Equipment Recommendations  None recommended by PT    Recommendations for Other Services       Precautions / Restrictions Precautions Precautions: Fall      Mobility  Bed Mobility               General bed mobility comments: pt in recliner on arrival  Transfers Overall transfer level: Modified independent                  Ambulation/Gait Ambulation/Gait assistance: Supervision Ambulation Distance (Feet): 150 Feet Assistive device: Rolling walker (2 wheeled) Gait Pattern/deviations: Step-through pattern;Decreased stride length   Gait velocity interpretation: at or above normal speed for age/gender General Gait Details: pt moving well with cues for position in RW x 1  Stairs            Wheelchair Mobility    Modified Rankin (Stroke Patients Only)       Balance                                             Pertinent Vitals/Pain No pain, 2L    Home Living Family/patient expects to be discharged to:: Skilled nursing facility                     Prior Function Level of Independence: Needs assistance   Gait / Transfers Assistance Needed: cane normally and walking at facility with supervision without assist for transfers  ADL's / Homemaking Assistance Needed: pt has been working with staff on performing ADLs independently        Hand Dominance        Extremity/Trunk Assessment   Upper Extremity Assessment: Defer to OT evaluation           Lower Extremity Assessment: Overall WFL for tasks assessed      Cervical / Trunk Assessment: Normal  Communication   Communication: No difficulties  Cognition Arousal/Alertness: Awake/alert Behavior During Therapy: WFL for tasks assessed/performed Overall Cognitive Status: Within Functional Limits for tasks assessed                      General Comments      Exercises        Assessment/Plan    PT Assessment Patient needs continued PT services  PT Diagnosis Difficulty walking   PT Problem List Decreased activity tolerance;Decreased mobility;Decreased knowledge of use of DME  PT Treatment Interventions Gait training;DME instruction;Functional mobility training;Therapeutic activities;Patient/family education  PT Goals (Current goals can be found in the Care Plan section) Acute Rehab PT Goals Patient Stated Goal: finish my time at [facility] so I can go home PT Goal Formulation: With patient Time For Goal Achievement: 05/31/14 Potential to Achieve Goals: Good    Frequency Min 3X/week   Barriers to discharge        Co-evaluation               End of Session   Activity Tolerance: Patient tolerated treatment well Patient left: in chair;with call bell/phone within reach;with chair alarm set Nurse Communication: Mobility status         Time: 7341-9379 PT Time Calculation (min): 18 min   Charges:   PT Evaluation $Initial PT Evaluation Tier I: 1 Procedure     PT G CodesDelorse Blankenship 05/24/2014, 12:58 PM Monica Blankenship, PT [phone]

## 2014-05-24 NOTE — Progress Notes (Signed)
Utilization review completed. Bertha Stanfill, RN, BSN. 

## 2014-05-24 NOTE — Evaluation (Signed)
Occupational Therapy Evaluation Patient Details Name: Monica Blankenship MRN: [id number] DOB: January 04, 1929 Today's Date: 05/24/2014    History of Present Illness Pt is an 80 y/o female admitted from [facility] with bradycardia and syncope. Pt had been receiving rehab after recent admission for bacteremia, endocarditis with sacral wound. Worth noting: pt has limited help at home -- husband is wheelchair bound with CA, and son is schizophrenic per pt; Daughter works long hours   Clinical Impression   PT admitted with bradycardia and syncope.. Pt currently with functional limitiations due to the deficits listed below (see OT problem list). Admitted from SNF. Pt will benefit from skilled OT to increase their independence and safety with adls and balance to allow discharge SNF.     Follow Up Recommendations  SNF    Equipment Recommendations  Other (comment) (defer SNF)    Recommendations for Other Services       Precautions / Restrictions Precautions Precautions: Fall      Mobility Bed Mobility Overal bed mobility: Modified Independent             General bed mobility comments: pt in recliner on arrival  Transfers Overall transfer level: Needs assistance   Transfers: Sit to/from Stand Sit to Stand: Min guard              Balance                                            ADL Overall ADL's : Needs assistance/impaired                         Toilet Transfer: Min guard;Ambulation Toilet Transfer Details (indicate cue type and reason): stand pivot         Functional mobility during ADLs: Min guard General ADL Comments: Pt supine on arrival and agreeable to OOB for lunch. Pt complete bed mobility and transer.     Vision                     Perception     Praxis      Pertinent Vitals/Pain VSS     Hand Dominance Left   Extremity/Trunk Assessment Upper Extremity Assessment Upper Extremity Assessment: Overall  WFL for tasks assessed   Lower Extremity Assessment Lower Extremity Assessment: Defer to PT evaluation   Cervical / Trunk Assessment Cervical / Trunk Assessment: Normal   Communication Communication Communication: No difficulties   Cognition Arousal/Alertness: Awake/alert Behavior During Therapy: WFL for tasks assessed/performed Overall Cognitive Status: Within Functional Limits for tasks assessed                     General Comments       Exercises       Shoulder Instructions      Home Living Family/patient expects to be discharged to:: Skilled nursing facility                                        Prior Functioning/Environment Level of Independence: Needs assistance  Gait / Transfers Assistance Needed: cane normally and walking at facility with supervision without assist for transfers ADL's / Homemaking Assistance Needed: pt has been working with staff on performing ADLs independently  OT Diagnosis:     OT Problem List:     OT Treatment/Interventions:      OT Goals(Current goals can be found in the care plan section) Acute Rehab OT Goals Patient Stated Goal: to finish rehab  OT Frequency:     Barriers to D/C:            Co-evaluation              End of Session Nurse Communication: Mobility status  Activity Tolerance: Patient tolerated treatment well Patient left: in chair;with call bell/phone within reach   Time: 8453-6468 OT Time Calculation (min): 10 min Charges:  OT General Charges $OT Visit: 1 Procedure OT Evaluation $Initial OT Evaluation Tier I: 1 Procedure G-Codes:    Boone Master B 2014/06/23, 1:16 PM Pager: [phone]

## 2014-05-25 NOTE — Telephone Encounter (Signed)
(  [phone] is the number

## 2014-05-25 NOTE — Clinical Social Work Psychosocial (Addendum)
    Clinical Social Work Department BRIEF PSYCHOSOCIAL ASSESSMENT 05/22/2014  Patient:  Monica Blankenship, Monica Blankenship     Account Number:  [id number]     Admit date:  05/21/2014  Clinical Social Worker:  Tiburcio Pea  Date/Time:  05/22/2014 01:40 PM  Referred by:  Physician  Date Referred:  05/22/2014 Referred for  Other - See comment   Other Referral:   Return to SNF   Interview type:  Other - See comment Other interview type:   Patient and daughter- Monica Blankenship    PSYCHOSOCIAL DATA Living Status:  FACILITY Admitted from facility:  [facility] Level of care:  Skilled Nursing Facility Primary support name:  Monica Blankenship Primary support relationship to patient:  CHILD, ADULT Degree of support available:   Strong support    CURRENT CONCERNS Current Concerns  Other - See comment   Other Concerns:   Return to [facility]. Will require [employer] Authorization    SOCIAL WORK ASSESSMENT / PLAN 80 year old female- resident of [facility] where she is currently undergoing rehab for return home.  Patient is alert and oriented; wants to return to [facility]. She requested that CSW contact her daughter Monica Blankenship to facilitate. Patient will require [employer] authorization (commercial- not silverback). CSW spoke to [place] at [facility] and Berkley Harvey process will be initiated. Awaiting PT/OT evals to send to facility. MD notified. Fl2 placed on chart for MD's signature.   Assessment/plan status:  Psychosocial Support/Ongoing Assessment of Needs Other assessment/ plan:   Information/referral to community resources:   Discussed [employer] process and need to receive authorization prior to return to facility    PATIENT'S/FAMILY'S RESPONSE TO PLAN OF CARE: Patient was very pleasant to talk to and exhibited a postiive attitude and demeanor. She stated that she is happy at [facility] but hopes to return home as soon as possible. She is agreeable to return to SNF for continue rehab.  Daughter is also in agreement with this d/c plan and asked to be notified once Berkley Harvey is obtained. CSW will assist with d/c to SNF when medically stable.

## 2014-05-25 NOTE — Telephone Encounter (Signed)
Please see if rehab can arrange for her to come to office to get monitor

## 2014-05-25 NOTE — Progress Notes (Signed)
Clinical Social Worker facilitated patient discharge including contacting patient family (daughterThayer Ohm) and facility to confirm patient discharge plans.  Clinical information faxed to facility and family agreeable with plan. CSW arranged ambulance transport via [facility] to . RN to call report prior to discharge. Patient returned to [facility] for continued SNF care.  [employer] authorization has been received.   Clinical Social Worker will sign off for now as social work intervention is no longer needed. Please consult [place] again if new need arises.   Lovette Cliche, LCSWA  [phone]

## 2014-05-26 ENCOUNTER — Other Ambulatory Visit: Payer: Self-pay | Admitting: General Surgery

## 2014-05-26 DIAGNOSIS — R001 Bradycardia, unspecified: Secondary | ICD-10-CM

## 2014-05-26 NOTE — Telephone Encounter (Signed)
Nurse stated she had no problem setting pt up for monitor. Monica Blankenship is going to call and schedule.Marland Kitchen

## 2014-06-01 ENCOUNTER — Telehealth: Payer: Self-pay | Admitting: Cardiology

## 2014-06-01 NOTE — Telephone Encounter (Signed)
Follow up     Pt's daughter called for pt's test results.    Please give her a call back.

## 2014-06-02 NOTE — Telephone Encounter (Signed)
Pt is aware that we have the monitor but she needs to the results bc her mom has been discharged and she is on a lot of meds per daughter and she said she it very disoriented. She will not be able to be reached from 930-10 due to her own personal Dr appt. Call after to go over monitor. Pts daughters name is [name].

## 2014-06-02 NOTE — Telephone Encounter (Signed)
Dr Sherlyn Lick had these results in your pile to review. Could you let me know results so I can let daughter know?

## 2014-06-02 NOTE — Telephone Encounter (Signed)
Pt is aware.  

## 2014-06-02 NOTE — Telephone Encounter (Signed)
Follow up  ° ° °Calling back for test results.   °

## 2014-06-02 NOTE — Telephone Encounter (Signed)
Heart monitor showed NSR with occasional PAC's single and up to 5 in a row and rare PVC's which are benign

## 2014-06-17 ENCOUNTER — Ambulatory Visit
Admission: RE | Admit: 2014-06-17 | Discharge: 2014-06-17 | Disposition: A | Discharge status: Home / Self Care | Payer: Medicare PPO | Source: Ambulatory Visit | Attending: Nurse Practitioner | Admitting: Nurse Practitioner

## 2014-06-17 ENCOUNTER — Other Ambulatory Visit: Payer: Self-pay | Admitting: Nurse Practitioner

## 2014-06-17 DIAGNOSIS — I5030 Unspecified diastolic (congestive) heart failure: Secondary | ICD-10-CM

## 2014-06-30 ENCOUNTER — Encounter: Payer: Self-pay | Admitting: Internal Medicine

## 2014-06-30 ENCOUNTER — Ambulatory Visit (INDEPENDENT_AMBULATORY_CARE_PROVIDER_SITE_OTHER): Payer: Medicare PPO | Admitting: Internal Medicine

## 2014-06-30 VITALS — BP 169/71 | HR 84 | Temp 98.2°F | Wt 177.0 lb

## 2014-06-30 DIAGNOSIS — I330 Acute and subacute infective endocarditis: Secondary | ICD-10-CM

## 2014-06-30 DIAGNOSIS — A4901 Methicillin susceptible Staphylococcus aureus infection, unspecified site: Secondary | ICD-10-CM

## 2014-07-05 ENCOUNTER — Ambulatory Visit (HOSPITAL_COMMUNITY): Payer: Medicare PPO | Attending: Cardiology | Admitting: Cardiology

## 2014-07-05 DIAGNOSIS — I059 Rheumatic mitral valve disease, unspecified: Secondary | ICD-10-CM | POA: Insufficient documentation

## 2014-07-05 DIAGNOSIS — I350 Nonrheumatic aortic (valve) stenosis: Secondary | ICD-10-CM

## 2014-07-05 DIAGNOSIS — I330 Acute and subacute infective endocarditis: Secondary | ICD-10-CM

## 2014-07-05 DIAGNOSIS — I359 Nonrheumatic aortic valve disorder, unspecified: Secondary | ICD-10-CM

## 2014-07-05 DIAGNOSIS — I050 Rheumatic mitral stenosis: Secondary | ICD-10-CM

## 2014-07-05 DIAGNOSIS — I351 Nonrheumatic aortic (valve) insufficiency: Secondary | ICD-10-CM

## 2014-07-05 NOTE — Progress Notes (Signed)
Echo performed. 

## 2014-07-07 ENCOUNTER — Other Ambulatory Visit: Payer: Self-pay

## 2014-07-07 DIAGNOSIS — Z1231 Encounter for screening mammogram for malignant neoplasm of breast: Secondary | ICD-10-CM

## 2014-07-13 NOTE — Progress Notes (Signed)
Subjective:    Patient ID: Monica Blankenship, female    DOB: 1929/03/11, 80 y.o.   MRN: [id number]  HPI Monica Blankenship is an 80yo F with multiple medical problems including RA, pHTN, COD who hospitalized from 5/6-5/12 for sepsis due to invasive MSSA infection with bacteremia, + urine culture and gluteal abscess with + cx for MSSA. She underwent TEE that showed small vegetation on mitral valve, thus she was discharged on 6 wks of cefazolin with day#1 on 5/9. She presents now after she has finished her Iv antibiotics doing well. No fevers, chills, diarrhea, rash, sob, or doe. She has upcoming repeat echocardiogram appointment  Allergies  Allergen Reactions  . Fentanyl Other (See Comments)    confusion   Current Outpatient Prescriptions on File Prior to Visit  Medication Sig Dispense Refill  . albuterol (PROVENTIL HFA;VENTOLIN HFA) 108 (90 BASE) MCG/ACT inhaler Inhale 2 puffs into the lungs every 6 (six) hours as needed for shortness of breath.      . ALPRAZolam (XANAX) 0.25 MG tablet Take 1 tablet (0.25 mg total) by mouth 2 (two) times daily as needed for anxiety.  30 tablet  0  . aspirin 81 MG chewable tablet Chew 81 mg by mouth daily.       . cholecalciferol (VITAMIN D) 1000 UNITS tablet Take 1,000 Units by mouth daily.      Marland Kitchen doxazosin (CARDURA) 8 MG tablet Take 8 mg by mouth at bedtime.      . ferrous sulfate 325 (65 FE) MG tablet Take 325 mg by mouth daily with breakfast.      . folic acid (FOLVITE) 1 MG tablet Take 1 mg by mouth daily.      . furosemide (LASIX) 20 MG tablet Take 0.5 tablets (10 mg total) by mouth daily.  30 tablet  0  . hydrALAZINE (APRESOLINE) 50 MG tablet Take 50 mg by mouth See admin instructions. Take every 8 hours for hypertension - hold for SBP <110      . Hypromellose (GENTEAL MILD TO MODERATE OP) Place 1 drop into both eyes daily as needed (dry eyes).      Marland Kitchen loratadine (CLARITIN) 10 MG tablet Take 10 mg by mouth daily as needed for allergies.      . predniSONE  (DELTASONE) 2.5 MG tablet Take 7.5 mg by mouth daily with breakfast.      . traMADol (ULTRAM) 50 MG tablet Take 1 tablet (50 mg total) by mouth 3 (three) times daily as needed for moderate pain.  30 tablet  0  . verapamil (CALAN-SR) 120 MG CR tablet Take 1 tablet (120 mg total) by mouth daily.  30 tablet  3  . ceFAZolin (ANCEF) 2-3 GM-% SOLR Inject 50 mLs (2 g total) into the vein every 8 (eight) hours.  2 mL  0   No current facility-administered medications on file prior to visit.   Active Ambulatory Problems    Diagnosis Date Noted  . Allergy   . Anemia   . Anxiety   . Insomnia   . Osteoarthritis   . Obesity   . CKD (chronic kidney disease)   . Mitral stenosis   . Aortic stenosis   . Aortic regurgitation   . Chronic diastolic CHF (congestive heart failure) 10/01/2013  . Pulmonary HTN 10/21/2013  . HTN (hypertension)   . CAP (community acquired pneumonia) 11/05/2013  . Obstructive chronic bronchitis without exacerbation 11/05/2013  . Daytime somnolence 11/05/2013  . Bradycardia 11/17/2013  . Nocturnal hypoxemia 01/08/2014  .  Syncope 02/08/2014  . Complex sleep apnea syndrome 03/01/2014  . Sepsis 04/08/2014  . Hyperkalemia 04/08/2014  . Severe sepsis(995.92) 04/08/2014  . Lactic acid acidosis 04/08/2014  . Encephalopathy acute 04/08/2014  . Delirium 04/10/2014  . SBE (subacute bacterial endocarditis) 04/13/2014   Resolved Ambulatory Problems    Diagnosis Date Noted  . Acute kidney injury 11/17/2013  . Hypotension 11/17/2013  . Acute renal failure 11/17/2013  . Dehydration 11/18/2013  . AKI (acute kidney injury) 04/08/2014  . Abscess, gluteal s/p I&D 04/09/2014 04/10/2014   Past Medical History  Diagnosis Date  . Diastolic dysfunction    History  Substance Use Topics  . Smoking status: Former Smoker -- 0.25 packs/day for 1 years  . Smokeless tobacco: Never Used     Comment: smoked 1-3 cigs/day in school for about a year some days only  . Alcohol Use: No     Review of Systems 10point ros is reviewed, and negative    Objective:   Physical Exam BP 169/71  Pulse 84  Temp(Src) 98.2 F (36.8 C) (Oral)  Wt 177 lb (80.287 kg) Physical Exam  Constitutional:  oriented to person, place, and time. appears well-developed and well-nourished. No distress. Elderly female younger than stated age Cardiovascular: Normal rate, regular rhythm and normal heart sounds. Exam reveals no gallop and no friction rub.  No murmur heard.  Pulmonary/Chest: Effort normal and breath sounds normal. No respiratory distress.  has no wheezes.  Lymphadenopathy: no cervical adenopathy.  Skin: Skin is warm and dry. No rash noted. No erythema.  Psychiatric: a normal mood and affect.  behavior is normal.      Assessment & Plan:  MSSA native valve endocarditis  = completed course of therapy. No need for further studies other than planned repeat echo to see if EF is changed or any valvular dysfunction

## 2014-07-20 ENCOUNTER — Ambulatory Visit (INDEPENDENT_AMBULATORY_CARE_PROVIDER_SITE_OTHER): Payer: Medicare PPO | Admitting: Cardiology

## 2014-07-20 ENCOUNTER — Encounter: Payer: Self-pay | Admitting: Cardiology

## 2014-07-20 VITALS — BP 154/74 | HR 81 | Ht 63.0 in | Wt 175.1 lb

## 2014-07-20 DIAGNOSIS — I050 Rheumatic mitral stenosis: Secondary | ICD-10-CM

## 2014-07-20 DIAGNOSIS — I351 Nonrheumatic aortic (valve) insufficiency: Secondary | ICD-10-CM

## 2014-07-20 DIAGNOSIS — I498 Other specified cardiac arrhythmias: Secondary | ICD-10-CM

## 2014-07-20 DIAGNOSIS — I5032 Chronic diastolic (congestive) heart failure: Secondary | ICD-10-CM

## 2014-07-20 DIAGNOSIS — I10 Essential (primary) hypertension: Secondary | ICD-10-CM

## 2014-07-20 DIAGNOSIS — I509 Heart failure, unspecified: Secondary | ICD-10-CM

## 2014-07-20 DIAGNOSIS — I330 Acute and subacute infective endocarditis: Secondary | ICD-10-CM

## 2014-07-20 DIAGNOSIS — I350 Nonrheumatic aortic (valve) stenosis: Secondary | ICD-10-CM

## 2014-07-20 DIAGNOSIS — I359 Nonrheumatic aortic valve disorder, unspecified: Secondary | ICD-10-CM

## 2014-07-20 DIAGNOSIS — R001 Bradycardia, unspecified: Secondary | ICD-10-CM

## 2014-07-20 LAB — BASIC METABOLIC PANEL
BUN: 12 mg/dL (ref 6–23)
CO2: 25 mEq/L (ref 19–32)
Calcium: 9.6 mg/dL (ref 8.4–10.5)
Chloride: 106 mEq/L (ref 96–112)
Creatinine, Ser: 1 mg/dL (ref 0.4–1.2)
GFR: 70.58 mL/min (ref >60.00)
GLUCOSE: 108 mg/dL — AB (ref 70–99)
POTASSIUM: 3.7 meq/L (ref 3.5–5.1)
SODIUM: 142 meq/L (ref 135–145)

## 2014-07-20 NOTE — Progress Notes (Signed)
[address] [place], [place]  [address] Phone: [phone] Fax:  [phone]  Date:  07/20/2014   ID:  Monica Blankenship, DOB [birth year], MRN [id number]  PCP:  Georgann Housekeeper, MD  Cardiologist:  Armanda Magic, MD     History of Present Illness: Monica Blankenship is a 80 y.o. female with a history of HTN, chronic diastolic CHF,  MSSA SBE of the MV secondary to gluteal abscess .  She was recently in the hospital in June with syncope felt secondary to bradycardia, dehydration and hyperkalemia.  Her Verapamil does was adjusted.  She is doing well today. She denies any chest pain, dizziness, palpitations or syncope. She has chronic SOB which occurs only occasionally and usually occurs once monthly. Her BP runs high in the am and then drops after taking her meds. She wore a heart monitor while in rehab which showed NSR with PAC's and PVC's.  She now presents today doing well.  She denies any chest pain,  LE edema, dizziness, palpitations or syncope. She has minimal SOB and thinks she is almost back to her baseline.   Wt Readings from Last 3 Encounters:  07/20/14 175 lb 1.9 oz (79.434 kg)  06/30/14 177 lb (80.287 kg)  05/24/14 169 lb 8 oz (76.885 kg)     Past Medical History  Diagnosis Date  . Allergy   . Anxiety   . Insomnia   . Osteoarthritis   . Anemia   . Obesity   . Diastolic dysfunction   . CKD (chronic kidney disease)   . Mitral stenosis     with moderate MR on echo 4/14  . Aortic stenosis     mild by echo 4/14  . Aortic regurgitation     mild to moderate by echo 4/14  . HTN (hypertension)     Current Outpatient Prescriptions  Medication Sig Dispense Refill  . albuterol (PROVENTIL HFA;VENTOLIN HFA) 108 (90 BASE) MCG/ACT inhaler Inhale 2 puffs into the lungs every 6 (six) hours as needed for shortness of breath.      . ALPRAZolam (XANAX) 0.25 MG tablet Take 1 tablet (0.25 mg total) by mouth 2 (two) times daily as needed for anxiety.  30 tablet  0  . aspirin 81  MG chewable tablet Chew 81 mg by mouth daily.       Marland Kitchen ceFAZolin (ANCEF) 2-3 GM-% SOLR Inject 50 mLs (2 g total) into the vein every 8 (eight) hours.  2 mL  0  . cholecalciferol (VITAMIN D) 1000 UNITS tablet Take 1,000 Units by mouth daily.      Marland Kitchen doxazosin (CARDURA) 8 MG tablet Take 8 mg by mouth at bedtime.      . ferrous sulfate 325 (65 FE) MG tablet Take 325 mg by mouth daily with breakfast.      . folic acid (FOLVITE) 1 MG tablet Take 1 mg by mouth daily.      . furosemide (LASIX) 20 MG tablet Take 0.5 tablets (10 mg total) by mouth daily.  30 tablet  0  . hydrALAZINE (APRESOLINE) 50 MG tablet Take 50 mg by mouth See admin instructions. Take every 8 hours for hypertension - hold for SBP <110      . Hypromellose (GENTEAL MILD TO MODERATE OP) Place 1 drop into both eyes daily as needed (dry eyes).      Marland Kitchen loratadine (CLARITIN) 10 MG tablet Take 10 mg by mouth daily as needed for allergies.      Marland Kitchen NON  FORMULARY Oxygen---Nightly      . predniSONE (DELTASONE) 2.5 MG tablet Take 7.5 mg by mouth daily with breakfast.      . traMADol (ULTRAM) 50 MG tablet Take 1 tablet (50 mg total) by mouth 3 (three) times daily as needed for moderate pain.  30 tablet  0  . verapamil (CALAN-SR) 120 MG CR tablet Take 1 tablet (120 mg total) by mouth daily.  30 tablet  3   No current facility-administered medications for this visit.    Allergies:    Allergies  Allergen Reactions  . Fentanyl Other (See Comments)    confusion    Social History:  The patient  reports that she has quit smoking. She has never used smokeless tobacco. She reports that she does not drink alcohol or use illicit drugs.   Family History:  The patient's family history includes CVA in her mother; Hypertension in her mother.   ROS:  Please see the history of present illness.      All other systems reviewed and negative.   PHYSICAL EXAM: VS:  BP 154/74  Pulse 81  Ht 5\' 3"  (1.6 m)  Wt 175 lb 1.9 oz (79.434 kg)  BMI 31.03 kg/m2 Well  nourished, well developed, in no acute distress HEENT: normal Neck: no JVD Cardiac:  normal S1, S2; RRR; no murmur Lungs:  clear to auscultation bilaterally, no wheezing, rhonchi or rales Abd: soft, nontender, no hepatomegaly Ext: no edema Skin: warm and dry Neuro:  CNs 2-12 intact, no focal abnormalities noted  ASSESSMENT AND PLAN:  1. 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 Mild AS and moderate AR  3. Mild MS and moderate MR 4. Chronic diastolic CHF - appears compensated  - continue Lasix/beta blocker/Verapamil  - check BMET 5. HTN - elevated on exam today - at home it runs around 117/51mmHg - continue Verapamil/beta blocker/doxazosin/Hydralazine  6.  Bradycardia resolved on lower dose of Verapamil 7.  Moderate pulmonary HTN  Follouwp with me in 6 months       Signed, 74m, MD 07/20/2014 9:05 AM

## 2014-07-20 NOTE — Patient Instructions (Signed)
Your physician recommends that you continue on your current medications as directed. Please refer to the Current Medication list given to you today.  Your physician recommends that you go to the lab today for a BMET  Your physician wants you to follow-up in: 6 months with Dr Turner You will receive a reminder letter in the mail two months in advance. If you don't receive a letter, please call our office to schedule the follow-up appointment.  

## 2014-07-22 ENCOUNTER — Ambulatory Visit
Admission: RE | Admit: 2014-07-22 | Discharge: 2014-07-22 | Disposition: A | Discharge status: Home / Self Care | Payer: Medicare PPO | Source: Ambulatory Visit

## 2014-07-22 DIAGNOSIS — Z1231 Encounter for screening mammogram for malignant neoplasm of breast: Secondary | ICD-10-CM

## 2014-09-08 ENCOUNTER — Ambulatory Visit
Admission: RE | Admit: 2014-09-08 | Discharge: 2014-09-08 | Disposition: A | Discharge status: Home / Self Care | Payer: Medicare PPO | Source: Ambulatory Visit | Attending: Internal Medicine | Admitting: Internal Medicine

## 2014-09-08 ENCOUNTER — Other Ambulatory Visit: Payer: Self-pay | Admitting: Internal Medicine

## 2014-09-08 DIAGNOSIS — R059 Cough, unspecified: Secondary | ICD-10-CM

## 2014-09-08 DIAGNOSIS — R05 Cough: Secondary | ICD-10-CM

## 2014-09-16 ENCOUNTER — Ambulatory Visit
Admission: RE | Admit: 2014-09-16 | Discharge: 2014-09-16 | Disposition: A | Discharge status: Home / Self Care | Payer: Medicare PPO | Source: Ambulatory Visit | Attending: Nurse Practitioner | Admitting: Nurse Practitioner

## 2014-09-16 ENCOUNTER — Other Ambulatory Visit: Payer: Self-pay | Admitting: Nurse Practitioner

## 2014-09-16 DIAGNOSIS — J209 Acute bronchitis, unspecified: Secondary | ICD-10-CM

## 2014-10-07 NOTE — Telephone Encounter (Signed)
Error

## 2014-11-14 ENCOUNTER — Emergency Department (HOSPITAL_COMMUNITY): Payer: Medicare PPO

## 2014-11-14 ENCOUNTER — Emergency Department (HOSPITAL_COMMUNITY)
Admission: EM | Admit: 2014-11-14 | Discharge: 2014-11-15 | Disposition: A | Discharge status: Home / Self Care | Payer: Medicare PPO | Source: Home / Self Care | Attending: Emergency Medicine | Admitting: Emergency Medicine

## 2014-11-14 ENCOUNTER — Encounter (HOSPITAL_COMMUNITY): Payer: Self-pay

## 2014-11-14 DIAGNOSIS — R531 Weakness: Secondary | ICD-10-CM

## 2014-11-14 DIAGNOSIS — I639 Cerebral infarction, unspecified: Secondary | ICD-10-CM | POA: Diagnosis not present

## 2014-11-14 DIAGNOSIS — R29810 Facial weakness: Secondary | ICD-10-CM | POA: Diagnosis not present

## 2014-11-14 DIAGNOSIS — R4182 Altered mental status, unspecified: Secondary | ICD-10-CM

## 2014-11-14 LAB — URINALYSIS, ROUTINE W REFLEX MICROSCOPIC
Bilirubin Urine: NEGATIVE
Glucose, UA: NEGATIVE mg/dL
Hgb urine dipstick: NEGATIVE
KETONES UR: NEGATIVE mg/dL
Leukocytes, UA: NEGATIVE
NITRITE: NEGATIVE
PROTEIN: NEGATIVE mg/dL
Specific Gravity, Urine: 1.006 (ref 1.005–1.030)
Urobilinogen, UA: 0.2 mg/dL (ref 0.0–1.0)
pH: 7 (ref 5.0–8.0)

## 2014-11-14 LAB — CBC
HEMATOCRIT: 36.9 % (ref 36.0–46.0)
HEMOGLOBIN: 12.1 g/dL (ref 12.0–15.0)
MCH: 32.3 pg (ref 26.0–34.0)
MCHC: 32.8 g/dL (ref 30.0–36.0)
MCV: 98.4 fL (ref 78.0–100.0)
Platelets: 199 10*3/uL (ref 150–400)
RBC: 3.75 MIL/uL — ABNORMAL LOW (ref 3.87–5.11)
RDW: 17.2 % — ABNORMAL HIGH (ref 11.5–15.5)
WBC: 7.4 10*3/uL (ref 4.0–10.5)

## 2014-11-14 LAB — COMPREHENSIVE METABOLIC PANEL
ALT: 17 U/L (ref 0–35)
ANION GAP: 14 (ref 5–15)
AST: 29 U/L (ref 0–37)
Albumin: 4.1 g/dL (ref 3.5–5.2)
Alkaline Phosphatase: 73 U/L (ref 39–117)
BUN: 13 mg/dL (ref 6–23)
CHLORIDE: 102 meq/L (ref 96–112)
CO2: 25 mEq/L (ref 19–32)
Calcium: 10.2 mg/dL (ref 8.4–10.5)
Creatinine, Ser: 0.95 mg/dL (ref 0.50–1.10)
GFR, EST AFRICAN AMERICAN: 62 mL/min — AB (ref >90)
GFR, EST NON AFRICAN AMERICAN: 54 mL/min — AB (ref >90)
GLUCOSE: 148 mg/dL — AB (ref 70–99)
POTASSIUM: 4.6 meq/L (ref 3.7–5.3)
Sodium: 141 mEq/L (ref 137–147)
TOTAL PROTEIN: 8.5 g/dL — AB (ref 6.0–8.3)
Total Bilirubin: 0.6 mg/dL (ref 0.3–1.2)

## 2014-11-14 NOTE — ED Provider Notes (Signed)
CSN: [id number]     Arrival date & time 11/14/14  1955 History   First MD Initiated Contact with Patient 11/14/14 2010     Chief Complaint  Patient presents with  . Altered Mental Status     (Consider location/radiation/quality/duration/timing/severity/associated sxs/prior Treatment) Patient is a 80 y.o. female presenting with altered mental status. The history is provided by the patient and a relative.  Altered Mental Status Associated symptoms: no abdominal pain, no fever, no headaches, no light-headedness, no rash and no vomiting   pt c/o feeling generally weak, fatigue for the past day. Pt is difficult/vague historian. Constant. Moderate. Pt denies specific exacerbating or alleviating factors.  Pt denies fever or chills. Normal appetite. No nvd. Denies cough or uri c/o. No headache. No chest pain or sob. No abd pain. Denies dysuria or gu c/o. States compliant w normal meds, no recent change in meds or doses.  Pt denies unilateral or focal numbness or weakness. No change in speech or vision.       Past Medical History  Diagnosis Date  . Allergy   . Anxiety   . Insomnia   . Osteoarthritis   . Anemia   . Obesity   . Diastolic dysfunction   . CKD (chronic kidney disease)   . Mitral stenosis     with moderate MR on echo 4/14  . Aortic stenosis     mild by echo 4/14  . Aortic regurgitation     mild to moderate by echo 4/14  . HTN (hypertension)    Past Surgical History  Procedure Laterality Date  . Eye surgery    . Abdominal hysterectomy    . Appendectomy    . Cataract extraction, bilateral    . Tee without cardioversion N/A 04/13/2014    Procedure: TRANSESOPHAGEAL ECHOCARDIOGRAM (TEE);  Surgeon: Vesta Mixer, MD;  Location: [facility] ENDOSCOPY;  Service: Cardiovascular;  Laterality: N/A;   Family History  Problem Relation Age of Onset  . Hypertension Mother   . CVA Mother    History  Substance Use Topics  . Smoking status: Former Smoker -- 0.25 packs/day for 1 years   . Smokeless tobacco: Never Used     Comment: smoked 1-3 cigs/day in school for about a year some days only  . Alcohol Use: No   OB History    No data available     Review of Systems  Constitutional: Negative for fever and chills.  HENT: Negative for sore throat.   Eyes: Negative for redness and visual disturbance.  Respiratory: Negative for cough and shortness of breath.   Cardiovascular: Negative for chest pain.  Gastrointestinal: Negative for vomiting, abdominal pain, diarrhea and blood in stool.  Endocrine: Negative for polyuria.  Genitourinary: Negative for dysuria and flank pain.  Musculoskeletal: Negative for back pain and neck pain.  Skin: Negative for rash.  Neurological: Negative for syncope, light-headedness and headaches.  Hematological: Does not bruise/bleed easily.  Psychiatric/Behavioral: The patient is not nervous/anxious.       Allergies  Fentanyl  Home Medications   Prior to Admission medications   Medication Sig Start Date End Date Taking? Authorizing Provider  albuterol (PROVENTIL HFA;VENTOLIN HFA) 108 (90 BASE) MCG/ACT inhaler Inhale 2 puffs into the lungs every 6 (six) hours as needed for shortness of breath.    Historical Provider, MD  ALPRAZolam Prudy Feeler) 0.25 MG tablet Take 1 tablet (0.25 mg total) by mouth 2 (two) times daily as needed for anxiety. 05/24/14   Georgann Housekeeper, MD  aspirin  81 MG chewable tablet Chew 81 mg by mouth daily.     Historical Provider, MD  ceFAZolin (ANCEF) 2-3 GM-% SOLR Inject 50 mLs (2 g total) into the vein every 8 (eight) hours. 04/13/14   Georgann Housekeeper, MD  cholecalciferol (VITAMIN D) 1000 UNITS tablet Take 1,000 Units by mouth daily.    Historical Provider, MD  doxazosin (CARDURA) 8 MG tablet Take 8 mg by mouth at bedtime.    Historical Provider, MD  ferrous sulfate 325 (65 FE) MG tablet Take 325 mg by mouth daily with breakfast.    Historical Provider, MD  folic acid (FOLVITE) 1 MG tablet Take 1 mg by mouth daily.     Historical Provider, MD  furosemide (LASIX) 20 MG tablet Take 0.5 tablets (10 mg total) by mouth daily. 05/24/14   Georgann Housekeeper, MD  hydrALAZINE (APRESOLINE) 50 MG tablet Take 50 mg by mouth See admin instructions. Take every 8 hours for hypertension - hold for SBP <110    Historical Provider, MD  Hypromellose (GENTEAL MILD TO MODERATE OP) Place 1 drop into both eyes daily as needed (dry eyes).    Historical Provider, MD  loratadine (CLARITIN) 10 MG tablet Take 10 mg by mouth daily as needed for allergies.    Historical Provider, MD  NON FORMULARY Oxygen---Nightly    Historical Provider, MD  predniSONE (DELTASONE) 2.5 MG tablet Take 7.5 mg by mouth daily with breakfast.    Historical Provider, MD  traMADol (ULTRAM) 50 MG tablet Take 1 tablet (50 mg total) by mouth 3 (three) times daily as needed for moderate pain. 05/24/14   Georgann Housekeeper, MD  verapamil (CALAN-SR) 120 MG CR tablet Take 1 tablet (120 mg total) by mouth daily. 05/24/14   Georgann Housekeeper, MD   BP 138/57 mmHg  Pulse 73  Temp(Src) 98.3 F (36.8 C) (Oral)  Resp 16  Ht 5' 1.5" (1.562 m)  Wt 178 lb 12.8 oz (81.103 kg)  BMI 33.24 kg/m2  SpO2 96% Physical Exam  Constitutional: She is oriented to person, place, and time. She appears well-developed and well-nourished. No distress.  HENT:  Head: Atraumatic.  Mouth/Throat: Oropharynx is clear and moist.  Eyes: Conjunctivae are normal. Pupils are equal, round, and reactive to light. No scleral icterus.  Neck: Neck supple. No tracheal deviation present.  No stiffness or rigidity  Cardiovascular: Normal rate, regular rhythm, normal heart sounds and intact distal pulses.   Pulmonary/Chest: Effort normal and breath sounds normal. No respiratory distress.  Abdominal: Soft. Normal appearance and bowel sounds are normal. She exhibits no distension and no mass. There is no tenderness. There is no rebound and no guarding.  Genitourinary:  No cva tenderness  Musculoskeletal: She exhibits no edema  or tenderness.  Neurological: She is alert and oriented to person, place, and time.  Motor intact bil, stre 5/5, sens intact.   Skin: Skin is warm and dry. No rash noted.  Psychiatric: She has a normal mood and affect.  Nursing note and vitals reviewed.   ED Course  Procedures (including critical care time) Labs Review  Results for orders placed or performed during the hospital encounter of 11/14/14  CBC  Result Value Ref Range   WBC 7.4 4.0 - 10.5 K/uL   RBC 3.75 (L) 3.87 - 5.11 MIL/uL   Hemoglobin 12.1 12.0 - 15.0 g/dL   HCT 33.5 45.6 - 25.6 %   MCV 98.4 78.0 - 100.0 fL   MCH 32.3 26.0 - 34.0 pg   MCHC 32.8 30.0 -  36.0 g/dL   RDW 52.8 (H) 41.3 - 24.4 %   Platelets 199 150 - 400 K/uL  Comprehensive metabolic panel  Result Value Ref Range   Sodium 141 137 - 147 mEq/L   Potassium 4.6 3.7 - 5.3 mEq/L   Chloride 102 96 - 112 mEq/L   CO2 25 19 - 32 mEq/L   Glucose, Bld 148 (H) 70 - 99 mg/dL   BUN 13 6 - 23 mg/dL   Creatinine, Ser 0.10 0.50 - 1.10 mg/dL   Calcium 27.2 8.4 - 53.6 mg/dL   Total Protein 8.5 (H) 6.0 - 8.3 g/dL   Albumin 4.1 3.5 - 5.2 g/dL   AST 29 0 - 37 U/L   ALT 17 0 - 35 U/L   Alkaline Phosphatase 73 39 - 117 U/L   Total Bilirubin 0.6 0.3 - 1.2 mg/dL   GFR calc non Af Amer 54 (L) >90 mL/min   GFR calc Af Amer 62 (L) >90 mL/min   Anion gap 14 5 - 15  Urinalysis, Routine w reflex microscopic  Result Value Ref Range   Color, Urine YELLOW YELLOW   APPearance CLEAR CLEAR   Specific Gravity, Urine 1.006 1.005 - 1.030   pH 7.0 5.0 - 8.0   Glucose, UA NEGATIVE NEGATIVE mg/dL   Hgb urine dipstick NEGATIVE NEGATIVE   Bilirubin Urine NEGATIVE NEGATIVE   Ketones, ur NEGATIVE NEGATIVE mg/dL   Protein, ur NEGATIVE NEGATIVE mg/dL   Urobilinogen, UA 0.2 0.0 - 1.0 mg/dL   Nitrite NEGATIVE NEGATIVE   Leukocytes, UA NEGATIVE NEGATIVE   Ct Head Wo Contrast  11/15/2014   CLINICAL DATA:  Altered mental status.  EXAM: CT HEAD WITHOUT CONTRAST  TECHNIQUE: Contiguous  axial images were obtained from the base of the skull through the vertex without intravenous contrast.  COMPARISON:  CT scan November 17, 2013.  FINDINGS: Bony calvarium appears intact. Mild chronic ischemic white matter disease is noted. Stable subcortical low density is noted in right frontal lobe consistent with old infarction. No mass effect or midline shift is noted. Ventricular size is within normal limits. There is no evidence of mass lesion, hemorrhage or acute infarction.  IMPRESSION: Mild chronic ischemic white matter disease. 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 No acute intracranial abnormality seen.   Electronically Signed   By: Roque Lias M.D.   On: 11/15/2014 00:22       EKG Interpretation   Date/Time:  Sunday November 14 2014 21:49:05 EST Ventricular Rate:  73 PR Interval:  187 QRS Duration: 99 QT Interval:  432 QTC Calculation: 476 R Axis:   50 Text Interpretation:  Sinus rhythm Probable left ventricular hypertrophy  Nonspecific T wave abnormality Confirmed by Denton Lank  MD, Caryn Bee ([id number]) on  11/14/2014 9:56:05 PM      MDM   iv ns. Labs.  Reviewed nursing notes and prior charts for additional history.   Family subsequently arrives, they state pt not acting her normal self today.  Was eating less, was not interested in watching the [employer] football game, knocked clocked off table, slept more than normal, seemed unsteady. No fevers. No c/o pain.   Po fluids.   Recheck pt, alert, content, denies pain, no nv.   Pt currently appears stable for d/c.  Recommend close pcp follow up.  Return precautions discussed.     Suzi Roots, MD 11/15/14 [phone]

## 2014-11-14 NOTE — ED Notes (Signed)
Pt brought in by EMS. EMS reports that family states that pt has been not acting her normal self and that pt gets like this when potassium is low. Pt alert and oriented. Pt has hx of  Arthritis and CHF. No other interventions needed while en route. Family at bedside.

## 2014-11-15 ENCOUNTER — Encounter (HOSPITAL_COMMUNITY): Payer: Self-pay | Admitting: Emergency Medicine

## 2014-11-15 ENCOUNTER — Emergency Department (HOSPITAL_COMMUNITY): Payer: Medicare PPO

## 2014-11-15 ENCOUNTER — Inpatient Hospital Stay (HOSPITAL_COMMUNITY)
Admission: EM | Admit: 2014-11-15 | Discharge: 2014-11-17 | DRG: 065 | Disposition: A | Discharge status: Skilled Nursing Facility | Payer: Medicare PPO | Attending: Family Medicine | Admitting: Family Medicine

## 2014-11-15 DIAGNOSIS — I639 Cerebral infarction, unspecified: Secondary | ICD-10-CM | POA: Diagnosis present

## 2014-11-15 DIAGNOSIS — Z6832 Body mass index (BMI) 32.0-32.9, adult: Secondary | ICD-10-CM

## 2014-11-15 DIAGNOSIS — M790 Rheumatism, unspecified: Secondary | ICD-10-CM | POA: Diagnosis present

## 2014-11-15 DIAGNOSIS — Z888 Allergy status to other drugs, medicaments and biological substances status: Secondary | ICD-10-CM

## 2014-11-15 DIAGNOSIS — I129 Hypertensive chronic kidney disease with stage 1 through stage 4 chronic kidney disease, or unspecified chronic kidney disease: Secondary | ICD-10-CM | POA: Diagnosis present

## 2014-11-15 DIAGNOSIS — I272 Other secondary pulmonary hypertension: Secondary | ICD-10-CM | POA: Diagnosis present

## 2014-11-15 DIAGNOSIS — Z7982 Long term (current) use of aspirin: Secondary | ICD-10-CM | POA: Diagnosis not present

## 2014-11-15 DIAGNOSIS — G4700 Insomnia, unspecified: Secondary | ICD-10-CM | POA: Diagnosis present

## 2014-11-15 DIAGNOSIS — M069 Rheumatoid arthritis, unspecified: Secondary | ICD-10-CM | POA: Diagnosis present

## 2014-11-15 DIAGNOSIS — Z90710 Acquired absence of both cervix and uterus: Secondary | ICD-10-CM

## 2014-11-15 DIAGNOSIS — G467 Other lacunar syndromes: Secondary | ICD-10-CM | POA: Diagnosis present

## 2014-11-15 DIAGNOSIS — D638 Anemia in other chronic diseases classified elsewhere: Secondary | ICD-10-CM | POA: Diagnosis present

## 2014-11-15 DIAGNOSIS — R531 Weakness: Secondary | ICD-10-CM | POA: Diagnosis present

## 2014-11-15 DIAGNOSIS — Z9841 Cataract extraction status, right eye: Secondary | ICD-10-CM

## 2014-11-15 DIAGNOSIS — M1990 Unspecified osteoarthritis, unspecified site: Secondary | ICD-10-CM | POA: Diagnosis present

## 2014-11-15 DIAGNOSIS — F419 Anxiety disorder, unspecified: Secondary | ICD-10-CM | POA: Diagnosis present

## 2014-11-15 DIAGNOSIS — R29810 Facial weakness: Secondary | ICD-10-CM | POA: Diagnosis present

## 2014-11-15 DIAGNOSIS — I5032 Chronic diastolic (congestive) heart failure: Secondary | ICD-10-CM | POA: Diagnosis present

## 2014-11-15 DIAGNOSIS — G8194 Hemiplegia, unspecified affecting left nondominant side: Secondary | ICD-10-CM | POA: Diagnosis present

## 2014-11-15 DIAGNOSIS — I63311 Cerebral infarction due to thrombosis of right middle cerebral artery: Secondary | ICD-10-CM

## 2014-11-15 DIAGNOSIS — I340 Nonrheumatic mitral (valve) insufficiency: Secondary | ICD-10-CM | POA: Diagnosis present

## 2014-11-15 DIAGNOSIS — I10 Essential (primary) hypertension: Secondary | ICD-10-CM

## 2014-11-15 DIAGNOSIS — Z7952 Long term (current) use of systemic steroids: Secondary | ICD-10-CM | POA: Diagnosis not present

## 2014-11-15 DIAGNOSIS — N183 Chronic kidney disease, stage 3 (moderate): Secondary | ICD-10-CM | POA: Diagnosis present

## 2014-11-15 DIAGNOSIS — E669 Obesity, unspecified: Secondary | ICD-10-CM | POA: Diagnosis present

## 2014-11-15 DIAGNOSIS — Z9842 Cataract extraction status, left eye: Secondary | ICD-10-CM | POA: Diagnosis not present

## 2014-11-15 DIAGNOSIS — Z87891 Personal history of nicotine dependence: Secondary | ICD-10-CM

## 2014-11-15 DIAGNOSIS — I099 Rheumatic heart disease, unspecified: Secondary | ICD-10-CM | POA: Diagnosis present

## 2014-11-15 DIAGNOSIS — R4781 Slurred speech: Secondary | ICD-10-CM | POA: Diagnosis present

## 2014-11-15 DIAGNOSIS — E785 Hyperlipidemia, unspecified: Secondary | ICD-10-CM | POA: Diagnosis present

## 2014-11-15 LAB — APTT: APTT: 29 s (ref 24–37)

## 2014-11-15 LAB — COMPREHENSIVE METABOLIC PANEL
ALT: 15 U/L (ref 0–35)
AST: 15 U/L (ref 0–37)
Albumin: 3.7 g/dL (ref 3.5–5.2)
Alkaline Phosphatase: 73 U/L (ref 39–117)
Anion gap: 13 (ref 5–15)
BUN: 13 mg/dL (ref 6–23)
CALCIUM: 9.7 mg/dL (ref 8.4–10.5)
CO2: 25 mEq/L (ref 19–32)
CREATININE: 0.84 mg/dL (ref 0.50–1.10)
Chloride: 102 mEq/L (ref 96–112)
GFR calc Af Amer: 72 mL/min — ABNORMAL LOW (ref >90)
GFR calc non Af Amer: 63 mL/min — ABNORMAL LOW (ref >90)
Glucose, Bld: 141 mg/dL — ABNORMAL HIGH (ref 70–99)
Potassium: 4.1 mEq/L (ref 3.7–5.3)
Sodium: 140 mEq/L (ref 137–147)
Total Bilirubin: 0.5 mg/dL (ref 0.3–1.2)
Total Protein: 7.8 g/dL (ref 6.0–8.3)

## 2014-11-15 LAB — CBC
HEMATOCRIT: 36.1 % (ref 36.0–46.0)
Hemoglobin: 11.8 g/dL — ABNORMAL LOW (ref 12.0–15.0)
MCH: 31.7 pg (ref 26.0–34.0)
MCHC: 32.7 g/dL (ref 30.0–36.0)
MCV: 97 fL (ref 78.0–100.0)
PLATELETS: 184 10*3/uL (ref 150–400)
RBC: 3.72 MIL/uL — ABNORMAL LOW (ref 3.87–5.11)
RDW: 17 % — AB (ref 11.5–15.5)
WBC: 8.3 10*3/uL (ref 4.0–10.5)

## 2014-11-15 LAB — I-STAT CHEM 8, ED
BUN: 14 mg/dL (ref 6–23)
Calcium, Ion: 1.15 mmol/L (ref 1.13–1.30)
Chloride: 104 mEq/L (ref 96–112)
Creatinine, Ser: 1 mg/dL (ref 0.50–1.10)
Glucose, Bld: 144 mg/dL — ABNORMAL HIGH (ref 70–99)
HCT: 39 % (ref 36.0–46.0)
Hemoglobin: 13.3 g/dL (ref 12.0–15.0)
POTASSIUM: 4 meq/L (ref 3.7–5.3)
SODIUM: 141 meq/L (ref 137–147)
TCO2: 24 mmol/L (ref 0–100)

## 2014-11-15 LAB — I-STAT TROPONIN, ED: TROPONIN I, POC: 0.01 ng/mL (ref 0.00–0.08)

## 2014-11-15 LAB — URINALYSIS, ROUTINE W REFLEX MICROSCOPIC
BILIRUBIN URINE: NEGATIVE
Glucose, UA: NEGATIVE mg/dL
HGB URINE DIPSTICK: NEGATIVE
Ketones, ur: NEGATIVE mg/dL
Leukocytes, UA: NEGATIVE
Nitrite: NEGATIVE
Protein, ur: NEGATIVE mg/dL
Specific Gravity, Urine: 1.008 (ref 1.005–1.030)
UROBILINOGEN UA: 0.2 mg/dL (ref 0.0–1.0)
pH: 7 (ref 5.0–8.0)

## 2014-11-15 LAB — RAPID URINE DRUG SCREEN, HOSP PERFORMED
AMPHETAMINES: NOT DETECTED
BENZODIAZEPINES: NOT DETECTED
Barbiturates: NOT DETECTED
COCAINE: NOT DETECTED
OPIATES: NOT DETECTED
Tetrahydrocannabinol: NOT DETECTED

## 2014-11-15 LAB — DIFFERENTIAL
BASOS PCT: 1 % (ref 0–1)
Basophils Absolute: 0 10*3/uL (ref 0.0–0.1)
Eosinophils Absolute: 0.1 10*3/uL (ref 0.0–0.7)
Eosinophils Relative: 1 % (ref 0–5)
Lymphocytes Relative: 22 % (ref 12–46)
Lymphs Abs: 1.8 10*3/uL (ref 0.7–4.0)
MONO ABS: 0.5 10*3/uL (ref 0.1–1.0)
Monocytes Relative: 6 % (ref 3–12)
NEUTROS ABS: 5.9 10*3/uL (ref 1.7–7.7)
Neutrophils Relative %: 70 % (ref 43–77)

## 2014-11-15 LAB — PROTIME-INR
INR: 1.12 (ref 0.00–1.49)
PROTHROMBIN TIME: 14.6 s (ref 11.6–15.2)

## 2014-11-15 LAB — ETHANOL

## 2014-11-15 MED ORDER — FOLIC ACID 1 MG PO TABS
1.0000 mg | ORAL_TABLET | Freq: Every day | ORAL | Status: DC
  Administered 2014-11-16 – 2014-11-17 (×2): 1 mg via ORAL
  Filled 2014-11-15 (×2): qty 1

## 2014-11-15 MED ORDER — PREDNISONE 5 MG PO TABS
5.0000 mg | ORAL_TABLET | Freq: Every day | ORAL | Status: DC
  Administered 2014-11-16 – 2014-11-17 (×2): 5 mg via ORAL
  Filled 2014-11-15 (×2): qty 1

## 2014-11-15 MED ORDER — ASPIRIN EC 81 MG PO TBEC
81.0000 mg | DELAYED_RELEASE_TABLET | Freq: Every day | ORAL | Status: DC
  Administered 2014-11-15: 81 mg via ORAL
  Filled 2014-11-15: qty 1

## 2014-11-15 MED ORDER — LORAZEPAM 2 MG/ML IJ SOLN
0.5000 mg | Freq: Once | INTRAMUSCULAR | Status: AC
  Administered 2014-11-15: 0.5 mg via INTRAVENOUS
  Filled 2014-11-15: qty 1

## 2014-11-15 MED ORDER — STROKE: EARLY STAGES OF RECOVERY BOOK
Freq: Once | Status: AC
  Administered 2014-11-15
  Filled 2014-11-15: qty 1

## 2014-11-15 MED ORDER — ALBUTEROL SULFATE (2.5 MG/3ML) 0.083% IN NEBU
3.0000 mL | INHALATION_SOLUTION | Freq: Four times a day (QID) | RESPIRATORY_TRACT | Status: DC | PRN
Start: 2014-11-15 — End: 2014-11-17

## 2014-11-15 MED ORDER — ASPIRIN 325 MG PO TABS
325.0000 mg | ORAL_TABLET | Freq: Every day | ORAL | Status: DC
  Administered 2014-11-16 – 2014-11-17 (×2): 325 mg via ORAL
  Filled 2014-11-15 (×2): qty 1

## 2014-11-15 MED ORDER — FERROUS SULFATE 325 (65 FE) MG PO TABS
325.0000 mg | ORAL_TABLET | Freq: Every day | ORAL | Status: DC
  Administered 2014-11-16 – 2014-11-17 (×2): 325 mg via ORAL
  Filled 2014-11-15 (×2): qty 1

## 2014-11-15 MED ORDER — SENNOSIDES-DOCUSATE SODIUM 8.6-50 MG PO TABS: 1.0000 | ORAL_TABLET | Freq: Every evening | ORAL | Status: DC | PRN

## 2014-11-15 MED ORDER — HYDRALAZINE HCL 50 MG PO TABS
75.0000 mg | ORAL_TABLET | Freq: Three times a day (TID) | ORAL | Status: DC
  Administered 2014-11-15 – 2014-11-17 (×6): 75 mg via ORAL
  Filled 2014-11-15 (×12): qty 1

## 2014-11-15 MED ORDER — ENOXAPARIN SODIUM 40 MG/0.4ML ~~LOC~~ SOLN
40.0000 mg | Freq: Every day | SUBCUTANEOUS | Status: DC
  Administered 2014-11-15 – 2014-11-16 (×2): 40 mg via SUBCUTANEOUS
  Filled 2014-11-15 (×2): qty 0.4

## 2014-11-15 MED ORDER — ALPRAZOLAM 0.25 MG PO TABS: 0.2500 mg | ORAL_TABLET | Freq: Two times a day (BID) | ORAL | Status: DC | PRN

## 2014-11-15 MED ORDER — ASPIRIN 300 MG RE SUPP: 300.0000 mg | Freq: Every day | RECTAL | Status: DC

## 2014-11-15 MED ORDER — DOXAZOSIN MESYLATE 8 MG PO TABS
8.0000 mg | ORAL_TABLET | Freq: Every day | ORAL | Status: DC
  Administered 2014-11-15 – 2014-11-16 (×2): 8 mg via ORAL
  Filled 2014-11-15 (×2): qty 1

## 2014-11-15 MED ORDER — VERAPAMIL HCL ER 120 MG PO TBCR
120.0000 mg | EXTENDED_RELEASE_TABLET | Freq: Every day | ORAL | Status: DC
  Administered 2014-11-16 – 2014-11-17 (×2): 120 mg via ORAL
  Filled 2014-11-15 (×2): qty 1

## 2014-11-15 MED ORDER — SODIUM CHLORIDE 0.9 % IV SOLN
INTRAVENOUS | Status: AC
  Administered 2014-11-15: 1000 mL via INTRAVENOUS

## 2014-11-15 MED ORDER — LORATADINE 10 MG PO TABS: 10.0000 mg | ORAL_TABLET | Freq: Every day | ORAL | Status: DC | PRN

## 2014-11-15 NOTE — ED Notes (Signed)
Pt family at bedside, added that pt has been slurring since Sunday and noticed facial droop since this morning. Pt family added that pt didn't seem as focused when speaking to her.

## 2014-11-15 NOTE — H&P (Signed)
[facility] History and Physical  Monica Blankenship NVB:[id number] DOB: [birth year] DOA: 11/15/2014  Referring physician: ER physician. PCP: Georgann Housekeeper, MD   Chief Complaint: Left-sided weakness.  HPI: Monica Blankenship is a 80 y.o. 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 Patient has been having some confusion since 2 days now and today patient's family noticed that patient had left facial droop with left-sided weakness. Patient also found it difficult to walk. In the ER CT head did not show anything acute and on-call neurologist was consulted. MRI of the brain was done which shows multifocal infarcts involving the right MCA territory. Patient has been admitted for CVA workup. Patient denies any chest pain shortness of breath nausea vomiting headache visual symptoms. Patient has slurred speech.   Review of Systems: As presented in the history of presenting illness, rest negative.  Past Medical History  Diagnosis Date  . Allergy   . Anxiety   . Insomnia   . Osteoarthritis   . Anemia   . Obesity   . Diastolic dysfunction   . CKD (chronic kidney disease)   . Mitral stenosis     with moderate MR on echo 4/14  . Aortic stenosis     mild by echo 4/14  . Aortic regurgitation     mild to moderate by echo 4/14  . HTN (hypertension)    Past Surgical History  Procedure Laterality Date  . Eye surgery    . Abdominal hysterectomy    . Appendectomy    . Cataract extraction, bilateral    . Tee without cardioversion N/A 04/13/2014    Procedure: TRANSESOPHAGEAL ECHOCARDIOGRAM (TEE);  Surgeon: Vesta Mixer, MD;  Location: [facility] ENDOSCOPY;  Service: Cardiovascular;  Laterality: N/A;   Social History:  reports that she has quit smoking.  She has never used smokeless tobacco. She reports that she does not drink alcohol or use illicit drugs. Where does patient live at home. Can patient participate in ADLs? Not sure.  Allergies  Allergen Reactions  . Fentanyl Other (See Comments)    confusion    Family History:  Family History  Problem Relation Age of Onset  . Hypertension Mother   . CVA Mother       Prior to Admission medications   Medication Sig Start Date End Date Taking? Authorizing Provider  acetaminophen (TYLENOL) 500 MG tablet Take 1,000 mg by mouth every 6 (six) hours as needed for mild pain or moderate pain.   Yes Historical Provider, MD  albuterol (PROVENTIL HFA;VENTOLIN HFA) 108 (90 BASE) MCG/ACT inhaler Inhale 2 puffs into the lungs every 6 (six) hours as needed for shortness of breath.   Yes Historical Provider, MD  ALPRAZolam (XANAX) 0.25 MG tablet Take 1 tablet (0.25 mg total) by mouth 2 (two) times daily as needed for anxiety. 05/24/14  Yes Georgann Housekeeper, MD  aspirin 81 MG chewable tablet Chew 81 mg by mouth daily.    Yes Historical Provider, MD  cholecalciferol (VITAMIN D) 1000 UNITS tablet Take 1,000 Units by mouth daily.   Yes Historical Provider, MD  doxazosin (CARDURA) 8 MG tablet Take 8 mg by mouth at bedtime.   Yes Historical Provider, MD  ferrous sulfate 325 (65 FE) MG tablet Take 325 mg by mouth daily with breakfast.   Yes Historical Provider, MD  folic acid (FOLVITE) 1 MG tablet Take 1 mg by mouth daily.   Yes Historical Provider, MD  furosemide (LASIX) 20 MG tablet Take 0.5 tablets (10 mg total) by mouth daily. 05/24/14  Yes Georgann Housekeeper, MD  hydrALAZINE (APRESOLINE) 50 MG tablet Take 75 mg by mouth 3 (three) times daily. Take every 8 hours for hypertension - hold for SBP <110   Yes Historical Provider, MD  Hypromellose (GENTEAL MILD TO MODERATE OP) Place 1 drop into both eyes daily as needed (dry eyes).   Yes Historical Provider, MD  loratadine (CLARITIN) 10 MG tablet Take 10 mg by mouth daily as  needed for allergies.   Yes Historical Provider, MD  NON FORMULARY Oxygen---Nightly   Yes Historical Provider, MD  predniSONE (DELTASONE) 5 MG tablet Take 5 mg by mouth daily with breakfast.   Yes Historical Provider, MD  traMADol (ULTRAM) 50 MG tablet Take 1 tablet (50 mg total) by mouth 3 (three) times daily as needed for moderate pain. 05/24/14  Yes Georgann Housekeeper, MD  verapamil (CALAN-SR) 120 MG CR tablet Take 1 tablet (120 mg total) by mouth daily. 05/24/14  Yes Georgann Housekeeper, MD  ceFAZolin (ANCEF) 2-3 GM-% SOLR Inject 50 mLs (2 g total) into the vein every 8 (eight) hours. Patient not taking: Reported on 11/14/2014 04/13/14   Georgann Housekeeper, MD    Physical Exam: Filed Vitals:   11/15/14 2032 11/15/14 2210 11/15/14 2245 11/15/14 2250  BP:  182/80 172/73   Pulse:  76 73   Temp: 98.8 F (37.1 C)  98.6 F (37 C)   TempSrc:   Oral   Resp:  16 16   Height:    5\' 2"  (1.575 m)  Weight:    79.379 kg (175 lb)  SpO2:  96% 96%      General:  Well-developed and nourished.  Eyes: Anicteric no pallor.  ENT: Left facial droop.  Neck: No mass felt or no neck rigidity.  Cardiovascular: S1-S2 heard.  Respiratory: No rhonchi or crepitations.  Abdomen: Soft nontender bowel sounds present.  Skin: No rash.  Musculoskeletal: No edema.  Psychiatric: Appears normal.  Neurologic: Mildly lethargic but oriented to time place and person. Left facial droop. Left upper and lower extremity is 3 x 5 in strength. Right upper and lower extremity is 5 x 5 strength.  Labs on Admission:  Basic Metabolic Panel:  Recent Labs Lab 11/14/14 2030 11/15/14 1851 11/15/14 1905  NA 141 140 141  K 4.6 4.1 4.0  CL 102 102 104  CO2 25 25  --   GLUCOSE 148* 141* 144*  BUN 13 13 14   CREATININE 0.95 0.84 1.00  CALCIUM 10.2 9.7  --    Liver Function Tests:  Recent Labs Lab 11/14/14 2030 11/15/14 1851  AST 29 15  ALT 17 15  ALKPHOS 73 73  BILITOT 0.6 0.5  PROT 8.5* 7.8  ALBUMIN 4.1 3.7   No  results for input(s): LIPASE, AMYLASE in the last 168 hours. No results for input(s): AMMONIA in the last 168 hours. CBC:  Recent Labs Lab 11/14/14 2030 11/15/14 1851 11/15/14 1905  WBC 7.4 8.3  --   NEUTROABS  --  5.9  --   HGB 12.1 11.8* 13.3  HCT 36.9 36.1 39.0  MCV 98.4 97.0  --   PLT 199 184  --    Cardiac Enzymes: No results for input(s): CKTOTAL, CKMB, CKMBINDEX, TROPONINI in the last 168 hours.  BNP (last 3 results)  Recent Labs  04/08/14  0807 04/09/14 0909  PROBNP 5143.0* 16685.0*   CBG: No results for input(s): GLUCAP in the last 168 hours.  Radiological Exams on Admission: Ct Head Wo Contrast  11/15/2014   CLINICAL DATA:  Left-sided weakness, stroke protocol  EXAM: CT HEAD WITHOUT CONTRAST  TECHNIQUE: Contiguous axial images were obtained from the base of the skull through the vertex without intravenous contrast.  COMPARISON:  11/14/2014  FINDINGS: There is no evidence of mass effect, midline shift, or extra-axial fluid collections. There is no evidence of a space-occupying lesion or intracranial hemorrhage. There is no evidence of a cortical-based area of acute infarction. Old right frontal lobe infarct with encephalomalacia. There is an old right cerebellar infarct. There is generalized cerebral atrophy. There is periventricular white matter low attenuation likely secondary to microangiopathy.  The ventricles and sulci are appropriate for the patient's age. The basal cisterns are patent.  Visualized portions of the orbits are unremarkable. The visualized portions of the paranasal sinuses and mastoid air cells are unremarkable. Cerebrovascular atherosclerotic calcifications are noted.  The osseous structures are unremarkable.  IMPRESSION: 1. No acute intracranial pathology. 2. Old right frontal lobe infarct. 3. Chronic microvascular disease and cerebral atrophy. These results were called by telephone at the time of interpretation on 11/15/2014 at 7:13 pm to Dr. Thad Ranger,  who verbally acknowledged these results.   Electronically Signed   By: Elige Ko   On: 11/15/2014 19:19   Ct Head Wo Contrast  11/15/2014   CLINICAL DATA:  Altered mental status.  EXAM: CT HEAD WITHOUT CONTRAST  TECHNIQUE: Contiguous axial images were obtained from the base of the skull through the vertex without intravenous contrast.  COMPARISON:  CT scan November 17, 2013.  FINDINGS: Bony calvarium appears intact. Mild chronic ischemic white matter disease is noted. Stable subcortical low density is noted in right frontal lobe consistent with old infarction. No mass effect or midline shift is noted. Ventricular size is within normal limits. There is no evidence of mass lesion, hemorrhage or acute infarction.  IMPRESSION: Mild chronic ischemic white matter disease. 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 No acute intracranial abnormality seen.   Electronically Signed   By: Roque Lias M.D.   On: 11/15/2014 00:22   Mr Maxine Glenn Head Wo Contrast  11/15/2014   EXAM: MRI HEAD WITHOUT CONTRAST  MRA HEAD WITHOUT CONTRAST  TECHNIQUE: Multiplanar, multiecho pulse sequences of the brain and surrounding structures were obtained without intravenous contrast. Angiographic images of the head were obtained using MRA technique without contrast.  COMPARISON:  Prior CT performed earlier on the same day.  FINDINGS: MRI HEAD FINDINGS  Study is degraded by motion artifact.  Diffuse prominence of the CSF containing spaces is compatible with generalized cerebral atrophy. Patchy and confluent T2/FLAIR hyperintensity within the periventricular and deep white matter both cerebral hemispheres is most consistent with chronic small vessel ischemic disease. Encephalomalacia within the anterior right frontal lobe compatible with remote infarct. There is a smaller remote right occipital lobe infarct as well.  Multiple foci of restricted diffusion are seen involving predominantly the deep white matter of the right frontal  lobe and the right centrum semi ovale (series 3, image 34). The largest focus of restricted diffusion measures 11 mm and is seen adjacent to the frontal horn of the right lateral ventricle. There is a small focus of restricted diffusion within the right basal ganglia as well. There is involvement of the periatrial white matter on the right. Findings are consistent with acute ischemic infarcts.  No associated hemorrhage or mass effect. These are position in somewhat of a linear fashion within the deep white matter of the right cerebral hemisphere, and may be watershed in nature.  No mass lesion or midline shift. No hydrocephalus. No extra-axial fluid collection.  Craniocervical junction grossly normal. Node definite abnormality seen within the pituitary gland. Orbits within normal limits.  Visualized bone marrow signal intensity is normal.  Paranasal sinuses and mastoid air cells are clear.  MRA HEAD FINDINGS  ANTERIOR CIRCULATION:  Visualized portions of the distal cervical segments of the internal carotid arteries are widely patent bilaterally with antegrade flow. Mild to moderate multi focal atherosclerotic irregularity seen within the petrous and cavernous ICAs bilaterally without hemodynamically significant stenosis. A 4 mm focal outpouching arising from the lateral aspect of the cavernous left ICA on time-of-flight sequence may reflect a small aneurysm versus artifact (series 12, image 61). This is directed laterally and slightly posteriorly.  A1 segments, anterior communicating artery, and anterior cerebral arteries are opacified bilaterally.  There is a severe high-grade stenosis measuring 4 mm in length within the mid aspect of the right M1 segment (series 12, image 7). Right M1 segment is opacified distally, although flow within the distal right MCA branches is attenuated as compared to the left.  Mild multi focal atherosclerotic irregularity seen within the left MCA artery and its branches without  hemodynamically significant stenosis or proximal branch occlusion.  POSTERIOR CIRCULATION:  Distal vertebral arteries are patent bilaterally. Posterior inferior cerebral arteries not well evaluated on this exam. Vertebrobasilar junction normal. Multi focal atherosclerotic irregularity seen within the basilar artery without hemodynamically significant stenosis. Superior cerebellar arteries patent bilaterally. Multi focal atherosclerotic irregularity seen within the PCAs bilaterally, left slightly worse than right. There is associated probable several areas of moderate stenosis within the left P2 segment.  IMPRESSION: MRI HEAD IMPRESSION:  1. Multi focal ischemic infarcts involving the right MCA territory distribution. These infarcts are largely located in a watershed distribution involving the deep white matter of the right centrum semi ovale. No associated hemorrhage or significant mass effect. 2. Remote right frontal and right occipital lobe infarcts. 3. Atrophy with moderate chronic microvascular ischemic disease.  MRA HEAD IMPRESSION:  1. Focal severe high-grade stenosis measuring approximately 4 mm in length within the mid right M1 segment. Flow is attenuated within the distal right MCA artery branches. 2. Additional multi focal atherosclerotic irregularity throughout the intracranial circulation. 3. 4 mm somewhat linear focal outpouching arising from the lateral aspect of the cavernous left ICA. This may reflect artifact versus a small aneurysm. A follow-up study in 3 months to document stability may be helpful for further evaluation.   Electronically Signed   By: Rise Mu M.D.   On: 11/15/2014 22:50   Mr Brain Wo Contrast  11/15/2014   EXAM: MRI HEAD WITHOUT CONTRAST  MRA HEAD WITHOUT CONTRAST  TECHNIQUE: Multiplanar, multiecho pulse sequences of the brain and surrounding structures were obtained without intravenous contrast. Angiographic images of the head were obtained using MRA technique  without contrast.  COMPARISON:  Prior CT performed earlier on the same day.  FINDINGS: MRI HEAD FINDINGS  Study is degraded by motion artifact.  Diffuse prominence of the CSF containing spaces is compatible with generalized cerebral atrophy. Patchy and confluent T2/FLAIR hyperintensity within the periventricular and deep white matter both cerebral hemispheres is most consistent with chronic small vessel ischemic disease. Encephalomalacia within the anterior right frontal lobe compatible with remote infarct. There is a smaller remote right occipital  lobe infarct as well.  Multiple foci of restricted diffusion are seen involving predominantly the deep white matter of the right frontal lobe and the right centrum semi ovale (series 3, image 34). The largest focus of restricted diffusion measures 11 mm and is seen adjacent to the frontal horn of the right lateral ventricle. There is a small focus of restricted diffusion within the right basal ganglia as well. There is involvement of the periatrial white matter on the right. Findings are consistent with acute ischemic infarcts. No associated hemorrhage or mass effect. These are position in somewhat of a linear fashion within the deep white matter of the right cerebral hemisphere, and may be watershed in nature.  No mass lesion or midline shift. No hydrocephalus. No extra-axial fluid collection.  Craniocervical junction grossly normal. Node definite abnormality seen within the pituitary gland. Orbits within normal limits.  Visualized bone marrow signal intensity is normal.  Paranasal sinuses and mastoid air cells are clear.  MRA HEAD FINDINGS  ANTERIOR CIRCULATION:  Visualized portions of the distal cervical segments of the internal carotid arteries are widely patent bilaterally with antegrade flow. Mild to moderate multi focal atherosclerotic irregularity seen within the petrous and cavernous ICAs bilaterally without hemodynamically significant stenosis. A 4 mm focal  outpouching arising from the lateral aspect of the cavernous left ICA on time-of-flight sequence may reflect a small aneurysm versus artifact (series 12, image 61). This is directed laterally and slightly posteriorly.  A1 segments, anterior communicating artery, and anterior cerebral arteries are opacified bilaterally.  There is a severe high-grade stenosis measuring 4 mm in length within the mid aspect of the right M1 segment (series 12, image 7). Right M1 segment is opacified distally, although flow within the distal right MCA branches is attenuated as compared to the left.  Mild multi focal atherosclerotic irregularity seen within the left MCA artery and its branches without hemodynamically significant stenosis or proximal branch occlusion.  POSTERIOR CIRCULATION:  Distal vertebral arteries are patent bilaterally. Posterior inferior cerebral arteries not well evaluated on this exam. Vertebrobasilar junction normal. Multi focal atherosclerotic irregularity seen within the basilar artery without hemodynamically significant stenosis. Superior cerebellar arteries patent bilaterally. Multi focal atherosclerotic irregularity seen within the PCAs bilaterally, left slightly worse than right. There is associated probable several areas of moderate stenosis within the left P2 segment.  IMPRESSION: MRI HEAD IMPRESSION:  1. Multi focal ischemic infarcts involving the right MCA territory distribution. These infarcts are largely located in a watershed distribution involving the deep white matter of the right centrum semi ovale. No associated hemorrhage or significant mass effect. 2. Remote right frontal and right occipital lobe infarcts. 3. Atrophy with moderate chronic microvascular ischemic disease.  MRA HEAD IMPRESSION:  1. Focal severe high-grade stenosis measuring approximately 4 mm in length within the mid right M1 segment. Flow is attenuated within the distal right MCA artery branches. 2. Additional multi focal  atherosclerotic irregularity throughout the intracranial circulation. 3. 4 mm somewhat linear focal outpouching arising from the lateral aspect of the cavernous left ICA. This may reflect artifact versus a small aneurysm. A follow-up study in 3 months to document stability may be helpful for further evaluation.   Electronically Signed   By: Rise Mu M.D.   On: 11/15/2014 22:50    EKG: Independently reviewed. Normal sinus rhythm.  Assessment/Plan Principal Problem:   Stroke Active Problems:   Chronic diastolic CHF (congestive heart failure)   Left-sided weakness   Slurring of speech   Hypertension  1. Acute CVA with left-sided hemiplegia and left facial droop - at this time patient has been placed on antiplatelet agents. Check carotid Dopplers and get 2-D echo. Check hemoglobin A1c and lipid panel get physical therapy consult. Further recommendations per neurologist. 2. Chronic diastolic heart failure with history of subacute bacterial endocarditis involving the mitral valve - at this time we will hold off diuretics given her acute CVA. Check 2-D echo. 3. Hypertension - continue home medications and given acute CVA allow for permissive hypertension. 4. Chronic kidney disease stage III - follow metabolic panel. 5. History of rheumatoid arthritis on prednisone.    Code Status: Full code.  Family Communication: Patient's family at the bedside.  Disposition Plan: Admit to inpatient.    KAKRAKANDY,ARSHAD N. [facility] Pager [phone].  If 7PM-7AM, please contact night-coverage [email or web address] Password [email or web address] 11/15/2014, 11:11 PM

## 2014-11-15 NOTE — ED Notes (Signed)
Per EMS: Pt from home with symptoms of stroke, left sided deficits.  Pt family noticed left sided facial droop.  LNW 1715. Pt has unequal grip, left facial droop, and decreased sensation to left leg.  Pt was seen last night at [facility] ED for "not acting right," and was d/c.  Pt has no hx of stroke, hx of CHF.  Pt not on blood thinners.  156/66, 80hr, cbg: 165.

## 2014-11-15 NOTE — ED Provider Notes (Signed)
CSN: [id number]     Arrival date & time 11/15/14  1845 History   First MD Initiated Contact with Patient 11/15/14 1906     Chief Complaint  Patient presents with  . Code Stroke     (Consider location/radiation/quality/duration/timing/severity/associated sxs/prior Treatment) Patient is a 80 y.o. female presenting with neurologic complaint. The history is provided by the patient.  Neurologic Problem This is a new problem. Episode onset: 1-2 days ago. The problem occurs constantly. The problem has been gradually worsening. Pertinent negatives include no chest pain, no abdominal pain, no headaches and no shortness of breath. Nothing aggravates the symptoms. Nothing relieves the symptoms. She has tried nothing for the symptoms. The treatment provided no relief.    Past Medical History  Diagnosis Date  . Allergy   . Anxiety   . Insomnia   . Osteoarthritis   . Anemia   . Obesity   . Diastolic dysfunction   . CKD (chronic kidney disease)   . Mitral stenosis     with moderate MR on echo 4/14  . Aortic stenosis     mild by echo 4/14  . Aortic regurgitation     mild to moderate by echo 4/14  . HTN (hypertension)    Past Surgical History  Procedure Laterality Date  . Eye surgery    . Abdominal hysterectomy    . Appendectomy    . Cataract extraction, bilateral    . Tee without cardioversion N/A 04/13/2014    Procedure: TRANSESOPHAGEAL ECHOCARDIOGRAM (TEE);  Surgeon: Vesta Mixer, MD;  Location: [facility] ENDOSCOPY;  Service: Cardiovascular;  Laterality: N/A;   Family History  Problem Relation Age of Onset  . Hypertension Mother   . CVA Mother    History  Substance Use Topics  . Smoking status: Former Smoker -- 0.25 packs/day for 1 years  . Smokeless tobacco: Never Used     Comment: smoked 1-3 cigs/day in school for about a year some days only  . Alcohol Use: No   OB History    No data available     Review of Systems  Constitutional: Negative for fever and fatigue.  HENT:  Negative for congestion and drooling.   Eyes: Negative for pain.  Respiratory: Negative for cough and shortness of breath.   Cardiovascular: Negative for chest pain.  Gastrointestinal: Negative for nausea, vomiting, abdominal pain and diarrhea.  Genitourinary: Negative for dysuria and hematuria.  Musculoskeletal: Negative for back pain, gait problem and neck pain.  Skin: Negative for color change.  Neurological: Positive for weakness (generalized). Negative for dizziness and headaches.  Hematological: Negative for adenopathy.  Psychiatric/Behavioral: Negative for behavioral problems.  All other systems reviewed and are negative.     Allergies  Fentanyl  Home Medications   Prior to Admission medications   Medication Sig Start Date End Date Taking? Authorizing Provider  albuterol (PROVENTIL HFA;VENTOLIN HFA) 108 (90 BASE) MCG/ACT inhaler Inhale 2 puffs into the lungs every 6 (six) hours as needed for shortness of breath.    Historical Provider, MD  ALPRAZolam Prudy Feeler) 0.25 MG tablet Take 1 tablet (0.25 mg total) by mouth 2 (two) times daily as needed for anxiety. 05/24/14   Georgann Housekeeper, MD  aspirin 81 MG chewable tablet Chew 81 mg by mouth daily.     Historical Provider, MD  ceFAZolin (ANCEF) 2-3 GM-% SOLR Inject 50 mLs (2 g total) into the vein every 8 (eight) hours. Patient not taking: Reported on 11/14/2014 04/13/14   Georgann Housekeeper, MD  cholecalciferol (VITAMIN  D) 1000 UNITS tablet Take 1,000 Units by mouth daily.    Historical Provider, MD  doxazosin (CARDURA) 8 MG tablet Take 8 mg by mouth at bedtime.    Historical Provider, MD  ferrous sulfate 325 (65 FE) MG tablet Take 325 mg by mouth daily with breakfast.    Historical Provider, MD  folic acid (FOLVITE) 1 MG tablet Take 1 mg by mouth daily.    Historical Provider, MD  furosemide (LASIX) 20 MG tablet Take 0.5 tablets (10 mg total) by mouth daily. 05/24/14   Georgann Housekeeper, MD  hydrALAZINE (APRESOLINE) 50 MG tablet Take 75 mg by  mouth 3 (three) times daily. Take every 8 hours for hypertension - hold for SBP <110    Historical Provider, MD  Hypromellose (GENTEAL MILD TO MODERATE OP) Place 1 drop into both eyes daily as needed (dry eyes).    Historical Provider, MD  loratadine (CLARITIN) 10 MG tablet Take 10 mg by mouth daily as needed for allergies.    Historical Provider, MD  NON FORMULARY Oxygen---Nightly    Historical Provider, MD  predniSONE (DELTASONE) 5 MG tablet Take 5 mg by mouth daily with breakfast.    Historical Provider, MD  traMADol (ULTRAM) 50 MG tablet Take 1 tablet (50 mg total) by mouth 3 (three) times daily as needed for moderate pain. 05/24/14   Georgann Housekeeper, MD  verapamil (CALAN-SR) 120 MG CR tablet Take 1 tablet (120 mg total) by mouth daily. 05/24/14   Georgann Housekeeper, MD   BP 159/61 mmHg  Pulse 75  Temp(Src) 98.5 F (36.9 C) (Oral)  Resp 20  SpO2 99% Physical Exam  Constitutional: She appears well-developed and well-nourished.  HENT:  Head: Normocephalic and atraumatic.  Mouth/Throat: Oropharynx is clear and moist. No oropharyngeal exudate.  Eyes: Conjunctivae and EOM are normal. Pupils are equal, round, and reactive to light.  Neck: Normal range of motion. Neck supple.  Cardiovascular: Normal rate, regular rhythm, normal heart sounds and intact distal pulses.  Exam reveals no gallop and no friction rub.   No murmur heard. Pulmonary/Chest: Effort normal and breath sounds normal. No respiratory distress. She has no wheezes.  Abdominal: Soft. Bowel sounds are normal. There is no tenderness. There is no rebound and no guarding.  Musculoskeletal: Normal range of motion. She exhibits no edema or tenderness.  Neurological: She is alert.  Left sided facial droop. Mild slurring of speech.  Skin: Skin is warm and dry.  Psychiatric: She has a normal mood and affect. Her behavior is normal.  Nursing note and vitals reviewed.   ED Course  Procedures (including critical care time) Labs Review Labs  Reviewed  CBC - Abnormal; Notable for the following:    RBC 3.72 (*)    Hemoglobin 11.8 (*)    RDW 17.0 (*)    All other components within normal limits  COMPREHENSIVE METABOLIC PANEL - Abnormal; Notable for the following:    Glucose, Bld 141 (*)    GFR calc non Af Amer 63 (*)    GFR calc Af Amer 72 (*)    All other components within normal limits  I-STAT CHEM 8, ED - Abnormal; Notable for the following:    Glucose, Bld 144 (*)    All other components within normal limits  ETHANOL  PROTIME-INR  APTT  DIFFERENTIAL  URINE RAPID DRUG SCREEN (HOSP PERFORMED)  URINALYSIS, ROUTINE W REFLEX MICROSCOPIC  HEMOGLOBIN A1C  LIPID PANEL  COMPREHENSIVE METABOLIC PANEL  CBC WITH DIFFERENTIAL  TSH  I-STAT TROPOININ,  ED  Rosezena Sensor, ED    Imaging Review Ct Head Wo Contrast  11/15/2014   CLINICAL DATA:  Left-sided weakness, stroke protocol  EXAM: CT HEAD WITHOUT CONTRAST  TECHNIQUE: Contiguous axial images were obtained from the base of the skull through the vertex without intravenous contrast.  COMPARISON:  11/14/2014  FINDINGS: There is no evidence of mass effect, midline shift, or extra-axial fluid collections. There is no evidence of a space-occupying lesion or intracranial hemorrhage. There is no evidence of a cortical-based area of acute infarction. Old right frontal lobe infarct with encephalomalacia. There is an old right cerebellar infarct. There is generalized cerebral atrophy. There is periventricular white matter low attenuation likely secondary to microangiopathy.  The ventricles and sulci are appropriate for the patient's age. The basal cisterns are patent.  Visualized portions of the orbits are unremarkable. The visualized portions of the paranasal sinuses and mastoid air cells are unremarkable. Cerebrovascular atherosclerotic calcifications are noted.  The osseous structures are unremarkable.  IMPRESSION: 1. No acute intracranial pathology. 2. Old right frontal lobe infarct. 3.  Chronic microvascular disease and cerebral atrophy. These results were called by telephone at the time of interpretation on 11/15/2014 at 7:13 pm to Dr. Thad Ranger, who verbally acknowledged these results.   Electronically Signed   By: Elige Ko   On: 11/15/2014 19:19   Ct Head Wo Contrast  11/15/2014   CLINICAL DATA:  Altered mental status.  EXAM: CT HEAD WITHOUT CONTRAST  TECHNIQUE: Contiguous axial images were obtained from the base of the skull through the vertex without intravenous contrast.  COMPARISON:  CT scan November 17, 2013.  FINDINGS: Bony calvarium appears intact. Mild chronic ischemic white matter disease is noted. Stable subcortical low density is noted in right frontal lobe consistent with old infarction. No mass effect or midline shift is noted. Ventricular size is within normal limits. There is no evidence of mass lesion, hemorrhage or acute infarction.  IMPRESSION: Mild chronic ischemic white matter disease. 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 No acute intracranial abnormality seen.   Electronically Signed   By: Roque Lias M.D.   On: 11/15/2014 00:22     EKG Interpretation   Date/Time:  Monday November 15 2014 19:04:15 EST Ventricular Rate:  77 PR Interval:  194 QRS Duration: 91 QT Interval:  552 QTC Calculation: 625 R Axis:   66 Text Interpretation:  Sinus rhythm Borderline T abnormalities, diffuse  leads Prolonged QT interval No significant change since last tracing  Confirmed by Girolamo Lortie  MD, Damontre Millea ([id number]) on 11/15/2014 7:06:44 PM      MDM   Final diagnoses:  Slurring of speech  Facial droop    7:28 PM 80 y.o. female w hx of CKD, CHF, aortic stenosis, HTN who presents with slurring of speech and left-sided facial weakness. The family states that she has had some slurring of speech for the last 2 days. The daughter notes that the left-sided facial droop appeared most prominent earlier this morning. They called her PCP who recommended she go  to the ER for repeat evaluation. She presented as a code stroke. CT negative. NIHSS of 4. Neurology recommends admission to medicine.    Purvis Sheffield, MD 11/16/14 [phone]

## 2014-11-15 NOTE — Consult Note (Addendum)
Referring Physician: ED    Chief Complaint: codes stroke, left hemiparesis, left face weakness, dysarthria  HPI:                                                                                                                                         Monica Blankenship is an 80 y.o. female, left handed, with a past medical history significant for HTN, chronic congestive heart failure, CKD, OA, brought in via EMS as a code stroke due to acute onset of the above stated symptoms/signs. She lives at home and review of her medical records indicate that she was evaluated last night at [facility] due to altered mental status and c/o feeling generally weak, fatigue for the past day, but there is not documentation of a focal neurological finding while being at [facility]. Patient was discharged home from the ED and she said that around 3 pm today she was eating a cheeseburger and noticed her tongue felt "thick". Reportedly Pt family noticed left sided facial droop aroud 1715 today. Upon arrival to the ED noted to have mild left hemiparesis, left face weakness, and dysarthria for a NIHSS 4. CT brain showed no acute intracranial abnormality. Presently, she is alert and answering questions appropriately. Denies HA, vertigo, double vision, difficulty swallowing, or visual disturbances. Date last known well: uncertain Time last known well: uncertain tPA Given: no, unclear last known well, mild deficits. NIHSS: 4   Past Medical History  Diagnosis Date  . Allergy   . Anxiety   . Insomnia   . Osteoarthritis   . Anemia   . Obesity   . Diastolic dysfunction   . CKD (chronic kidney disease)   . Mitral stenosis     with moderate MR on echo 4/14  . Aortic stenosis     mild by echo 4/14  . Aortic regurgitation     mild to moderate by echo 4/14  . HTN (hypertension)     Past Surgical History  Procedure Laterality Date  . Eye surgery    . Abdominal hysterectomy    . Appendectomy    . Cataract extraction, bilateral     . Tee without cardioversion N/A 04/13/2014    Procedure: TRANSESOPHAGEAL ECHOCARDIOGRAM (TEE);  Surgeon: Vesta Mixer, MD;  Location: [facility] ENDOSCOPY;  Service: Cardiovascular;  Laterality: N/A;    Family History  Problem Relation Age of Onset  . Hypertension Mother   . CVA Mother    Social History:  reports that she has quit smoking. She has never used smokeless tobacco. She reports that she does not drink alcohol or use illicit drugs.  Allergies:  Allergies  Allergen Reactions  . Fentanyl Other (See Comments)    confusion    Medications:  Scheduled:  ROS:                                                                                                                                       History obtained from chart review and patient.  General ROS: negative for - chills, fever, night sweats, weight gain or weight loss Psychological ROS: negative for - behavioral disorder, hallucinations, memory difficulties, mood swings or suicidal ideation Ophthalmic ROS: negative for - blurry vision, double vision, eye pain or loss of vision ENT ROS: negative for - epistaxis, nasal discharge, oral lesions, sore throat, tinnitus or vertigo Allergy and Immunology ROS: negative for - hives or itchy/watery eyes Hematological and Lymphatic ROS: negative for - bleeding problems, bruising or swollen lymph nodes Endocrine ROS: negative for - galactorrhea, hair pattern changes, polydipsia/polyuria or temperature intolerance Respiratory ROS: negative for - cough, hemoptysis, shortness of breath or wheezing Cardiovascular ROS: negative for - chest pain, dyspnea on exertion, edema or irregular heartbeat Gastrointestinal ROS: negative for - abdominal pain, diarrhea, hematemesis, nausea/vomiting or stool incontinence Genito-Urinary ROS: negative for - dysuria, hematuria, incontinence or  urinary frequency/urgency Musculoskeletal ROS: negative for - joint swelling Neurological ROS: as noted in HPI Dermatological ROS: negative for rash and skin lesion changes  Physical exam: pleasant female in no apparent distress. Blood pressure 159/61, pulse 75, temperature 98.5 F (36.9 C), temperature source Oral, resp. rate 20, SpO2 99 %. Head: normocephalic. Neck: supple, no bruits, no JVD. Cardiac: no murmurs. Lungs: clear. Abdomen: soft, no tender, no mass. Extremities: no edema. Neurologic Examination:                                                                                                      General: Mental Status: Alert, oriented, thought content appropriate.  Speech dysarthric without evidence of aphasia.  Able to follow 3 step commands without difficulty. Cranial Nerves: II: Discs flat bilaterally; Visual fields grossly normal, pupils equal, round, reactive to light and accommodation III,IV, VI: ptosis not present, extra-ocular motions intact bilaterally V,VII: smile asymmetric due to left face weakness, facial light touch sensation normal bilaterally VIII: hearing normal bilaterally IX,X: gag reflex present XI: bilateral shoulder shrug XII: midline tongue extension without atrophy or fasciculations Motor: Significant for mild left hemiparesis. Tone and bulk:normal tone throughout; no atrophy noted Sensory: Pinprick and light touch intact throughout, bilaterally Deep Tendon Reflexes:  Right: Upper Extremity   Left: Upper extremity   biceps (C-5 to C-6) 2/4   biceps (C-5 to C-6)  2/4 tricep (C7) 2/4    triceps (C7) 2/4 Brachioradialis (C6) 2/4  Brachioradialis (C6) 2/4  Lower Extremity Lower Extremity  quadriceps (L-2 to L-4) 2/4   quadriceps (L-2 to L-4) 2/4 Achilles (S1) 2/4   Achilles (S1) 2/4  Plantars: Right: downgoing   Left: downgoing Cerebellar: normal finger-to-nose,  normal heel-to-shin test Gait:  No tested due to safety reasons.  Results  for orders placed or performed during the hospital encounter of 11/15/14 (from the past 48 hour(s))  Protime-INR     Status: None   Collection Time: 11/15/14  6:51 PM  Result Value Ref Range   Prothrombin Time 14.6 11.6 - 15.2 seconds   INR 1.12 0.00 - 1.49  APTT     Status: None   Collection Time: 11/15/14  6:51 PM  Result Value Ref Range   aPTT 29 24 - 37 seconds  CBC     Status: Abnormal   Collection Time: 11/15/14  6:51 PM  Result Value Ref Range   WBC 8.3 4.0 - 10.5 K/uL   RBC 3.72 (L) 3.87 - 5.11 MIL/uL   Hemoglobin 11.8 (L) 12.0 - 15.0 g/dL   HCT 95.3 20.2 - 33.4 %   MCV 97.0 78.0 - 100.0 fL   MCH 31.7 26.0 - 34.0 pg   MCHC 32.7 30.0 - 36.0 g/dL   RDW 35.6 (H) 86.1 - 68.3 %   Platelets 184 150 - 400 K/uL  Differential     Status: None   Collection Time: 11/15/14  6:51 PM  Result Value Ref Range   Neutrophils Relative % 70 43 - 77 %   Neutro Abs 5.9 1.7 - 7.7 K/uL   Lymphocytes Relative 22 12 - 46 %   Lymphs Abs 1.8 0.7 - 4.0 K/uL   Monocytes Relative 6 3 - 12 %   Monocytes Absolute 0.5 0.1 - 1.0 K/uL   Eosinophils Relative 1 0 - 5 %   Eosinophils Absolute 0.1 0.0 - 0.7 K/uL   Basophils Relative 1 0 - 1 %   Basophils Absolute 0.0 0.0 - 0.1 K/uL  I-Stat Troponin, ED (not at [facility])     Status: None   Collection Time: 11/15/14  7:03 PM  Result Value Ref Range   Troponin i, poc 0.01 0.00 - 0.08 ng/mL   Comment 3            Comment: Due to the release kinetics of cTnI, a negative result within the first hours of the onset of symptoms does not rule out myocardial infarction with certainty. If myocardial infarction is still suspected, repeat the test at appropriate intervals.   I-Stat Chem 8, ED     Status: Abnormal   Collection Time: 11/15/14  7:05 PM  Result Value Ref Range   Sodium 141 137 - 147 mEq/L   Potassium 4.0 3.7 - 5.3 mEq/L   Chloride 104 96 - 112 mEq/L   BUN 14 6 - 23 mg/dL   Creatinine, Ser 7.29 0.50 - 1.10 mg/dL   Glucose, Bld 021 (H) 70 - 99 mg/dL    Calcium, Ion 1.15 5.20 - 1.30 mmol/L   TCO2 24 0 - 100 mmol/L   Hemoglobin 13.3 12.0 - 15.0 g/dL   HCT 80.2 23.3 - 61.2 %   Ct Head Wo Contrast  11/15/2014   CLINICAL DATA:  Left-sided weakness, stroke protocol  EXAM: CT HEAD WITHOUT CONTRAST  TECHNIQUE: Contiguous axial images were obtained from the base of the skull through the vertex without  intravenous contrast.  COMPARISON:  11/14/2014  FINDINGS: There is no evidence of mass effect, midline shift, or extra-axial fluid collections. There is no evidence of a space-occupying lesion or intracranial hemorrhage. There is no evidence of a cortical-based area of acute infarction. Old right frontal lobe infarct with encephalomalacia. There is an old right cerebellar infarct. There is generalized cerebral atrophy. There is periventricular white matter low attenuation likely secondary to microangiopathy.  The ventricles and sulci are appropriate for the patient's age. The basal cisterns are patent.  Visualized portions of the orbits are unremarkable. The visualized portions of the paranasal sinuses and mastoid air cells are unremarkable. Cerebrovascular atherosclerotic calcifications are noted.  The osseous structures are unremarkable.  IMPRESSION: 1. No acute intracranial pathology. 2. Old right frontal lobe infarct. 3. Chronic microvascular disease and cerebral atrophy. These results were called by telephone at the time of interpretation on 11/15/2014 at 7:13 pm to Dr. Thad Ranger, who verbally acknowledged these results.   Electronically Signed   By: Elige Ko   On: 11/15/2014 19:19   Ct Head Wo Contrast  11/15/2014   CLINICAL DATA:  Altered mental status.  EXAM: CT HEAD WITHOUT CONTRAST  TECHNIQUE: Contiguous axial images were obtained from the base of the skull through the vertex without intravenous contrast.  COMPARISON:  CT scan November 17, 2013.  FINDINGS: Bony calvarium appears intact. Mild chronic ischemic white matter disease is noted. Stable  subcortical low density is noted in right frontal lobe consistent with old infarction. No mass effect or midline shift is noted. Ventricular size is within normal limits. There is no evidence of mass lesion, hemorrhage or acute infarction.  IMPRESSION: Mild chronic ischemic white matter disease. 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 No acute intracranial abnormality seen.   Electronically Signed   By: Roque Lias M.D.   On: 11/15/2014 00:22    Assessment: 80 y.o. female with a neurological syndrome consistent with a right brain stroke, likely subcortical lacunar infarct. NIHSS 4, CT brain unremarkable for acute abnormality. Uncertain time of onset thus thrombolytic treatment was not administered. Admit to medicine. Complete stroke work up.  Initiate aspirin 81 mg daily after passing swallowing evaluation. Stroke team will follow up in the morning.   Stroke Risk Factors - age, HTN, CHF, CKD.  Plan: 1. HgbA1c, fasting lipid panel 2. MRI, MRA  of the brain without contrast 3. Echocardiogram 4. Carotid dopplers 5. Prophylactic therapy-aspirin after passing swallowing evaluation 6. Risk factor modification 7. Telemetry monitoring 8. Frequent neuro checks 9. PT/OT SLP   Wyatt Portela, MD [facility] [phone]  11/15/2014, 7:21 PM

## 2014-11-15 NOTE — ED Notes (Signed)
MRI called stated patient not tolerated MRI due to claustrophobia Doctor notified.

## 2014-11-15 NOTE — ED Notes (Addendum)
Pt from home with symptoms of stroke, left sided deficits.  Pt family noticed left sided facial droop.  LNW 1715. Pt has unequal grip, left facial droop, and decreased sensation to left leg.  Pt was seen last night at [facility] ED for "not acting right," and was d/c.  Pt has no hx of stroke, hx of CHF.  Pt not on blood thinners. Pt noticed at lunch today that tongue felt swollen.

## 2014-11-15 NOTE — Progress Notes (Signed)
Pt just arrived from ED with stroke like symptoms,pt settled in bed, telemetry monitor put on pt, v/s stable,will continue to monitor. Monica Blankenship, Darolyn Double Efe

## 2014-11-15 NOTE — Code Documentation (Signed)
Monica Blankenship is an 80 yo bf presenting to the [facility] with a 1 day hx of AMS and newer onset slurred speech & Lt side weakness.  Per report she was seen at [facility] yesterday for AMS and sent home.  No documentation of focal findings at that time.  Pt states around 3pm she was eating a cheeseburger and noticed her tongue felt "thick".  Family reports her speech has been slurred since yesterday.  On assessment she has Lt side weakness & mild dysarthria though she does not have her dentures in.  NIH 4.  Pt is outside the window for acute stroke treatment.

## 2014-11-15 NOTE — Discharge Instructions (Signed)
It was our pleasure to provide your ER care today - we hope that you feel better.  Rest. Drink plenty of fluids.  Follow up with primary care doctor in the next 1-2 days if symptoms fail to improve/resolve.   Return to ER if worse, new symptoms, fevers, chest pain, trouble breathing, unilateral numbness/weakness, change in speech or vision, other concern.      Altered Mental Status Altered mental status most often refers to an abnormal change in your responsiveness and awareness. It can affect your speech, thought, mobility, memory, attention span, or alertness. It can range from slight confusion to complete unresponsiveness (coma). Altered mental status can be a sign of a serious underlying medical condition. Rapid evaluation and medical treatment is necessary for patients having an altered mental status. CAUSES   Low blood sugar (hypoglycemia) or diabetes.  Severe loss of body fluids (dehydration) or a body salt (electrolyte) imbalance.  A stroke or other neurologic problem, such as dementia or delirium.  A head injury or tumor.  A drug or alcohol overdose.  Exposure to toxins or poisons.  Depression, anxiety, and stress.  A low oxygen level (hypoxia).  An infection.  Blood loss.  Twitching or shaking (seizure).  Heart problems, such as heart attack or heart rhythm problems (arrhythmias).  A body temperature that is too low or too high (hypothermia or hyperthermia). DIAGNOSIS  A diagnosis is based on your history, symptoms, physical and neurologic examinations, and diagnostic tests. Diagnostic tests may include:  Measurement of your blood pressure, pulse, breathing, and oxygen levels (vital signs).  Blood tests.  Urine tests.  X-ray exams.  A computerized magnetic scan (magnetic resonance imaging, MRI).  A computerized X-ray scan (computed tomography, CT scan). TREATMENT  Treatment will depend on the cause. Treatment may include:  Management of an underlying  medical or mental health condition.  Critical care or support in the hospital. HOME CARE INSTRUCTIONS   Only take over-the-counter or prescription medicines for pain, discomfort, or fever as directed by your caregiver.  Manage underlying conditions as directed by your caregiver.  Eat a healthy, well-balanced diet to maintain strength.  Join a support group or prevention program to cope with the condition or trauma that caused the altered mental status. Ask your caregiver to help choose a program that works for you.  Follow up with your caregiver for further examination, therapy, or testing as directed. SEEK MEDICAL CARE IF:   You feel unwell or have chills.  You or your family notice a change in your behavior or your alertness.  You have trouble following your caregiver's treatment plan.  You have questions or concerns. SEEK IMMEDIATE MEDICAL CARE IF:   You have a rapid heartbeat or have chest pain.  You have difficulty breathing.  You have a fever.  You have a headache with a stiff neck.  You cough up blood.  You have blood in your urine or stool.  You have severe agitation or confusion. MAKE SURE YOU:   Understand these instructions.  Will watch your condition.  Will get help right away if you are not doing well or get worse. Document Released: 05/09/2010 Document Revised: 02/11/2012 Document Reviewed: 05/09/2010 [facility] Patient Information 2015 [place], [place]. This information is not intended to replace advice given to you by your health care provider. Make sure you discuss any questions you have with your health care provider.     Weakness Weakness is a lack of strength. It may be felt all over the body (  generalized) or in one specific part of the body (focal). Some causes of weakness can be serious. You may need further medical evaluation, especially if you are elderly or you have a history of immunosuppression (such as chemotherapy or HIV), kidney disease,  heart disease, or diabetes. CAUSES  Weakness can be caused by many different things, including:  Infection.  Physical exhaustion.  Internal bleeding or other blood loss that results in a lack of red blood cells (anemia).  Dehydration. This cause is more common in elderly people.  Side effects or electrolyte abnormalities from medicines, such as pain medicines or sedatives.  Emotional distress, anxiety, or depression.  Circulation problems, especially severe peripheral arterial disease.  Heart disease, such as rapid atrial fibrillation, bradycardia, or heart failure.  Nervous system disorders, such as Guillain-Barr syndrome, multiple sclerosis, or stroke. DIAGNOSIS  To find the cause of your weakness, your caregiver will take your history and perform a physical exam. Lab tests or X-rays may also be ordered, if needed. TREATMENT  Treatment of weakness depends on the cause of your symptoms and can vary greatly. HOME CARE INSTRUCTIONS   Rest as needed.  Eat a well-balanced diet.  Try to get some exercise every day.  Only take over-the-counter or prescription medicines as directed by your caregiver. SEEK MEDICAL CARE IF:   Your weakness seems to be getting worse or spreads to other parts of your body.  You develop new aches or pains. SEEK IMMEDIATE MEDICAL CARE IF:   You cannot perform your normal daily activities, such as getting dressed and feeding yourself.  You cannot walk up and down stairs, or you feel exhausted when you do so.  You have shortness of breath or chest pain.  You have difficulty moving parts of your body.  You have weakness in only one area of the body or on only one side of the body.  You have a fever.  You have trouble speaking or swallowing.  You cannot control your bladder or bowel movements.  You have black or bloody vomit or stools. MAKE SURE YOU:  Understand these instructions.  Will watch your condition.  Will get help right away  if you are not doing well or get worse. Document Released: 11/19/2005 Document Revised: 05/20/2012 Document Reviewed: 01/18/2012 [facility] Patient Information 2015 [place], [place]. This information is not intended to replace advice given to you by your health care provider. Make sure you discuss any questions you have with your health care provider.

## 2014-11-16 ENCOUNTER — Telehealth: Payer: Self-pay | Admitting: Neurology

## 2014-11-16 ENCOUNTER — Inpatient Hospital Stay (HOSPITAL_COMMUNITY): Payer: Medicare PPO

## 2014-11-16 DIAGNOSIS — I059 Rheumatic mitral valve disease, unspecified: Secondary | ICD-10-CM

## 2014-11-16 LAB — LIPID PANEL
CHOL/HDL RATIO: 3.2 ratio
CHOLESTEROL: 129 mg/dL (ref 0–200)
HDL: 40 mg/dL (ref >39)
LDL Cholesterol: 75 mg/dL (ref 0–99)
Triglycerides: 69 mg/dL (ref <150)
VLDL: 14 mg/dL (ref 0–40)

## 2014-11-16 LAB — COMPREHENSIVE METABOLIC PANEL
ALBUMIN: 3.6 g/dL (ref 3.5–5.2)
ALT: 14 U/L (ref 0–35)
ANION GAP: 12 (ref 5–15)
AST: 15 U/L (ref 0–37)
Alkaline Phosphatase: 70 U/L (ref 39–117)
BUN: 11 mg/dL (ref 6–23)
CO2: 25 mEq/L (ref 19–32)
Calcium: 9.7 mg/dL (ref 8.4–10.5)
Chloride: 106 mEq/L (ref 96–112)
Creatinine, Ser: 0.8 mg/dL (ref 0.50–1.10)
GFR calc non Af Amer: 66 mL/min — ABNORMAL LOW (ref >90)
GFR, EST AFRICAN AMERICAN: 77 mL/min — AB (ref >90)
Glucose, Bld: 93 mg/dL (ref 70–99)
Potassium: 3.9 mEq/L (ref 3.7–5.3)
Sodium: 143 mEq/L (ref 137–147)
TOTAL PROTEIN: 7.4 g/dL (ref 6.0–8.3)
Total Bilirubin: 0.6 mg/dL (ref 0.3–1.2)

## 2014-11-16 LAB — CBC WITH DIFFERENTIAL/PLATELET
BASOS PCT: 0 % (ref 0–1)
Basophils Absolute: 0 10*3/uL (ref 0.0–0.1)
Eosinophils Absolute: 0.1 10*3/uL (ref 0.0–0.7)
Eosinophils Relative: 1 % (ref 0–5)
HEMATOCRIT: 36.1 % (ref 36.0–46.0)
HEMOGLOBIN: 11.7 g/dL — AB (ref 12.0–15.0)
LYMPHS ABS: 1.9 10*3/uL (ref 0.7–4.0)
LYMPHS PCT: 28 % (ref 12–46)
MCH: 31.5 pg (ref 26.0–34.0)
MCHC: 32.4 g/dL (ref 30.0–36.0)
MCV: 97.3 fL (ref 78.0–100.0)
MONOS PCT: 11 % (ref 3–12)
Monocytes Absolute: 0.8 10*3/uL (ref 0.1–1.0)
NEUTROS ABS: 4.1 10*3/uL (ref 1.7–7.7)
NEUTROS PCT: 60 % (ref 43–77)
Platelets: 189 10*3/uL (ref 150–400)
RBC: 3.71 MIL/uL — ABNORMAL LOW (ref 3.87–5.11)
RDW: 17.1 % — ABNORMAL HIGH (ref 11.5–15.5)
WBC: 6.8 10*3/uL (ref 4.0–10.5)

## 2014-11-16 LAB — HEMOGLOBIN A1C
Hgb A1c MFr Bld: 5.7 % — ABNORMAL HIGH (ref <5.7)
MEAN PLASMA GLUCOSE: 117 mg/dL — AB (ref <117)

## 2014-11-16 LAB — TSH: TSH: 1.65 u[IU]/mL (ref 0.350–4.500)

## 2014-11-16 NOTE — Progress Notes (Signed)
STROKE TEAM PROGRESS NOTE   HISTORY PATCHES MCDONNELL is an 80 y.o. female, left handed, with a past medical history significant for HTN, chronic congestive heart failure, CKD, OA, brought in via EMS as a code stroke due to acute onset of left hemiparesis, left facial weakness and dysarthria. She lives at home and review of her medical records indicate that she was evaluated last night at [facility] due to altered mental status and c/o feeling generally weak, fatigue for the past day, but there is not documentation of a focal neurological finding while being at [facility]. Patient was discharged home from the ED and she said that around 3 pm today 12/14/2015she was eating a cheeseburger and noticed her tongue felt "thick". Reportedly Pt family noticed left sided facial droop aroud 1715 today 11/15/2014. Upon arrival to the ED noted to have mild left hemiparesis, left face weakness, and dysarthria for a NIHSS 4. CT brain showed no acute intracranial abnormality. In the ED she was alert and answering questions appropriately. Denies HA, vertigo, double vision, difficulty swallowing, or visual disturbances. They were unable to pinpoint last known well. TPA was not administered secondary to unclear last known well, and mild deficits. She was admitted for further evaluation and treatment.   SUBJECTIVE (INTERVAL HISTORY) Patient still has mild persistent left-sided weakness and speech is improving. There is no family at the bedside  OBJECTIVE Temp:  [98.1 F (36.7 C)-99 F (37.2 C)] 98.4 F (36.9 C) (12/15 1755) Pulse Rate:  [55-80] 77 (12/15 1755) Cardiac Rhythm:  [-] Normal sinus rhythm (12/15 1000) Resp:  [13-20] 16 (12/15 0658) BP: (129-182)/(39-89) 140/58 mmHg (12/15 1807) SpO2:  [96 %-99 %] 96 % (12/15 1755) Weight:  [79.379 kg (175 lb)] 79.379 kg (175 lb) (12/14 2250)  No results for input(s): GLUCAP in the last 168 hours.  Recent Labs Lab 11/14/14 2030 11/15/14 1851 11/15/14 1905 11/16/14 0620   NA 141 140 141 143  K 4.6 4.1 4.0 3.9  CL 102 102 104 106  CO2 25 25  --  25  GLUCOSE 148* 141* 144* 93  BUN CREATININE 0.95 0.84 1.00 0.80  CALCIUM 10.2 9.7  --  9.7    Recent Labs Lab 11/14/14 2030 11/15/14 1851 11/16/14 0620  AST ALT ALKPHOS 73 73 70  BILITOT 0.6 0.5 0.6  PROT 8.5* 7.8 7.4  ALBUMIN 4.1 3.7 3.6    Recent Labs Lab 11/14/14 2030 11/15/14 1851 11/15/14 1905 11/16/14 0620  WBC 7.4 8.3  --  6.8  NEUTROABS  --  5.9  --  4.1  HGB 12.1 11.8* 13.3 11.7*  HCT 36.9 36.1 39.0 36.1  MCV 98.4 97.0  --  97.3  PLT 199 184  --  189   No results for input(s): CKTOTAL, CKMB, CKMBINDEX, TROPONINI in the last 168 hours.  Recent Labs  11/15/14 1851  LABPROT 14.6  INR 1.12    Recent Labs  11/14/14 2136 11/15/14 1948  COLORURINE YELLOW YELLOW  LABSPEC 1.006 1.008  PHURINE 7.0 7.0  GLUCOSEU NEGATIVE NEGATIVE  HGBUR NEGATIVE NEGATIVE  BILIRUBINUR NEGATIVE NEGATIVE  KETONESUR NEGATIVE NEGATIVE  PROTEINUR NEGATIVE NEGATIVE  UROBILINOGEN 0.2 0.2  NITRITE NEGATIVE NEGATIVE  LEUKOCYTESUR NEGATIVE NEGATIVE       Component Value Date/Time   CHOL 129 11/16/2014 0620   TRIG 69 11/16/2014 0620   HDL 40 11/16/2014 0620   CHOLHDL 3.2 11/16/2014 0620   VLDL 14 11/16/2014 1610  LDLCALC 75 11/16/2014 0620   Lab Results  Component Value Date   HGBA1C 5.7* 11/15/2014      Component Value Date/Time   LABOPIA NONE DETECTED 11/15/2014 1948   COCAINSCRNUR NONE DETECTED 11/15/2014 1948   LABBENZ NONE DETECTED 11/15/2014 1948   AMPHETMU NONE DETECTED 11/15/2014 1948   THCU NONE DETECTED 11/15/2014 1948   LABBARB NONE DETECTED 11/15/2014 1948     Recent Labs Lab 11/15/14 1851  ETH <11    Dg Chest 2 View  11/16/2014   CLINICAL DATA:  Hypertension.  Stroke symptoms.  EXAM: CHEST  2 VIEW  COMPARISON:  Chest radiograph 09/16/2014.  FINDINGS: Stable enlarged cardiac and mediastinal contours. Mild pulmonary venous  hypertension. Minimal left basilar heterogeneous pulmonary opacities. Motion artifact limits evaluation on the lateral view. Mid thoracic spine degenerative changes.  IMPRESSION: Minimal heterogeneous opacities left lung base may represent atelectasis. Infection not excluded.  Cardiomegaly with pulmonary venous hypertension.   Electronically Signed   By: Annia Belt M.D.   On: 11/16/2014 15:16   Ct Head Wo Contrast  11/15/2014   CLINICAL DATA:  Left-sided weakness, stroke protocol  EXAM: CT HEAD WITHOUT CONTRAST  TECHNIQUE: Contiguous axial images were obtained from the base of the skull through the vertex without intravenous contrast.  COMPARISON:  11/14/2014  FINDINGS: There is no evidence of mass effect, midline shift, or extra-axial fluid collections. There is no evidence of a space-occupying lesion or intracranial hemorrhage. There is no evidence of a cortical-based area of acute infarction. Old right frontal lobe infarct with encephalomalacia. There is an old right cerebellar infarct. There is generalized cerebral atrophy. There is periventricular white matter low attenuation likely secondary to microangiopathy.  The ventricles and sulci are appropriate for the patient's age. The basal cisterns are patent.  Visualized portions of the orbits are unremarkable. The visualized portions of the paranasal sinuses and mastoid air cells are unremarkable. Cerebrovascular atherosclerotic calcifications are noted.  The osseous structures are unremarkable.  IMPRESSION: 1. No acute intracranial pathology. 2. Old right frontal lobe infarct. 3. Chronic microvascular disease and cerebral atrophy. These results were called by telephone at the time of interpretation on 11/15/2014 at 7:13 pm to Dr. Thad Ranger, who verbally acknowledged these results.   Electronically Signed   By: Elige Ko   On: 11/15/2014 19:19   Ct Head Wo Contrast  11/15/2014   CLINICAL DATA:  Altered mental status.  EXAM: CT HEAD WITHOUT CONTRAST   TECHNIQUE: Contiguous axial images were obtained from the base of the skull through the vertex without intravenous contrast.  COMPARISON:  CT scan November 17, 2013.  FINDINGS: Bony calvarium appears intact. Mild chronic ischemic white matter disease is noted. Stable subcortical low density is noted in right frontal lobe consistent with old infarction. No mass effect or midline shift is noted. Ventricular size is within normal limits. There is no evidence of mass lesion, hemorrhage or acute infarction.  IMPRESSION: Mild chronic ischemic white matter disease. 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 No acute intracranial abnormality seen.   Electronically Signed   By: Roque Lias M.D.   On: 11/15/2014 00:22   Mr Maxine Glenn Head Wo Contrast  11/15/2014   EXAM: MRI HEAD WITHOUT CONTRAST  MRA HEAD WITHOUT CONTRAST  TECHNIQUE: Multiplanar, multiecho pulse sequences of the brain and surrounding structures were obtained without intravenous contrast. Angiographic images of the head were obtained using MRA technique without contrast.  COMPARISON:  Prior CT performed earlier on the same day.  FINDINGS: MRI  HEAD FINDINGS  Study is degraded by motion artifact.  Diffuse prominence of the CSF containing spaces is compatible with generalized cerebral atrophy. Patchy and confluent T2/FLAIR hyperintensity within the periventricular and deep white matter both cerebral hemispheres is most consistent with chronic small vessel ischemic disease. Encephalomalacia within the anterior right frontal lobe compatible with remote infarct. There is a smaller remote right occipital lobe infarct as well.  Multiple foci of restricted diffusion are seen involving predominantly the deep white matter of the right frontal lobe and the right centrum semi ovale (series 3, image 34). The largest focus of restricted diffusion measures 11 mm and is seen adjacent to the frontal horn of the right lateral ventricle. There is a small focus of  restricted diffusion within the right basal ganglia as well. There is involvement of the periatrial white matter on the right. Findings are consistent with acute ischemic infarcts. No associated hemorrhage or mass effect. These are position in somewhat of a linear fashion within the deep white matter of the right cerebral hemisphere, and may be watershed in nature.  No mass lesion or midline shift. No hydrocephalus. No extra-axial fluid collection.  Craniocervical junction grossly normal. Node definite abnormality seen within the pituitary gland. Orbits within normal limits.  Visualized bone marrow signal intensity is normal.  Paranasal sinuses and mastoid air cells are clear.  MRA HEAD FINDINGS  ANTERIOR CIRCULATION:  Visualized portions of the distal cervical segments of the internal carotid arteries are widely patent bilaterally with antegrade flow. Mild to moderate multi focal atherosclerotic irregularity seen within the petrous and cavernous ICAs bilaterally without hemodynamically significant stenosis. A 4 mm focal outpouching arising from the lateral aspect of the cavernous left ICA on time-of-flight sequence may reflect a small aneurysm versus artifact (series 12, image 61). This is directed laterally and slightly posteriorly.  A1 segments, anterior communicating artery, and anterior cerebral arteries are opacified bilaterally.  There is a severe high-grade stenosis measuring 4 mm in length within the mid aspect of the right M1 segment (series 12, image 7). Right M1 segment is opacified distally, although flow within the distal right MCA branches is attenuated as compared to the left.  Mild multi focal atherosclerotic irregularity seen within the left MCA artery and its branches without hemodynamically significant stenosis or proximal branch occlusion.  POSTERIOR CIRCULATION:  Distal vertebral arteries are patent bilaterally. Posterior inferior cerebral arteries not well evaluated on this exam.  Vertebrobasilar junction normal. Multi focal atherosclerotic irregularity seen within the basilar artery without hemodynamically significant stenosis. Superior cerebellar arteries patent bilaterally. Multi focal atherosclerotic irregularity seen within the PCAs bilaterally, left slightly worse than right. There is associated probable several areas of moderate stenosis within the left P2 segment.  IMPRESSION: MRI HEAD IMPRESSION:  1. Multi focal ischemic infarcts involving the right MCA territory distribution. These infarcts are largely located in a watershed distribution involving the deep white matter of the right centrum semi ovale. No associated hemorrhage or significant mass effect. 2. Remote right frontal and right occipital lobe infarcts. 3. Atrophy with moderate chronic microvascular ischemic disease.  MRA HEAD IMPRESSION:  1. Focal severe high-grade stenosis measuring approximately 4 mm in length within the mid right M1 segment. Flow is attenuated within the distal right MCA artery branches. 2. Additional multi focal atherosclerotic irregularity throughout the intracranial circulation. 3. 4 mm somewhat linear focal outpouching arising from the lateral aspect of the cavernous left ICA. This may reflect artifact versus a small aneurysm. A follow-up study in 3 months  to document stability may be helpful for further evaluation.   Electronically Signed   By: Rise Mu M.D.   On: 11/15/2014 22:50   Mr Brain Wo Contrast  11/15/2014   EXAM: MRI HEAD WITHOUT CONTRAST  MRA HEAD WITHOUT CONTRAST  TECHNIQUE: Multiplanar, multiecho pulse sequences of the brain and surrounding structures were obtained without intravenous contrast. Angiographic images of the head were obtained using MRA technique without contrast.  COMPARISON:  Prior CT performed earlier on the same day.  FINDINGS: MRI HEAD FINDINGS  Study is degraded by motion artifact.  Diffuse prominence of the CSF containing spaces is compatible with  generalized cerebral atrophy. Patchy and confluent T2/FLAIR hyperintensity within the periventricular and deep white matter both cerebral hemispheres is most consistent with chronic small vessel ischemic disease. Encephalomalacia within the anterior right frontal lobe compatible with remote infarct. There is a smaller remote right occipital lobe infarct as well.  Multiple foci of restricted diffusion are seen involving predominantly the deep white matter of the right frontal lobe and the right centrum semi ovale (series 3, image 34). The largest focus of restricted diffusion measures 11 mm and is seen adjacent to the frontal horn of the right lateral ventricle. There is a small focus of restricted diffusion within the right basal ganglia as well. There is involvement of the periatrial white matter on the right. Findings are consistent with acute ischemic infarcts. No associated hemorrhage or mass effect. These are position in somewhat of a linear fashion within the deep white matter of the right cerebral hemisphere, and may be watershed in nature.  No mass lesion or midline shift. No hydrocephalus. No extra-axial fluid collection.  Craniocervical junction grossly normal. Node definite abnormality seen within the pituitary gland. Orbits within normal limits.  Visualized bone marrow signal intensity is normal.  Paranasal sinuses and mastoid air cells are clear.  MRA HEAD FINDINGS  ANTERIOR CIRCULATION:  Visualized portions of the distal cervical segments of the internal carotid arteries are widely patent bilaterally with antegrade flow. Mild to moderate multi focal atherosclerotic irregularity seen within the petrous and cavernous ICAs bilaterally without hemodynamically significant stenosis. A 4 mm focal outpouching arising from the lateral aspect of the cavernous left ICA on time-of-flight sequence may reflect a small aneurysm versus artifact (series 12, image 61). This is directed laterally and slightly  posteriorly.  A1 segments, anterior communicating artery, and anterior cerebral arteries are opacified bilaterally.  There is a severe high-grade stenosis measuring 4 mm in length within the mid aspect of the right M1 segment (series 12, image 7). Right M1 segment is opacified distally, although flow within the distal right MCA branches is attenuated as compared to the left.  Mild multi focal atherosclerotic irregularity seen within the left MCA artery and its branches without hemodynamically significant stenosis or proximal branch occlusion.  POSTERIOR CIRCULATION:  Distal vertebral arteries are patent bilaterally. Posterior inferior cerebral arteries not well evaluated on this exam. Vertebrobasilar junction normal. Multi focal atherosclerotic irregularity seen within the basilar artery without hemodynamically significant stenosis. Superior cerebellar arteries patent bilaterally. Multi focal atherosclerotic irregularity seen within the PCAs bilaterally, left slightly worse than right. There is associated probable several areas of moderate stenosis within the left P2 segment.  IMPRESSION: MRI HEAD IMPRESSION:  1. Multi focal ischemic infarcts involving the right MCA territory distribution. These infarcts are largely located in a watershed distribution involving the deep white matter of the right centrum semi ovale. No associated hemorrhage or significant mass effect. 2. Remote  right frontal and right occipital lobe infarcts. 3. Atrophy with moderate chronic microvascular ischemic disease.  MRA HEAD IMPRESSION:  1. Focal severe high-grade stenosis measuring approximately 4 mm in length within the mid right M1 segment. Flow is attenuated within the distal right MCA artery branches. 2. Additional multi focal atherosclerotic irregularity throughout the intracranial circulation. 3. 4 mm somewhat linear focal outpouching arising from the lateral aspect of the cavernous left ICA. This may reflect artifact versus a small  aneurysm. A follow-up study in 3 months to document stability may be helpful for further evaluation.   Electronically Signed   By: Rise Mu M.D.   On: 11/15/2014 22:50   Carotid Doppler  There is 1-39% bilateral ICA stenosis. Vertebral artery flow is antegrade.    2D Echocardiogram  Normal LV function with elevated LV filling pressure; severe LAE; mild RAE; mildly reduced RV function; mild AS; moderate MS (by gradient) with moderate MR; mild TR with severely elevated pulmonary pressures.   PHYSICAL EXAM Obese elderly lady not in distress.Awake alert. Afebrile. Head is nontraumatic. Neck is supple without bruit. Hearing is normal. Cardiac exam no murmur or gallop. Lungs are clear to auscultation. Distal pulses are well felt. Neurological Exam : Awake alert oriented x 3 normal speech and language. Mild left lower face asymmetry. Tongue midline. No drift. Mild left hemiparesis 4/5 strength Mild diminished fine finger movements on left. Orbits right over left upper extremity. Mild left grip weak.. Normal sensation . Normal coordination. ASSESSMENT/PLAN Ms. AGAPE HARDIMAN is a 80 y.o. female with history of HTN, chronic congestive heart failure, CKD, OA presenting with left hemiparesis, left facial weakness and dysarthria. She did not receive IV t-PA due to clear no well and animal deficit.   Stroke:  Non-dominant right MCA territory infarcts in setting of high-grade right M1 stenosis   MRI  Right MCA territory infarct in the watershed distribution  MRA  Severe high-grade stenosis right M1. Additional multifocal atherosclerotic disease throughout. Question small left ICA aneurysm.   Carotid Doppler no ICA stenosis  2D Echo  No source of embolus  HgbA1c 5.7  Lovenox 40 mg sq daily or VTE prophylaxis  Diet Heart thin liquids  aspirin 81 mg orally every day prior to admission, now on aspirin 325 mg orally every day  Patient counseled to be compliant with her antithrombotic  medications  Ongoing aggressive stroke risk factor management  Therapy recommendations:  SNF  Disposition:  SNF  Hypertension  Stable  Hyperlipidemia  Home meds:  none  LDL 70, goal < 73  Given age of 36 and LDL almost at goal, will not recommend statin at this time.  Other Stroke Risk Factors  Advanced age  Former Cigarette smoker  Obesity, Body mass index is 32 kg/(m^2).   Family hx stroke (mother)  History of endocarditisWith documented treatment  History of rheumatic heart disease  Other Active Problems  Mitral regurgitation  Compensated diastolic heart failure  Hospital day # 1  BIBY,SHARON  [facility] See Amion for Pager information 11/16/2014 6:41 PM  I have personally examined this patient, reviewed notes, independently viewed imaging studies, participated in medical decision making and plan of care. I have made any additions or clarifications directly to the above note. Agree with note above. Patient is at significant risk for recurrent stroke and worsening due to high-grade right MCA stenosis. She will need to be on dual antiplatelet therapy with aspirin and Plavix for 3 months followed by Plavix alone. We  will continue stroke workup.  Delia Heady, MD Medical Director [facility] Pager: [phone] 11/16/2014 8:05 PM    To contact Stroke Continuity provider, please refer to [email or web address]. After hours, contact General Neurology

## 2014-11-16 NOTE — Evaluation (Signed)
Speech Language Pathology Evaluation Patient Details Name: Monica Blankenship MRN: [id number] DOB: 11-11-1929 Today's Date: 11/16/2014 Time: 1533-1550 SLP Time Calculation (min) (ACUTE ONLY): 17 min  Problem List:  Patient Active Problem List   Diagnosis Date Noted  . Left-sided weakness 11/15/2014  . Slurring of speech 11/15/2014  . Stroke 11/15/2014  . Hypertension 11/15/2014  . SBE (subacute bacterial endocarditis) 04/13/2014  . Delirium 04/10/2014  . Sepsis 04/08/2014  . Hyperkalemia 04/08/2014  . Severe sepsis(995.92) 04/08/2014  . Lactic acid acidosis 04/08/2014  . Encephalopathy acute 04/08/2014  . Complex sleep apnea syndrome 03/01/2014  . Syncope 02/08/2014  . Nocturnal hypoxemia 01/08/2014  . Bradycardia 11/17/2013  . CAP (community acquired pneumonia) 11/05/2013  . Obstructive chronic bronchitis without exacerbation 11/05/2013  . Daytime somnolence 11/05/2013  . Pulmonary HTN 10/21/2013  . HTN (hypertension)   . Chronic diastolic CHF (congestive heart failure) 10/01/2013  . Allergy   . Anemia   . Anxiety   . Insomnia   . Osteoarthritis   . Obesity   . CKD (chronic kidney disease)   . Mitral stenosis   . Aortic stenosis   . Aortic regurgitation    Past Medical History:  Past Medical History  Diagnosis Date  . Allergy   . Anxiety   . Insomnia   . Osteoarthritis   . Anemia   . Obesity   . Diastolic dysfunction   . CKD (chronic kidney disease)   . Mitral stenosis     with moderate MR on echo 4/14  . Aortic stenosis     mild by echo 4/14  . Aortic regurgitation     mild to moderate by echo 4/14  . HTN (hypertension)    Past Surgical History:  Past Surgical History  Procedure Laterality Date  . Eye surgery    . Abdominal hysterectomy    . Appendectomy    . Cataract extraction, bilateral    . Tee without cardioversion N/A 04/13/2014    Procedure: TRANSESOPHAGEAL ECHOCARDIOGRAM (TEE);  Surgeon: Vesta Mixer, MD;  Location: [facility] ENDOSCOPY;   Service: Cardiovascular;  Laterality: N/A;   HPI:  80 yo female admitted with L facial droop, L side weakness, confusion x2 days, and gait deficits. CT (-) MRI (+) R MCA workup underway NIH =4 PMH: subacute bacterial endocarditis 05/2014, CHF, RA, CKD, HTN, anxiety, obese, eye surg, cataract extraction, tee with cardioversion    Assessment / Plan / Recommendation Clinical Impression  Pt has moderate deficits in the areas of sustained attention, storage/retrieval of new information, basic problem solving, and intellectual/emergent awareness. Pt has slow processing, which appears to further impact auditory comprehension and ability to follow multistep commands. Accuracy with tasks increased with Min-Mod A from SLP for slower rate and repetitions, however overall pt remains disoriented to situation and with limited awareness of acute deficits. Speech is impacted by a mild dysarthria due primarily to imprecise articulation. Pt will benefit from continued SLP services.    SLP Assessment  Patient needs continued Speech Lanaguage Pathology Services    Follow Up Recommendations  Skilled Nursing facility;24 hour supervision/assistance    Frequency and Duration min 2x/week  2 weeks   Pertinent Vitals/Pain Pain Assessment: No/denies pain   SLP Goals  Patient/Family Stated Goal: none stated Potential to Achieve Goals (ACUTE ONLY): Good  SLP Evaluation Prior Functioning  Cognitive/Linguistic Baseline: Information not available Type of Home: House  Lives With: Spouse;Son;Daughter   Cognition  Overall Cognitive Status: No family/caregiver present to determine baseline cognitive functioning Arousal/Alertness:  Lethargic Orientation Level: Oriented to person;Oriented to place;Oriented to time;Disoriented to situation Attention: Sustained Sustained Attention: Impaired Sustained Attention Impairment: Verbal basic;Functional basic Memory: Impaired Memory Impairment: Retrieval deficit;Storage  deficit;Decreased recall of new information Awareness: Impaired Awareness Impairment: Intellectual impairment;Emergent impairment;Anticipatory impairment Problem Solving: Impaired Problem Solving Impairment: Functional basic Safety/Judgment: Impaired    Comprehension  Auditory Comprehension Overall Auditory Comprehension: Impaired Yes/No Questions: Impaired Complex Questions: 75-100% accurate Paragraph Comprehension (via yes/no questions): 26-50% accurate Commands: Impaired One Step Basic Commands: 75-100% accurate Two Step Basic Commands: 50-74% accurate Multistep Basic Commands: 25-49% accurate Conversation: Simple Interfering Components: Attention;Processing speed;Working Theatre manager: Not tested Reading Comprehension Reading Status: Impaired Word level: Impaired Interfering Components: Visual scanning    Expression Expression Primary Mode of Expression: Verbal Verbal Expression Overall Verbal Expression: Appears within functional limits for tasks assessed Written Expression Written Expression: Not tested   Oral / Motor Motor Speech Overall Motor Speech: Impaired Respiration: Within functional limits Phonation: Normal Resonance: Within functional limits Articulation: Impaired Level of Impairment: Conversation Intelligibility: Intelligibility reduced Conversation: 75-100% accurate Motor Planning: Witnin functional limits Motor Speech Errors: Not applicable   GO      Maxcine Ham, M.A. CCC-SLP [phone]  Maxcine Ham 11/16/2014, 3:59 PM

## 2014-11-16 NOTE — Progress Notes (Signed)
OT Cancellation Note  Patient Details Name: Monica Blankenship MRN: [id number] DOB: October 16, 1929   Cancelled Treatment:    Reason Eval/Treat Not Completed: Patient at procedure or test/ unavailable  Earlie Raveling OTR/L [phone] 11/16/2014, 11:28 AM

## 2014-11-16 NOTE — Evaluation (Signed)
Physical Therapy Evaluation Patient Details Name: Monica Blankenship MRN: [id number] DOB: [birth year] Today's Date: 11/16/2014   History of Present Illness  80 yo female admitted with L facial droop, L side weakness, confusion x2 days, and gait deficits. 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 Pt with generalized weakness LUE>LLE, balance deficits and pain in left knee impacting safe mobility and function. Pt tolerated ambulation with min A for balance/safety. Pt lives at home with 52 yo spouse and disabled son so will most likely need St SNF at discharge. Pt would benefit from skilled PT to improve transfers, gait, balance and mobility so pt can maximize independence and ease burden of care prior to return home.     Follow Up Recommendations SNF;Supervision/Assistance - 24 hour    Equipment Recommendations  Rolling walker with 5" wheels    Recommendations for Other Services       Precautions / Restrictions Precautions Precautions: Fall Restrictions Weight Bearing Restrictions: No      Mobility  Bed Mobility Overal bed mobility: Needs Assistance Bed Mobility: Rolling;Sidelying to Sit;Sit to Supine Rolling: Min assist Sidelying to sit: Mod assist;HOB elevated   Sit to supine: Min guard   General bed mobility comments: HOB elevated, use of rails. VC's to reach for rail with RUE. Mod A to scoot bottom to EOB and for trunk elevation.   Transfers Overall transfer level: Needs assistance Equipment used: None;Rolling walker (2 wheeled) Transfers: Sit to/from Stand Sit to Stand: Min assist         General transfer comment: Min A to rise from EOB, manual cues for hip/knee extension and upright posture. Stood from EOB,  from chair x1. Increased time and effort.  Ambulation/Gait Ambulation/Gait assistance: Min assist Ambulation Distance (Feet): 100 Feet Assistive device: Rolling walker (2 wheeled) Gait Pattern/deviations: Step-through pattern;Decreased stride length;Trunk flexed;Decreased stance time - left Gait velocity: decreased Gait velocity interpretation: Below normal speed for age/gender General Gait Details: Pt with slow, unsteady gait with increased pain through left knee during stance. No knee buckling noted. Assist to place LUE on walker handle.  Stairs            Wheelchair Mobility    Modified Rankin (Stroke Patients Only) Modified Rankin (Stroke Patients Only) Pre-Morbid Rankin Score: Moderate disability Modified Rankin: Moderately severe disability     Balance Overall balance assessment: Needs assistance Sitting-balance support: Feet supported;No upper extremity supported Sitting balance-Leahy Scale: Fair Sitting balance - Comments: Able to perform AROM BUEs without LOB in any direction and assessment of bLEs.   Standing balance support: During functional activity;Bilateral upper extremity supported Standing balance-Leahy Scale: Poor Standing balance comment: Requires BUE support on RW for balance/safety.                             Pertinent Vitals/Pain Pain Assessment: 0-10 Pain Score:  (Not rated on pain scale.) Pain Location: left knee pain. Pain Descriptors / Indicators: Sore;Aching Pain Intervention(s): Monitored during session;Limited activity within patient's tolerance;Repositioned    Home Living Family/patient expects to be discharged to:: Unsure Living Arrangements: Spouse/significant other;Children               Additional Comments: Pt lives with 33 yo husband and disabled  son.    Prior Function Level of Independence: Needs assistance   Gait / Transfers Assistance Needed: Pt using SPC PTA.  ADL's / Homemaking Assistance Needed: Per  daughter, pt has been mainly (I) with ADls prior to symptoms.  Does some cooking. Helps care for husband.        Hand Dominance   Dominant Hand: Left    Extremity/Trunk Assessment   Upper Extremity Assessment: LUE deficits/detail       LUE Deficits / Details: Limited AROM to ~40 degrees shoulder elevation, PROM WFL. Generalized weakness throughout, weak grip. not able to grab pills with L hand   Lower Extremity Assessment: LLE deficits/detail;Generalized weakness   LLE Deficits / Details: Grossly ~3+/5 knee extension, knee flexion.  Cervical / Trunk Assessment:  (L facial droop)  Communication   Communication: No difficulties  Cognition Arousal/Alertness: Awake/alert Behavior During Therapy: WFL for tasks assessed/performed Overall Cognitive Status: Impaired/Different from baseline Area of Impairment: Problem solving;Following commands       Following Commands: Follows multi-step commands inconsistently;Follows multi-step commands with increased time     Problem Solving: Slow processing;Requires verbal cues      General Comments General comments (skin integrity, edema, etc.): Pt with slurred speech.     Exercises        Assessment/Plan    PT Assessment Patient needs continued PT services  PT Diagnosis Difficulty walking;Generalized weakness;Acute pain   PT Problem List Decreased strength;Pain;Decreased range of motion;Decreased cognition;Decreased balance;Decreased mobility;Decreased safety awareness;Decreased activity tolerance  PT Treatment Interventions Balance training;Gait training;Cognitive remediation;Patient/family education;Functional mobility training;Therapeutic activities;Therapeutic exercise;Neuromuscular re-education   PT Goals (Current goals can be found in the Care Plan section) Acute Rehab PT Goals Patient Stated Goal: to get better PT Goal Formulation: With patient Time For Goal Achievement: 11/30/14 Potential to Achieve Goals: Good     Frequency Min 4X/week   Barriers to discharge Decreased caregiver support      Co-evaluation               End of Session Equipment Utilized During Treatment: Gait belt Activity Tolerance: Patient limited by fatigue;Patient limited by pain Patient left: in bed;with call bell/phone within reach;with family/visitor present (with transport in room) Nurse Communication: Mobility status         Time: 9562-1308 PT Time Calculation (min) (ACUTE ONLY): 31 min   Charges:   PT Evaluation $Initial PT Evaluation Tier I: 1 Procedure PT Treatments $Gait Training: 8-22 mins   PT G CodesAlvie Heidelberg A 11/16/2014, 11:16 AM  Alvie Heidelberg, PT, DPT [phone]

## 2014-11-16 NOTE — Progress Notes (Signed)
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  Tired from working with therapy States she doesn't really want to go to SNF   Objective    Interim History:   Telemetry:    Objective: Filed Vitals:   11/16/14 0645 11/16/14 0658 11/16/14 0800 11/16/14 1000  BP: 153/62 166/89 156/56 134/52  Pulse: 77 55 80 80  Temp: 98.1 F (36.7 C) 98.4 F (36.9 C)    TempSrc: Oral Oral Oral Oral  Resp:  16    Height:      Weight:      SpO2: 98% 98% 96% 99%    Intake/Output Summary (Last 24 hours) at 11/16/14 1532 Last data filed at 11/16/14 0851  Gross per 24 hour  Intake    120 ml  Output    300 ml  Net   -180 ml    Exam:  General: Eomi Ncat Cardiovascular: s1 s2 no m/r/g Respiratory: clear Abdomen: soft nt/nd  Skin no le edema Neuro Weaker L side-limited exam  Data Reviewed: Basic Metabolic Panel:  Recent Labs Lab 11/14/14 2030 11/15/14 1851 11/15/14 1905 11/16/14 0620  NA 141 140 141 143  K 4.6 4.1 4.0 3.9  CL 102 102 104 106  CO2 25 25  --  25  GLUCOSE 148* 141* 144* 93  BUN 13 13 14 11   CREATININE 0.95 0.84 1.00 0.80  CALCIUM 10.2 9.7  --  9.7   Liver Function Tests:  Recent Labs Lab 11/14/14 2030 11/15/14 1851 11/16/14 0620  AST 29 15 15   ALT 17 15 14   ALKPHOS 73 73 70  BILITOT 0.6 0.5 0.6  PROT 8.5* 7.8 7.4  ALBUMIN 4.1 3.7 3.6   No results for input(s): LIPASE, AMYLASE in the last 168 hours. No  results for input(s): AMMONIA in the last 168 hours. CBC:  Recent Labs Lab 11/14/14 2030 11/15/14 1851 11/15/14 1905 11/16/14 0620  WBC 7.4 8.3  --  6.8  NEUTROABS  --  5.9  --  4.1  HGB 12.1 11.8* 13.3 11.7*  HCT 36.9 36.1 39.0 36.1  MCV 98.4 97.0  --  97.3  PLT 199 184  --  189   Cardiac Enzymes: No results for input(s): CKTOTAL, CKMB, CKMBINDEX, TROPONINI in the last 168 hours. BNP: Invalid input(s): POCBNP CBG: No results for input(s): GLUCAP in the last 168 hours.  No results found for this or any previous visit (from the past 240 hour(s)).   Studies:              All Imaging reviewed and is as per above notation   Scheduled Meds: . aspirin  300 mg Rectal Daily   Or  . aspirin  325 mg Oral Daily  . doxazosin  8 mg Oral QHS  . enoxaparin (LOVENOX) injection  40 mg Subcutaneous QHS  . ferrous sulfate  325 mg Oral Q breakfast  . folic acid  1 mg Oral Daily  . hydrALAZINE  75 mg Oral  TID  . predniSONE  5 mg Oral Q breakfast  . verapamil  120 mg Oral Daily   Continuous Infusions:    Assessment/Plan: 1. CVA-Per Neurology.  Therapy recommending SNF-Will discuss with family ion am 11:30 as patient refusing placement. Doppler neg/Echo pending.  Continue ASA 325 daily 2. Prior endocarditis with documented Rx-await echo 3. prior h/o Rheumatism/Rheumatic heart disease?-Per cardiology as OP 4. MR 2/2 to #2-await echo 5. Compensated Diastolic CHf Ef 60-6% + Pulm Htn 55 mm hg  Code Status: Full Family Communication: discussed with daughter  Disposition Plan: inpatient-await echo and maybe d/c to SNF am   Pleas Koch, MD  [facility] Pager [phone] 11/16/2014, 3:32 PM    LOS: 1 day

## 2014-11-16 NOTE — Telephone Encounter (Signed)
Patient's daughter, Lamona Eimer @ [phone] would like to discuss plan of care for mother.  Patient is on 4 north room 5.  Please call and advise.

## 2014-11-16 NOTE — Progress Notes (Signed)
VASCULAR LAB PRELIMINARY  PRELIMINARY  PRELIMINARY  PRELIMINARY  Carotid duplex  completed.    Preliminary report:  Bilateral:  1-39% ICA stenosis.  Vertebral artery flow is antegrade.      Chevelle Coulson, RVT 11/16/2014, 12:01 PM

## 2014-11-16 NOTE — Telephone Encounter (Signed)
i called her and updated her aboutmother`s condition and stated we are in the process of doing more tests inorder to determine cause of her stroke

## 2014-11-16 NOTE — Clinical Social Work Note (Signed)
Clinical Social Worker attempted to meet with patient twice today. Patient has been away in a medical procedure. CSW will attempt to meet with patient tomorrow in reference post-acute placement for SNF.   CSW to follow.   Derenda Fennel, MSW, LCSWA [phone] 11/16/2014 2:40 PM

## 2014-11-16 NOTE — Evaluation (Addendum)
Occupational Therapy Evaluation Patient Details Name: Monica Blankenship MRN: [id number] DOB: [birth year] Today's Date: 11/16/2014    History of Present Illness 80 yo female admitted with L facial droop, L side weakness, confusion x2 days, and gait deficits. 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 Pt independent with ADLs, PTA. Pt with apparent left neglect/visual field cut in left visual field as well as weakness in LUE. Feel pt will benefit from acute OT to increase independence with BADLs and address deficits listed below.     Follow Up Recommendations  SNF;Supervision/Assistance - 24 hour    Equipment Recommendations  Other (comment) (defer to next venue)    Recommendations for Other Services       Precautions / Restrictions Precautions Precautions: Fall Restrictions Weight Bearing Restrictions: No      Mobility Bed Mobility               General bed mobility comments: not assessed  Transfers Overall transfer level: Needs assistance   Transfers: Sit to/from Stand Sit to Stand: Min assist         General transfer comment: assist to position LUE on walker.     Balance                                            ADL Overall ADL's : Needs assistance/impaired Eating/Feeding: Moderate assistance;Sitting;With adaptive utensils   Grooming: Wash/dry face;Oral care;Applying deodorant;Moderate assistance;Standing   Upper Body Bathing: Moderate assistance;Sitting   Lower Body Bathing: Maximal assistance;Sit to/from stand   Upper Body Dressing : Moderate assistance;Sitting   Lower Body Dressing: Maximal assistance;Sit to/from stand   Toilet Transfer: Ambulation;Minimal assistance;RW (chair)   Toileting- Clothing Manipulation and Hygiene: Moderate  assistance;Sit to/from stand       Functional mobility during ADLs: Minimal assistance;Moderate assistance;Rolling walker General ADL Comments: Pt performed grooming tasks at sink-great difficulty locating items on left (OT manually turned pt's head at times to left). Explained to her to move her head to left. Encouraged pt to continue using LUE for activities. Pt trying to use left hand during session-difficult. Applied red built up foam to toothbrush but need to have left elbow supported to help perform oral care better unless pt uses right hand for this.  Pt unaware why she was in hospital.     Vision Eye Alignment: Impaired (comment)   Pt wears glasses and reports no change from baseline.  Visual Tracking: Pt had great difficulty with tracking. Would not track to left side and had trouble in right field as well.  Visual Fields: Pt not seeing in left visual fields.                   Perception     Praxis      Pertinent Vitals/Pain Pain Assessment: Faces Faces Pain Scale: Hurts little more Pain Location: left knee Pain Intervention(s): Repositioned;Monitored during session     Hand Dominance Left   Extremity/Trunk Assessment Upper Extremity Assessment Upper Extremity Assessment: LUE deficits/detail LUE Deficits / Details: 3-/5 shoulder flexion; weak grasp LUE Coordination: decreased fine motor   Lower Extremity Assessment Lower Extremity Assessment: Defer to PT evaluation       Communication  Communication Communication: Expressive difficulties   Cognition Arousal/Alertness: Awake/alert Behavior During Therapy: Flat affect Overall Cognitive Status: No family/caregiver present to determine baseline cognitive functioning Area of Impairment: Following commands;Attention;Problem solving;Memory;Orientation Orientation Level: Disoriented to;Situation Current Attention Level: Sustained Memory: Decreased short-term memory Following Commands: Follows one step  commands with increased time     Problem Solving: Slow processing;Requires verbal cues     General Comments       Exercises       Shoulder Instructions      Home Living Family/patient expects to be discharged to:: Skilled nursing facility Living Arrangements: Spouse/significant other;Children   Type of Home: House                           Additional Comments: Pt lives with 57 yo husband and disabled son.  Lives With: Spouse;Son;Daughter    Prior Functioning/Environment Level of Independence: Needs assistance  Gait / Transfers Assistance Needed: Pt using SPC PTA. ADL's / Homemaking Assistance Needed: Per daughter, pt has been mainly (I) with ADls prior to symptoms.  Does some cooking. Helps care for husband.        OT Diagnosis: Acute pain;Other (comment) (hemiparesis dominant side)   OT Problem List: Decreased strength;Decreased range of motion;Impaired balance (sitting and/or standing);Decreased cognition;Decreased coordination;Impaired vision/perception;Decreased knowledge of use of DME or AE;Decreased knowledge of precautions;Pain;Decreased activity tolerance;Impaired UE functional use   OT Treatment/Interventions: Self-care/ADL training;DME and/or AE instruction;Therapeutic exercise;Therapeutic activities;Visual/perceptual remediation/compensation;Cognitive remediation/compensation;Patient/family education;Balance training;Neuromuscular education    OT Goals(Current goals can be found in the care plan section) Acute Rehab OT Goals Patient Stated Goal: not stated OT Goal Formulation: With patient Time For Goal Achievement: 11/23/14 Potential to Achieve Goals: Good ADL Goals Pt Will Perform Grooming: with set-up;with supervision;standing (locating items on left side) Pt Will Perform Upper Body Dressing: with set-up;with supervision Pt Will Perform Lower Body Dressing: with set-up;with supervision;sit to/from stand Pt Will Transfer to Toilet: with  supervision;ambulating Pt Will Perform Toileting - Clothing Manipulation and hygiene: with supervision;sit to/from stand  OT Frequency: Min 2X/week   Barriers to D/C:            Co-evaluation              End of Session Equipment Utilized During Treatment: Gait belt;Rolling walker  Activity Tolerance: Patient tolerated treatment well Patient left: in chair;with call bell/phone within reach   Time: 1342-1411 OT Time Calculation (min): 29 min Charges:  OT General Charges $OT Visit: 1 Procedure OT Evaluation $Initial OT Evaluation Tier I: 1 Procedure OT Treatments $Self Care/Home Management : 8-22 mins G-CodesEarlie Raveling OTR/L [phone] 11/16/2014, 4:13 PM

## 2014-11-16 NOTE — Progress Notes (Signed)
CARE MANAGEMENT NOTE 11/16/2014  Patient:  Monica Blankenship, Monica Blankenship   Account Number:  [id number]  Date Initiated:  11/16/2014  Documentation initiated by:  Jiles Crocker  Subjective/Objective Assessment:   ADMITTED WITH STROKE     Action/Plan:   CM FOLLOWING FOR DCP   Anticipated DC Date:  11/20/2014   Anticipated DC Plan:  AWAITING FOR PT/OT EVALS FOR DISPOSITION NEEDS     DC Planning Services  CM consult         Status of service:  In process, will continue to follow Medicare Important Message given?   (If response is "NO", the following Medicare IM given date fields will be blank)  Per UR Regulation:  Reviewed for med. necessity/level of care/duration of stay  Comments:  12/15/2015Abelino Derrick RN,BSN,MHA [phone]

## 2014-11-16 NOTE — Progress Notes (Signed)
Echo Lab  2D Echocardiogram completed.  Nicasio Barlowe L Eara Burruel, RDCS 11/16/2014 11:40 AM

## 2014-11-17 MED ORDER — CLOPIDOGREL BISULFATE 75 MG PO TABS
75.0000 mg | ORAL_TABLET | Freq: Every day | ORAL | Status: DC
Start: 2014-11-17 — End: 2014-11-17
  Administered 2014-11-17: 75 mg via ORAL
  Filled 2014-11-17: qty 1

## 2014-11-17 MED ORDER — ASPIRIN 81 MG PO CHEW: 324.0000 mg | CHEWABLE_TABLET | Freq: Every day | ORAL | Status: DC

## 2014-11-17 MED ORDER — TRAMADOL HCL 50 MG PO TABS: 50.0000 mg | ORAL_TABLET | Freq: Four times a day (QID) | ORAL | Status: DC | PRN

## 2014-11-17 MED ORDER — ATORVASTATIN CALCIUM 80 MG PO TABS: 80.0000 mg | ORAL_TABLET | Freq: Every day | ORAL | Status: DC

## 2014-11-17 MED ORDER — TRAMADOL HCL 50 MG PO TABS
50.0000 mg | ORAL_TABLET | Freq: Four times a day (QID) | ORAL | Status: DC | PRN
  Administered 2014-11-17: 50 mg via ORAL
  Filled 2014-11-17: qty 1

## 2014-11-17 MED ORDER — CLOPIDOGREL BISULFATE 75 MG PO TABS: 75.0000 mg | ORAL_TABLET | Freq: Every day | ORAL | Status: DC

## 2014-11-17 MED ORDER — ASPIRIN EC 81 MG PO TBEC: 81.0000 mg | DELAYED_RELEASE_TABLET | Freq: Every day | ORAL | Status: DC

## 2014-11-17 NOTE — Progress Notes (Signed)
Pt transported out of unit per stretcher by ambulance personnel. No distress noted.   Andrew Au I 11/17/2014 5:09 PM

## 2014-11-17 NOTE — Progress Notes (Signed)
STROKE TEAM PROGRESS NOTE   HISTORY Monica Blankenship is an 80 y.o. female, left handed, with a past medical history significant for HTN, chronic congestive heart failure, CKD, OA, brought in via EMS as a code stroke due to acute onset of left hemiparesis, left facial weakness and dysarthria. She lives at home and review of her medical records indicate that she was evaluated last night at [facility] due to altered mental status and c/o feeling generally weak, fatigue for the past day, but there is not documentation of a focal neurological finding while being at [facility]. Patient was discharged home from the ED and she said that around 3 pm today 12/14/2015she was eating a cheeseburger and noticed her tongue felt "thick". Reportedly Pt family noticed left sided facial droop aroud 1715 today 11/15/2014. Upon arrival to the ED noted to have mild left hemiparesis, left face weakness, and dysarthria for a NIHSS 4. CT brain showed no acute intracranial abnormality. In the ED she was alert and answering questions appropriately. Denies HA, vertigo, double vision, difficulty swallowing, or visual disturbances. They were unable to pinpoint last known well. TPA was not administered secondary to unclear last known well, and mild deficits. She was admitted for further evaluation and treatment.   SUBJECTIVE (INTERVAL HISTORY) Family at bedside (daughter and husband). The patient indicates that she feels well.   OBJECTIVE Temp:  [97.9 F (36.6 C)-98.7 F (37.1 C)] 97.9 F (36.6 C) (12/16 0948) Pulse Rate:  [71-114] 74 (12/16 0948) Cardiac Rhythm:  [-] Normal sinus rhythm (12/16 0745) Resp:  [18] 18 (12/16 0948) BP: (130-174)/(39-68) 157/61 mmHg (12/16 0948) SpO2:  [96 %-100 %] 97 % (12/16 0948)  No results for input(s): GLUCAP in the last 168 hours.  Recent Labs Lab 11/14/14 2030 11/15/14 1851 11/15/14 1905 11/16/14 0620  NA 141 140 141 143  K 4.6 4.1 4.0 3.9  CL 102 102 104 106  CO2 25 25  --  25  GLUCOSE  148* 141* 144* 93  BUN 13 13 14 11   CREATININE 0.95 0.84 1.00 0.80  CALCIUM 10.2 9.7  --  9.7    Recent Labs Lab 11/14/14 2030 11/15/14 1851 11/16/14 0620  AST 29 15 15   ALT 17 15 14   ALKPHOS 73 73 70  BILITOT 0.6 0.5 0.6  PROT 8.5* 7.8 7.4  ALBUMIN 4.1 3.7 3.6    Recent Labs Lab 11/14/14 2030 11/15/14 1851 11/15/14 1905 11/16/14 0620  WBC 7.4 8.3  --  6.8  NEUTROABS  --  5.9  --  4.1  HGB 12.1 11.8* 13.3 11.7*  HCT 36.9 36.1 39.0 36.1  MCV 98.4 97.0  --  97.3  PLT 199 184  --  189   No results for input(s): CKTOTAL, CKMB, CKMBINDEX, TROPONINI in the last 168 hours.  Recent Labs  11/15/14 1851  LABPROT 14.6  INR 1.12    Recent Labs  11/14/14 2136 11/15/14 1948  COLORURINE YELLOW YELLOW  LABSPEC 1.006 1.008  PHURINE 7.0 7.0  GLUCOSEU NEGATIVE NEGATIVE  HGBUR NEGATIVE NEGATIVE  BILIRUBINUR NEGATIVE NEGATIVE  KETONESUR NEGATIVE NEGATIVE  PROTEINUR NEGATIVE NEGATIVE  UROBILINOGEN 0.2 0.2  NITRITE NEGATIVE NEGATIVE  LEUKOCYTESUR NEGATIVE NEGATIVE       Component Value Date/Time   CHOL 129 11/16/2014 0620   TRIG 69 11/16/2014 0620   HDL 40 11/16/2014 0620   CHOLHDL 3.2 11/16/2014 0620   VLDL 14 11/16/2014 0620   LDLCALC 75 11/16/2014 0620   Lab Results  Component Value Date  HGBA1C 5.7* 11/15/2014      Component Value Date/Time   LABOPIA NONE DETECTED 11/15/2014 1948   COCAINSCRNUR NONE DETECTED 11/15/2014 1948   LABBENZ NONE DETECTED 11/15/2014 1948   AMPHETMU NONE DETECTED 11/15/2014 1948   THCU NONE DETECTED 11/15/2014 1948   LABBARB NONE DETECTED 11/15/2014 1948     Recent Labs Lab 11/15/14 1851  ETH <11    Dg Chest 2 View  11/16/2014   CLINICAL DATA:  Hypertension.  Stroke symptoms.  EXAM: CHEST  2 VIEW  COMPARISON:  Chest radiograph 09/16/2014.  FINDINGS: Stable enlarged cardiac and mediastinal contours. Mild pulmonary venous hypertension. Minimal left basilar heterogeneous pulmonary opacities. Motion artifact limits evaluation  on the lateral view. Mid thoracic spine degenerative changes.  IMPRESSION: Minimal heterogeneous opacities left lung base may represent atelectasis. Infection not excluded.  Cardiomegaly with pulmonary venous hypertension.   Electronically Signed   By: Annia Belt M.D.   On: 11/16/2014 15:16   Ct Head Wo Contrast  11/15/2014   CLINICAL DATA:  Left-sided weakness, stroke protocol  EXAM: CT HEAD WITHOUT CONTRAST  TECHNIQUE: Contiguous axial images were obtained from the base of the skull through the vertex without intravenous contrast.  COMPARISON:  11/14/2014  FINDINGS: There is no evidence of mass effect, midline shift, or extra-axial fluid collections. There is no evidence of a space-occupying lesion or intracranial hemorrhage. There is no evidence of a cortical-based area of acute infarction. Old right frontal lobe infarct with encephalomalacia. There is an old right cerebellar infarct. There is generalized cerebral atrophy. There is periventricular white matter low attenuation likely secondary to microangiopathy.  The ventricles and sulci are appropriate for the patient's age. The basal cisterns are patent.  Visualized portions of the orbits are unremarkable. The visualized portions of the paranasal sinuses and mastoid air cells are unremarkable. Cerebrovascular atherosclerotic calcifications are noted.  The osseous structures are unremarkable.  IMPRESSION: 1. No acute intracranial pathology. 2. Old right frontal lobe infarct. 3. Chronic microvascular disease and cerebral atrophy. These results were called by telephone at the time of interpretation on 11/15/2014 at 7:13 pm to Dr. Thad Ranger, who verbally acknowledged these results.   Electronically Signed   By: Elige Ko   On: 11/15/2014 19:19   Mr Maxine Glenn Head Wo Contrast  11/15/2014   EXAM: MRI HEAD WITHOUT CONTRAST  MRA HEAD WITHOUT CONTRAST  TECHNIQUE: Multiplanar, multiecho pulse sequences of the brain and surrounding structures were obtained without  intravenous contrast. Angiographic images of the head were obtained using MRA technique without contrast.  COMPARISON:  Prior CT performed earlier on the same day.  FINDINGS: MRI HEAD FINDINGS  Study is degraded by motion artifact.  Diffuse prominence of the CSF containing spaces is compatible with generalized cerebral atrophy. Patchy and confluent T2/FLAIR hyperintensity within the periventricular and deep white matter both cerebral hemispheres is most consistent with chronic small vessel ischemic disease. Encephalomalacia within the anterior right frontal lobe compatible with remote infarct. There is a smaller remote right occipital lobe infarct as well.  Multiple foci of restricted diffusion are seen involving predominantly the deep white matter of the right frontal lobe and the right centrum semi ovale (series 3, image 34). The largest focus of restricted diffusion measures 11 mm and is seen adjacent to the frontal horn of the right lateral ventricle. There is a small focus of restricted diffusion within the right basal ganglia as well. There is involvement of the periatrial white matter on the right. Findings are consistent with acute ischemic  infarcts. No associated hemorrhage or mass effect. These are position in somewhat of a linear fashion within the deep white matter of the right cerebral hemisphere, and may be watershed in nature.  No mass lesion or midline shift. No hydrocephalus. No extra-axial fluid collection.  Craniocervical junction grossly normal. Node definite abnormality seen within the pituitary gland. Orbits within normal limits.  Visualized bone marrow signal intensity is normal.  Paranasal sinuses and mastoid air cells are clear.  MRA HEAD FINDINGS  ANTERIOR CIRCULATION:  Visualized portions of the distal cervical segments of the internal carotid arteries are widely patent bilaterally with antegrade flow. Mild to moderate multi focal atherosclerotic irregularity seen within the petrous and  cavernous ICAs bilaterally without hemodynamically significant stenosis. A 4 mm focal outpouching arising from the lateral aspect of the cavernous left ICA on time-of-flight sequence may reflect a small aneurysm versus artifact (series 12, image 61). This is directed laterally and slightly posteriorly.  A1 segments, anterior communicating artery, and anterior cerebral arteries are opacified bilaterally.  There is a severe high-grade stenosis measuring 4 mm in length within the mid aspect of the right M1 segment (series 12, image 7). Right M1 segment is opacified distally, although flow within the distal right MCA branches is attenuated as compared to the left.  Mild multi focal atherosclerotic irregularity seen within the left MCA artery and its branches without hemodynamically significant stenosis or proximal branch occlusion.  POSTERIOR CIRCULATION:  Distal vertebral arteries are patent bilaterally. Posterior inferior cerebral arteries not well evaluated on this exam. Vertebrobasilar junction normal. Multi focal atherosclerotic irregularity seen within the basilar artery without hemodynamically significant stenosis. Superior cerebellar arteries patent bilaterally. Multi focal atherosclerotic irregularity seen within the PCAs bilaterally, left slightly worse than right. There is associated probable several areas of moderate stenosis within the left P2 segment.  IMPRESSION: MRI HEAD IMPRESSION:  1. Multi focal ischemic infarcts involving the right MCA territory distribution. These infarcts are largely located in a watershed distribution involving the deep white matter of the right centrum semi ovale. No associated hemorrhage or significant mass effect. 2. Remote right frontal and right occipital lobe infarcts. 3. Atrophy with moderate chronic microvascular ischemic disease.  MRA HEAD IMPRESSION:  1. Focal severe high-grade stenosis measuring approximately 4 mm in length within the mid right M1 segment. Flow is  attenuated within the distal right MCA artery branches. 2. Additional multi focal atherosclerotic irregularity throughout the intracranial circulation. 3. 4 mm somewhat linear focal outpouching arising from the lateral aspect of the cavernous left ICA. This may reflect artifact versus a small aneurysm. A follow-up study in 3 months to document stability may be helpful for further evaluation.   Electronically Signed   By: Rise Mu M.D.   On: 11/15/2014 22:50   Mr Brain Wo Contrast  11/15/2014   EXAM: MRI HEAD WITHOUT CONTRAST  MRA HEAD WITHOUT CONTRAST  TECHNIQUE: Multiplanar, multiecho pulse sequences of the brain and surrounding structures were obtained without intravenous contrast. Angiographic images of the head were obtained using MRA technique without contrast.  COMPARISON:  Prior CT performed earlier on the same day.  FINDINGS: MRI HEAD FINDINGS  Study is degraded by motion artifact.  Diffuse prominence of the CSF containing spaces is compatible with generalized cerebral atrophy. Patchy and confluent T2/FLAIR hyperintensity within the periventricular and deep white matter both cerebral hemispheres is most consistent with chronic small vessel ischemic disease. Encephalomalacia within the anterior right frontal lobe compatible with remote infarct. There is a smaller remote right  occipital lobe infarct as well.  Multiple foci of restricted diffusion are seen involving predominantly the deep white matter of the right frontal lobe and the right centrum semi ovale (series 3, image 34). The largest focus of restricted diffusion measures 11 mm and is seen adjacent to the frontal horn of the right lateral ventricle. There is a small focus of restricted diffusion within the right basal ganglia as well. There is involvement of the periatrial white matter on the right. Findings are consistent with acute ischemic infarcts. No associated hemorrhage or mass effect. These are position in somewhat of a linear  fashion within the deep white matter of the right cerebral hemisphere, and may be watershed in nature.  No mass lesion or midline shift. No hydrocephalus. No extra-axial fluid collection.  Craniocervical junction grossly normal. Node definite abnormality seen within the pituitary gland. Orbits within normal limits.  Visualized bone marrow signal intensity is normal.  Paranasal sinuses and mastoid air cells are clear.  MRA HEAD FINDINGS  ANTERIOR CIRCULATION:  Visualized portions of the distal cervical segments of the internal carotid arteries are widely patent bilaterally with antegrade flow. Mild to moderate multi focal atherosclerotic irregularity seen within the petrous and cavernous ICAs bilaterally without hemodynamically significant stenosis. A 4 mm focal outpouching arising from the lateral aspect of the cavernous left ICA on time-of-flight sequence may reflect a small aneurysm versus artifact (series 12, image 61). This is directed laterally and slightly posteriorly.  A1 segments, anterior communicating artery, and anterior cerebral arteries are opacified bilaterally.  There is a severe high-grade stenosis measuring 4 mm in length within the mid aspect of the right M1 segment (series 12, image 7). Right M1 segment is opacified distally, although flow within the distal right MCA branches is attenuated as compared to the left.  Mild multi focal atherosclerotic irregularity seen within the left MCA artery and its branches without hemodynamically significant stenosis or proximal branch occlusion.  POSTERIOR CIRCULATION:  Distal vertebral arteries are patent bilaterally. Posterior inferior cerebral arteries not well evaluated on this exam. Vertebrobasilar junction normal. Multi focal atherosclerotic irregularity seen within the basilar artery without hemodynamically significant stenosis. Superior cerebellar arteries patent bilaterally. Multi focal atherosclerotic irregularity seen within the PCAs bilaterally,  left slightly worse than right. There is associated probable several areas of moderate stenosis within the left P2 segment.  IMPRESSION: MRI HEAD IMPRESSION:  1. Multi focal ischemic infarcts involving the right MCA territory distribution. These infarcts are largely located in a watershed distribution involving the deep white matter of the right centrum semi ovale. No associated hemorrhage or significant mass effect. 2. Remote right frontal and right occipital lobe infarcts. 3. Atrophy with moderate chronic microvascular ischemic disease.  MRA HEAD IMPRESSION:  1. Focal severe high-grade stenosis measuring approximately 4 mm in length within the mid right M1 segment. Flow is attenuated within the distal right MCA artery branches. 2. Additional multi focal atherosclerotic irregularity throughout the intracranial circulation. 3. 4 mm somewhat linear focal outpouching arising from the lateral aspect of the cavernous left ICA. This may reflect artifact versus a small aneurysm. A follow-up study in 3 months to document stability may be helpful for further evaluation.   Electronically Signed   By: Rise Mu M.D.   On: 11/15/2014 22:50   Carotid Doppler  There is 1-39% bilateral ICA stenosis. Vertebral artery flow is antegrade.    2D Echocardiogram  Normal LV function with elevated LV filling pressure; severe LAE; mild RAE; mildly reduced RV function; mild  AS; moderate MS (by gradient) with moderate MR; mild TR with severely elevated pulmonary pressures.     Physical Exam  General: The patient is alert and cooperative at the time of the examination.  Skin: No significant peripheral edema is noted.   Neurologic Exam  Mental status: The patient is alert and cooperative, able to follow commands.  Cranial nerves: Facial symmetry is not present. There is depression of the left nasolabial fold. Speech is normal, no aphasia or dysarthria is noted. Extraocular movements are associated with a  right gaze preference, the patient is able to get the eyes to midline, not all the way to the left.Jill Alexanders fields are notable for a left homonymous visual field deficit.  Motor: The patient has good strength in the right extremities. On the left side, the patient has 4/5 strength of the left arm, 4+/5 strength on the left leg.  Sensory examination: Soft touch sensation is symmetric on the face, arms, and legs.  Coordination: The patient has good finger-nose-finger and heel-to-shin on the right, with mild dysmetria with the left upper extremity.  Gait and station: The gait was not tested.  Reflexes: Deep tendon reflexes are symmetric.     ASSESSMENT/PLAN Ms. Monica Blankenship is a 80 y.o. female with history of HTN, chronic congestive heart failure, CKD, OA presenting with left hemiparesis, left facial weakness and dysarthria. She did not receive IV t-PA due to clear no well and animal deficit.   Stroke:  Non-dominant right MCA territory infarcts in setting of high-grade right M1 stenosis   MRI  Right MCA territory infarct in the watershed distribution  MRA  Severe high-grade stenosis right M1. Additional multifocal atherosclerotic disease throughout. Question small left ICA aneurysm.   Carotid Doppler no ICA stenosis  2D Echo  No source of embolus  HgbA1c 5.7  Lovenox 40 mg sq daily or VTE prophylaxis  Diet Heart thin liquids  aspirin 81 mg orally every day prior to admission, now on aspirin 325 mg orally every day. Patient is at significant risk for recurrent stroke and worsening due to high-grade right MCA stenosis. She will need to be on dual antiplatelet therapy with aspirin and Plavix for 3 months followed by Plavix alone. Order written  Patient counseled to be compliant with her antithrombotic medications  Ongoing aggressive stroke risk factor management  Therapy recommendations:  SNF  Disposition:  SNF  Hypertension  Stable  Hyperlipidemia  Home meds:   none  LDL 70, goal < 48  Given age of 78 and LDL almost at goal, will not recommend statin at this time.  Other Stroke Risk Factors  Advanced age  Former Cigarette smoker  Obesity, Body mass index is 32 kg/(m^2).   Family hx stroke (mother)  History of endocarditisWith documented treatment  History of rheumatic heart disease  Other Active Problems  Mitral regurgitation  Compensated diastolic heart failure  NO FURTHER STROKE WORKUP INDICATED  Patient has a 10-15% risk of having another stroke over the next year, the highest risk is within 2 weeks of the most recent stroke/TIA (risk of having a stroke following a stroke or TIA is the same).  Ongoing risk factor control by Primary Care Physician  Stroke Service will sign off. Please call should any needs arise.  Follow up with Dr. Delia Heady at [facility] in 2 month(s), order placed.  Hospital day # 2  Rhoderick Moody [facility] See Amion for Pager information 11/17/2014 10:29 AM   Stephanie Acre  KEITH [phone]  To contact Stroke Continuity provider, please refer to [email or web address]. After hours, contact General Neurology

## 2014-11-17 NOTE — Progress Notes (Signed)
Physical Therapy Treatment Patient Details Name: Monica Blankenship MRN: [id number] DOB: June 24, 1929 Today's Date: 11/17/2014    History of Present Illness 80 yo female admitted with L facial droop, L side weakness, confusion x2 days, and gait deficits. CT (-) MRI (+) R MCA Multi focal ischemic infarcts involving the right MCA territory. 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 Encouraged patient throughout but patient with heavy left lean on therapist. Did not use RW as patient having difficulty maneuvering with L UE. Ice applied to L knee and RN aware of patients pain. Patient stated that she takes tylenol at home. Will continue with current POC. Continue to recommend SNF for ongoing Physical Therapy.     Follow Up Recommendations  SNF;Supervision/Assistance - 24 hour     Equipment Recommendations  Rolling walker with 5" wheels    Recommendations for Other Services       Precautions / Restrictions Precautions Precautions: Fall Restrictions Weight Bearing Restrictions: No    Mobility  Bed Mobility Overal bed mobility: Needs Assistance Bed Mobility: Supine to Sit     Supine to sit: Min assist     General bed mobility comments: Min A for trunk support and to shift L side to EOB. Cues for positioning. Use of rail with HOB elevated  Transfers Overall transfer level: Needs assistance Equipment used: Rolling walker (2 wheeled) Transfers: Sit to/from Stand Sit to Stand: Min assist         General transfer comment: assist to position LUE on walker and to power up into standing. Cues for positioning prior to sit as patient sat suddenly and very close to [place] of the chair unsafely  Ambulation/Gait Ambulation/Gait assistance: Mod  assist Ambulation Distance (Feet): 100 Feet Assistive device: None Gait Pattern/deviations: Decreased stance time - left;Decreased step length - right;Step-to pattern;Narrow base of support Gait velocity: decreased   General Gait Details: Pt with slow, unsteady gait with increased pain through left knee during stance.Mod A for balance as patient with heavy L side lean. Patient stopping multiple times to complain of L knee pain.    Stairs            Wheelchair Mobility    Modified Rankin (Stroke Patients Only) Modified Rankin (Stroke Patients Only) Pre-Morbid Rankin Score: Moderate disability Modified Rankin: Moderately severe disability     Balance                                    Cognition Arousal/Alertness: Awake/alert Behavior During Therapy: Anxious Overall Cognitive Status: Impaired/Different from baseline Area of Impairment: Following commands;Attention;Problem solving;Memory;Orientation Orientation Level: Disoriented to;Situation Current Attention Level: Sustained Memory: Decreased short-term memory Following Commands: Follows one step commands with increased time;Follows multi-step commands inconsistently     Problem Solving: Slow processing;Requires verbal cues General Comments: Patient delayed with responses. Perserverating on L knee pain.     Exercises General Exercises - Lower Extremity Heel Slides: AAROM;Left;10 reps    General Comments        Pertinent Vitals/Pain Faces Pain Scale: Hurts even more Pain Location: Left knee Pain Descriptors / Indicators: Aching;Sore Pain Intervention(s): Patient requesting pain meds-RN notified;Monitored during session;Ice applied    Home Living  Prior Function            PT Goals (current goals can now be found in the care plan section) Progress towards PT goals: Progressing toward goals    Frequency  Min 3X/week    PT Plan Current plan remains appropriate     Co-evaluation             End of Session Equipment Utilized During Treatment: Gait belt Activity Tolerance: Patient tolerated treatment well Patient left: in chair;with call bell/phone within reach     Time: 0905-0931 PT Time Calculation (min) (ACUTE ONLY): 26 min  Charges:  $Gait Training: 8-22 mins $Therapeutic Activity: 8-22 mins                    G Codes:      Fredrich Birks 11/17/2014, 9:39 AM 11/17/2014 Fredrich Birks PTA [phone] pager [phone] office

## 2014-11-17 NOTE — Clinical Social Work Note (Signed)
Clinical Social Worker has assessed pt and pt's family present at bedside. Full psychosocial to follow.   Derenda Fennel, MSW, LCSWA [phone] 11/17/2014 12:08 PM

## 2014-11-17 NOTE — Clinical Social Work Psychosocial (Signed)
Clinical Social Work Department BRIEF PSYCHOSOCIAL ASSESSMENT 11/17/2014  Patient:  Monica Blankenship, Monica Blankenship     Account Number:  [id number]     Admit date:  11/15/2014  Clinical Social Worker:  Glendon Axe, CLINICAL SOCIAL WORKER  Date/Time:  11/17/2014 12:06 PM  Referred by:  Physician  Date Referred:  11/17/2014 Referred for  SNF Placement   Other Referral:   Interview type:  Other - See comment Other interview type:   CSW interviewed patient and pt's daughters present at bedside.    PSYCHOSOCIAL DATA Living Status:  ALONE Admitted from facility:   Level of care:   Primary support name:  Marian Sorrow (daughter) Primary support relationship to patient:  CHILD, ADULT Degree of support available:   Strong    Kellie Shropshire (daughter)    CURRENT CONCERNS Current Concerns  Post-Acute Placement   Other Concerns:    SOCIAL WORK ASSESSMENT / PLAN Clinical Social Worker met with pt and pt's daughters, Gerald Stabs and Joseph Art present at bedside in reference to post-acute placement for SNF. CSW explained SNF process and reviewed list with pt and pt's daughters at length. Pt's daughters reported pt has been a resident at [facility] in the past and wishes to return upon discharge. Pt's daugthers stated pt enjoyed her stay at [facility] and that staff were friendly and professional. Pt's daughters are very involved in pt's care and a pleasure to service. CSW contacted [facility] which confirmed pt's return. CSW is currently awaiting insurance approval. CSW remains available for continued support and to facilitate pt's discharge needs.   Assessment/plan status:  Psychosocial Support/Ongoing Assessment of Needs Other assessment/ plan:   Information/referral to community resources:   SNF information/list provided.    PATIENT'S/FAMILY'S RESPONSE TO PLAN OF CARE: Pt sitting in chair at bedside, alert and oriented X4. Pt's daughters also sitting at bedside. Pt is a little reluctant about SNF  placement and CSW explained pt's need for SNF again. Pt expressed understanding and agreed.  Pt's daughters were pleasant and appreciated social work intervention. Pt and pt's daughters all agreeable to SNF placement and is requesting placement at [facility].    Glendon Axe, MSW, LCSWA [phone] 11/17/2014 2:11 PM

## 2014-11-17 NOTE — Progress Notes (Signed)
Report given to  Burnell Blanks, Charity fundraiser at [facility] nursing facility. Pt to be transported per stretcher by ambulance with ambulance personnel to facility. Will monitor   Andrew Au I 11/17/2014 3:43 PM

## 2014-11-17 NOTE — Clinical Social Work Placement (Signed)
Clinical Social Work Department CLINICAL SOCIAL WORK PLACEMENT NOTE 11/17/2014  Patient:  Monica Blankenship, Monica Blankenship  Account Number:  [id number] Admit date:  11/15/2014  Clinical Social Worker:  Derenda Fennel, CLINICAL SOCIAL WORKER  Date/time:  11/17/2014 12:06 PM  Clinical Social Work is seeking post-discharge placement for this patient at the following level of care:   SKILLED NURSING   (*CSW will update this form in Epic as items are completed)   11/17/2014  Patient/family provided with [facility] Department of Clinical Social Work's list of facilities offering this level of care within the geographic area requested by the patient (or if unable, by the patient's family).  11/17/2014  Patient/family informed of their freedom to choose among providers that offer the needed level of care, that participate in Medicare, Medicaid or managed care program needed by the patient, have an available bed and are willing to accept the patient.  11/17/2014  Patient/family informed of [facility]' ownership interest in [facility], as well as of the fact that they are under no obligation to receive care at this facility.  PASARR submitted to EDS on EXISTING   PASARR number received on EXISTING  FL2 transmitted to all facilities in geographic area requested by pt/family on  11/17/2014 FL2 transmitted to all facilities within larger geographic area on   Patient informed that his/her managed care company has contracts with or will negotiate with  certain facilities, including the following:   YES     Patient/family informed of bed offers received:  11/17/2014 Patient chooses bed at [facility] Physician recommends and patient chooses bed at    Patient to be transferred to [facility] on  11/17/2014 Patient to be transferred to facility by [facility] Patient and family notified of transfer on 11/17/2014 Name of family member notified:  Pt's daughter, [name]  The following  physician request were entered in Epic:   Additional Comments: CSW currently awaiting insurance approval for discharge.   Derenda Fennel, MSW, LCSWA [phone] 11/17/2014 2:15 PM

## 2014-11-17 NOTE — Discharge Summary (Signed)
Physician Discharge Summary  Monica Blankenship SPQ:[id number] DOB: [birth year] DOA: 11/15/2014  PCP: Georgann Housekeeper, MD  Admit date: 11/15/2014 Discharge date: 11/17/2014  Time spent: 30 minutes  Recommendations for Outpatient Follow-up:  1. Needs SNF level care 2. Follow up with neurology in about 2 months 3. Please review medication changes below 4. 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, Bradycardias, Anemia chronic disease, Rh Arthritis + ?rheumatic heart disease, MoV disease + diastolic HF [EF 60-65%], Pulm Htn Pa Peak 55 mm hg admitted c L sided facial droop as well as L sided weakness MRI showed multifocal infarcts to brain Patient was admitted to the hospital and was not a candidate for revascularization therapy or TPA given unknown when she was last well Initial NIH SS was 4 She underwent workup as per below and noted on MRI to have right MCA territory infarct and MRA showed severe high-grade stenosis right M1 with questionable small left ICA aneurysm Carotid Doppler showed no ICA stenosis 2-D echo showed nor source of embolus Her lipid panel showed LDL 75 and she was started on high intensity statin 80 mg which can be discontinued if not felt necessary per cardiology Cardiology recommended Plavix and aspirin dual antiplatelet therapy for 3 months. After this she can start on Plavix monotherapy and she is to follow-up with cardiology as an outpatient I had a long discussion with her and her daughters and a discharge and they understand  the need for skilled nursing care. She was stable for discharge on 11/17/14  Discharge Exam: Filed Vitals:   11/17/14 0948  BP: 157/61  Pulse: 74  Temp: 97.9 F (36.6 C)  Resp: 18    General: Alert pleasant oriented no apparent distress Cardiovascular: S1-S2 no murmur rub or gallop Respiratory: Clinically clear  Discharge Instructions You were cared for by a hospitalist during your hospital stay. If you have any questions about your discharge medications or the care you received while you were in the hospital after you are discharged, you can call the unit and asked to speak with the hospitalist on call if the hospitalist that took care of you is not available. Once you are discharged, your primary care physician will handle any further medical issues. Please note that NO REFILLS for any discharge medications will be authorized once you are discharged, as it is imperative that you return to your primary care physician (or establish a relationship with a primary care physician if you do not have one) for your aftercare needs so that they can reassess your need for medications and monitor your lab values.  Discharge Instructions    Ambulatory referral to Neurology    Complete by:  As directed   Stroke patient. Dr. Pearlean Brownie prefers follow up in 2 months     Diet - low sodium heart healthy    Complete by:  As directed      Increase activity slowly    Complete by:  As directed           Current Discharge Medication List    START  taking these medications   Details  atorvastatin (LIPITOR) 80 MG tablet Take 1 tablet (80 mg total) by mouth daily at 6 PM.    clopidogrel (PLAVIX) 75 MG tablet Take 1 tablet (75 mg total) by mouth daily. Qty: 30 tablet, Refills: 0      CONTINUE these medications which have CHANGED   Details  aspirin 81 MG chewable tablet Chew 4 tablets (324 mg total) by mouth daily.    traMADol (ULTRAM) 50 MG tablet Take 1 tablet (50 mg total) by mouth every 6 (six)  hours as needed for moderate pain. Qty: 30 tablet, Refills: 0      CONTINUE these medications which have NOT CHANGED   Details  acetaminophen (TYLENOL) 500 MG tablet Take 1,000 mg by mouth every 6 (six) hours as needed for mild pain or moderate pain.    albuterol (PROVENTIL HFA;VENTOLIN HFA) 108 (90 BASE) MCG/ACT inhaler Inhale 2 puffs into the lungs every 6 (six) hours as needed for shortness of breath.    ALPRAZolam (XANAX) 0.25 MG tablet Take 1 tablet (0.25 mg total) by mouth 2 (two) times daily as needed for anxiety. Qty: 30 tablet, Refills: 0    cholecalciferol (VITAMIN D) 1000 UNITS tablet Take 1,000 Units by mouth daily.    doxazosin (CARDURA) 8 MG tablet Take 8 mg by mouth at bedtime.    ferrous sulfate 325 (65 FE) MG tablet Take 325 mg by mouth daily with breakfast.    folic acid (FOLVITE) 1 MG tablet Take 1 mg by mouth daily.    hydrALAZINE (APRESOLINE) 50 MG tablet Take 75 mg by mouth 3 (three) times daily. Take every 8 hours for hypertension - hold for SBP <110    Hypromellose (GENTEAL MILD TO MODERATE OP) Place 1 drop into both eyes daily as needed (dry eyes).    loratadine (CLARITIN) 10 MG tablet Take 10 mg by mouth daily as needed for allergies.    NON FORMULARY Oxygen---Nightly    predniSONE (DELTASONE) 5 MG tablet Take 5 mg by mouth daily with breakfast.    verapamil (CALAN-SR) 120 MG CR tablet Take 1 tablet (120 mg total) by mouth daily. Qty: 30 tablet, Refills: 3      STOP taking these medications     furosemide (LASIX) 20 MG tablet      ceFAZolin (ANCEF) 2-3 GM-% SOLR        Allergies  Allergen Reactions  . Fentanyl Other (See Comments)    confusion   Follow-up Information    Follow up with SETHI,PRAMOD, MD In 2 months.   Specialties:  Neurology, Radiology   Why:  Stroke Clinic, Office will call you with appointment date & time   Contact information:   [address] [place] [address] [phone]        The results of  significant diagnostics from this hospitalization (including imaging, microbiology, ancillary and laboratory) are listed below for reference.    Significant Diagnostic Studies: Dg Chest 2 View  11/16/2014   CLINICAL DATA:  Hypertension.  Stroke symptoms.  EXAM: CHEST  2 VIEW  COMPARISON:  Chest radiograph 09/16/2014.  FINDINGS: Stable enlarged cardiac and mediastinal contours. Mild pulmonary venous hypertension. Minimal left basilar heterogeneous pulmonary opacities. Motion artifact limits evaluation on the lateral view. Mid thoracic spine degenerative changes.  IMPRESSION: Minimal heterogeneous opacities left lung base may represent atelectasis. Infection not excluded.  Cardiomegaly with pulmonary venous hypertension.   Electronically Signed   By: Annia Belt M.D.   On:  11/16/2014 15:16   Ct Head Wo Contrast  11/15/2014   CLINICAL DATA:  Left-sided weakness, stroke protocol  EXAM: CT HEAD WITHOUT CONTRAST  TECHNIQUE: Contiguous axial images were obtained from the base of the skull through the vertex without intravenous contrast.  COMPARISON:  11/14/2014  FINDINGS: There is no evidence of mass effect, midline shift, or extra-axial fluid collections. There is no evidence of a space-occupying lesion or intracranial hemorrhage. There is no evidence of a cortical-based area of acute infarction. Old right frontal lobe infarct with encephalomalacia. There is an old right cerebellar infarct. There is generalized cerebral atrophy. There is periventricular white matter low attenuation likely secondary to microangiopathy.  The ventricles and sulci are appropriate for the patient's age. The basal cisterns are patent.  Visualized portions of the orbits are unremarkable. The visualized portions of the paranasal sinuses and mastoid air cells are unremarkable. Cerebrovascular atherosclerotic calcifications are noted.  The osseous structures are unremarkable.  IMPRESSION: 1. No acute intracranial pathology. 2. Old right  frontal lobe infarct. 3. Chronic microvascular disease and cerebral atrophy. These results were called by telephone at the time of interpretation on 11/15/2014 at 7:13 pm to Dr. Thad Ranger, who verbally acknowledged these results.   Electronically Signed   By: Elige Ko   On: 11/15/2014 19:19   Ct Head Wo Contrast  11/15/2014   CLINICAL DATA:  Altered mental status.  EXAM: CT HEAD WITHOUT CONTRAST  TECHNIQUE: Contiguous axial images were obtained from the base of the skull through the vertex without intravenous contrast.  COMPARISON:  CT scan November 17, 2013.  FINDINGS: Bony calvarium appears intact. Mild chronic ischemic white matter disease is noted. Stable subcortical low density is noted in right frontal lobe consistent with old infarction. No mass effect or midline shift is noted. Ventricular size is within normal limits. There is no evidence of mass lesion, hemorrhage or acute infarction.  IMPRESSION: Mild chronic ischemic white matter disease. 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 No acute intracranial abnormality seen.   Electronically Signed   By: Roque Lias M.D.   On: 11/15/2014 00:22   Mr Maxine Glenn Head Wo Contrast  11/15/2014   EXAM: MRI HEAD WITHOUT CONTRAST  MRA HEAD WITHOUT CONTRAST  TECHNIQUE: Multiplanar, multiecho pulse sequences of the brain and surrounding structures were obtained without intravenous contrast. Angiographic images of the head were obtained using MRA technique without contrast.  COMPARISON:  Prior CT performed earlier on the same day.  FINDINGS: MRI HEAD FINDINGS  Study is degraded by motion artifact.  Diffuse prominence of the CSF containing spaces is compatible with generalized cerebral atrophy. Patchy and confluent T2/FLAIR hyperintensity within the periventricular and deep white matter both cerebral hemispheres is most consistent with chronic small vessel ischemic disease. Encephalomalacia within the anterior right frontal lobe compatible with remote  infarct. There is a smaller remote right occipital lobe infarct as well.  Multiple foci of restricted diffusion are seen involving predominantly the deep white matter of the right frontal lobe and the right centrum semi ovale (series 3, image 34). The largest focus of restricted diffusion measures 11 mm and is seen adjacent to the frontal horn of the right lateral ventricle. There is a small focus of restricted diffusion within the right basal ganglia as well. There is involvement of the periatrial white matter on the right. Findings are consistent with acute ischemic infarcts. No associated hemorrhage or mass effect. These are position in somewhat of a linear fashion within the deep white matter of  the right cerebral hemisphere, and may be watershed in nature.  No mass lesion or midline shift. No hydrocephalus. No extra-axial fluid collection.  Craniocervical junction grossly normal. Node definite abnormality seen within the pituitary gland. Orbits within normal limits.  Visualized bone marrow signal intensity is normal.  Paranasal sinuses and mastoid air cells are clear.  MRA HEAD FINDINGS  ANTERIOR CIRCULATION:  Visualized portions of the distal cervical segments of the internal carotid arteries are widely patent bilaterally with antegrade flow. Mild to moderate multi focal atherosclerotic irregularity seen within the petrous and cavernous ICAs bilaterally without hemodynamically significant stenosis. A 4 mm focal outpouching arising from the lateral aspect of the cavernous left ICA on time-of-flight sequence may reflect a small aneurysm versus artifact (series 12, image 61). This is directed laterally and slightly posteriorly.  A1 segments, anterior communicating artery, and anterior cerebral arteries are opacified bilaterally.  There is a severe high-grade stenosis measuring 4 mm in length within the mid aspect of the right M1 segment (series 12, image 7). Right M1 segment is opacified distally, although flow  within the distal right MCA branches is attenuated as compared to the left.  Mild multi focal atherosclerotic irregularity seen within the left MCA artery and its branches without hemodynamically significant stenosis or proximal branch occlusion.  POSTERIOR CIRCULATION:  Distal vertebral arteries are patent bilaterally. Posterior inferior cerebral arteries not well evaluated on this exam. Vertebrobasilar junction normal. Multi focal atherosclerotic irregularity seen within the basilar artery without hemodynamically significant stenosis. Superior cerebellar arteries patent bilaterally. Multi focal atherosclerotic irregularity seen within the PCAs bilaterally, left slightly worse than right. There is associated probable several areas of moderate stenosis within the left P2 segment.  IMPRESSION: MRI HEAD IMPRESSION:  1. Multi focal ischemic infarcts involving the right MCA territory distribution. These infarcts are largely located in a watershed distribution involving the deep white matter of the right centrum semi ovale. No associated hemorrhage or significant mass effect. 2. Remote right frontal and right occipital lobe infarcts. 3. Atrophy with moderate chronic microvascular ischemic disease.  MRA HEAD IMPRESSION:  1. Focal severe high-grade stenosis measuring approximately 4 mm in length within the mid right M1 segment. Flow is attenuated within the distal right MCA artery branches. 2. Additional multi focal atherosclerotic irregularity throughout the intracranial circulation. 3. 4 mm somewhat linear focal outpouching arising from the lateral aspect of the cavernous left ICA. This may reflect artifact versus a small aneurysm. A follow-up study in 3 months to document stability may be helpful for further evaluation.   Electronically Signed   By: Rise Mu M.D.   On: 11/15/2014 22:50   Mr Brain Wo Contrast  11/15/2014   EXAM: MRI HEAD WITHOUT CONTRAST  MRA HEAD WITHOUT CONTRAST  TECHNIQUE: Multiplanar,  multiecho pulse sequences of the brain and surrounding structures were obtained without intravenous contrast. Angiographic images of the head were obtained using MRA technique without contrast.  COMPARISON:  Prior CT performed earlier on the same day.  FINDINGS: MRI HEAD FINDINGS  Study is degraded by motion artifact.  Diffuse prominence of the CSF containing spaces is compatible with generalized cerebral atrophy. Patchy and confluent T2/FLAIR hyperintensity within the periventricular and deep white matter both cerebral hemispheres is most consistent with chronic small vessel ischemic disease. Encephalomalacia within the anterior right frontal lobe compatible with remote infarct. There is a smaller remote right occipital lobe infarct as well.  Multiple foci of restricted diffusion are seen involving predominantly the deep white matter of the right  frontal lobe and the right centrum semi ovale (series 3, image 34). The largest focus of restricted diffusion measures 11 mm and is seen adjacent to the frontal horn of the right lateral ventricle. There is a small focus of restricted diffusion within the right basal ganglia as well. There is involvement of the periatrial white matter on the right. Findings are consistent with acute ischemic infarcts. No associated hemorrhage or mass effect. These are position in somewhat of a linear fashion within the deep white matter of the right cerebral hemisphere, and may be watershed in nature.  No mass lesion or midline shift. No hydrocephalus. No extra-axial fluid collection.  Craniocervical junction grossly normal. Node definite abnormality seen within the pituitary gland. Orbits within normal limits.  Visualized bone marrow signal intensity is normal.  Paranasal sinuses and mastoid air cells are clear.  MRA HEAD FINDINGS  ANTERIOR CIRCULATION:  Visualized portions of the distal cervical segments of the internal carotid arteries are widely patent bilaterally with antegrade flow.  Mild to moderate multi focal atherosclerotic irregularity seen within the petrous and cavernous ICAs bilaterally without hemodynamically significant stenosis. A 4 mm focal outpouching arising from the lateral aspect of the cavernous left ICA on time-of-flight sequence may reflect a small aneurysm versus artifact (series 12, image 61). This is directed laterally and slightly posteriorly.  A1 segments, anterior communicating artery, and anterior cerebral arteries are opacified bilaterally.  There is a severe high-grade stenosis measuring 4 mm in length within the mid aspect of the right M1 segment (series 12, image 7). Right M1 segment is opacified distally, although flow within the distal right MCA branches is attenuated as compared to the left.  Mild multi focal atherosclerotic irregularity seen within the left MCA artery and its branches without hemodynamically significant stenosis or proximal branch occlusion.  POSTERIOR CIRCULATION:  Distal vertebral arteries are patent bilaterally. Posterior inferior cerebral arteries not well evaluated on this exam. Vertebrobasilar junction normal. Multi focal atherosclerotic irregularity seen within the basilar artery without hemodynamically significant stenosis. Superior cerebellar arteries patent bilaterally. Multi focal atherosclerotic irregularity seen within the PCAs bilaterally, left slightly worse than right. There is associated probable several areas of moderate stenosis within the left P2 segment.  IMPRESSION: MRI HEAD IMPRESSION:  1. Multi focal ischemic infarcts involving the right MCA territory distribution. These infarcts are largely located in a watershed distribution involving the deep white matter of the right centrum semi ovale. No associated hemorrhage or significant mass effect. 2. Remote right frontal and right occipital lobe infarcts. 3. Atrophy with moderate chronic microvascular ischemic disease.  MRA HEAD IMPRESSION:  1. Focal severe high-grade stenosis  measuring approximately 4 mm in length within the mid right M1 segment. Flow is attenuated within the distal right MCA artery branches. 2. Additional multi focal atherosclerotic irregularity throughout the intracranial circulation. 3. 4 mm somewhat linear focal outpouching arising from the lateral aspect of the cavernous left ICA. This may reflect artifact versus a small aneurysm. A follow-up study in 3 months to document stability may be helpful for further evaluation.   Electronically Signed   By: Rise Mu M.D.   On: 11/15/2014 22:50    Microbiology: No results found for this or any previous visit (from the past 240 hour(s)).   Labs: Basic Metabolic Panel:  Recent Labs Lab 11/14/14 2030 11/15/14 1851 11/15/14 1905 11/16/14 0620  NA 141 140 141 143  K 4.6 4.1 4.0 3.9  CL 102 102 104 106  CO2 25 25  --  25  GLUCOSE 148* 141* 144* 93  BUN CREATININE 0.95 0.84 1.00 0.80  CALCIUM 10.2 9.7  --  9.7   Liver Function Tests:  Recent Labs Lab 11/14/14 2030 11/15/14 1851 11/16/14 0620  AST ALT ALKPHOS 73 73 70  BILITOT 0.6 0.5 0.6  PROT 8.5* 7.8 7.4  ALBUMIN 4.1 3.7 3.6   No results for input(s): LIPASE, AMYLASE in the last 168 hours. No results for input(s): AMMONIA in the last 168 hours. CBC:  Recent Labs Lab 11/14/14 2030 11/15/14 1851 11/15/14 1905 11/16/14 0620  WBC 7.4 8.3  --  6.8  NEUTROABS  --  5.9  --  4.1  HGB 12.1 11.8* 13.3 11.7*  HCT 36.9 36.1 39.0 36.1  MCV 98.4 97.0  --  97.3  PLT 199 184  --  189   Cardiac Enzymes: No results for input(s): CKTOTAL, CKMB, CKMBINDEX, TROPONINI in the last 168 hours. BNP: BNP (last 3 results)  Recent Labs  04/08/14 0807 04/09/14 0909  PROBNP 5143.0* 16685.0*   CBG: No results for input(s): GLUCAP in the last 168 hours.     SignedRhetta Mura  [facility] 11/17/2014, 11:34 AM

## 2014-11-17 NOTE — Clinical Social Work Note (Signed)
Clinical Social Worker facilitated patient discharge including contacting patient family and facility to confirm patient discharge plans.  Clinical information faxed to facility and family agreeable with plan.  CSW arranged ambulance transport via [facility] to [facility].  RN to call report prior to discharge.  DC packet on chart with number to call report.   Clinical Social Worker will sign off for now as social work intervention is no longer needed. Please consult [place] again if new need arises.  Derenda Fennel, MSW, LCSWA [phone] 11/17/2014 3:19 PM

## 2014-12-14 ENCOUNTER — Inpatient Hospital Stay (HOSPITAL_COMMUNITY)
Admission: EM | Admit: 2014-12-14 | Discharge: 2014-12-22 | DRG: 064 | Disposition: A | Discharge status: Skilled Nursing Facility | Payer: Medicare PPO | Attending: Internal Medicine | Admitting: Internal Medicine

## 2014-12-14 ENCOUNTER — Emergency Department (HOSPITAL_COMMUNITY): Payer: Medicare PPO

## 2014-12-14 ENCOUNTER — Encounter (HOSPITAL_COMMUNITY): Payer: Self-pay | Admitting: Emergency Medicine

## 2014-12-14 DIAGNOSIS — G9340 Encephalopathy, unspecified: Secondary | ICD-10-CM

## 2014-12-14 DIAGNOSIS — E119 Type 2 diabetes mellitus without complications: Secondary | ICD-10-CM | POA: Diagnosis present

## 2014-12-14 DIAGNOSIS — Z7902 Long term (current) use of antithrombotics/antiplatelets: Secondary | ICD-10-CM | POA: Diagnosis not present

## 2014-12-14 DIAGNOSIS — M069 Rheumatoid arthritis, unspecified: Secondary | ICD-10-CM | POA: Diagnosis present

## 2014-12-14 DIAGNOSIS — Z9119 Patient's noncompliance with other medical treatment and regimen: Secondary | ICD-10-CM

## 2014-12-14 DIAGNOSIS — R569 Unspecified convulsions: Secondary | ICD-10-CM

## 2014-12-14 DIAGNOSIS — I69392 Facial weakness following cerebral infarction: Secondary | ICD-10-CM

## 2014-12-14 DIAGNOSIS — I080 Rheumatic disorders of both mitral and aortic valves: Secondary | ICD-10-CM | POA: Diagnosis present

## 2014-12-14 DIAGNOSIS — I272 Other secondary pulmonary hypertension: Secondary | ICD-10-CM | POA: Diagnosis present

## 2014-12-14 DIAGNOSIS — I129 Hypertensive chronic kidney disease with stage 1 through stage 4 chronic kidney disease, or unspecified chronic kidney disease: Secondary | ICD-10-CM | POA: Diagnosis present

## 2014-12-14 DIAGNOSIS — I69354 Hemiplegia and hemiparesis following cerebral infarction affecting left non-dominant side: Secondary | ICD-10-CM | POA: Diagnosis not present

## 2014-12-14 DIAGNOSIS — Z7982 Long term (current) use of aspirin: Secondary | ICD-10-CM

## 2014-12-14 DIAGNOSIS — I639 Cerebral infarction, unspecified: Secondary | ICD-10-CM

## 2014-12-14 DIAGNOSIS — Z87891 Personal history of nicotine dependence: Secondary | ICD-10-CM | POA: Diagnosis not present

## 2014-12-14 DIAGNOSIS — E876 Hypokalemia: Secondary | ICD-10-CM | POA: Diagnosis not present

## 2014-12-14 DIAGNOSIS — R000 Tachycardia, unspecified: Secondary | ICD-10-CM

## 2014-12-14 DIAGNOSIS — B370 Candidal stomatitis: Secondary | ICD-10-CM | POA: Diagnosis present

## 2014-12-14 DIAGNOSIS — N189 Chronic kidney disease, unspecified: Secondary | ICD-10-CM | POA: Diagnosis present

## 2014-12-14 DIAGNOSIS — I5032 Chronic diastolic (congestive) heart failure: Secondary | ICD-10-CM | POA: Diagnosis present

## 2014-12-14 DIAGNOSIS — I351 Nonrheumatic aortic (valve) insufficiency: Secondary | ICD-10-CM | POA: Diagnosis present

## 2014-12-14 DIAGNOSIS — E785 Hyperlipidemia, unspecified: Secondary | ICD-10-CM | POA: Diagnosis present

## 2014-12-14 DIAGNOSIS — R509 Fever, unspecified: Secondary | ICD-10-CM

## 2014-12-14 DIAGNOSIS — R4689 Other symptoms and signs involving appearance and behavior: Secondary | ICD-10-CM

## 2014-12-14 DIAGNOSIS — Z8673 Personal history of transient ischemic attack (TIA), and cerebral infarction without residual deficits: Secondary | ICD-10-CM

## 2014-12-14 DIAGNOSIS — I63511 Cerebral infarction due to unspecified occlusion or stenosis of right middle cerebral artery: Secondary | ICD-10-CM | POA: Diagnosis present

## 2014-12-14 DIAGNOSIS — R4182 Altered mental status, unspecified: Secondary | ICD-10-CM | POA: Diagnosis not present

## 2014-12-14 DIAGNOSIS — I350 Nonrheumatic aortic (valve) stenosis: Secondary | ICD-10-CM

## 2014-12-14 DIAGNOSIS — I050 Rheumatic mitral stenosis: Secondary | ICD-10-CM | POA: Diagnosis present

## 2014-12-14 DIAGNOSIS — R410 Disorientation, unspecified: Secondary | ICD-10-CM

## 2014-12-14 DIAGNOSIS — I10 Essential (primary) hypertension: Secondary | ICD-10-CM

## 2014-12-14 LAB — CBC WITH DIFFERENTIAL/PLATELET
Basophils Absolute: 0 10*3/uL (ref 0.0–0.1)
Basophils Relative: 0 % (ref 0–1)
EOS PCT: 1 % (ref 0–5)
Eosinophils Absolute: 0.1 10*3/uL (ref 0.0–0.7)
HEMATOCRIT: 40.7 % (ref 36.0–46.0)
Hemoglobin: 13.8 g/dL (ref 12.0–15.0)
LYMPHS PCT: 22 % (ref 12–46)
Lymphs Abs: 2 10*3/uL (ref 0.7–4.0)
MCH: 33 pg (ref 26.0–34.0)
MCHC: 33.9 g/dL (ref 30.0–36.0)
MCV: 97.4 fL (ref 78.0–100.0)
MONO ABS: 1 10*3/uL (ref 0.1–1.0)
Monocytes Relative: 11 % (ref 3–12)
Neutro Abs: 6.2 10*3/uL (ref 1.7–7.7)
Neutrophils Relative %: 66 % (ref 43–77)
Platelets: 166 10*3/uL (ref 150–400)
RBC: 4.18 MIL/uL (ref 3.87–5.11)
RDW: 15.2 % (ref 11.5–15.5)
WBC: 9.4 10*3/uL (ref 4.0–10.5)

## 2014-12-14 LAB — COMPREHENSIVE METABOLIC PANEL
ALBUMIN: 3.7 g/dL (ref 3.5–5.2)
ALT: 25 U/L (ref 0–35)
ANION GAP: 13 (ref 5–15)
AST: 37 U/L (ref 0–37)
Alkaline Phosphatase: 74 U/L (ref 39–117)
BUN: 5 mg/dL — AB (ref 6–23)
CO2: 22 mmol/L (ref 19–32)
CREATININE: 0.85 mg/dL (ref 0.50–1.10)
Calcium: 9.6 mg/dL (ref 8.4–10.5)
Chloride: 106 mEq/L (ref 96–112)
GFR calc Af Amer: 71 mL/min — ABNORMAL LOW (ref >90)
GFR calc non Af Amer: 62 mL/min — ABNORMAL LOW (ref >90)
Glucose, Bld: 96 mg/dL (ref 70–99)
POTASSIUM: 3.3 mmol/L — AB (ref 3.5–5.1)
Sodium: 141 mmol/L (ref 135–145)
Total Bilirubin: 1.5 mg/dL — ABNORMAL HIGH (ref 0.3–1.2)
Total Protein: 7.4 g/dL (ref 6.0–8.3)

## 2014-12-14 LAB — URINALYSIS, ROUTINE W REFLEX MICROSCOPIC
Bilirubin Urine: NEGATIVE
Glucose, UA: NEGATIVE mg/dL
Hgb urine dipstick: NEGATIVE
Ketones, ur: 15 mg/dL — AB
LEUKOCYTES UA: NEGATIVE
Nitrite: NEGATIVE
PROTEIN: NEGATIVE mg/dL
Specific Gravity, Urine: 1.018 (ref 1.005–1.030)
Urobilinogen, UA: 1 mg/dL (ref 0.0–1.0)
pH: 7 (ref 5.0–8.0)

## 2014-12-14 LAB — I-STAT CG4 LACTIC ACID, ED
LACTIC ACID, VENOUS: 1.05 mmol/L (ref 0.5–2.2)
LACTIC ACID, VENOUS: 0.7 mmol/L (ref 0.5–2.2)
Lactic Acid, Venous: 0.74 mmol/L (ref 0.5–2.2)

## 2014-12-14 LAB — D-DIMER, QUANTITATIVE (NOT AT ARMC): D DIMER QUANT: 4.05 ug{FEU}/mL — AB (ref 0.00–0.48)

## 2014-12-14 LAB — LIPASE, BLOOD: Lipase: 36 U/L (ref 11–59)

## 2014-12-14 MED ORDER — BISACODYL 5 MG PO TBEC: 5.0000 mg | DELAYED_RELEASE_TABLET | ORAL | Status: DC | PRN

## 2014-12-14 MED ORDER — DEXTROSE 5 % IV SOLN
1.0000 g | Freq: Once | INTRAVENOUS | Status: AC
  Administered 2014-12-14: 1 g via INTRAVENOUS
  Filled 2014-12-14: qty 1

## 2014-12-14 MED ORDER — ALPRAZOLAM 0.25 MG PO TABS: 0.2500 mg | ORAL_TABLET | Freq: Two times a day (BID) | ORAL | Status: DC | PRN

## 2014-12-14 MED ORDER — ATORVASTATIN CALCIUM 80 MG PO TABS
80.0000 mg | ORAL_TABLET | Freq: Every day | ORAL | Status: DC
  Administered 2014-12-15 – 2014-12-20 (×5): 80 mg via ORAL
  Filled 2014-12-14 (×7): qty 1

## 2014-12-14 MED ORDER — CLOPIDOGREL BISULFATE 75 MG PO TABS
75.0000 mg | ORAL_TABLET | Freq: Every day | ORAL | Status: DC
  Administered 2014-12-16 – 2014-12-22 (×6): 75 mg via ORAL
  Filled 2014-12-14 (×7): qty 1

## 2014-12-14 MED ORDER — FOLIC ACID 1 MG PO TABS
1.0000 mg | ORAL_TABLET | Freq: Every day | ORAL | Status: DC
  Administered 2014-12-16 – 2014-12-22 (×6): 1 mg via ORAL
  Filled 2014-12-14 (×7): qty 1

## 2014-12-14 MED ORDER — SODIUM CHLORIDE 0.9 % IV BOLUS (SEPSIS)
1000.0000 mL | INTRAVENOUS | Status: AC
  Administered 2014-12-14 (×2): 1000 mL via INTRAVENOUS

## 2014-12-14 MED ORDER — FERROUS SULFATE 325 (65 FE) MG PO TABS
325.0000 mg | ORAL_TABLET | Freq: Every day | ORAL | Status: DC
Start: 2014-12-15 — End: 2014-12-22
  Administered 2014-12-16 – 2014-12-22 (×6): 325 mg via ORAL
  Filled 2014-12-14 (×7): qty 1

## 2014-12-14 MED ORDER — VITAMIN D 1000 UNITS PO TABS
1000.0000 [IU] | ORAL_TABLET | Freq: Every day | ORAL | Status: DC
  Administered 2014-12-16 – 2014-12-22 (×6): 1000 [IU] via ORAL
  Filled 2014-12-14 (×7): qty 1

## 2014-12-14 MED ORDER — SODIUM CHLORIDE 0.9 % IV SOLN
INTRAVENOUS | Status: DC
  Administered 2014-12-15 – 2014-12-16 (×2): via INTRAVENOUS

## 2014-12-14 MED ORDER — CARBOXYMETHYLCELLULOSE SODIUM 1 % OP SOLN: 1.0000 [drp] | Freq: Every day | OPHTHALMIC | Status: DC

## 2014-12-14 MED ORDER — ASPIRIN 81 MG PO CHEW
324.0000 mg | CHEWABLE_TABLET | Freq: Every day | ORAL | Status: DC
Start: 2014-12-15 — End: 2014-12-22
  Administered 2014-12-16 – 2014-12-22 (×6): 324 mg via ORAL
  Filled 2014-12-14 (×7): qty 4

## 2014-12-14 MED ORDER — DOXAZOSIN MESYLATE 8 MG PO TABS
8.0000 mg | ORAL_TABLET | Freq: Every day | ORAL | Status: DC
  Administered 2014-12-15 – 2014-12-21 (×6): 8 mg via ORAL
  Filled 2014-12-14 (×7): qty 1

## 2014-12-14 MED ORDER — VERAPAMIL HCL ER 120 MG PO TBCR
120.0000 mg | EXTENDED_RELEASE_TABLET | Freq: Every day | ORAL | Status: DC
Start: 2014-12-15 — End: 2014-12-22
  Administered 2014-12-16 – 2014-12-22 (×6): 120 mg via ORAL
  Filled 2014-12-14 (×8): qty 1

## 2014-12-14 MED ORDER — DICLOFENAC SODIUM 1 % TD GEL
4.0000 g | Freq: Three times a day (TID) | TRANSDERMAL | Status: DC
  Administered 2014-12-15 – 2014-12-22 (×15): 4 g via TOPICAL
  Filled 2014-12-14: qty 100

## 2014-12-14 MED ORDER — PREDNISONE 5 MG PO TABS
5.0000 mg | ORAL_TABLET | Freq: Every day | ORAL | Status: DC
  Administered 2014-12-16 – 2014-12-22 (×6): 5 mg via ORAL
  Filled 2014-12-14 (×7): qty 1

## 2014-12-14 MED ORDER — POLYETHYLENE GLYCOL 3350 17 G PO PACK
17.0000 g | PACK | Freq: Every day | ORAL | Status: DC | PRN
  Administered 2014-12-20: 17 g via ORAL
  Filled 2014-12-14: qty 1

## 2014-12-14 MED ORDER — HYDRALAZINE HCL 50 MG PO TABS
75.0000 mg | ORAL_TABLET | Freq: Three times a day (TID) | ORAL | Status: DC
  Administered 2014-12-15 – 2014-12-21 (×15): 75 mg via ORAL
  Filled 2014-12-14 (×34): qty 1

## 2014-12-14 MED ORDER — POTASSIUM CHLORIDE 10 MEQ/100ML IV SOLN
10.0000 meq | INTRAVENOUS | Status: AC
  Administered 2014-12-15 (×2): 10 meq via INTRAVENOUS
  Filled 2014-12-14: qty 100

## 2014-12-14 MED ORDER — SODIUM CHLORIDE 0.9 % IJ SOLN
3.0000 mL | Freq: Two times a day (BID) | INTRAMUSCULAR | Status: DC
  Administered 2014-12-15 – 2014-12-21 (×11): 3 mL via INTRAVENOUS

## 2014-12-14 MED ORDER — ENSURE COMPLETE PO LIQD
237.0000 mL | Freq: Three times a day (TID) | ORAL | Status: DC
  Administered 2014-12-15 – 2014-12-21 (×5): 237 mL via ORAL
  Filled 2014-12-14 (×24): qty 237

## 2014-12-14 MED ORDER — POLYETHYL GLYCOL-PROPYL GLYCOL 0.4-0.3 % OP SOLN: 1.0000 [drp] | Freq: Three times a day (TID) | OPHTHALMIC | Status: DC

## 2014-12-14 MED ORDER — DEXTROSE 5 % IV SOLN
1.0000 g | Freq: Three times a day (TID) | INTRAVENOUS | Status: DC
  Filled 2014-12-14: qty 1

## 2014-12-14 MED ORDER — SODIUM CHLORIDE 0.9 % IV BOLUS (SEPSIS)
500.0000 mL | INTRAVENOUS | Status: AC
  Administered 2014-12-14: 500 mL via INTRAVENOUS

## 2014-12-14 MED ORDER — POLYVINYL ALCOHOL 1.4 % OP SOLN
1.0000 [drp] | OPHTHALMIC | Status: DC | PRN
  Filled 2014-12-14: qty 15

## 2014-12-14 MED ORDER — HEPARIN SODIUM (PORCINE) 5000 UNIT/ML IJ SOLN
5000.0000 [IU] | Freq: Three times a day (TID) | INTRAMUSCULAR | Status: DC
  Administered 2014-12-15 – 2014-12-22 (×21): 5000 [IU] via SUBCUTANEOUS
  Filled 2014-12-14 (×21): qty 1

## 2014-12-14 MED ORDER — ACETAMINOPHEN 500 MG PO TABS
1000.0000 mg | ORAL_TABLET | Freq: Four times a day (QID) | ORAL | Status: DC | PRN
  Administered 2014-12-17: 1000 mg via ORAL
  Filled 2014-12-14: qty 2

## 2014-12-14 NOTE — ED Notes (Addendum)
Per EMS- [facility] post stroke (L sided deficits). facility reports pt has been refusing meds x 1 week (pt sts they make her tired). These meds included an antibiotic for UTI. According to facility pt has had decreased mental status and episodes of being combative. Pt conscious a&ox4. Temp 100.6 hr 110 bp 150 systolic.

## 2014-12-14 NOTE — H&P (Signed)
[facility] History and Physical  Monica Blankenship WUJ:[id number] DOB: 20-Jan-1929 DOA: 12/14/2014  Referring physician: ED physician PCP: Georgann Housekeeper, MD  Specialists:   Chief Complaint: AMS  HPI: Monica Blankenship is a 81 y.o. 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  Patient is confused. I could not reach family members. Medical history was obtained per PED and EMS. Patient was recently hospitalized from 12/14 to 12/16 because of stroke. She was discharged to the nursing home for rehabilitation. In the past several days, patient became confused. She refused taking her medications in last week, including antibiotics for UTI. When I evaluated patient in emergency room, patient looks comfortable in bed. She is alert, but not oriented 3. Patient seems not to have any pain in anywhere. She answered some questions, states that she does not have chest pain, abdominal pain, shortness of breath. She states that she has hoarseness in her throat, but no sore throat, runny nose or cough.   Work up in the ED demonstrates negative chest x-ray for acute abnormalities. Lactate 0.7. Negative urinalysis for UTI. Potassium is 3.3. Total bilirubin is 1.5, AST 37, ALT 25, ALP 74. Patient is admitted to inpatient for further evaluation and treatment.  Review of Systems: As presented in the history of presenting illness, rest negative.  Where does patient live?  SNF Can patient participate in ADLs? NONE  Allergy:  Allergies  Allergen Reactions  . Fentanyl Other (See Comments)    confusion    Past Medical History  Diagnosis Date  . Allergy   . Anxiety   . Insomnia   . Osteoarthritis   . Anemia   . Obesity   . Diastolic dysfunction   . CKD (chronic kidney  disease)   . Mitral stenosis     with moderate MR on echo 4/14  . Aortic stenosis     mild by echo 4/14  . Aortic regurgitation     mild to moderate by echo 4/14  . HTN (hypertension)     Past Surgical History  Procedure Laterality Date  . Eye surgery    . Abdominal hysterectomy    . Appendectomy    . Cataract extraction, bilateral    . Tee without cardioversion N/A 04/13/2014    Procedure: TRANSESOPHAGEAL ECHOCARDIOGRAM (TEE);  Surgeon: Vesta Mixer, MD;  Location: [facility] ENDOSCOPY;  Service: Cardiovascular;  Laterality: N/A;    Social History:  reports that she has quit smoking. She has never used smokeless tobacco. She reports that she does not drink alcohol or use illicit drugs.  Family History:  Family History  Problem Relation Age of Onset  . Hypertension Mother   . CVA Mother      Prior to Admission medications   Medication Sig Start Date End Date Taking? Authorizing Provider  acetaminophen (TYLENOL) 500 MG tablet Take 1,000 mg by mouth every 6 (six) hours as needed for mild pain or moderate pain.   Yes Historical Provider, MD  aspirin 81 MG chewable tablet Chew 4 tablets (324 mg total) by mouth daily. 11/17/14  Yes Rhetta Mura, MD  atorvastatin (LIPITOR) 80 MG tablet Take 1 tablet (80 mg total) by mouth daily at 6 PM. 11/17/14  Yes Rhetta Mura, MD  bisacodyl (DULCOLAX) 5 MG EC tablet Take 5 mg by mouth as needed for severe constipation.  Yes Historical Provider, MD  carboxymethylcellulose (REFRESH) 1 % ophthalmic solution Apply 1 drop to eye at bedtime.   Yes Historical Provider, MD  cholecalciferol (VITAMIN D) 1000 UNITS tablet Take 1,000 Units by mouth daily.   Yes Historical Provider, MD  clopidogrel (PLAVIX) 75 MG tablet Take 1 tablet (75 mg total) by mouth daily. 11/17/14  Yes Rhetta Mura, MD  diclofenac sodium (VOLTAREN) 1 % GEL Apply 4 g topically 3 (three) times daily.   Yes Historical Provider, MD  doxazosin (CARDURA) 8 MG tablet Take 8  mg by mouth at bedtime.   Yes Historical Provider, MD  ENSURE (ENSURE) Take 237 mLs by mouth 3 (three) times daily between meals.   Yes Historical Provider, MD  ferrous sulfate 325 (65 FE) MG tablet Take 325 mg by mouth daily with breakfast.   Yes Historical Provider, MD  folic acid (FOLVITE) 1 MG tablet Take 1 mg by mouth daily.   Yes Historical Provider, MD  hydrALAZINE (APRESOLINE) 50 MG tablet Take 75 mg by mouth 3 (three) times daily. Take every 8 hours for hypertension - hold for SBP <110   Yes Historical Provider, MD  LORazepam (ATIVAN) 2 MG/ML concentrated solution Take 1 mg by mouth once.   Yes Historical Provider, MD  nitrofurantoin, macrocrystal-monohydrate, (MACROBID) 100 MG capsule Take 100 mg by mouth 2 (two) times daily. Started 12/13/14, for 7 days ending 12/19/14   Yes Historical Provider, MD  NON FORMULARY Inhale into the lungs at bedtime. Oxygen---Nightly   Yes Historical Provider, MD  Polyethyl Glycol-Propyl Glycol (SYSTANE) 0.4-0.3 % SOLN Apply 1 drop to eye 3 (three) times daily.   Yes Historical Provider, MD  polyethylene glycol (MIRALAX / GLYCOLAX) packet Take 17 g by mouth at bedtime.   Yes Historical Provider, MD  potassium chloride SA (K-DUR,KLOR-CON) 20 MEQ tablet Take 20 mEq by mouth once.   Yes Historical Provider, MD  predniSONE (DELTASONE) 5 MG tablet Take 5 mg by mouth daily with breakfast.   Yes Historical Provider, MD  traMADol (ULTRAM) 50 MG tablet Take 1 tablet (50 mg total) by mouth every 6 (six) hours as needed for moderate pain. 11/17/14  Yes Rhetta Mura, MD  verapamil (CALAN-SR) 120 MG CR tablet Take 1 tablet (120 mg total) by mouth daily. 05/24/14  Yes Georgann Housekeeper, MD  ALPRAZolam Prudy Feeler) 0.25 MG tablet Take 1 tablet (0.25 mg total) by mouth 2 (two) times daily as needed for anxiety. 05/24/14   Georgann Housekeeper, MD    Physical Exam: Filed Vitals:   12/14/14 1930 12/14/14 1945 12/14/14 2115 12/14/14 2130  BP:  162/86 158/92 161/76  Pulse:  110 113 113   Temp:      TempSrc:      Resp:  17 12 14   Height:      Weight:      SpO2: 94% 95% 96% 96%   General: Not in acute distress HEENT:       Eyes: PERRL, EOMI, no scleral icterus       ENT: No discharge from the ears and nose, no pharynx injection, no tonsillar enlargement.        Neck: No JVD, no bruit, no mass felt. Cardiac: S1/S2, RRR, No murmurs, No gallops or rubs Pulm: Good air movement bilaterally. Clear to auscultation bilaterally. No rales, wheezing, rhonchi or rubs. Abd: Soft, nondistended, nontender, no rebound pain, no organomegaly, BS present Ext: No edema bilaterally. 2+DP/PT pulse bilaterally Musculoskeletal: No joint deformities, erythema, or stiffness, ROM full Skin: No rashes.  Neuro: Alert and NOT oriented X3, cranial nerves II-XII grossly intact except for left facial droop,  muscle strength 2/5 in left arm and leg, Brachial reflex 1+ bilaterally. Knee reflex 1+ bilaterally. Negative Babinski's sign.  Psych: Patient is not psychotic, no suicidal or hemocidal ideation.  Labs on Admission:  Basic Metabolic Panel:  Recent Labs Lab 12/14/14 1727  NA 141  K 3.3*  CL 106  CO2 22  GLUCOSE 96  BUN 5*  CREATININE 0.85  CALCIUM 9.6   Liver Function Tests:  Recent Labs Lab 12/14/14 1727  AST 37  ALT 25  ALKPHOS 74  BILITOT 1.5*  PROT 7.4  ALBUMIN 3.7   No results for input(s): LIPASE, AMYLASE in the last 168 hours. No results for input(s): AMMONIA in the last 168 hours. CBC:  Recent Labs Lab 12/14/14 1727  WBC 9.4  NEUTROABS 6.2  HGB 13.8  HCT 40.7  MCV 97.4  PLT 166   Cardiac Enzymes: No results for input(s): CKTOTAL, CKMB, CKMBINDEX, TROPONINI in the last 168 hours.  BNP (last 3 results)  Recent Labs  04/08/14 0807 04/09/14 0909  PROBNP 5143.0* 16685.0*   CBG: No results for input(s): GLUCAP in the last 168 hours.  Radiological Exams on Admission: Ct Head Wo Contrast  12/14/2014   CLINICAL DATA:  Post stroke with LEFT-sided  deficits, patient has been refusing medications for 1 week, now with decrease mental status an episodes of being combative, history hypertension, diastolic dysfunction, former smoker  EXAM: CT HEAD WITHOUT CONTRAST  TECHNIQUE: Contiguous axial images were obtained from the base of the skull through the vertex without intravenous contrast.  COMPARISON:  11/15/2014  FINDINGS: Mild generalized atrophy.  Normal ventricular morphology.  No midline shift or mass effect.  Small vessel chronic ischemic changes of deep cerebral white matter.  Old RIGHT frontal infarct.  No intracranial hemorrhage, mass lesion, or evidence acute infarction.  No extra-axial fluid collections.  Atherosclerotic calcifications of the carotid siphons.  Hyperostosis frontalis interna.  No acute bone or sinus abnormalities.  IMPRESSION: Atrophy with small vessel chronic ischemic changes of deep cerebral white matter.  Old RIGHT frontal infarct.  No acute intracranial abnormalities.   Electronically Signed   By: Ulyses Southward M.D.   On: 12/14/2014 21:13   Dg Chest Port 1 View  12/14/2014   CLINICAL DATA:  Urinary tract infection, decreasing mental status for 1 week  EXAM: PORTABLE CHEST - 1 VIEW  COMPARISON:  11/16/2014  FINDINGS: Borderline cardiomegaly. No acute infiltrate or pulmonary edema. Atherosclerotic calcifications of thoracic aorta mild degenerative changes bilateral shoulders.  IMPRESSION: Borderline cardiomegaly.  No active disease.   Electronically Signed   By: Natasha Mead M.D.   On: 12/14/2014 19:33    EKG: will get one  Assessment/Plan Principal Problem:   Encephalopathy acute Active Problems:   Mitral stenosis   Aortic stenosis   Aortic regurgitation   Chronic diastolic CHF (congestive heart failure)   Pulmonary HTN   HTN (hypertension)   Stroke   Hypertension   Acute encephalopathy   Hypokalemia  Acute encephalopathy: Etiology is not clear. Patient has mild fever. Her body temperature was 100.9 initially, then  became 99.1. Urinalysis is negative for UTI. Chest x-ray is negative for acute abnormalities. No obvious source of infection was identified. Other differential diagnoses include, delirium, ACS and new stroke. Another important differential diagnosis is pulmonary embolism given that patient has persistent tachycardia and decreased mobility in nursing home. Her d-dimer is elevated at 4.08. Since  patient is confused, it is difficult to assess whether she has pain anywhere. ED started cefepime for possible sepsis. I will hold this antibiotics since no clear signs of infection identified.  -will admit to telemetry bed - Frequent neuro check -will get CTA to r/o PE -follow up blood and urine culture -IVF: NS 75 cc/h -check trop x 3 -get EKG -check UDS -check BNP  Hx of stroke: Patient was recently hospitalized because of the stroke from a 12/14-12/16. Patient was discharged on aspirin and Plavix. She has left-sided weakness and left facial droop, which are most likely from previous stroke. -Continue aspirin and Plavix -Continue Lipitor  Hypertension:  -continue home medications: Hydralazine and verapamil  History of rheumatoid arthritis: -continue home low dose prednisone, 5 mg daily   DVT ppx: SQ Heparin      Code Status: Full code Family Communication: None at bed side.        Disposition Plan: Admit to inpatient   Date of Service 12/14/2014    Lorretta Harp [facility] Pager [phone]  If 7PM-7AM, please contact night-coverage [email or web address] Password [facility] 12/14/2014, 9:56 PM

## 2014-12-14 NOTE — Progress Notes (Signed)
Pt admitted from ED,alert to self only  But follows command,denies any pain,pt settled in bed, tele monitor put on pt, will continue to monitor, call light at bedside. Obasogie-Asidi, Alice Burnside Efe

## 2014-12-14 NOTE — Progress Notes (Signed)
ANTIBIOTIC CONSULT NOTE - INITIAL  Pharmacy Consult for Cefepime Indication: UTI  Allergies  Allergen Reactions  . Fentanyl Other (See Comments)    confusion    Patient Measurements: Height: 5\' 4"  (162.6 cm) Weight: 170 lb (77.111 kg) IBW/kg (Calculated) : 54.7  Vital Signs: Temp: 99.7 F (37.6 C) (01/12 1719) Temp Source: Oral (01/12 1719) BP: 169/89 mmHg (01/12 1719) Pulse Rate: 110 (01/12 1719) Intake/Output from previous day:   Intake/Output from this shift:    Labs: No results for input(s): WBC, HGB, PLT, LABCREA, CREATININE in the last 72 hours. Estimated Creatinine Clearance: 53.6 mL/min (by C-G formula based on Cr of 0.8). No results for input(s): VANCOTROUGH, VANCOPEAK, VANCORANDOM, GENTTROUGH, GENTPEAK, GENTRANDOM, TOBRATROUGH, TOBRAPEAK, TOBRARND, AMIKACINPEAK, AMIKACINTROU, AMIKACIN in the last 72 hours.   Microbiology: No results found for this or any previous visit (from the past 720 hour(s)).  Medical History: Past Medical History  Diagnosis Date  . Allergy   . Anxiety   . Insomnia   . Osteoarthritis   . Anemia   . Obesity   . Diastolic dysfunction   . CKD (chronic kidney disease)   . Mitral stenosis     with moderate MR on echo 4/14  . Aortic stenosis     mild by echo 4/14  . Aortic regurgitation     mild to moderate by echo 4/14  . HTN (hypertension)     Medications:   (Not in a hospital admission) Scheduled:  Infusions:   Assessment: 81yo female presents having not taken medication for UTI with Code Sepsis. Pharmacy is consulted to dose cefepime for UTI. Pt is slightly febrile to 99.8, WBC 9.4, sCr 0.85.  Goal of Therapy:  Resolution of infection  Plan:  Cefepime 1g IV q8h Follow up culture results, renal function, and clinical course  81yo. Arlean Hopping, PharmD Clinical Pharmacist Pager [phone] 12/14/2014,5:50 PM

## 2014-12-14 NOTE — ED Notes (Signed)
MD at bedside. Level 2 code sepsis initiated.

## 2014-12-14 NOTE — ED Notes (Signed)
Level 2 Sepsis called @ 1736

## 2014-12-14 NOTE — ED Notes (Signed)
Labs and blood cultures collected.

## 2014-12-14 NOTE — ED Provider Notes (Signed)
CSN: [id number]     Arrival date & time 12/14/14  1711 History   First MD Initiated Contact with Patient 12/14/14 1719     Chief Complaint  Patient presents with  . Urinary Tract Infection     (Consider location/radiation/quality/duration/timing/severity/associated sxs/prior Treatment) Patient is a 81 y.o. female presenting with altered mental status.  Altered Mental Status Presenting symptoms: behavior changes, combativeness, confusion and disorientation   Severity:  Severe Most recent episode: 1 week. Episode history:  Continuous Duration:  1 week Timing:  Constant Progression:  Waxing and waning Chronicity:  New Context comment:  Recent stroke, placed in rehab, family reports pt worsened since being in rehab Associated symptoms: agitation (combative), fever (noted today en route), slurred speech (since stroke per fam) and weakness (left sided (chronic since stroke per fam))   Associated symptoms: no abdominal pain, no difficulty breathing, no headaches (no current), no nausea, no rash and no vomiting     Past Medical History  Diagnosis Date  . Allergy   . Anxiety   . Insomnia   . Osteoarthritis   . Anemia   . Obesity   . Diastolic dysfunction   . CKD (chronic kidney disease)   . Mitral stenosis     with moderate MR on echo 4/14  . Aortic stenosis     mild by echo 4/14  . Aortic regurgitation     mild to moderate by echo 4/14  . HTN (hypertension)    Past Surgical History  Procedure Laterality Date  . Eye surgery    . Abdominal hysterectomy    . Appendectomy    . Cataract extraction, bilateral    . Tee without cardioversion N/A 04/13/2014    Procedure: TRANSESOPHAGEAL ECHOCARDIOGRAM (TEE);  Surgeon: Vesta Mixer, MD;  Location: [facility] ENDOSCOPY;  Service: Cardiovascular;  Laterality: N/A;   Family History  Problem Relation Age of Onset  . Hypertension Mother   . CVA Mother    History  Substance Use Topics  . Smoking status: Former Smoker -- 0.25 packs/day  for 1 years  . Smokeless tobacco: Never Used     Comment: smoked 1-3 cigs/day in school for about a year some days only  . Alcohol Use: No   OB History    No data available     Review of Systems  Constitutional: Positive for fever (noted today en route), activity change and appetite change.  HENT: Negative for sore throat.   Eyes: Negative for visual disturbance.  Respiratory: Negative for cough and shortness of breath.   Cardiovascular: Negative for chest pain.  Gastrointestinal: Negative for nausea, vomiting and abdominal pain.  Genitourinary: Negative for difficulty urinating.  Musculoskeletal: Negative for back pain and neck pain.  Skin: Negative for rash.  Neurological: Positive for weakness (left sided (chronic since stroke per fam)). Negative for syncope and headaches (no current).  Psychiatric/Behavioral: Positive for behavioral problems, confusion and agitation (combative).      Allergies  Fentanyl  Home Medications   Prior to Admission medications   Medication Sig Start Date End Date Taking? Authorizing Provider  acetaminophen (TYLENOL) 500 MG tablet Take 1,000 mg by mouth every 6 (six) hours as needed for mild pain or moderate pain.   Yes Historical Provider, MD  aspirin 81 MG chewable tablet Chew 4 tablets (324 mg total) by mouth daily. 11/17/14  Yes Rhetta Mura, MD  atorvastatin (LIPITOR) 80 MG tablet Take 1 tablet (80 mg total) by mouth daily at 6 PM. 11/17/14  Yes Jai-Gurmukh  Samtani, MD  bisacodyl (DULCOLAX) 5 MG EC tablet Take 5 mg by mouth as needed for severe constipation.   Yes Historical Provider, MD  carboxymethylcellulose (REFRESH) 1 % ophthalmic solution Apply 1 drop to eye at bedtime.   Yes Historical Provider, MD  cholecalciferol (VITAMIN D) 1000 UNITS tablet Take 1,000 Units by mouth daily.   Yes Historical Provider, MD  clopidogrel (PLAVIX) 75 MG tablet Take 1 tablet (75 mg total) by mouth daily. 11/17/14  Yes Rhetta Mura, MD   diclofenac sodium (VOLTAREN) 1 % GEL Apply 4 g topically 3 (three) times daily.   Yes Historical Provider, MD  doxazosin (CARDURA) 8 MG tablet Take 8 mg by mouth at bedtime.   Yes Historical Provider, MD  ENSURE (ENSURE) Take 237 mLs by mouth 3 (three) times daily between meals.   Yes Historical Provider, MD  ferrous sulfate 325 (65 FE) MG tablet Take 325 mg by mouth daily with breakfast.   Yes Historical Provider, MD  folic acid (FOLVITE) 1 MG tablet Take 1 mg by mouth daily.   Yes Historical Provider, MD  hydrALAZINE (APRESOLINE) 50 MG tablet Take 75 mg by mouth 3 (three) times daily. Take every 8 hours for hypertension - hold for SBP <110   Yes Historical Provider, MD  LORazepam (ATIVAN) 2 MG/ML concentrated solution Take 1 mg by mouth once.   Yes Historical Provider, MD  nitrofurantoin, macrocrystal-monohydrate, (MACROBID) 100 MG capsule Take 100 mg by mouth 2 (two) times daily. Started 12/13/14, for 7 days ending 12/19/14   Yes Historical Provider, MD  NON FORMULARY Inhale into the lungs at bedtime. Oxygen---Nightly   Yes Historical Provider, MD  Polyethyl Glycol-Propyl Glycol (SYSTANE) 0.4-0.3 % SOLN Apply 1 drop to eye 3 (three) times daily.   Yes Historical Provider, MD  polyethylene glycol (MIRALAX / GLYCOLAX) packet Take 17 g by mouth at bedtime.   Yes Historical Provider, MD  potassium chloride SA (K-DUR,KLOR-CON) 20 MEQ tablet Take 20 mEq by mouth once.   Yes Historical Provider, MD  predniSONE (DELTASONE) 5 MG tablet Take 5 mg by mouth daily with breakfast.   Yes Historical Provider, MD  traMADol (ULTRAM) 50 MG tablet Take 1 tablet (50 mg total) by mouth every 6 (six) hours as needed for moderate pain. 11/17/14  Yes Rhetta Mura, MD  verapamil (CALAN-SR) 120 MG CR tablet Take 1 tablet (120 mg total) by mouth daily. 05/24/14  Yes Georgann Housekeeper, MD  ALPRAZolam Prudy Feeler) 0.25 MG tablet Take 1 tablet (0.25 mg total) by mouth 2 (two) times daily as needed for anxiety. 05/24/14   Georgann Housekeeper, MD   BP 160/90 mmHg  Pulse 69  Temp(Src) 98.4 F (36.9 C) (Oral)  Resp 18  Ht 5\' 2"  (1.575 m)  Wt 161 lb 11.2 oz (73.347 kg)  BMI 29.57 kg/m2  SpO2 99% Physical Exam  Constitutional: She is oriented to person, place, and time. She appears well-developed and well-nourished. No distress.  HENT:  Head: Normocephalic and atraumatic.  Mouth/Throat: Oropharyngeal exudate (thrush) present.  Eyes: Conjunctivae and EOM are normal. Pupils are equal, round, and reactive to light.  Neck: Normal range of motion.  Cardiovascular: Normal rate, regular rhythm, normal heart sounds and intact distal pulses.  Exam reveals no gallop and no friction rub.   No murmur heard. Pulmonary/Chest: Effort normal and breath sounds normal. No respiratory distress. She has no wheezes. She has no rales.  Abdominal: Soft. She exhibits no distension. There is no tenderness. There is no guarding.  Musculoskeletal:  She exhibits no edema or tenderness.  Neurological: She is alert and oriented to person, place, and time. No sensory deficit. Cranial nerve deficit: left facial droop (from recent stroke per family.  Pt reports it is January 2016, does not know location, confused, asking unrelated questions, inappropriate responses at itmes Weakness left side (from recent stroke per family) Normal strength right   Skin: Skin is warm and dry. No rash noted. She is not diaphoretic. No erythema.  Nursing note and vitals reviewed.   ED Course  Procedures (including critical care time) Labs Review Labs Reviewed  COMPREHENSIVE METABOLIC PANEL - Abnormal; Notable for the following:    Potassium 3.3 (*)    BUN 5 (*)    Total Bilirubin 1.5 (*)    GFR calc non Af Amer 62 (*)    GFR calc Af Amer 71 (*)    All other components within normal limits  URINALYSIS, ROUTINE W REFLEX MICROSCOPIC - Abnormal; Notable for the following:    Ketones, ur 15 (*)    All other components within normal limits  D-DIMER, QUANTITATIVE -  Abnormal; Notable for the following:    D-Dimer, Quant 4.05 (*)    All other components within normal limits  BRAIN NATRIURETIC PEPTIDE - Abnormal; Notable for the following:    B Natriuretic Peptide 1132.1 (*)    All other components within normal limits  TROPONIN I - Abnormal; Notable for the following:    Troponin I 0.06 (*)    All other components within normal limits  TROPONIN I - Abnormal; Notable for the following:    Troponin I 0.08 (*)    All other components within normal limits  COMPREHENSIVE METABOLIC PANEL - Abnormal; Notable for the following:    Sodium 133 (*)    Potassium 3.2 (*)    Albumin 3.2 (*)    Total Bilirubin 1.7 (*)    GFR calc non Af Amer 76 (*)    GFR calc Af Amer 88 (*)    All other components within normal limits  PROTIME-INR - Abnormal; Notable for the following:    Prothrombin Time 15.6 (*)    All other components within normal limits  CULTURE, BLOOD (ROUTINE X 2)  CULTURE, BLOOD (ROUTINE X 2)  URINE CULTURE  CBC WITH DIFFERENTIAL  LIPASE, BLOOD  URINE RAPID DRUG SCREEN (HOSP PERFORMED)  CBC  TROPONIN I  AMMONIA  BLOOD GAS, ARTERIAL  I-STAT CG4 LACTIC ACID, ED  I-STAT CG4 LACTIC ACID, ED  I-STAT CG4 LACTIC ACID, ED    Imaging Review Ct Head Wo Contrast  12/14/2014   CLINICAL DATA:  Post stroke with LEFT-sided deficits, patient has been refusing medications for 1 week, now with decrease mental status an episodes of being combative, history hypertension, diastolic dysfunction, former smoker  EXAM: CT HEAD WITHOUT CONTRAST  TECHNIQUE: Contiguous axial images were obtained from the base of the skull through the vertex without intravenous contrast.  COMPARISON:  11/15/2014  FINDINGS: Mild generalized atrophy.  Normal ventricular morphology.  No midline shift or mass effect.  Small vessel chronic ischemic changes of deep cerebral white matter.  Old RIGHT frontal infarct.  No intracranial hemorrhage, mass lesion, or evidence acute infarction.  No  extra-axial fluid collections.  Atherosclerotic calcifications of the carotid siphons.  Hyperostosis frontalis interna.  No acute bone or sinus abnormalities.  IMPRESSION: Atrophy with small vessel chronic ischemic changes of deep cerebral white matter.  Old RIGHT frontal infarct.  No acute intracranial abnormalities.   Electronically Signed  By: Ulyses Southward M.D.   On: 12/14/2014 21:13   Ct Angio Chest Pe W/cm &/or Wo Cm  12/15/2014   CLINICAL DATA:  Acute onset of altered mental status and tachycardia. Elevated D-dimer. Initial encounter.  EXAM: CT ANGIOGRAPHY CHEST WITH CONTRAST  TECHNIQUE: Multidetector CT imaging of the chest was performed using the standard protocol during bolus administration of intravenous contrast. Multiplanar CT image reconstructions and MIPs were obtained to evaluate the vascular anatomy.  CONTRAST:  100 mL of Omnipaque 350 IV contrast  COMPARISON:  Chest radiograph performed 12/14/2014  FINDINGS: There is no evidence of pulmonary embolus.  Mild interstitial prominence may be chronic in nature. A few blebs are noted in the right lung base. The lungs are otherwise clear. There is no evidence of significant focal consolidation, pleural effusion or pneumothorax. No masses are identified; no abnormal focal contrast enhancement is seen.  Diffuse coronary artery calcifications are noted. The mediastinum is otherwise unremarkable. A tiny hiatal hernia is noted. Scattered calcification is seen along the aortic arch and proximal great vessels, with likely mild to moderate luminal narrowing at the origin of the left subclavian artery, and dense calcification at the origin of the right vertebral artery. No pericardial effusion is seen. No mediastinal lymphadenopathy is appreciated. No axillary lymphadenopathy is seen. The visualized portions of the thyroid gland are unremarkable in appearance.  The visualized portions of the liver and spleen are unremarkable. Nonspecific bilateral perinephric  stranding is noted. The visualized portions of the pancreas, gallbladder, stomach and adrenal glands are within normal limits. Reflux of contrast into the hepatic veins and IVC may reflect some degree of right heart insufficiency.  No acute osseous abnormalities are seen.  Review of the MIP images confirms the above findings.  IMPRESSION: 1. No evidence of pulmonary embolus. 2. Mild interstitial prominence may be chronic in nature. Few blebs at the right lung base. Lungs otherwise clear. 3. Diffuse coronary artery calcifications noted. 4. Tiny hiatal hernia seen. 5. Likely mild to moderate luminal narrowing at the origin of the left subclavian artery, and dense calcification at the origin of the right vertebral artery. 6. Reflux of contrast into the hepatic veins and IVC may reflect some degree of right heart insufficiency.   Electronically Signed   By: Roanna Raider M.D.   On: 12/15/2014 02:24   Dg Chest Port 1 View  12/14/2014   CLINICAL DATA:  Urinary tract infection, decreasing mental status for 1 week  EXAM: PORTABLE CHEST - 1 VIEW  COMPARISON:  11/16/2014  FINDINGS: Borderline cardiomegaly. No acute infiltrate or pulmonary edema. Atherosclerotic calcifications of thoracic aorta mild degenerative changes bilateral shoulders.  IMPRESSION: Borderline cardiomegaly.  No active disease.   Electronically Signed   By: Natasha Mead M.D.   On: 12/14/2014 19:33     EKG Interpretation None      MDM   Final diagnoses:  Disorientation  Fever, unspecified fever cause  Combative behavior  History of stroke  Tachycardia  81 year old female with history of chronic diastolic CHF, CKD, hypertension, valvular dysfunction, recent stroke now in rehab presents with concern of altered mental status, confusion, disorientation and combativeness since moving to rehab facility per family.  AMS may be secondary to recent stroke, however pt with tachycardia and febrile to 100.9 on arrival and code sepsis called.  Initial  hx included that pt not taking meds for 1 week including abx for UTI. Pt initially given cefepime until able to evaluate for HCAP vs urinary source.  Urinalysis and CXR do not show signs of infexn. Patient freely moving neck, 1wk of symptoms and doubt meningitis at this time.  Blood cx drawn and pending.  CT head without acute abnormality.  Hospitalist consulted for admission.     Alvira Monday, MD 12/15/14 1104  Audree Camel, MD 12/17/14 2141

## 2014-12-15 ENCOUNTER — Encounter (HOSPITAL_COMMUNITY): Payer: Self-pay | Admitting: *Deleted

## 2014-12-15 ENCOUNTER — Inpatient Hospital Stay (HOSPITAL_COMMUNITY): Payer: Medicare PPO

## 2014-12-15 DIAGNOSIS — I509 Heart failure, unspecified: Secondary | ICD-10-CM

## 2014-12-15 DIAGNOSIS — A419 Sepsis, unspecified organism: Secondary | ICD-10-CM

## 2014-12-15 LAB — TROPONIN I
TROPONIN I: 0.06 ng/mL — AB (ref <0.031)
Troponin I: 0.08 ng/mL — ABNORMAL HIGH (ref <0.031)
Troponin I: 0.06 ng/mL — ABNORMAL HIGH (ref <0.031)

## 2014-12-15 LAB — COMPREHENSIVE METABOLIC PANEL
ALK PHOS: 68 U/L (ref 39–117)
ALT: 20 U/L (ref 0–35)
AST: 33 U/L (ref 0–37)
Albumin: 3.2 g/dL — ABNORMAL LOW (ref 3.5–5.2)
Anion gap: 9 (ref 5–15)
BUN: 6 mg/dL (ref 6–23)
CALCIUM: 8.6 mg/dL (ref 8.4–10.5)
CO2: 23 mmol/L (ref 19–32)
Chloride: 101 mEq/L (ref 96–112)
Creatinine, Ser: 0.75 mg/dL (ref 0.50–1.10)
GFR calc non Af Amer: 76 mL/min — ABNORMAL LOW (ref >90)
GFR, EST AFRICAN AMERICAN: 88 mL/min — AB (ref >90)
GLUCOSE: 79 mg/dL (ref 70–99)
Potassium: 3.2 mmol/L — ABNORMAL LOW (ref 3.5–5.1)
Sodium: 133 mmol/L — ABNORMAL LOW (ref 135–145)
Total Bilirubin: 1.7 mg/dL — ABNORMAL HIGH (ref 0.3–1.2)
Total Protein: 6.7 g/dL (ref 6.0–8.3)

## 2014-12-15 LAB — PROTIME-INR
INR: 1.23 (ref 0.00–1.49)
PROTHROMBIN TIME: 15.6 s — AB (ref 11.6–15.2)

## 2014-12-15 LAB — BLOOD GAS, ARTERIAL
Acid-base deficit: 1.5 mmol/L (ref 0.0–2.0)
Bicarbonate: 22.3 mEq/L (ref 20.0–24.0)
Drawn by: 33100
FIO2: 0.21 %
O2 Saturation: 95.6 %
PATIENT TEMPERATURE: 98.2
TCO2: 23.3 mmol/L (ref 0–100)
pCO2 arterial: 34 mmHg — ABNORMAL LOW (ref 35.0–45.0)
pH, Arterial: 7.43 (ref 7.350–7.450)
pO2, Arterial: 76 mmHg — ABNORMAL LOW (ref 80.0–100.0)

## 2014-12-15 LAB — CBC
HCT: 37.4 % (ref 36.0–46.0)
Hemoglobin: 12.5 g/dL (ref 12.0–15.0)
MCH: 31.9 pg (ref 26.0–34.0)
MCHC: 33.4 g/dL (ref 30.0–36.0)
MCV: 95.4 fL (ref 78.0–100.0)
Platelets: 170 10*3/uL (ref 150–400)
RBC: 3.92 MIL/uL (ref 3.87–5.11)
RDW: 15.3 % (ref 11.5–15.5)
WBC: 9 10*3/uL (ref 4.0–10.5)

## 2014-12-15 LAB — RAPID URINE DRUG SCREEN, HOSP PERFORMED
AMPHETAMINES: NOT DETECTED
BARBITURATES: NOT DETECTED
BENZODIAZEPINES: NOT DETECTED
Cocaine: NOT DETECTED
Opiates: NOT DETECTED
TETRAHYDROCANNABINOL: NOT DETECTED

## 2014-12-15 LAB — URINE CULTURE
COLONY COUNT: NO GROWTH
CULTURE: NO GROWTH

## 2014-12-15 LAB — AMMONIA: Ammonia: 37 umol/L — ABNORMAL HIGH (ref 11–32)

## 2014-12-15 LAB — BRAIN NATRIURETIC PEPTIDE: B Natriuretic Peptide: 1132.1 pg/mL — ABNORMAL HIGH (ref 0.0–100.0)

## 2014-12-15 MED ORDER — DEXTROSE 5 % IV SOLN
1.0000 g | Freq: Two times a day (BID) | INTRAVENOUS | Status: DC
  Administered 2014-12-15 – 2014-12-19 (×9): 1 g via INTRAVENOUS
  Filled 2014-12-15 (×10): qty 1

## 2014-12-15 MED ORDER — IOHEXOL 350 MG/ML SOLN
100.0000 mL | Freq: Once | INTRAVENOUS | Status: AC | PRN
  Administered 2014-12-15: 100 mL via INTRAVENOUS

## 2014-12-15 MED ORDER — VANCOMYCIN HCL IN DEXTROSE 750-5 MG/150ML-% IV SOLN
750.0000 mg | Freq: Two times a day (BID) | INTRAVENOUS | Status: DC
  Administered 2014-12-15 – 2014-12-19 (×9): 750 mg via INTRAVENOUS
  Filled 2014-12-15 (×10): qty 150

## 2014-12-15 MED ORDER — GADOBENATE DIMEGLUMINE 529 MG/ML IV SOLN
15.0000 mL | Freq: Once | INTRAVENOUS | Status: AC | PRN
  Administered 2014-12-15: 15 mL via INTRAVENOUS

## 2014-12-15 NOTE — Progress Notes (Signed)
ANTIBIOTIC CONSULT NOTE - INITIAL  Pharmacy Consult for vancomycin + cefepime Indication: empiric for fevers, ?sepsis with history of endocarditis  Allergies  Allergen Reactions  . Fentanyl Other (See Comments)    confusion    Patient Measurements: Height: 5\' 2"  (157.5 cm) Weight: 161 lb 11.2 oz (73.347 kg) IBW/kg (Calculated) : 50.1  Vital Signs: Temp: 98.2 F (36.8 C) (01/13 0620) Temp Source: Axillary (01/13 0620) BP: 169/77 mmHg (01/13 0620) Pulse Rate: 102 (01/13 0620) Intake/Output from previous day: 01/12 0701 - 01/13 0700 In: 200 [IV Piggyback:200] Out: 100 [Urine:100] Intake/Output from this shift: Total I/O In: 586.3 [I.V.:586.3] Out: -   Labs:  Recent Labs  12/14/14 1727 12/15/14 0645  WBC 9.4 9.0  HGB 13.8 12.5  PLT 166 170  CREATININE 0.85 0.75   Estimated Creatinine Clearance: 50 mL/min (by C-G formula based on Cr of 0.75). No results for input(s): VANCOTROUGH, VANCOPEAK, VANCORANDOM, GENTTROUGH, GENTPEAK, GENTRANDOM, TOBRATROUGH, TOBRAPEAK, TOBRARND, AMIKACINPEAK, AMIKACINTROU, AMIKACIN in the last 72 hours.   Microbiology: No results found for this or any previous visit (from the past 720 hour(s)).  Medical History: Past Medical History  Diagnosis Date  . Allergy   . Anxiety   . Insomnia   . Osteoarthritis   . Anemia   . Obesity   . Diastolic dysfunction   . CKD (chronic kidney disease)   . Mitral stenosis     with moderate MR on echo 4/14  . Aortic stenosis     mild by echo 4/14  . Aortic regurgitation     mild to moderate by echo 4/14  . HTN (hypertension)     Medications:  Anti-infectives    Start     Dose/Rate Route Frequency Ordered Stop   12/15/14 1100  ceFEPIme (MAXIPIME) 1 g in dextrose 5 % 50 mL IVPB     1 g100 mL/hr over 30 Minutes Intravenous Every 12 hours 12/15/14 0955     12/15/14 1100  vancomycin (VANCOCIN) IVPB 750 mg/150 ml premix     750 mg150 mL/hr over 60 Minutes Intravenous Every 12 hours 12/15/14 0955     12/15/14 0200  ceFEPIme (MAXIPIME) 1 g in dextrose 5 % 50 mL IVPB  Status:  Discontinued     1 g100 mL/hr over 30 Minutes Intravenous Every 8 hours 12/14/14 1839 12/14/14 2334   12/14/14 1800  ceFEPIme (MAXIPIME) 1 g in dextrose 5 % 50 mL IVPB     1 g100 mL/hr over 30 Minutes Intravenous  Once 12/14/14 1755 12/14/14 1928     Assessment: 85 yof presented to the hospital with AMS. Initially started on cefepime but discontinued. Now restarting with cefepime + vancomycin empirically for fever and sepsis. Currently, pt has a tmax of 100.9 and WBC is WNL. Scr is also WNl at 0.75. Cultures are pending.   Vanc 1/13>> Cefepime 1/12>>  Goal of Therapy:  Vancomycin trough level 15-20 mcg/ml  Plan:  1. Cefepime 1gm IV Q12H 2. Vancomycin 750mg  IV Q12H 3. F/u renal fxn, C&S, clinical status and trough at [facility], 12/15/2014,9:56 AM

## 2014-12-15 NOTE — Evaluation (Signed)
Clinical/Bedside Swallow Evaluation Patient Details  Name: Monica Blankenship MRN: [id number] Date of Birth: July 20, 1929  Today's Date: 12/15/2014 Time: 7858-8502 SLP Time Calculation (min) (ACUTE ONLY): 26 min  Past Medical History:  Past Medical History  Diagnosis Date  . Allergy   . Anxiety   . Insomnia   . Osteoarthritis   . Anemia   . Obesity   . Diastolic dysfunction   . CKD (chronic kidney disease)   . Mitral stenosis     with moderate MR on echo 4/14  . Aortic stenosis     mild by echo 4/14  . Aortic regurgitation     mild to moderate by echo 4/14  . HTN (hypertension)    Past Surgical History:  Past Surgical History  Procedure Laterality Date  . Eye surgery    . Abdominal hysterectomy    . Appendectomy    . Cataract extraction, bilateral    . Tee without cardioversion N/A 04/13/2014    Procedure: TRANSESOPHAGEAL ECHOCARDIOGRAM (TEE);  Surgeon: Vesta Mixer, MD;  Location: [facility] ENDOSCOPY;  Service: Cardiovascular;  Laterality: N/A;   HPI:  Monica Blankenship is a 81 y.o. 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 CXR without signs of acute infection.   Assessment / Plan / Recommendation Clinical Impression  Pt required Mod-Max cues for sustained attention for continued intake with PO trials, however no overt signs of aspiration are observed across challenging, including the 3 oz water test. Pt does have left-sided facial weakness that results in mild anterior loss and slow bolus manipulation. Recommend Dys 3 diet and thin liquids. SLP to follow briefly for tolerance.    Aspiration Risk  Moderate    Diet Recommendation Dysphagia 3 (Mechanical Soft);Thin liquid   Liquid Administration via: Cup;Straw Medication Administration: Whole  meds with puree Supervision: Patient able to self feed;Full supervision/cueing for compensatory strategies Compensations: Slow rate;Small sips/bites Postural Changes and/or Swallow Maneuvers: Seated upright 90 degrees    Other  Recommendations Oral Care Recommendations: Oral care BID   Follow Up Recommendations   (tbd)    Frequency and Duration min 2x/week  1 week   Pertinent Vitals/Pain n/a    SLP Swallow Goals     Swallow Study Prior Functional Status       General Date of Onset: 12/15/14 HPI: Monica Blankenship is a 80 y.o. 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 CXR without signs of acute infection. Type of Study: Bedside swallow evaluation Previous Swallow Assessment: none in chart Diet Prior to this Study: NPO Temperature Spikes Noted: Yes Respiratory Status: Room air History of Recent Intubation: No Behavior/Cognition: Alert;Cooperative;Confused;Pleasant mood;Requires cueing;Decreased sustained attention Oral Cavity - Dentition: Dentures, top;Dentures, bottom Self-Feeding Abilities: Able to feed self Patient Positioning: Upright in bed Baseline Vocal Quality: Clear    Oral/Motor/Sensory Function Overall Oral Motor/Sensory Function: Impaired at baseline (left facial droop from previous CVA)   Ice Chips Ice chips: Not tested   Thin Liquid Thin Liquid: Impaired Presentation: Cup;Self Fed;Straw Oral Phase Impairments: Reduced labial seal Oral Phase Functional Implications: Left anterior spillage    Nectar Thick Nectar Thick Liquid: Not tested   Honey Thick Honey  Thick Liquid: Not tested   Puree Puree: Within functional limits Presentation: Self Fed;Spoon   Solid   GO    Solid: Impaired Presentation: Self Fed Oral Phase  Impairments: Impaired mastication       Monica Blankenship, M.A. CCC-SLP [phone]  Monica Blankenship 12/15/2014,4:05 PM

## 2014-12-15 NOTE — Progress Notes (Signed)
PROGRESS NOTE  Monica Blankenship VQQ:[id number] DOB: [birth year] DOA: 12/14/2014 PCP: Georgann Housekeeper, MD  HPI: Monica Blankenship is an 81 yo African American female with a PMH of HTN, recent stroke, subacute bacterial endocarditis, rheumatoid arthritis, mitral stenosis, aortic regurgitation, CKD.  Pt was in rehab facility post stroke (L sided) and had been refusing meds x 1 week, including antibiotic for UTI. She then came by EMS to the ED on 12/14/2014 with reports of fever, decreased mental status and combative episodes. Level 2 code sepsis initiated in ED and Cefepime was started. Later Cefepime discontinued d/t no clear signs of infection.  Subjective This morning, patient was lethargic and obtunded, improved later in the day and back to baseline per family   Assessment/Plan:  SIRS - febrile and tachycardic on admission, no clear source - improving - cultures obtained, empiric antibiotics will d/c in 48 h if cultures remain negative  Acute encephalopathy - unclear etiology, possibly infectious in origin with SIRS, she was recently diagnosed with a UTI and has been intermittently on antibiotics, refused in the past few days.  - continue to monitor - ABG reassuring this morning, ammonia normal at 37   Minimal troponin elevation - troponin flat x 3, no chest pain, likely demand due to #1  Elevated BNP - CTA chest showed "Reflux of contrast into the hepatic veins and IVC may reflect some degree of right heart insufficiency." Obtain 2D echo.   Recent history of MSSA bacteremia with endocarditis - diagnosed in May 2015, completed a course of IV antibiotics, seen by ID as an outpatient most recently in July 2015 - repeat blood cultures - repeat 2D echo  Hx of stroke - Patient was recently hospitalized because of the stroke from a 12/14-12/16. Patient was discharged on aspirin and Plavix. She has left-sided weakness and left facial droop, which are most likely from previous stroke. - repeat  MRI given acute encephalopathy  Hypertension - resume home medications  Hx of rheumatoid arthritis - continue home low dose prednisone, 5 mg daily, pending swallow study results  DVT Prophylaxis:  Heparin injection 5,000 unitis, [place], Q8H  Code Status: Full code Family Communication: None Disposition Plan: Inpatient.  Consultants:  None  Procedures:  2d echo pending  Antibiotics:  Cefepime 1/13 >>  Vancomycin 1/13 >>  Objective: Filed Vitals:   12/14/14 2335 12/15/14 0143 12/15/14 0620 12/15/14 1046  BP: 177/80 159/94 169/77 160/90  Pulse: 110 111 102 69  Temp: 99.1 F (37.3 C) 98.4 F (36.9 C) 98.2 F (36.8 C) 98.4 F (36.9 C)  TempSrc: Oral Oral Axillary Oral  Resp: 18 16 18 18   Height: 5\' 2"  (1.575 m)     Weight: 73.347 kg (161 lb 11.2 oz)     SpO2: 93% 96% 96% 99%    Intake/Output Summary (Last 24 hours) at 12/15/14 1453 Last data filed at 12/15/14 0800  Gross per 24 hour  Intake 786.25 ml  Output    100 ml  Net 686.25 ml   Filed Weights   12/14/14 1719 12/14/14 2335  Weight: 77.111 kg (170 lb) 73.347 kg (161 lb 11.2 oz)   Exam: General: Well developed, well nourished, appears stated age, obtunded. HEENT:  PERR, Anicteric Sclera.   Neck: Supple, no JVD. Cardiovascular: RRR, S1 S2 auscultated, no rubs, murmurs or gallops.  Pedal pulses 3+ b/l. Respiratory: Clear to auscultation bilaterally with equal chest rise.  Abdomen: Soft, nontender, nondistended. Extremities: warm dry without cyanosis clubbing or edema.  Neuro: Weak left hand  grip. Right hand grip 4/5.   Data Reviewed: Basic Metabolic Panel:  Recent Labs Lab 12/14/14 1727 12/15/14 0645  NA 141 133*  K 3.3* 3.2*  CL 106 101  CO2 22 23  GLUCOSE 96 79  BUN 5* 6  CREATININE 0.85 0.75  CALCIUM 9.6 8.6   Liver Function Tests:  Recent Labs Lab 12/14/14 1727 12/15/14 0645  AST 37 33  ALT 25 20  ALKPHOS 74 68  BILITOT 1.5* 1.7*  PROT 7.4 6.7  ALBUMIN 3.7 3.2*    Recent  Labs Lab 12/14/14 2252  LIPASE 36    Recent Labs Lab 12/15/14 1000  AMMONIA 37*   CBC:  Recent Labs Lab 12/14/14 1727 12/15/14 0645  WBC 9.4 9.0  NEUTROABS 6.2  --   HGB 13.8 12.5  HCT 40.7 37.4  MCV 97.4 95.4  PLT 166 170   Cardiac Enzymes:  Recent Labs Lab 12/15/14 0045 12/15/14 0645 12/15/14 1135  TROPONINI 0.06* 0.08* 0.06*   BNP (last 3 results)  Recent Labs  04/08/14 0807 04/09/14 0909  PROBNP 5143.0* 16685.0*    Studies: Ct Head Wo Contrast  12/14/2014   CLINICAL DATA:  Post stroke with LEFT-sided deficits, patient has been refusing medications for 1 week, now with decrease mental status an episodes of being combative, history hypertension, diastolic dysfunction, former smoker  EXAM: CT HEAD WITHOUT CONTRAST  TECHNIQUE: Contiguous axial images were obtained from the base of the skull through the vertex without intravenous contrast.  COMPARISON:  11/15/2014  FINDINGS: Mild generalized atrophy.  Normal ventricular morphology.  No midline shift or mass effect.  Small vessel chronic ischemic changes of deep cerebral white matter.  Old RIGHT frontal infarct.  No intracranial hemorrhage, mass lesion, or evidence acute infarction.  No extra-axial fluid collections.  Atherosclerotic calcifications of the carotid siphons.  Hyperostosis frontalis interna.  No acute bone or sinus abnormalities.  IMPRESSION: Atrophy with small vessel chronic ischemic changes of deep cerebral white matter.  Old RIGHT frontal infarct.  No acute intracranial abnormalities.   Electronically Signed   By: Ulyses Southward M.D.   On: 12/14/2014 21:13   Ct Angio Chest Pe W/cm &/or Wo Cm  12/15/2014   CLINICAL DATA:  Acute onset of altered mental status and tachycardia. Elevated D-dimer. Initial encounter.  EXAM: CT ANGIOGRAPHY CHEST WITH CONTRAST  TECHNIQUE: Multidetector CT imaging of the chest was performed using the standard protocol during bolus administration of intravenous contrast. Multiplanar CT  image reconstructions and MIPs were obtained to evaluate the vascular anatomy.  CONTRAST:  100 mL of Omnipaque 350 IV contrast  COMPARISON:  Chest radiograph performed 12/14/2014  FINDINGS: There is no evidence of pulmonary embolus.  Mild interstitial prominence may be chronic in nature. A few blebs are noted in the right lung base. The lungs are otherwise clear. There is no evidence of significant focal consolidation, pleural effusion or pneumothorax. No masses are identified; no abnormal focal contrast enhancement is seen.  Diffuse coronary artery calcifications are noted. The mediastinum is otherwise unremarkable. A tiny hiatal hernia is noted. Scattered calcification is seen along the aortic arch and proximal great vessels, with likely mild to moderate luminal narrowing at the origin of the left subclavian artery, and dense calcification at the origin of the right vertebral artery. No pericardial effusion is seen. No mediastinal lymphadenopathy is appreciated. No axillary lymphadenopathy is seen. The visualized portions of the thyroid gland are unremarkable in appearance.  The visualized portions of the liver and spleen are unremarkable.  Nonspecific bilateral perinephric stranding is noted. The visualized portions of the pancreas, gallbladder, stomach and adrenal glands are within normal limits. Reflux of contrast into the hepatic veins and IVC may reflect some degree of right heart insufficiency.  No acute osseous abnormalities are seen.  Review of the MIP images confirms the above findings.  IMPRESSION: 1. No evidence of pulmonary embolus. 2. Mild interstitial prominence may be chronic in nature. Few blebs at the right lung base. Lungs otherwise clear. 3. Diffuse coronary artery calcifications noted. 4. Tiny hiatal hernia seen. 5. Likely mild to moderate luminal narrowing at the origin of the left subclavian artery, and dense calcification at the origin of the right vertebral artery. 6. Reflux of contrast into  the hepatic veins and IVC may reflect some degree of right heart insufficiency.   Electronically Signed   By: Roanna Raider M.D.   On: 12/15/2014 02:24   Dg Chest Port 1 View  12/14/2014   CLINICAL DATA:  Urinary tract infection, decreasing mental status for 1 week  EXAM: PORTABLE CHEST - 1 VIEW  COMPARISON:  11/16/2014  FINDINGS: Borderline cardiomegaly. No acute infiltrate or pulmonary edema. Atherosclerotic calcifications of thoracic aorta mild degenerative changes bilateral shoulders.  IMPRESSION: Borderline cardiomegaly.  No active disease.   Electronically Signed   By: Natasha Mead M.D.   On: 12/14/2014 19:33    Scheduled Meds: . aspirin  324 mg Oral Daily  . atorvastatin  80 mg Oral q1800  . ceFEPime (MAXIPIME) IV  1 g Intravenous Q12H  . cholecalciferol  1,000 Units Oral Daily  . clopidogrel  75 mg Oral Daily  . diclofenac sodium  4 g Topical TID  . doxazosin  8 mg Oral QHS  . feeding supplement (ENSURE COMPLETE)  237 mL Oral TID BM  . ferrous sulfate  325 mg Oral Q breakfast  . folic acid  1 mg Oral Daily  . heparin  5,000 Units Subcutaneous 3 times per day  . hydrALAZINE  75 mg Oral TID  . predniSONE  5 mg Oral Q breakfast  . sodium chloride  3 mL Intravenous Q12H  . vancomycin  750 mg Intravenous Q12H  . verapamil  120 mg Oral Daily   Continuous Infusions: . sodium chloride 75 mL/hr at 12/15/14 0011    Principal Problem:   Encephalopathy acute Active Problems:   Mitral stenosis   Aortic stenosis   Aortic regurgitation   Chronic diastolic CHF (congestive heart failure)   Pulmonary HTN   HTN (hypertension)   Stroke   Hypertension   Acute encephalopathy   Hypokalemia    Jamison Neighbor, PA-S  [facility] Pager [phone]. If 7PM-7AM, please contact night-coverage at [email or web address], password [facility] 12/15/2014, 2:53 PM  LOS: 1 day     Costin M. Elvera Lennox, MD [facility] [phone]

## 2014-12-15 NOTE — Progress Notes (Signed)
*  PRELIMINARY RESULTS* Echocardiogram 2D Echocardiogram has been performed.  Monica Blankenship 12/15/2014, 3:36 PM

## 2014-12-15 NOTE — Progress Notes (Signed)
OT Cancellation Note  Patient Details Name: RAISA DITTO MRN: [id number] DOB: 1929-10-02   Cancelled Treatment:    Reason Eval/Treat Not Completed: Medical issues which prohibited therapy. OT to hold therapy at this time as pt has strict bed rest orders. OT will see pt as available once activity orders are updated.   Nena Jordan M   Carney Living, OTR/L Occupational Therapist [phone] (pager)  12/15/2014, 11:59 AM

## 2014-12-15 NOTE — Clinical Social Work Psychosocial (Signed)
Clinical Social Work Department BRIEF PSYCHOSOCIAL ASSESSMENT 12/15/2014  Patient:  Monica Blankenship, Monica Blankenship     Account Number:  [id number]     Admit date:  12/14/2014  Clinical Social Worker:  Bo Mcclintock  Date/Time:  12/15/2014 10:48 AM  Referred by:  RN  Date Referred:  12/15/2014 Referred for  SNF Placement   Other Referral:   Interview type:  Family Other interview type:   Pt is only orient to self; Pt's daughter/POA Monica Blankenship [phone]    PSYCHOSOCIAL DATA Living Status:  FACILITY Admitted from facility:  [facility] Level of care:  Skilled Nursing Facility Primary support name:  Monica Blankenship Primary support relationship to patient:  CHILD, ADULT Degree of support available:   Strong Support    CURRENT CONCERNS Current Concerns  Post-Acute Placement   Other Concerns:    SOCIAL WORK ASSESSMENT / PLAN CSW spoke with pt's daughter Monica Blankenship. CSW introduced self and purpose of the call. [name] reported the pt transition to [facility] on 11/17/2014 for rehab. [name] reported being unsure if the family would like the pt to return back. [name] expressed interest in [facility] and [facility].  CSW and the pt discussed the geographic area in which the pt is interest in receiving rehab.  [name] acknowledged the pt could not return home due to no one being there to care for the pt. CSW provided the [facility] with contact information for further questions. CSW will continue to follow this pt and assist with discharge as needed.   Assessment/plan status:  Psychosocial Support/Ongoing Assessment of Needs Other assessment/ plan:   Information/referral to community resources:    PATIENT'S/FAMILY'S RESPONSE TO PLAN OF CARE: [name] appeared to be distress regarding the care the pt received at [facility]. [name] reported not understanding why the staff did not realize the pt was confused. [name] is receptive of the pt's plan to continue rehab at a SNF.    Joetta Delprado,  MSW, LCSWA [phone]

## 2014-12-15 NOTE — Progress Notes (Signed)
PT Cancellation Note  Patient Details Name: KIMRA KANTOR MRN: [id number] DOB: 1929-09-06   Cancelled Treatment:    Reason Eval/Treat Not Completed: Patient not medically ready Holding PT evaluation as pt on strict bedrest. Will await increase in activity orders prior to initiation of PT eval.   Folan, Benjimin Hadden A 12/15/2014, 8:50 AM  Alvie Heidelberg, PT, DPT [phone]

## 2014-12-16 LAB — COMPREHENSIVE METABOLIC PANEL
ALT: 20 U/L (ref 0–35)
AST: 33 U/L (ref 0–37)
Albumin: 3.1 g/dL — ABNORMAL LOW (ref 3.5–5.2)
Alkaline Phosphatase: 68 U/L (ref 39–117)
Anion gap: 8 (ref 5–15)
BILIRUBIN TOTAL: 1.9 mg/dL — AB (ref 0.3–1.2)
BUN: 8 mg/dL (ref 6–23)
CHLORIDE: 107 meq/L (ref 96–112)
CO2: 24 mmol/L (ref 19–32)
Calcium: 9.2 mg/dL (ref 8.4–10.5)
Creatinine, Ser: 0.75 mg/dL (ref 0.50–1.10)
GFR calc Af Amer: 88 mL/min — ABNORMAL LOW (ref >90)
GFR, EST NON AFRICAN AMERICAN: 76 mL/min — AB (ref >90)
Glucose, Bld: 94 mg/dL (ref 70–99)
POTASSIUM: 3.5 mmol/L (ref 3.5–5.1)
SODIUM: 139 mmol/L (ref 135–145)
Total Protein: 6.7 g/dL (ref 6.0–8.3)

## 2014-12-16 NOTE — Consult Note (Signed)
Referring Physician: Elvera Lennox    Chief Complaint: stroke  HPI:                                                                                                                                         Monica Blankenship is an 81 y.o. 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 At that time no carotid artery stenosis was found and no emboli was seen on TTE.  Patient was placed on ASA and Plavix at that time.  In the past several days, patient became confused. She refused taking her medications in last week, including antibiotics for UTI and ASA/Plavix. 1/2 blood cultures has grown gram postive cocci--awaiting final result. Due to confusion and left sided weakness MRI was obtained showing further evolution of the right watershed infarct. Neurology was consulted.   Date last known well: Unable to determine Time last known well: Unable to determine tPA Given: No: recent stroke and no LSW  Past Medical History  Diagnosis Date  . Allergy   . Anxiety   . Insomnia   . Osteoarthritis   . Anemia   . Obesity   . Diastolic dysfunction   . CKD (chronic kidney disease)   . Mitral stenosis     with moderate MR on echo 4/14  . Aortic stenosis     mild by echo 4/14  . Aortic regurgitation     mild to moderate by echo 4/14  . HTN (hypertension)     Past Surgical History  Procedure Laterality Date  . Eye surgery    . Abdominal hysterectomy    . Appendectomy    . Cataract extraction, bilateral    . Tee without cardioversion N/A 04/13/2014    Procedure: TRANSESOPHAGEAL ECHOCARDIOGRAM (TEE);  Surgeon: Vesta Mixer, MD;  Location: [facility] ENDOSCOPY;  Service: Cardiovascular;  Laterality: N/A;    Family History  Problem Relation Age of Onset  . Hypertension Mother   . CVA Mother    Social History:  reports that she has quit smoking. She has never used smokeless tobacco. She reports that she does not  drink alcohol or use illicit drugs.  Allergies:  Allergies  Allergen Reactions  . Fentanyl Other (See Comments)    confusion    Medications:  Prior to Admission:  Prescriptions prior to admission  Medication Sig Dispense Refill Last Dose  . acetaminophen (TYLENOL) 500 MG tablet Take 1,000 mg by mouth every 6 (six) hours as needed for mild pain or moderate pain.   Past Week at Unknown time  . aspirin 81 MG chewable tablet Chew 4 tablets (324 mg total) by mouth daily.   12/14/2014 at Unknown time  . atorvastatin (LIPITOR) 80 MG tablet Take 1 tablet (80 mg total) by mouth daily at 6 PM.   12/13/2014 at Unknown time  . bisacodyl (DULCOLAX) 5 MG EC tablet Take 5 mg by mouth as needed for severe constipation.   Past Month at Unknown time  . carboxymethylcellulose (REFRESH) 1 % ophthalmic solution Apply 1 drop to eye at bedtime.   12/13/2014 at Unknown time  . cholecalciferol (VITAMIN D) 1000 UNITS tablet Take 1,000 Units by mouth daily.   12/14/2014 at Unknown time  . clopidogrel (PLAVIX) 75 MG tablet Take 1 tablet (75 mg total) by mouth daily. 30 tablet 0 12/14/2014 at Unknown time  . diclofenac sodium (VOLTAREN) 1 % GEL Apply 4 g topically 3 (three) times daily.   12/14/2014 at Unknown time  . doxazosin (CARDURA) 8 MG tablet Take 8 mg by mouth at bedtime.   12/13/2014 at Unknown time  . ENSURE (ENSURE) Take 237 mLs by mouth 3 (three) times daily between meals.   12/14/2014 at Unknown time  . ferrous sulfate 325 (65 FE) MG tablet Take 325 mg by mouth daily with breakfast.   12/14/2014 at Unknown time  . folic acid (FOLVITE) 1 MG tablet Take 1 mg by mouth daily.   12/14/2014 at Unknown time  . hydrALAZINE (APRESOLINE) 50 MG tablet Take 75 mg by mouth 3 (three) times daily. Take every 8 hours for hypertension - hold for SBP <110   12/14/2014 at Unknown time  . LORazepam (ATIVAN) 2 MG/ML  concentrated solution Take 1 mg by mouth once.   12/14/2014 at Unknown time  . nitrofurantoin, macrocrystal-monohydrate, (MACROBID) 100 MG capsule Take 100 mg by mouth 2 (two) times daily. Started 12/13/14, for 7 days ending 12/19/14   12/14/2014 at Unknown time  . NON FORMULARY Inhale into the lungs at bedtime. Oxygen---Nightly   12/13/2014 at Unknown time  . Polyethyl Glycol-Propyl Glycol (SYSTANE) 0.4-0.3 % SOLN Apply 1 drop to eye 3 (three) times daily.   12/14/2014 at Unknown time  . polyethylene glycol (MIRALAX / GLYCOLAX) packet Take 17 g by mouth at bedtime.   12/13/2014 at Unknown time  . potassium chloride SA (K-DUR,KLOR-CON) 20 MEQ tablet Take 20 mEq by mouth once.   Past Week at Unknown time  . predniSONE (DELTASONE) 5 MG tablet Take 5 mg by mouth daily with breakfast.   12/14/2014 at Unknown time  . traMADol (ULTRAM) 50 MG tablet Take 1 tablet (50 mg total) by mouth every 6 (six) hours as needed for moderate pain. 30 tablet 0 Past Month at Unknown time  . verapamil (CALAN-SR) 120 MG CR tablet Take 1 tablet (120 mg total) by mouth daily. 30 tablet 3 12/14/2014 at Unknown time  . ALPRAZolam (XANAX) 0.25 MG tablet Take 1 tablet (0.25 mg total) by mouth 2 (two) times daily as needed for anxiety. 30 tablet 0 unknown   Scheduled: . aspirin  324 mg Oral Daily  . atorvastatin  80 mg Oral q1800  . ceFEPime (MAXIPIME) IV  1 g Intravenous Q12H  . cholecalciferol  1,000 Units Oral Daily  . clopidogrel  75 mg Oral Daily  . diclofenac sodium  4 g Topical TID  . doxazosin  8 mg Oral QHS  . feeding supplement (ENSURE COMPLETE)  237 mL Oral TID BM  . ferrous sulfate  325 mg Oral Q breakfast  . folic acid  1 mg Oral Daily  . heparin  5,000 Units Subcutaneous 3 times per day  . hydrALAZINE  75 mg Oral TID  . predniSONE  5 mg Oral Q breakfast  . sodium chloride  3 mL Intravenous Q12H  . vancomycin  750 mg Intravenous Q12H  . verapamil  120 mg Oral Daily    ROS:                                                                                                                                        History obtained from the patient and husband  General ROS: negative for - chills, fatigue, fever, night sweats, weight gain or weight loss Psychological ROS: negative for - behavioral disorder, hallucinations, memory difficulties, mood swings or suicidal ideation Ophthalmic ROS: negative for - blurry vision, double vision, eye pain or loss of vision ENT ROS: negative for - epistaxis, nasal discharge, oral lesions, sore throat, tinnitus or vertigo Allergy and Immunology ROS: negative for - hives or itchy/watery eyes Hematological and Lymphatic ROS: negative for - bleeding problems, bruising or swollen lymph nodes Endocrine ROS: negative for - galactorrhea, hair pattern changes, polydipsia/polyuria or temperature intolerance Respiratory ROS: negative for - cough, hemoptysis, shortness of breath or wheezing Cardiovascular ROS: negative for - chest pain, dyspnea on exertion, edema or irregular heartbeat Gastrointestinal ROS: negative for - abdominal pain, diarrhea, hematemesis, nausea/vomiting or stool incontinence Genito-Urinary ROS: negative for - dysuria, hematuria, incontinence or urinary frequency/urgency Musculoskeletal ROS: negative for - joint swelling or muscular weakness Neurological ROS: as noted in HPI Dermatological ROS: negative for rash and skin lesion changes  Neurologic Examination:                                                                                                      Blood pressure 156/56, pulse 75, temperature 99.4 F (37.4 C), temperature source Oral, resp. rate 18, height 5\' 2"  (1.575 m), weight 75.569 kg (166 lb 9.6 oz), SpO2 96 %.  HEENT-  Normocephalic, no lesions, without obvious abnormality.  Normal external eye and conjunctiva.  Normal TM's bilaterally.  Normal auditory canals and external ears. Normal external nose, mucus membranes and septum.  Normal  pharynx. Cardiovascular- regular rate  and rhythm, pulses palpable throughout   Lungs- no tachypnea, retractions or cyanosis Abdomen- normal findings: bowel sounds normal Extremities- no edema Lymph-no adenopathy palpable Musculoskeletal-no muscular tenderness noted Skin-warm and dry, no hyperpigmentation, vitiligo, or suspicious lesions  Neurological Examination Mental Status: Alert, not oriented to place or date.  Speech dysarthric without evidence of aphasia.  Able to follow 3 step commands without difficulty. Cranial Nerves: II: Discs flat bilaterally; Visual fields grossly normal, pupils equal, round, reactive to light and accommodation III,IV, VI: ptosis not present, extra-ocular motions intact bilaterally at rest right eye is esotropic V,VII: smile asymmetric on the left, facial light touch sensation normal bilaterally VIII: hearing normal bilaterally IX,X: gag reflex present XI: bilateral shoulder shrug XII: midline tongue extension Motor: Right : Upper extremity   5/5    Left:     Upper extremity   0/5  Lower extremity   5/5     Lower extremity   3/5 Tone and bulk:normal tone throughout; no atrophy noted Sensory: Pinprick and light touch intact throughout, bilaterally Deep Tendon Reflexes: 2+ and symmetric throughout UE, 1+ in KJ and no AJ Plantars: Right: downgoing   Left: equivical Cerebellar: normal finger-to-nose on the right and normal heel-to-shin test on the right Gait: not tested due to patient safety .        Lab Results: Basic Metabolic Panel:  Recent Labs Lab 12/14/14 1727 12/15/14 0645 12/16/14 0312  NA 141 133* 139  K 3.3* 3.2* 3.5  CL 106 101 107  CO2 22 23 24   GLUCOSE 96 79 94  BUN 5* 6 8  CREATININE 0.85 0.75 0.75  CALCIUM 9.6 8.6 9.2    Liver Function Tests:  Recent Labs Lab 12/14/14 1727 12/15/14 0645 12/16/14 0312  AST 37 33 33  ALT 25 20 20   ALKPHOS 74 68 68  BILITOT 1.5* 1.7* 1.9*  PROT 7.4 6.7 6.7  ALBUMIN 3.7 3.2* 3.1*     Recent Labs Lab 12/14/14 2252  LIPASE 36    Recent Labs Lab 12/15/14 1000  AMMONIA 37*    CBC:  Recent Labs Lab 12/14/14 1727 12/15/14 0645  WBC 9.4 9.0  NEUTROABS 6.2  --   HGB 13.8 12.5  HCT 40.7 37.4  MCV 97.4 95.4  PLT 166 170    Cardiac Enzymes:  Recent Labs Lab 12/15/14 0045 12/15/14 0645 12/15/14 1135  TROPONINI 0.06* 0.08* 0.06*    Lipid Panel: No results for input(s): CHOL, TRIG, HDL, CHOLHDL, VLDL, LDLCALC in the last 168 hours.  CBG: No results for input(s): GLUCAP in the last 168 hours.  Microbiology: Results for orders placed or performed during the hospital encounter of 12/14/14  Blood Culture (routine x 2)     Status: None (Preliminary result)   Collection Time: 12/14/14  5:35 PM  Result Value Ref Range Status   Specimen Description BLOOD LEFT ARM  Final   Special Requests BOTTLES DRAWN AEROBIC AND ANAEROBIC 5CC  Final   Culture   Final    GRAM POSITIVE COCCI IN CLUSTERS Performed at [facility]    Report Status PENDING  Incomplete  Blood Culture (routine x 2)     Status: None (Preliminary result)   Collection Time: 12/14/14  5:50 PM  Result Value Ref Range Status   Specimen Description BLOOD RIGHT ARM  Final   Special Requests BOTTLES DRAWN AEROBIC AND ANAEROBIC 5CC  Final   Culture   Final           BLOOD CULTURE RECEIVED  NO GROWTH TO DATE CULTURE WILL BE HELD FOR 5 DAYS BEFORE ISSUING A FINAL NEGATIVE REPORT Performed at [facility]    Report Status PENDING  Incomplete  Urine culture     Status: None   Collection Time: 12/14/14  6:24 PM  Result Value Ref Range Status   Specimen Description URINE, CATHETERIZED  Final   Special Requests NONE  Final   Colony Count NO GROWTH Performed at [facility]   Final   Culture NO GROWTH Performed at [facility]   Final   Report Status 12/15/2014 FINAL  Final    Coagulation Studies:  Recent Labs  12/15/14 0215  LABPROT 15.6*  INR 1.23     Imaging: Ct Head Wo Contrast  12/14/2014   CLINICAL DATA:  Post stroke with LEFT-sided deficits, patient has been refusing medications for 1 week, now with decrease mental status an episodes of being combative, history hypertension, diastolic dysfunction, former smoker  EXAM: CT HEAD WITHOUT CONTRAST  TECHNIQUE: Contiguous axial images were obtained from the base of the skull through the vertex without intravenous contrast.  COMPARISON:  11/15/2014  FINDINGS: Mild generalized atrophy.  Normal ventricular morphology.  No midline shift or mass effect.  Small vessel chronic ischemic changes of deep cerebral white matter.  Old RIGHT frontal infarct.  No intracranial hemorrhage, mass lesion, or evidence acute infarction.  No extra-axial fluid collections.  Atherosclerotic calcifications of the carotid siphons.  Hyperostosis frontalis interna.  No acute bone or sinus abnormalities.  IMPRESSION: Atrophy with small vessel chronic ischemic changes of deep cerebral white matter.  Old RIGHT frontal infarct.  No acute intracranial abnormalities.   Electronically Signed   By: Ulyses Southward M.D.   On: 12/14/2014 21:13   Ct Angio Chest Pe W/cm &/or Wo Cm  12/15/2014   CLINICAL DATA:  Acute onset of altered mental status and tachycardia. Elevated D-dimer. Initial encounter.  EXAM: CT ANGIOGRAPHY CHEST WITH CONTRAST  TECHNIQUE: Multidetector CT imaging of the chest was performed using the standard protocol during bolus administration of intravenous contrast. Multiplanar CT image reconstructions and MIPs were obtained to evaluate the vascular anatomy.  CONTRAST:  100 mL of Omnipaque 350 IV contrast  COMPARISON:  Chest radiograph performed 12/14/2014  FINDINGS: There is no evidence of pulmonary embolus.  Mild interstitial prominence may be chronic in nature. A few blebs are noted in the right lung base. The lungs are otherwise clear. There is no evidence of significant focal consolidation, pleural effusion or pneumothorax. No  masses are identified; no abnormal focal contrast enhancement is seen.  Diffuse coronary artery calcifications are noted. The mediastinum is otherwise unremarkable. A tiny hiatal hernia is noted. Scattered calcification is seen along the aortic arch and proximal great vessels, with likely mild to moderate luminal narrowing at the origin of the left subclavian artery, and dense calcification at the origin of the right vertebral artery. No pericardial effusion is seen. No mediastinal lymphadenopathy is appreciated. No axillary lymphadenopathy is seen. The visualized portions of the thyroid gland are unremarkable in appearance.  The visualized portions of the liver and spleen are unremarkable. Nonspecific bilateral perinephric stranding is noted. The visualized portions of the pancreas, gallbladder, stomach and adrenal glands are within normal limits. Reflux of contrast into the hepatic veins and IVC may reflect some degree of right heart insufficiency.  No acute osseous abnormalities are seen.  Review of the MIP images confirms the above findings.  IMPRESSION: 1. No evidence of pulmonary embolus. 2. Mild  interstitial prominence may be chronic in nature. Few blebs at the right lung base. Lungs otherwise clear. 3. Diffuse coronary artery calcifications noted. 4. Tiny hiatal hernia seen. 5. Likely mild to moderate luminal narrowing at the origin of the left subclavian artery, and dense calcification at the origin of the right vertebral artery. 6. Reflux of contrast into the hepatic veins and IVC may reflect some degree of right heart insufficiency.   Electronically Signed   By: Roanna Raider M.D.   On: 12/15/2014 02:24   Mr Laqueta Jean ZG Contrast  12/15/2014   CLINICAL DATA:  Hypertension. Recent stroke. Bacterial endocarditis. Fever and worsened mental status.  EXAM: MRI HEAD WITHOUT AND WITH CONTRAST  TECHNIQUE: Multiplanar, multiecho pulse sequences of the brain and surrounding structures were obtained without and  with intravenous contrast.  CONTRAST:  3mL MULTIHANCE GADOBENATE DIMEGLUMINE 529 MG/ML IV SOLN  COMPARISON:  Head CT 12/14/2014.  MRI 11/15/2014.  FINDINGS: There has been progression of acute watershed infarctions within the right hemisphere primarily affecting the deep white matter. This is most notable in the frontal parietal deep white matter. No acute infarction affecting the brainstem, cerebellum or left cerebral hemisphere. No hemorrhage or change in midline position.  Background pattern of extensive chronic small vessel disease remains evident affecting both cerebral hemispheres. Old bilateral frontal cortical and subcortical infarction appears the same.  No evidence of neoplastic mass lesion, hydrocephalus or extra-axial collection. After contrast administration, no abnormal enhancement occurs.  IMPRESSION: Worsening/acute extension of watershed infarctions in the deep white matter of the right cerebral hemisphere when compared to the previous study.   Electronically Signed   By: Paulina Fusi M.D.   On: 12/15/2014 18:32   Dg Chest Port 1 View  12/14/2014   CLINICAL DATA:  Urinary tract infection, decreasing mental status for 1 week  EXAM: PORTABLE CHEST - 1 VIEW  COMPARISON:  11/16/2014  FINDINGS: Borderline cardiomegaly. No acute infiltrate or pulmonary edema. Atherosclerotic calcifications of thoracic aorta mild degenerative changes bilateral shoulders.  IMPRESSION: Borderline cardiomegaly.  No active disease.   Electronically Signed   By: Natasha Mead M.D.   On: 12/14/2014 19:33   11/16/2014  Carotid doppler:  - The vertebral arteries appear patent with antegrade flow. - Findings consistent with 1-39 percent stenosis involving the right internal carotid artery and the left internal carotid artery.  Echo:  - Normal LV function with elevated LV filling pressure; severe LAE; mild RAE; mildly reduced RV function; mild AS; moderate MS (by gradient) with moderate MR; mild TR with  severely elevated pulmonary pressures.   HgbA1c 5.7  LDL 70,  12/15/2013--poor study        Assessment and plan discussed with with attending physician and they are in agreement.    Felicie Morn PA-C [facility] [phone]  12/16/2014, 12:24 PM   Assessment: 81 y.o. female presenting with UTI and further expansion of her right MCA watershed infarct.  Patient was recently seen in hospital and had full stroke work-up.  Source of infarct likely secondary to the high grade stenosis of her right M1 branch of the MCA. Currently patient is on ASA and Plavix. Stroke Risk Factors - hypertension  Recommend:  1) Continue ASA and Plavix 2) Treat underlying UTI and infections 3) PT/OT  I personally participated in this patient's evaluation and management, including formulating the above clinical impression and management recommendations.  Venetia Maxon M.D. [facility] [phone]

## 2014-12-16 NOTE — Progress Notes (Signed)
INITIAL NUTRITION ASSESSMENT  DOCUMENTATION CODES Per approved criteria  -Obesity Unspecified   INTERVENTION: Continue Ensure Complete po TID, each supplement provides 350 kcal and 13 grams of protein RD to monitor for PO adequacy   NUTRITION DIAGNOSIS: Predicted suboptimal energy intake related to acute encephalopathy and decreased appetite as evidenced by pt's chart and recent 5% weight loss.    Goal: Pt to meet >/= 90% of their estimated nutrition needs   Monitor:  PO intake, weight trend, labs, I/O's  Reason for Assessment: Malnutrition Screening Tool, score of 3  81 y.o. female  Admitting Dx: Encephalopathy acute  ASSESSMENT: 81 yo [place] American female with a PMH of HTN, recent stroke, subacute bacterial endocarditis, rheumatoid arthritis, mitral stenosis, aortic regurgitation, CKD. Pt was in rehab facility post stroke (L sided) and had been refusing meds x 1 week, including antibiotic for UTI. She then came by EMS to the ED on 12/14/2014 with reports of fever, decreased mental status and combative episodes.  At time of visit pt opened her eyes, did not respond, and went back to sleep. Per Malnutrition Screening Tool in pt chart, pt reported eating poorly PTA due to decreased appetite. Weight history shows pt has lost 9 lbs in the past month (5.2% weight loss) but, she appears well nourished and does have a history of CHF. Per PTA medication list, pt was taking Ensure TID between meals; Ensure Complete TID currently ordered and being given.  Labs reviewed.   Height: Ht Readings from Last 1 Encounters:  12/14/14 5\' 2"  (1.575 m)    Weight: Wt Readings from Last 1 Encounters:  12/16/14 166 lb 9.6 oz (75.569 kg)    Ideal Body Weight: 110 lbs  % Ideal Body Weight: 151%  Wt Readings from Last 10 Encounters:  12/16/14 166 lb 9.6 oz (75.569 kg)  11/15/14 175 lb (79.379 kg)  11/14/14 178 lb 12.8 oz (81.103 kg)  07/20/14 175 lb 1.9 oz (79.434 kg)  06/30/14 177 lb  (80.287 kg)  05/24/14 169 lb 8 oz (76.885 kg)  04/27/14 179 lb (81.194 kg)  04/13/14 179 lb 4.8 oz (81.33 kg)  03/11/14 186 lb (84.369 kg)  03/01/14 186 lb (84.369 kg)    Usual Body Weight: unknown  % Usual Body Weight: NA  BMI:  Body mass index is 30.46 kg/(m^2). (Obese)  Estimated Nutritional Needs: Kcal: 1500-1650 Protein: 80-90 grams Fluid: >/= 1.7 L/day  Skin: intact  Diet Order: DIET DYS 3, thin liquids  EDUCATION NEEDS: -No education needs identified at this time   Intake/Output Summary (Last 24 hours) at 12/16/14 1433 Last data filed at 12/16/14 0500  Gross per 24 hour  Intake      0 ml  Output      2 ml  Net     -2 ml    Last BM: 1/13   Labs:   Recent Labs Lab 12/14/14 1727 12/15/14 0645 12/16/14 0312  NA 141 133* 139  K 3.3* 3.2* 3.5  CL 106 101 107  CO2 22 23 24   BUN 5* 6 8  CREATININE 0.85 0.75 0.75  CALCIUM 9.6 8.6 9.2  GLUCOSE 96 79 94    CBG (last 3)  No results for input(s): GLUCAP in the last 72 hours.  Scheduled Meds: . aspirin  324 mg Oral Daily  . atorvastatin  80 mg Oral q1800  . ceFEPime (MAXIPIME) IV  1 g Intravenous Q12H  . cholecalciferol  1,000 Units Oral Daily  . clopidogrel  75 mg Oral  Daily  . diclofenac sodium  4 g Topical TID  . doxazosin  8 mg Oral QHS  . feeding supplement (ENSURE COMPLETE)  237 mL Oral TID BM  . ferrous sulfate  325 mg Oral Q breakfast  . folic acid  1 mg Oral Daily  . heparin  5,000 Units Subcutaneous 3 times per day  . hydrALAZINE  75 mg Oral TID  . predniSONE  5 mg Oral Q breakfast  . sodium chloride  3 mL Intravenous Q12H  . vancomycin  750 mg Intravenous Q12H  . verapamil  120 mg Oral Daily    Continuous Infusions:   Past Medical History  Diagnosis Date  . Allergy   . Anxiety   . Insomnia   . Osteoarthritis   . Anemia   . Obesity   . Diastolic dysfunction   . CKD (chronic kidney disease)   . Mitral stenosis     with moderate MR on echo 4/14  . Aortic stenosis     mild by  echo 4/14  . Aortic regurgitation     mild to moderate by echo 4/14  . HTN (hypertension)     Past Surgical History  Procedure Laterality Date  . Eye surgery    . Abdominal hysterectomy    . Appendectomy    . Cataract extraction, bilateral    . Tee without cardioversion N/A 04/13/2014    Procedure: TRANSESOPHAGEAL ECHOCARDIOGRAM (TEE);  Surgeon: Vesta Mixer, MD;  Location: [facility] ENDOSCOPY;  Service: Cardiovascular;  Laterality: N/A;    Ian Malkin RD, LDN Inpatient Clinical Dietitian Pager: [phone] After Hours Pager: [phone]

## 2014-12-16 NOTE — Evaluation (Signed)
Occupational Therapy Evaluation Patient Details Name: Monica Blankenship MRN: [id number] DOB: Aug 27, 1929 Today's Date: 12/16/2014    History of Present Illness Monica Blankenship is a 81 y.o. 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 Patient was recently hospitalized from 12/14 to 12/16 because of stroke. She was discharged to the nursing home for rehabilitation. In the past several days, patient became confused. She refused taking her medications in last week, including antibiotics for UTI. When I evaluated patient in emergency room, patient looks comfortable in bed. She is alert, but not oriented 3. Patient seems not to have any pain in anywhere. She answered some questions, states that she does not have chest pain, abdominal pain, shortness of breath.    Clinical Impression   Patient admitted with above. Patient was living at a SNF PTA and according to daughter required up to total assist with ADLs, working with therapy to regain independence. Patient currently functioning at a total assist level. Please see OT problem list below. Feel patient will benefit from acute OT to increase overall independence in the areas of ADLs & functional mobility and in order to safely discharge to SNF for continued rehab.      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 Patient refused to follow commands stating "why do you need to see this". OT to follow-up and see patient for session tomorrow for better understanding of deficits.      Vision Additional Comments: unable to fully assess secondary to patient non compliant           Pertinent Vitals/Pain Pain Assessment: No/denies pain   Hand Dominance Left   Extremity/Trunk Assessment Upper Extremity Assessment Upper Extremity Assessment: LUE deficits/detail LUE Deficits / Details: Patient with flaccid LUE and when attempting to perform ROM evaluation, patient refused and non compliant    Lower Extremity Assessment Lower Extremity Assessment: Defer to PT evaluation   Cervical / Trunk Assessment Cervical / Trunk Assessment: Kyphotic;Other exceptions Cervical / Trunk Exceptions: tends to lean to the L, keeps head turned to the R   Communication Communication Communication: Expressive difficulties   Cognition Arousal/Alertness: Lethargic Behavior During Therapy: Agitated Overall Cognitive Status: Impaired/Different from baseline Area of Impairment: Attention;Memory;Safety/judgement;Following commands;Awareness;Problem solving;Orientation Orientation Level: Disoriented to;Time;Situation Current Attention Level: Sustained Memory: Decreased short-term memory Following Commands: Follows one step commands inconsistently Safety/Judgement: Decreased awareness of safety;Decreased awareness of deficits Awareness: Intellectual Problem Solving: Slow processing;Requires verbal  cues;Requires tactile cues General Comments: Patient with  increased aggitation telling therapist "get out of my house" and when therapist performing visual and LUE evaluation patient stated "why are you doing that? I'm fine!" and "stop touching me". Therapist notified RN and NT of this behavior.               Home Living Family/patient expects to be discharged to:: Skilled nursing facility Living Arrangements: Spouse/significant other;Children (son)   Type of Home: House   Additional Comments: Pt lives with 71 yo husband and disabled son.  Lives With: Spouse;Son;Daughter    Prior Functioning/Environment Level of Independence: Needs assistance  Gait / Transfers Assistance Needed: Pt using [facility]. ADL's / Homemaking Assistance Needed: Per daughter, pt has been mainly (I) with ADls prior to symptoms.  Does some cooking. Helps care for husband.   Comments: Pt from [facility]  Per daughter, she was mostly total A for OT (ADLs and bathing and dressing) and had started walking in the // bars with therapy and was able to get on exercise bike with assist.     OT Diagnosis: Generalized weakness;Hemiplegia dominant side;Cognitive deficits;Disturbance of vision;Altered mental status   OT Problem List: Decreased strength;Decreased range of motion;Decreased activity tolerance;Impaired balance (sitting and/or standing);Impaired vision/perception;Decreased coordination;Decreased cognition;Decreased safety awareness;Decreased knowledge of use of DME or AE;Decreased knowledge of precautions;Impaired UE functional use;Pain;Increased edema   OT Treatment/Interventions: Self-care/ADL training;Therapeutic exercise;Energy conservation;DME and/or AE instruction;Neuromuscular education;Therapeutic activities;Cognitive remediation/compensation;Visual/perceptual remediation/compensation;Patient/family education;Balance training    OT Goals(Current goals can be found in the care plan  section) Acute Rehab OT Goals Patient Stated Goal: not stated OT Goal Formulation: With patient Time For Goal Achievement: 12/30/14 Potential to Achieve Goals: Good ADL Goals Pt Will Perform Eating: with set-up Pt Will Perform Grooming: with set-up;sitting Pt Will Perform Upper Body Bathing: with min assist;sitting Pt Will Perform Lower Body Bathing: sit to/from stand;with mod assist;with adaptive equipment Pt Will Perform Upper Body Dressing: with min assist;sitting Pt Will Perform Lower Body Dressing: with mod assist;sit to/from stand;with adaptive equipment Pt Will Transfer to Toilet: with min assist;stand pivot transfer Pt/caregiver will Perform Home Exercise Program: Increased ROM;Increased strength;Left upper extremity;With Supervision;With written HEP provided  OT Frequency: Min 2X/week   Barriers to D/C: Unable to assess          End of Session Nurse Communication: Need for lift equipment (patient's aggitation and non compliance)  Activity Tolerance: Treatment limited secondary to agitation Patient left: in chair;with call bell/phone within reach   Time: 1500-1515 OT Time Calculation (min): 15 min Charges:  OT Evaluation $Initial OT Evaluation Tier I: 1 Procedure  Joby Richart , MS, OTR/L, CLT Pager: [phone]  12/16/2014, 3:44 PM

## 2014-12-16 NOTE — Progress Notes (Signed)
Speech Language Pathology Treatment: Dysphagia  Patient Details Name: Monica Blankenship MRN: [id number] DOB: 24-Apr-1929 Today's Date: 12/16/2014 Time: 1204-1223 SLP Time Calculation (min) (ACUTE ONLY): 19 min  Assessment / Plan / Recommendation Clinical Impression  Pt seen for f/u dysphagia treatment. SLP provided skilled observation of Dys 3 textures (pt declined thin liquids) with Min-Mod cues for sustained attention to oral phase of swallow. Pt without overt signs of aspiration observed. SLP will continue to follow briefly for diet tolerance.   HPI HPI: Monica Blankenship is a 81 y.o. 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 CXR without signs of acute infection.   Pertinent Vitals Pain Assessment: No/denies pain Faces Pain Scale: Hurts little more Pain Location: L knee and wrist Pain Descriptors / Indicators: Aching Pain Intervention(s): Monitored during session;Repositioned  SLP Plan  Continue with current plan of care    Recommendations Diet recommendations: Dysphagia 3 (mechanical soft);Thin liquid Liquids provided via: Cup Medication Administration: Whole meds with puree Supervision: Patient able to self feed;Full supervision/cueing for compensatory strategies Compensations: Slow rate;Small sips/bites Postural Changes and/or Swallow Maneuvers: Seated upright 90 degrees              Oral Care Recommendations: Oral care BID Follow up Recommendations: None (for swallowing) Plan: Continue with current plan of care    GO     Monica Blankenship, M.A. CCC-SLP [phone]  Monica Blankenship 12/16/2014, 12:34 PM

## 2014-12-16 NOTE — Evaluation (Signed)
Physical Therapy Evaluation Patient Details Name: Monica Blankenship MRN: [id number] DOB: 03-Dec-1929 Today's Date: 12/16/2014   History of Present Illness  Monica Blankenship is a 81 y.o. 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 Patient was recently hospitalized from 12/14 to 12/16 because of stroke. She was discharged to the nursing home for rehabilitation. In the past several days, patient became confused. She refused taking her medications in last week, including antibiotics for UTI. When I evaluated patient in emergency room, patient looks comfortable in bed. She is alert, but not oriented 3. Patient seems not to have any pain in anywhere. She answered some questions, states that she does not have chest pain, abdominal pain, shortness of breath.   Clinical Impression  Pt demonstrates increased weakness based on reports from daughter and seeing functional status in [facility] from [facility] approx 1 month ago.  She requires max A for partial stand with increased anxiety during mobility.  Seemed to have some pusher tendencies to the L during sitting and in standing.  Pt unable to pivot to recliner, therefore provided max A for squat pivot transfer to recliner.  Note minimal movement in LLE and no active movement in LUE.  Pt with decreased attention during session.  Pt will benefit from acute services to address deficits. PT recommends return to SNF for continued rehab upon D/C from hospital.      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  She did initiate movement of LEs and was able to reach across for bed rail.   Transfers Overall transfer level: Needs assistance Equipment used: None Transfers: Sit to/from [employer] Sit to Stand: Max assist   Squat pivot transfers: Max assist     General transfer comment: Pt requires max A for forward weight shift and trunk lean, for increased hip extension and trunk elevation.  pt unable to get into fully upright position.  Seemed to have some pusher tendencies as she kept trying to move RLE in standing.  Pt with increased L knee pain and unable to pivot over to chair in standing, therefore performed squat pivot transfer to recliner at max a level with continued facilitation for forward weight shift.   Ambulation/Gait Ambulation/Gait assistance:  (did not attempt due to safety and no +2)              Stairs            Wheelchair Mobility    Modified Rankin (Stroke Patients Only)       Balance Overall balance assessment: Needs assistance Sitting-balance support: Single extremity supported;Feet supported Sitting balance-Leahy Scale: Poor Sitting balance - Comments: Pt requires mod to close S for static sitting due to LOB to the L.  Postural control: Left  lateral lean Standing balance support: Single extremity supported;During functional activity Standing balance-Leahy Scale: Zero Standing balance comment: Pt requires max A for partial standing during session.                              Pertinent Vitals/Pain Pain Assessment: Faces Faces Pain Scale: Hurts little more Pain Location: L knee and wrist Pain  Descriptors / Indicators: Aching Pain Intervention(s): Monitored during session;Repositioned    Home Living Family/patient expects to be discharged to:: Skilled nursing facility                      Prior Function           Comments: Pt from [facility]  Per daughter, she was mostly total A for OT (ADLs and bathing and dressing) and had started walking in the // bars with therapy and was able to get on exercise bike with assist.      Hand Dominance   Dominant Hand: Left    Extremity/Trunk Assessment               Lower Extremity Assessment: LLE deficits/detail   LLE Deficits / Details: pt with grossly 2- to 2+/5 movement in LLE during functional tasks  Cervical / Trunk Assessment: Kyphotic;Other exceptions  Communication      Cognition Arousal/Alertness: Awake/alert Behavior During Therapy: Anxious Overall Cognitive Status: Impaired/Different from baseline Area of Impairment: Following commands;Attention;Problem solving;Memory;Orientation Orientation Level: Disoriented to;Situation Current Attention Level: Sustained Memory: Decreased short-term memory Following Commands: Follows one step commands with increased time;Follows multi-step commands inconsistently     Problem Solving: Slow processing;Requires verbal cues;Requires tactile cues      General Comments      Exercises        Assessment/Plan    PT Assessment Patient needs continued PT services  PT Diagnosis Generalized weakness;Hemiplegia dominant side;Difficulty walking   PT Problem List Decreased strength;Decreased range of motion;Decreased activity tolerance;Decreased balance;Decreased mobility;Decreased coordination;Decreased cognition;Decreased knowledge of use of DME;Decreased safety awareness;Decreased knowledge of precautions;Obesity;Pain  PT Treatment Interventions DME instruction;Gait training;Functional mobility training;Therapeutic activities;Therapeutic  exercise;Balance training;Neuromuscular re-education;Cognitive remediation;Patient/family education;Wheelchair mobility training   PT Goals (Current goals can be found in the Care Plan section) Acute Rehab PT Goals Patient Stated Goal: not stated PT Goal Formulation: With patient/family Time For Goal Achievement: 12/23/14 Potential to Achieve Goals: Fair    Frequency Min 3X/week   Barriers to discharge   Pt to return to SNF.    Co-evaluation               End of Session Equipment Utilized During Treatment: Gait belt Activity Tolerance: Patient limited by fatigue Patient left: in chair;with call bell/phone within reach;with chair alarm set;with family/visitor present Nurse Communication: Mobility status;Need for lift equipment         Time: 1027-1100 PT Time Calculation (min) (ACUTE ONLY): 33 min   Charges:   PT Evaluation $Initial PT Evaluation Tier I: 1 Procedure PT Treatments $Therapeutic Activity: 23-37 mins   PT G Codes:        Vista Deck 12/16/2014, 11:13 AM

## 2014-12-16 NOTE — Progress Notes (Signed)
Pt positive for two blood cultures. MD notified.

## 2014-12-16 NOTE — Progress Notes (Signed)
PROGRESS NOTE  Monica Blankenship NWG:[id number] DOB: [birth year] DOA: 12/14/2014 PCP: Georgann Housekeeper, MD  HPI: Monica Blankenship is an 81 yo African American female with a PMH of HTN, recent stroke, subacute bacterial endocarditis, rheumatoid arthritis, mitral stenosis, aortic regurgitation, CKD. Pt was in rehab facility post stroke (L sided) and had been refusing meds x 1 week, including antibiotic for UTI. She then came by EMS to the ED on 12/14/2014 with reports of fever, decreased mental status and combative episodes. Level 2 code sepsis initiated in ED and Cefepime was started. Later Cefepime discontinued d/t no clear signs of infection.  Yesterday 12/15/2014 in AM patient appeared obtunded. Vancomycin and Cefepime were started for presumed UTI (despite negative UA, d/t unclear nursing home history) and possible recurrence of bacterial endocarditis (last episode 2015). Yesterday PM patient was awake and talking.  Subjective: This AM, daughter is at bedside and pt is awake and talking, about same as yesterday PM. Her speech makes sense but it is clear she sometimes knows she is here at the hospital and sometimes thinks she is at [employer] or at home.   Daughter [name] requests we only share medical information with herself, her sisters [name] and [name], and her father Monica Blankenship. [name] expresses concern over mother's personality changes and combative behavior that started in the rehab center 2 weeks ago.  Assessment/Plan: SIRS - febrile and tachycardic on admission, no clear source - improving - urine culture negative - blood cultures with GPC in clusters 1/2 bottles  Acute encephalopathy - unclear etiology, possibly infectious in origin with SIRS, she was recently diagnosed with a UTI and has been intermittently on antibiotics, refused in the past few days.  - continue to monitor - ABG reassuring yesterday  Minimal troponin elevation - troponin flat x 3, no  chest pain, likely demand due to #1  Elevated BNP - 2D echo reassuring, normal EF, overall stable echo when compared to previous  Recent history of MSSA bacteremia with endocarditis - diagnosed in May 2015, completed a course of IV antibiotics, seen by ID as an outpatient most recently in July 2015 - repeat 2D echo yesterday showed no vegetation on valves - Blood cultures preliminary result show gram positive cocci in clusters; waiting for species identification - continue Vancomycin and Cefepime  Hx of stroke - Patient was recently hospitalized because of the stroke from a 12/14-12/16. Patient was discharged on aspirin and Plavix. She has left-sided weakness and left facial droop, which are most likely from previous stroke. - repeat MRI showed "Worsening/acute extension of watershed infarctions in the deep white matter of the right cerebral hemisphere when compared to the previous study" (Dec. 2015). - consulted neurology today, appreciate input  Hypertension - resume home medications  Hx of rheumatoid arthritis - continue home low dose prednisone, 5 mg daily  DVT Prophylaxis:  Heparin injection 5,000 units [place] Q8H  Code Status: Full Family Communication: Talked to daughter [name]. She requests we not share medical information with anyone other than herself, her sisters [name] and [name], and her father Chayse Gracey.  Disposition Plan: Inpatient  Consultants:  Neurology (Dr. Roseanne Reno)  Procedures:  None   Antibiotics:  Cefepime 1/13>>  Vancomycin 1/13>>  Objective: Filed Vitals:   12/15/14 2132 12/16/14 0244 12/16/14 0500 12/16/14 0542  BP: 162/54 159/62  172/64  Pulse: 73 79  78  Temp: 98.3 F (36.8 C) 98.6 F (37 C)  98.2 F (36.8 C)  TempSrc: Oral Oral  Oral  Resp: 18 19  18   Height:      Weight:   75.569 kg (166 lb 9.6 oz)   SpO2: 98% 98%  98%    Intake/Output Summary (Last 24 hours) at 12/16/14 0935 Last data filed at 12/16/14 0500   Gross per 24 hour  Intake      0 ml  Output      2 ml  Net     -2 ml   Filed Weights   12/14/14 1719 12/14/14 2335 12/16/14 0500  Weight: 77.111 kg (170 lb) 73.347 kg (161 lb 11.2 oz) 75.569 kg (166 lb 9.6 oz)   Exam: General: Well developed, well nourished, NAD, appears stated age  HEENT:  PERR, AnicteRic Sclera, MMM.  Neck: Supple, no JVD. Cardiovascular: RRR, S1 S2 auscultated, no rubs, murmurs or gallops.  Pedal pulses 3+. Respiratory: Clear to auscultation bilaterally with equal chest rise  Abdomen: Soft, nontender, nondistended. Extremities: warm dry without cyanosis clubbing or edema.  Neuro: Answers place questions correctly but speech is sometimes confused. Right upper and lower extremity strength 5/5. Weak left hand grip. Left foot 4/5. Can wiggle toes.   Psych: Agreeable but sometimes confused about where she is.  Data Reviewed: Basic Metabolic Panel:  Recent Labs Lab 12/14/14 1727 12/15/14 0645 12/16/14 0312  NA 141 133* 139  K 3.3* 3.2* 3.5  CL 106 101 107  CO2 22 23 24   GLUCOSE 96 79 94  BUN 5* 6 8  CREATININE 0.85 0.75 0.75  CALCIUM 9.6 8.6 9.2   Liver Function Tests:  Recent Labs Lab 12/14/14 1727 12/15/14 0645 12/16/14 0312  AST 37 33 33  ALT 25 20 20   ALKPHOS 74 68 68  BILITOT 1.5* 1.7* 1.9*  PROT 7.4 6.7 6.7  ALBUMIN 3.7 3.2* 3.1*    Recent Labs Lab 12/14/14 2252  LIPASE 36    Recent Labs Lab 12/15/14 1000  AMMONIA 37*   CBC:  Recent Labs Lab 12/14/14 1727 12/15/14 0645  WBC 9.4 9.0  NEUTROABS 6.2  --   HGB 13.8 12.5  HCT 40.7 37.4  MCV 97.4 95.4  PLT 166 170   Cardiac Enzymes:  Recent Labs Lab 12/15/14 0045 12/15/14 0645 12/15/14 1135  TROPONINI 0.06* 0.08* 0.06*   BNP (last 3 results)  Recent Labs  04/08/14 0807 04/09/14 0909  PROBNP 5143.0* 16685.0*   CBG: No results for input(s): GLUCAP in the last 168 hours.  Recent Results (from the past 240 hour(s))  Blood Culture (routine x 2)     Status: None  (Preliminary result)   Collection Time: 12/14/14  5:35 PM  Result Value Ref Range Status   Specimen Description BLOOD LEFT ARM  Final   Special Requests BOTTLES DRAWN AEROBIC AND ANAEROBIC 5CC  Final   Culture   Final    GRAM POSITIVE COCCI IN CLUSTERS Performed at 06/08/14    Report Status PENDING  Incomplete  Blood Culture (routine x 2)     Status: None (Preliminary result)   Collection Time: 12/14/14  5:50 PM  Result Value Ref Range Status   Specimen Description BLOOD RIGHT ARM  Final   Special Requests BOTTLES DRAWN AEROBIC AND ANAEROBIC 5CC  Final   Culture   Final           BLOOD CULTURE RECEIVED NO GROWTH TO DATE CULTURE WILL BE HELD FOR 5 DAYS BEFORE ISSUING A FINAL NEGATIVE REPORT Performed at 02/12/15    Report Status PENDING  Incomplete  Urine culture     Status: None   Collection Time: 12/14/14  6:24 PM  Result Value Ref Range Status   Specimen Description URINE, CATHETERIZED  Final   Special Requests NONE  Final   Colony Count NO GROWTH Performed at [facility]   Final   Culture NO GROWTH Performed at [facility]   Final   Report Status 12/15/2014 FINAL  Final     Studies: Ct Head Wo Contrast  12/14/2014   CLINICAL DATA:  Post stroke with LEFT-sided deficits, patient has been refusing medications for 1 week, now with decrease mental status an episodes of being combative, history hypertension, diastolic dysfunction, former smoker  EXAM: CT HEAD WITHOUT CONTRAST  TECHNIQUE: Contiguous axial images were obtained from the base of the skull through the vertex without intravenous contrast.  COMPARISON:  11/15/2014  FINDINGS: Mild generalized atrophy.  Normal ventricular morphology.  No midline shift or mass effect.  Small vessel chronic ischemic changes of deep cerebral white matter.  Old RIGHT frontal infarct.  No intracranial hemorrhage, mass lesion, or evidence acute infarction.  No extra-axial fluid collections.  Atherosclerotic  calcifications of the carotid siphons.  Hyperostosis frontalis interna.  No acute bone or sinus abnormalities.  IMPRESSION: Atrophy with small vessel chronic ischemic changes of deep cerebral white matter.  Old RIGHT frontal infarct.  No acute intracranial abnormalities.   Electronically Signed   By: Ulyses Southward M.D.   On: 12/14/2014 21:13   Ct Angio Chest Pe W/cm &/or Wo Cm  12/15/2014   CLINICAL DATA:  Acute onset of altered mental status and tachycardia. Elevated D-dimer. Initial encounter.  EXAM: CT ANGIOGRAPHY CHEST WITH CONTRAST  TECHNIQUE: Multidetector CT imaging of the chest was performed using the standard protocol during bolus administration of intravenous contrast. Multiplanar CT image reconstructions and MIPs were obtained to evaluate the vascular anatomy.  CONTRAST:  100 mL of Omnipaque 350 IV contrast  COMPARISON:  Chest radiograph performed 12/14/2014  FINDINGS: There is no evidence of pulmonary embolus.  Mild interstitial prominence may be chronic in nature. A few blebs are noted in the right lung base. The lungs are otherwise clear. There is no evidence of significant focal consolidation, pleural effusion or pneumothorax. No masses are identified; no abnormal focal contrast enhancement is seen.  Diffuse coronary artery calcifications are noted. The mediastinum is otherwise unremarkable. A tiny hiatal hernia is noted. Scattered calcification is seen along the aortic arch and proximal great vessels, with likely mild to moderate luminal narrowing at the origin of the left subclavian artery, and dense calcification at the origin of the right vertebral artery. No pericardial effusion is seen. No mediastinal lymphadenopathy is appreciated. No axillary lymphadenopathy is seen. The visualized portions of the thyroid gland are unremarkable in appearance.  The visualized portions of the liver and spleen are unremarkable. Nonspecific bilateral perinephric stranding is noted. The visualized portions of the  pancreas, gallbladder, stomach and adrenal glands are within normal limits. Reflux of contrast into the hepatic veins and IVC may reflect some degree of right heart insufficiency.  No acute osseous abnormalities are seen.  Review of the MIP images confirms the above findings.  IMPRESSION: 1. No evidence of pulmonary embolus. 2. Mild interstitial prominence may be chronic in nature. Few blebs at the right lung base. Lungs otherwise clear. 3. Diffuse coronary artery calcifications noted. 4. Tiny hiatal hernia seen. 5. Likely mild to moderate luminal narrowing at the origin of the left subclavian artery, and dense  calcification at the origin of the right vertebral artery. 6. Reflux of contrast into the hepatic veins and IVC may reflect some degree of right heart insufficiency.   Electronically Signed   By: Roanna Raider M.D.   On: 12/15/2014 02:24   Mr Laqueta Jean AS Contrast  12/15/2014   CLINICAL DATA:  Hypertension. Recent stroke. Bacterial endocarditis. Fever and worsened mental status.  EXAM: MRI HEAD WITHOUT AND WITH CONTRAST  TECHNIQUE: Multiplanar, multiecho pulse sequences of the brain and surrounding structures were obtained without and with intravenous contrast.  CONTRAST:  45mL MULTIHANCE GADOBENATE DIMEGLUMINE 529 MG/ML IV SOLN  COMPARISON:  Head CT 12/14/2014.  MRI 11/15/2014.  FINDINGS: There has been progression of acute watershed infarctions within the right hemisphere primarily affecting the deep white matter. This is most notable in the frontal parietal deep white matter. No acute infarction affecting the brainstem, cerebellum or left cerebral hemisphere. No hemorrhage or change in midline position.  Background pattern of extensive chronic small vessel disease remains evident affecting both cerebral hemispheres. Old bilateral frontal cortical and subcortical infarction appears the same.  No evidence of neoplastic mass lesion, hydrocephalus or extra-axial collection. After contrast administration, no  abnormal enhancement occurs.  IMPRESSION: Worsening/acute extension of watershed infarctions in the deep white matter of the right cerebral hemisphere when compared to the previous study.   Electronically Signed   By: Paulina Fusi M.D.   On: 12/15/2014 18:32   Dg Chest Port 1 View  12/14/2014   CLINICAL DATA:  Urinary tract infection, decreasing mental status for 1 week  EXAM: PORTABLE CHEST - 1 VIEW  COMPARISON:  11/16/2014  FINDINGS: Borderline cardiomegaly. No acute infiltrate or pulmonary edema. Atherosclerotic calcifications of thoracic aorta mild degenerative changes bilateral shoulders.  IMPRESSION: Borderline cardiomegaly.  No active disease.   Electronically Signed   By: Natasha Mead M.D.   On: 12/14/2014 19:33    Scheduled Meds: . aspirin  324 mg Oral Daily  . atorvastatin  80 mg Oral q1800  . ceFEPime (MAXIPIME) IV  1 g Intravenous Q12H  . cholecalciferol  1,000 Units Oral Daily  . clopidogrel  75 mg Oral Daily  . diclofenac sodium  4 g Topical TID  . doxazosin  8 mg Oral QHS  . feeding supplement (ENSURE COMPLETE)  237 mL Oral TID BM  . ferrous sulfate  325 mg Oral Q breakfast  . folic acid  1 mg Oral Daily  . heparin  5,000 Units Subcutaneous 3 times per day  . hydrALAZINE  75 mg Oral TID  . predniSONE  5 mg Oral Q breakfast  . sodium chloride  3 mL Intravenous Q12H  . vancomycin  750 mg Intravenous Q12H  . verapamil  120 mg Oral Daily   Continuous Infusions: . sodium chloride 75 mL/hr at 12/16/14 0848    Principal Problem:   Encephalopathy acute Active Problems:   Mitral stenosis   Aortic stenosis   Aortic regurgitation   Chronic diastolic CHF (congestive heart failure)   Pulmonary HTN   HTN (hypertension)   Stroke   Hypertension   Acute encephalopathy   Hypokalemia    Jamison Neighbor, PA-S  [facility] Pager [phone]. If 7PM-7AM, please contact night-coverage at [email or web address], password [facility] 12/16/2014, 9:35 AM  LOS: 2 days     Jaxxon Naeem M.  Elvera Lennox, MD [facility] [phone]

## 2014-12-16 NOTE — Progress Notes (Signed)
Chaplain initiated visit with pt and family. Chaplain introduced herself and her services. Chaplain offered prayer and emotional support. Chaplain will continue to follow.   12/16/14 0900  Clinical Encounter Type  Visited With Patient and family together  Visit Type Initial;Spiritual support  Spiritual Encounters  Spiritual Needs Emotional;Prayer  Stress Factors  Patient Stress Factors Health changes  Family Stress Factors Health changes  Monica Blankenship, Loa Socks 12/16/2014 9:43 AM

## 2014-12-17 LAB — COMPREHENSIVE METABOLIC PANEL
ALK PHOS: 63 U/L (ref 39–117)
ALT: 18 U/L (ref 0–35)
AST: 26 U/L (ref 0–37)
Albumin: 3.4 g/dL — ABNORMAL LOW (ref 3.5–5.2)
Anion gap: 13 (ref 5–15)
BUN: 6 mg/dL (ref 6–23)
CHLORIDE: 106 meq/L (ref 96–112)
CO2: 21 mmol/L (ref 19–32)
CREATININE: 0.71 mg/dL (ref 0.50–1.10)
Calcium: 9.3 mg/dL (ref 8.4–10.5)
GFR calc Af Amer: 90 mL/min — ABNORMAL LOW (ref >90)
GFR calc non Af Amer: 78 mL/min — ABNORMAL LOW (ref >90)
Glucose, Bld: 116 mg/dL — ABNORMAL HIGH (ref 70–99)
Potassium: 3 mmol/L — ABNORMAL LOW (ref 3.5–5.1)
Sodium: 140 mmol/L (ref 135–145)
Total Bilirubin: 1.5 mg/dL — ABNORMAL HIGH (ref 0.3–1.2)
Total Protein: 6.9 g/dL (ref 6.0–8.3)

## 2014-12-17 LAB — GLUCOSE, CAPILLARY: Glucose-Capillary: 108 mg/dL — ABNORMAL HIGH (ref 70–99)

## 2014-12-17 LAB — CULTURE, BLOOD (ROUTINE X 2)

## 2014-12-17 MED ORDER — POTASSIUM CHLORIDE 20 MEQ PO PACK: 40.0000 meq | PACK | Freq: Two times a day (BID) | ORAL | Status: DC

## 2014-12-17 MED ORDER — POTASSIUM CHLORIDE CRYS ER 20 MEQ PO TBCR
40.0000 meq | EXTENDED_RELEASE_TABLET | Freq: Two times a day (BID) | ORAL | Status: AC
  Administered 2014-12-17 (×2): 40 meq via ORAL
  Filled 2014-12-17 (×2): qty 2

## 2014-12-17 NOTE — Clinical Social Work Note (Signed)
CSW reviewed chat once pt is medically stable she will transition back to [employer]. CSW will continue to assist and follow.    Colton Engdahl, MSW, LCSWA [phone]

## 2014-12-17 NOTE — Progress Notes (Addendum)
PROGRESS NOTE  Monica Blankenship TDD:[id number] DOB: 27-Apr-1929 DOA: 12/14/2014 PCP: Georgann Housekeeper, MD  HPI: Monica Blankenship is an 81 yo African American female with a PMH of HTN, recent stroke, subacute bacterial endocarditis, rheumatoid arthritis, mitral stenosis, aortic regurgitation, CKD. Pt was in rehab facility post stroke (L sided) and had been refusing meds x 1 week, including antibiotic for UTI. She then came by EMS to the ED on 12/14/2014 with reports of fever, decreased mental status and combative episodes.   Subjective: - Patient is awake and talking, same as yesterday: speech is fairly clear but sometimes confused.  - Pt did not sleep well last night. Appetite is moderate. Pt denies nausea, vomiting, abdominal pain. Pt's daughter reports pt has pain when raising left leg.   Assessment/Plan:  Hx of stroke - Patient was recently hospitalized because of the stroke from a 12/14-12/16. Patient was discharged on aspirin and Plavix. She has left-sided weakness and left facial droop, which are most likely from previous stroke. Pt's family reported new combative behavior and depressed mood a few weeks ago in rehab center. - repeat MRI 12/15/2014 showed "Worsening/acute extension of watershed infarctions in the deep white matter of the right cerebral hemisphere when compared to the previous study" (Dec. 2015). - neurology consulted, continue aspirin and plavix  Hypokalemia - replete and monitor  SIRS - febrile and tachycardic on admission, no clear source - urine culture negative; however pt received antibiotics prior to hospitalization - blood cultures speciated coagulase negative Staph in 1 of 2 bottles; likely contaminant.  - Repeat blood cultures today to ensure negativity; continue Vancomycin and Cefepime  Recent history of MSSA bacteremia with endocarditis  - diagnosed in May 2015, completed a course of IV antibiotics, seen by ID as an outpatient most recently in July  2015 - repeat 2D echo 12/15/2014 showed no vegetation on valves - As above, blood cultures show Staph coagulase negative, repeat   Acute encephalopathy  - unclear etiology, possibly infectious in origin with SIRS vs CVA, improved  Minimal troponin elevation - troponin flat x 3, no chest pain, likely demand due to #1  Elevated BNP - 2D echo reassuring, normal EF, overall stable echo when compared to previous  Hypertension - resume home medications  Hx of rheumatoid arthritis - continue home low dose prednisone, 5 mg daily  DVT Prophylaxis:  Heparin   Code Status: Full Family Communication: Daughter [name] at bedside. Disposition Plan: Inpatient. (Possible discharge to rehab/SNF next week.)   Consultants:  Neurology  Procedures:  None  Antibiotics:  Cefepime 1/13>>  Vancomycin 1/13>>  Objective: Filed Vitals:   12/16/14 1627 12/16/14 1824 12/17/14 0500 12/17/14 0927  BP: 130/62 136/47  140/65  Pulse:  73  107  Temp:    99.8 F (37.7 C)  TempSrc:    Oral  Resp:  20  20  Height:      Weight:   73.392 kg (161 lb 12.8 oz)   SpO2:  98%  98%    Intake/Output Summary (Last 24 hours) at 12/17/14 1129 Last data filed at 12/16/14 2231  Gross per 24 hour  Intake      3 ml  Output      0 ml  Net      3 ml   Filed Weights   12/14/14 2335 12/16/14 0500 12/17/14 0500  Weight: 73.347 kg (161 lb 11.2 oz) 75.569 kg (166 lb 9.6 oz) 73.392 kg (161 lb 12.8 oz)   Exam: General: Well  developed, well nourished, NAD, appears stated age  HEENT:  PERR, EOMI, Anicteic Sclera, MMM. No pharyngeal erythema or exudates  Neck: Supple, no JVD, no masses  Cardiovascular: RRR, S1 S2 auscultated, no rubs, murmurs or gallops; pedal pulses 3+ b/l. Respiratory: Clear to auscultation bilaterally with equal chest rise  Abdomen: Soft, nontender, nondistended, + bowel sounds  Extremities: warm dry without cyanosis clubbing or edema.  Neuro: AAOx3, speech sometimes confused; L hand grip  0/5; R hand grip 4/5. L leg 3/5, R leg 5/5.  Skin:  Psych: Normal affect and demeanor with intact judgement and insight  Data Reviewed: Basic Metabolic Panel:  Recent Labs Lab 12/14/14 1727 12/15/14 0645 12/16/14 0312 12/17/14 0645  NA 141 133* 139 140  K 3.3* 3.2* 3.5 3.0*  CL 106 101 107 106  CO2 22 23 24 21   GLUCOSE 96 79 94 116*  BUN 5* 6 8 6   CREATININE 0.85 0.75 0.75 0.71  CALCIUM 9.6 8.6 9.2 9.3   Liver Function Tests:  Recent Labs Lab 12/14/14 1727 12/15/14 0645 12/16/14 0312 12/17/14 0645  AST 37 33 33 26  ALT 25 20 20 18   ALKPHOS 74 68 68 63  BILITOT 1.5* 1.7* 1.9* 1.5*  PROT 7.4 6.7 6.7 6.9  ALBUMIN 3.7 3.2* 3.1* 3.4*    Recent Labs Lab 12/14/14 2252  LIPASE 36    Recent Labs Lab 12/15/14 1000  AMMONIA 37*   CBC:  Recent Labs Lab 12/14/14 1727 12/15/14 0645  WBC 9.4 9.0  NEUTROABS 6.2  --   HGB 13.8 12.5  HCT 40.7 37.4  MCV 97.4 95.4  PLT 166 170   Cardiac Enzymes:  Recent Labs Lab 12/15/14 0045 12/15/14 0645 12/15/14 1135  TROPONINI 0.06* 0.08* 0.06*   BNP (last 3 results)  Recent Labs  04/08/14 0807 04/09/14 0909  PROBNP 5143.0* 16685.0*   CBG:  Recent Labs Lab 12/17/14 0738  GLUCAP 108*    Recent Results (from the past 240 hour(s))  Blood Culture (routine x 2)     Status: None   Collection Time: 12/14/14  5:35 PM  Result Value Ref Range Status   Specimen Description BLOOD LEFT ARM  Final   Special Requests BOTTLES DRAWN AEROBIC AND ANAEROBIC 5CC  Final   Culture   Final    STAPHYLOCOCCUS SPECIES (COAGULASE NEGATIVE) Note: THE SIGNIFICANCE OF ISOLATING THIS ORGANISM FROM A SINGLE SET OF BLOOD CULTURES WHEN MULTIPLE SETS ARE DRAWN IS UNCERTAIN. PLEASE NOTIFY THE MICROBIOLOGY DEPARTMENT WITHIN ONE WEEK IF SPECIATION AND SENSITIVITIES ARE REQUIRED. Note: Gram Stain Report Called to,Read Back By and Verified With: MONICA KARLEN ON 1.13.2016 AT 11:16P BY WILEJ Performed at 12/19/14    Report Status  12/17/2014 FINAL  Final  Blood Culture (routine x 2)     Status: None (Preliminary result)   Collection Time: 12/14/14  5:50 PM  Result Value Ref Range Status   Specimen Description BLOOD RIGHT ARM  Final   Special Requests BOTTLES DRAWN AEROBIC AND ANAEROBIC 5CC  Final   Culture   Final           BLOOD CULTURE RECEIVED NO GROWTH TO DATE CULTURE WILL BE HELD FOR 5 DAYS BEFORE ISSUING A FINAL NEGATIVE REPORT Performed at [facility]    Report Status PENDING  Incomplete  Urine culture     Status: None   Collection Time: 12/14/14  6:24 PM  Result Value Ref Range Status   Specimen Description URINE, CATHETERIZED  Final   Special  Requests NONE  Final   Colony Count NO GROWTH Performed at [facility]   Final   Culture NO GROWTH Performed at [facility]   Final   Report Status 01/03/15 FINAL  Final     Studies: Mr Lodema Pilot Contrast  01/03/15   CLINICAL DATA:  Hypertension. Recent stroke. Bacterial endocarditis. Fever and worsened mental status.  EXAM: MRI HEAD WITHOUT AND WITH CONTRAST  TECHNIQUE: Multiplanar, multiecho pulse sequences of the brain and surrounding structures were obtained without and with intravenous contrast.  CONTRAST:  53mL MULTIHANCE GADOBENATE DIMEGLUMINE 529 MG/ML IV SOLN  COMPARISON:  Head CT 12/14/2014.  MRI 11/15/2014.  FINDINGS: There has been progression of acute watershed infarctions within the right hemisphere primarily affecting the deep white matter. This is most notable in the frontal parietal deep white matter. No acute infarction affecting the brainstem, cerebellum or left cerebral hemisphere. No hemorrhage or change in midline position.  Background pattern of extensive chronic small vessel disease remains evident affecting both cerebral hemispheres. Old bilateral frontal cortical and subcortical infarction appears the same.  No evidence of neoplastic mass lesion, hydrocephalus or extra-axial collection. After contrast  administration, no abnormal enhancement occurs.  IMPRESSION: Worsening/acute extension of watershed infarctions in the deep white matter of the right cerebral hemisphere when compared to the previous study.   Electronically Signed   By: Paulina Fusi M.D.   On: 03-Jan-2015 18:32    Scheduled Meds: . aspirin  324 mg Oral Daily  . atorvastatin  80 mg Oral q1800  . ceFEPime (MAXIPIME) IV  1 g Intravenous Q12H  . cholecalciferol  1,000 Units Oral Daily  . clopidogrel  75 mg Oral Daily  . diclofenac sodium  4 g Topical TID  . doxazosin  8 mg Oral QHS  . feeding supplement (ENSURE COMPLETE)  237 mL Oral TID BM  . ferrous sulfate  325 mg Oral Q breakfast  . folic acid  1 mg Oral Daily  . heparin  5,000 Units Subcutaneous 3 times per day  . hydrALAZINE  75 mg Oral TID  . potassium chloride  40 mEq Oral BID  . predniSONE  5 mg Oral Q breakfast  . sodium chloride  3 mL Intravenous Q12H  . vancomycin  750 mg Intravenous Q12H  . verapamil  120 mg Oral Daily   Continuous Infusions:   Principal Problem:   Encephalopathy acute Active Problems:   Mitral stenosis   Aortic stenosis   Aortic regurgitation   Chronic diastolic CHF (congestive heart failure)   Pulmonary HTN   HTN (hypertension)   Stroke   Hypertension   Acute encephalopathy   Hypokalemia    Costin M. Elvera Lennox, MD [facility] [phone] [facility] Pager [phone]. If 7PM-7AM, please contact night-coverage at [email or web address], password [facility] 12/17/2014, 11:29 AM  LOS: 3 days

## 2014-12-17 NOTE — Progress Notes (Signed)
STROKE TEAM PROGRESS NOTE   HISTORY Monica Blankenship is an 81 y.o. female who was recently seen in the hospital one month ago for a right MCA watershed infarct in the setting of high grade stenosis of right MI branch. At that time no carotid artery stenosis was found and no emboli was seen on TTE. Patient was placed on ASA and Plavix at that time. In the past several days, patient became confused. She refused taking her medications in last week, including antibiotics for UTI and ASA/Plavix. 1/2 blood cultures has grown gram postive cocci--awaiting final result. Due to confusion and left sided weakness MRI was obtained showing further evolution of the right watershed infarct. Neurology was consulted.   Date last known well: Unable to determine Time last known well: Unable to determine tPA Given: No: recent stroke and no LSW   SUBJECTIVE (INTERVAL HISTORY) Patient's daughter at the bedside. The patient remains confused although she is not agitated or combative. Dr. Pearlean Brownie did not feel that a further stroke workup was indicated. He felt the patient could return to her rehabilitation facility on Monday.patient and daughter are counseled about the need to be compliant with her antiplatelet therapy as well as aggressive risk factor modification to prevent further recurrent strokes   OBJECTIVE Temp:  [99.8 F (37.7 C)] 99.8 F (37.7 C) (01/15 0927) Pulse Rate:  [73-107] 107 (01/15 0927) Cardiac Rhythm:  [-] Normal sinus rhythm (01/14 2000) Resp:  [20] 20 (01/15 0927) BP: (130-140)/(47-65) 140/65 mmHg (01/15 0927) SpO2:  [98 %] 98 % (01/15 0927) Weight:  [161 lb 12.8 oz (73.392 kg)] 161 lb 12.8 oz (73.392 kg) (01/15 0500)   Recent Labs Lab 12/17/14 0738  GLUCAP 108*    Recent Labs Lab 12/14/14 1727 12/15/14 0645 12/16/14 0312 12/17/14 0645  NA 141 133* 139 140  K 3.3* 3.2* 3.5 3.0*  CL 106 101 107 106  CO2 22 23 24 21   GLUCOSE 96 79 94 116*  BUN 5* 6 8 6   CREATININE 0.85 0.75  0.75 0.71  CALCIUM 9.6 8.6 9.2 9.3    Recent Labs Lab 12/14/14 1727 12/15/14 0645 12/16/14 0312 12/17/14 0645  AST 37 33 33 26  ALT 25 20 20 18   ALKPHOS 74 68 68 63  BILITOT 1.5* 1.7* 1.9* 1.5*  PROT 7.4 6.7 6.7 6.9  ALBUMIN 3.7 3.2* 3.1* 3.4*    Recent Labs Lab 12/14/14 1727 12/15/14 0645  WBC 9.4 9.0  NEUTROABS 6.2  --   HGB 13.8 12.5  HCT 40.7 37.4  MCV 97.4 95.4  PLT 166 170    Recent Labs Lab 12/15/14 0045 12/15/14 0645 12/15/14 1135  TROPONINI 0.06* 0.08* 0.06*    Recent Labs  12/15/14 0215  LABPROT 15.6*  INR 1.23    Recent Labs  12/14/14 1811  COLORURINE YELLOW  LABSPEC 1.018  PHURINE 7.0  GLUCOSEU NEGATIVE  HGBUR NEGATIVE  BILIRUBINUR NEGATIVE  KETONESUR 15*  PROTEINUR NEGATIVE  UROBILINOGEN 1.0  NITRITE NEGATIVE  LEUKOCYTESUR NEGATIVE       Component Value Date/Time   CHOL 129 11/16/2014 0620   TRIG 69 11/16/2014 0620   HDL 40 11/16/2014 0620   CHOLHDL 3.2 11/16/2014 0620   VLDL 14 11/16/2014 0620   LDLCALC 75 11/16/2014 0620   Lab Results  Component Value Date   HGBA1C 5.7* 11/15/2014      Component Value Date/Time   LABOPIA NONE DETECTED 12/15/2014 0026   COCAINSCRNUR NONE DETECTED 12/15/2014 0026   LABBENZ NONE DETECTED 12/15/2014  0026   AMPHETMU NONE DETECTED 12/15/2014 0026   THCU NONE DETECTED 12/15/2014 0026   LABBARB NONE DETECTED 12/15/2014 0026    No results for input(s): ETH in the last 168 hours.    2-D echocardiogram 12/15/2014 Study Conclusions  - Left ventricle: The cavity size was normal. Wall thickness was increased in a pattern of moderate LVH. The estimated ejection fraction was 60%. Wall motion was normal; there were no regional wall motion abnormalities. - Aortic valve: Moderately thickened leaflets. There was very mild stenosis. Mean gradient (S): 11 mm Hg. Peak gradient (S): 19 mm Hg. - Mitral valve: There is thickening of the leaflets with tethering of the tips. There is  mitral stenosis. The doppler data is difficult to assess. The most reliable data is probably the mean gradient of and peak gradient of . The data is further affected by the presence of significant MR. Compared to the data from 11/16/2014 study, I suspect there is no significant change. This is significant mitral stenosis. - Left atrium: The atrium was moderately to severely dilated. - Right ventricle: The cavity size was moderately dilated. Systolic function was mildly reduced. - Right atrium: The atrium was moderately to severely dilated. Impressions: - Unfortunately, a good TR signal could not be obtained. Therefore, pulmonary hypertension can not be assessed.   Mr Monica Blankenship Contrast 12/15/2014    Worsening/acute extension of watershed infarctions in the deep white matter of the right cerebral hemisphere when compared to the previous study.     MRA  - 11/15/2014 -  1. Focal severe high-grade stenosis measuring approximately 4 mm in length within the mid right M1 segment. Flow is attenuated within the distal right MCA artery branches. 2. Additional multi focal atherosclerotic irregularity throughout the intracranial circulation. 3. 4 mm somewhat linear focal outpouching arising from the lateral aspect of the cavernous left ICA. This may reflect artifact versus a small aneurysm. A follow-up study in 3 months to document stability    PHYSICAL EXAM Elderly African american lady not in distress. . Afebrile. Head is nontraumatic. Neck is supple without bruit. Hearing is normal. Cardiac exam no murmur or gallop. Lungs are clear to auscultation. Distal pulses are well felt. Neurological Exam : awake alert oriented 2. Mild dysarthria but can be understood. No aphasia. Extraocular movements are full range without nystagmus. Blinks to threat on the right well and to a lesser degree on the left.moderate left lower facial weakness. Tongue midline. Left hemiparesis with  left upper and lower extremity drift. Grade 2-3/5 strength in the left hemibody with  severe weakness of distal hand and foot muscles. Decreased coordination and sensation on the left hemibody. Right-sided normal strength sensation and coordination. Right plantar downgoing left upgoing. Gait was not tested.   ASSESSMENT/PLAN Ms. ESTEFFANY NEKOLA is a 81 y.o. female with history of a recent stroke, valvular heart disease, and obesity, presenting with confusion, agitation, and left hemiparesis. . She did not receive IV t-PA due to recent stroke..   Stroke:  Non-dominant   Resultant  left hemiparesis  MRI - Worsening/acute extension of watershed infarctions in the deep white matter of the right cerebral hemisphere.  MRA  - 11/15/2014 - see above  Carotid Doppler  performed 11/16/2014 - unremarkable   2D Echo  as above  LDL - 75  HgbA1c - 5.7 in December  Subcutaneous heparin for VTE prophylaxis  DIET DYS 3with thin liquids  aspirin 81 mg orally every day and clopidogrel 75 mg orally  every day prior to admission, now on aspirin 325 mg orally every day and clopidogrel 75 mg orally every day  Patient counseled to be compliant with her antithrombotic medications  Ongoing aggressive stroke risk factor management  Therapy recommendations:  Pending   Disposition:  Pending  Hypertension  Home meds:  Cardura 8 mg daily at bedtime, hydralazine 75 mg every 8 hours, and Calan SR 120 mg daily.  Stable  Hyperlipidemia  Home meds:  Lipitor 80 mg daily  resumed in hospital  LDL 75, goal < 70  Continue statin at discharge  Diabetes  HgbA1c 5.7  goal < 7.0  Controlled  Other Stroke Risk Factors  Advanced age  Obesity, Body mass index is 29.59 kg/(m^2).   Hx stroke/TIA  Family hx stroke (mother)  Medical noncompliance secondary to confusion and agitation   Other Active Problems  Blood cultures pending  Hypokalemia  Possible small left internal carotid artery  aneurysm. Follow-up study recommended in mid March.  Plan  No further stroke workup is indicated.  Stroke team will sign off at this time. Please call if we can be of further service.  Hospital day # 3  Delton See PA-C [facility] Pager [phone] 12/17/2014, 4:01 PM I have personally examined this patient, reviewed notes, independently viewed imaging studies, participated in medical decision making and plan of care. I have made any additions or clarifications directly to the above note. Agree with note above.patient had a right MCA infarct one month ago due to right M1 stenosis. She has been confused, agitated and noncompliant with her medicines for the last 2 weeks in the setting of possible infection and has had subjective worsening of her deficits with objective worsening of MRI infarct. I do not believe further stroke workup is necessary. Patient and daughter counseled her to be compliant with antiplatelet therapy and aggressive risk factor modification. Continue ongoing therapies and treatment for infection. Stroke team will sign off and the call for questions.D/W Dr Elvera Lennox.  Delia Heady, MD Medical Director [facility] St[facility] Pager: [phone] 12/17/2014 5:59 PM     To contact Stroke Continuity provider, please refer to [email or web address]. After hours, contact General Neurology

## 2014-12-18 LAB — GLUCOSE, CAPILLARY: Glucose-Capillary: 105 mg/dL — ABNORMAL HIGH (ref 70–99)

## 2014-12-18 LAB — BASIC METABOLIC PANEL
ANION GAP: 10 (ref 5–15)
BUN: <5 mg/dL — ABNORMAL LOW (ref 6–23)
CHLORIDE: 109 meq/L (ref 96–112)
CO2: 23 mmol/L (ref 19–32)
CREATININE: 0.69 mg/dL (ref 0.50–1.10)
Calcium: 9.2 mg/dL (ref 8.4–10.5)
GFR calc Af Amer: >90 mL/min (ref >90)
GFR, EST NON AFRICAN AMERICAN: 78 mL/min — AB (ref >90)
Glucose, Bld: 134 mg/dL — ABNORMAL HIGH (ref 70–99)
Potassium: 3.2 mmol/L — ABNORMAL LOW (ref 3.5–5.1)
Sodium: 142 mmol/L (ref 135–145)

## 2014-12-18 LAB — CBC
HCT: 35.5 % — ABNORMAL LOW (ref 36.0–46.0)
HEMOGLOBIN: 11.8 g/dL — AB (ref 12.0–15.0)
MCH: 32.1 pg (ref 26.0–34.0)
MCHC: 33.2 g/dL (ref 30.0–36.0)
MCV: 96.5 fL (ref 78.0–100.0)
PLATELETS: 172 10*3/uL (ref 150–400)
RBC: 3.68 MIL/uL — AB (ref 3.87–5.11)
RDW: 15.1 % (ref 11.5–15.5)
WBC: 6.8 10*3/uL (ref 4.0–10.5)

## 2014-12-18 MED ORDER — POTASSIUM CHLORIDE CRYS ER 20 MEQ PO TBCR
40.0000 meq | EXTENDED_RELEASE_TABLET | Freq: Once | ORAL | Status: AC
  Administered 2014-12-18: 40 meq via ORAL
  Filled 2014-12-18: qty 2

## 2014-12-18 MED ORDER — PANTOPRAZOLE SODIUM 40 MG PO TBEC
40.0000 mg | DELAYED_RELEASE_TABLET | Freq: Every day | ORAL | Status: DC
  Administered 2014-12-18 – 2014-12-22 (×4): 40 mg via ORAL
  Filled 2014-12-18 (×4): qty 1

## 2014-12-18 NOTE — Progress Notes (Signed)
PROGRESS NOTE  Monica Blankenship WUX:[id number] DOB: 07-13-1929 DOA: 12/14/2014 PCP: Georgann Housekeeper, MD  HPI: Monica Blankenship is an 81 yo African American female with a PMH of HTN, recent stroke, subacute bacterial endocarditis, rheumatoid arthritis, mitral stenosis, aortic regurgitation, CKD. Pt was in rehab facility post stroke (L sided) and had been refusing meds x 1 week, including antibiotic for UTI. She then came by EMS to the ED on 12/14/2014 with reports of fever, decreased mental status and combative episodes.   Subjective: - sleeping this morning  Assessment/Plan:  Hx of stroke - Patient was recently hospitalized because of the stroke from a 12/14-12/16. Patient was discharged on aspirin and Plavix. She has left-sided weakness and left facial droop, which are most likely from previous stroke. Pt's family reported new combative behavior and depressed mood a few weeks ago in rehab center. - repeat MRI 12/15/2014 showed "Worsening/acute extension of watershed infarctions in the deep white matter of the right cerebral hemisphere when compared to the previous study" (Dec. 2015). - neurology consulted, continue aspirin and plavix  Hypokalemia - better, continue repletion  SIRS - febrile and tachycardic on admission, no clear source, improving - urine culture negative; however pt received antibiotics prior to hospitalization - blood cultures speciated coagulase negative Staph in 1 of 2 bottles; likely contaminant.  - Repeat blood cultures negative, continue Vancomycin and Cefepime for 24 more hours  Recent history of MSSA bacteremia with endocarditis  - diagnosed in May 2015, completed a course of IV antibiotics, seen by ID as an outpatient most recently in July 2015 - repeat 2D echo 12/15/2014 showed no vegetation on valves - As above, blood cultures show Staph coagulase negative, repeat   Acute encephalopathy  - unclear etiology, possibly infectious in origin with SIRS vs CVA,  improved  Minimal troponin elevation - troponin flat x 3, no chest pain, likely demand due to #1  Elevated BNP - 2D echo reassuring, normal EF, overall stable echo when compared to previous  Hypertension - resume home medications  Hx of rheumatoid arthritis - continue home low dose prednisone, 5 mg daily  DVT Prophylaxis:  Heparin   Code Status: Full Family Communication: none this morning Disposition Plan: Inpatient. (Possible discharge to rehab/SNF next week.)   Consultants:  Neurology  Procedures:  None  Antibiotics:  Cefepime 1/13>>  Vancomycin 1/13>>  Objective: Filed Vitals:   12/17/14 2132 12/18/14 0146 12/18/14 0550 12/18/14 0957  BP: 162/58 142/62 161/62 128/72  Pulse: 82 79 82 70  Temp: 98 F (36.7 C) 98.4 F (36.9 C) 98.4 F (36.9 C) 98.9 F (37.2 C)  TempSrc: Oral Oral Oral Oral  Resp: 16 16 16 18   Height:      Weight:      SpO2: 97% 95% 94% 95%    Intake/Output Summary (Last 24 hours) at 12/18/14 1343 Last data filed at 12/18/14 0700  Gross per 24 hour  Intake    123 ml  Output      0 ml  Net    123 ml   Filed Weights   12/14/14 2335 12/16/14 0500 12/17/14 0500  Weight: 73.347 kg (161 lb 11.2 oz) 75.569 kg (166 lb 9.6 oz) 73.392 kg (161 lb 12.8 oz)   Exam: General: Well developed, well nourished, NAD, appears stated age  HEENT:  PERR, EOMI, Anicteic Sclera, MMM. No pharyngeal erythema or exudates  Neck: Supple, no JVD, no masses  Cardiovascular: RRR, S1 S2 auscultated, no rubs, murmurs or gallops; pedal pulses 3+  b/l. Respiratory: Clear to auscultation bilaterally with equal chest rise  Abdomen: Soft, nontender, nondistended, + bowel sounds  Extremities: warm dry without cyanosis clubbing or edema.  Neuro: AAOx3, speech sometimes confused; L hand grip 0/5; R hand grip 4/5. L leg 3/5, R leg 5/5.  Skin:  Psych: Normal affect and demeanor with intact judgement and insight  Data Reviewed: Basic Metabolic Panel:  Recent Labs Lab  12/14/14 1727 12/15/14 0645 12/16/14 0312 12/17/14 0645 12/18/14 1010  NA 141 133* 139 140 142  K 3.3* 3.2* 3.5 3.0* 3.2*  CL 106 101 107 106 109  CO2 22 23 24 21 23   GLUCOSE 96 79 94 116* 134*  BUN 5* 6 8 6  <5*  CREATININE 0.85 0.75 0.75 0.71 0.69  CALCIUM 9.6 8.6 9.2 9.3 9.2   Liver Function Tests:  Recent Labs Lab 12/14/14 1727 12/15/14 0645 12/16/14 0312 12/17/14 0645  AST 37 33 33 26  ALT 25 20 20 18   ALKPHOS 74 68 68 63  BILITOT 1.5* 1.7* 1.9* 1.5*  PROT 7.4 6.7 6.7 6.9  ALBUMIN 3.7 3.2* 3.1* 3.4*    Recent Labs Lab 12/14/14 2252  LIPASE 36    Recent Labs Lab 12/15/14 1000  AMMONIA 37*   CBC:  Recent Labs Lab 12/14/14 1727 12/15/14 0645 12/18/14 0525  WBC 9.4 9.0 6.8  NEUTROABS 6.2  --   --   HGB 13.8 12.5 11.8*  HCT 40.7 37.4 35.5*  MCV 97.4 95.4 96.5  PLT 166 170 172   Cardiac Enzymes:  Recent Labs Lab 12/15/14 0045 12/15/14 0645 12/15/14 1135  TROPONINI 0.06* 0.08* 0.06*   BNP (last 3 results)  Recent Labs  04/08/14 0807 04/09/14 0909  PROBNP 5143.0* 16685.0*   CBG:  Recent Labs Lab 12/17/14 0738 12/18/14 0551  GLUCAP 108* 105*    Recent Results (from the past 240 hour(s))  Blood Culture (routine x 2)     Status: None   Collection Time: 12/14/14  5:35 PM  Result Value Ref Range Status   Specimen Description BLOOD LEFT ARM  Final   Special Requests BOTTLES DRAWN AEROBIC AND ANAEROBIC 5CC  Final   Culture   Final    STAPHYLOCOCCUS SPECIES (COAGULASE NEGATIVE) Note: THE SIGNIFICANCE OF ISOLATING THIS ORGANISM FROM A SINGLE SET OF BLOOD CULTURES WHEN MULTIPLE SETS ARE DRAWN IS UNCERTAIN. PLEASE NOTIFY THE MICROBIOLOGY DEPARTMENT WITHIN ONE WEEK IF SPECIATION AND SENSITIVITIES ARE REQUIRED. Note: Gram Stain Report Called to,Read Back By and Verified With: MONICA KARLEN ON 1.13.2016 AT 11:16P BY WILEJ Performed at 12/20/14    Report Status 12/17/2014 FINAL  Final  Blood Culture (routine x 2)     Status: None  (Preliminary result)   Collection Time: 12/14/14  5:50 PM  Result Value Ref Range Status   Specimen Description BLOOD RIGHT ARM  Final   Special Requests BOTTLES DRAWN AEROBIC AND ANAEROBIC 5CC  Final   Culture   Final           BLOOD CULTURE RECEIVED NO GROWTH TO DATE CULTURE WILL BE HELD FOR 5 DAYS BEFORE ISSUING A FINAL NEGATIVE REPORT Performed at [facility]    Report Status PENDING  Incomplete  Urine culture     Status: None   Collection Time: 12/14/14  6:24 PM  Result Value Ref Range Status   Specimen Description URINE, CATHETERIZED  Final   Special Requests NONE  Final   Colony Count NO GROWTH Performed at 02/12/15   Final  Culture NO GROWTH Performed at [facility]   Final   Report Status 12/15/2014 FINAL  Final  Culture, blood (routine x 2)     Status: None (Preliminary result)   Collection Time: 12/17/14 10:32 AM  Result Value Ref Range Status   Specimen Description BLOOD RIGHT HAND  Final   Special Requests BOTTLES DRAWN AEROBIC ONLY 3CC  Final   Culture   Final           BLOOD CULTURE RECEIVED NO GROWTH TO DATE CULTURE WILL BE HELD FOR 5 DAYS BEFORE ISSUING A FINAL NEGATIVE REPORT Performed at [facility]    Report Status PENDING  Incomplete  Culture, blood (routine x 2)     Status: None (Preliminary result)   Collection Time: 12/17/14 10:41 AM  Result Value Ref Range Status   Specimen Description BLOOD LEFT HAND  Final   Special Requests BOTTLES DRAWN AEROBIC ONLY 5CC  Final   Culture   Final           BLOOD CULTURE RECEIVED NO GROWTH TO DATE CULTURE WILL BE HELD FOR 5 DAYS BEFORE ISSUING A FINAL NEGATIVE REPORT Performed at [facility]    Report Status PENDING  Incomplete     Studies: No results found.  Scheduled Meds: . aspirin  324 mg Oral Daily  . atorvastatin  80 mg Oral q1800  . ceFEPime (MAXIPIME) IV  1 g Intravenous Q12H  . cholecalciferol  1,000 Units Oral Daily  . clopidogrel  75 mg Oral Daily   . diclofenac sodium  4 g Topical TID  . doxazosin  8 mg Oral QHS  . feeding supplement (ENSURE COMPLETE)  237 mL Oral TID BM  . ferrous sulfate  325 mg Oral Q breakfast  . folic acid  1 mg Oral Daily  . heparin  5,000 Units Subcutaneous 3 times per day  . hydrALAZINE  75 mg Oral TID  . pantoprazole  40 mg Oral Daily  . predniSONE  5 mg Oral Q breakfast  . sodium chloride  3 mL Intravenous Q12H  . vancomycin  750 mg Intravenous Q12H  . verapamil  120 mg Oral Daily   Continuous Infusions:   Principal Problem:   Encephalopathy acute Active Problems:   Mitral stenosis   Aortic stenosis   Aortic regurgitation   Chronic diastolic CHF (congestive heart failure)   Pulmonary HTN   HTN (hypertension)   Stroke   Hypertension   Acute encephalopathy   Hypokalemia    Monica Blankenship M. Elvera Lennox, MD [facility] [phone] [facility] Pager [phone]. If 7PM-7AM, please contact night-coverage at [email or web address], password [facility] 12/18/2014, 1:43 PM  LOS: 4 days

## 2014-12-18 NOTE — Progress Notes (Signed)
ANTIBIOTIC CONSULT NOTE - INITIAL  Pharmacy Consult for vancomycin + cefepime Indication: empiric for fevers, ?sepsis with history of endocarditis  Allergies  Allergen Reactions  . Fentanyl Other (See Comments)    confusion    Patient Measurements: Height: 5\' 2"  (157.5 cm) Weight: 161 lb 12.8 oz (73.392 kg) IBW/kg (Calculated) : 50.1  Vital Signs: Temp: 98.7 F (37.1 C) (01/16 1353) Temp Source: Oral (01/16 1353) BP: 120/36 mmHg (01/16 1353) Pulse Rate: 64 (01/16 1353) Intake/Output from previous day: 01/15 0701 - 01/16 0700 In: 123 [P.O.:120; I.V.:3] Out: -  Intake/Output from this shift:    Labs:  Recent Labs  12/16/14 0312 12/17/14 0645 12/18/14 0525 12/18/14 1010  WBC  --   --  6.8  --   HGB  --   --  11.8*  --   PLT  --   --  172  --   CREATININE 0.75 0.71  --  0.69   Estimated Creatinine Clearance: 50 mL/min (by C-G formula based on Cr of 0.69). No results for input(s): VANCOTROUGH, VANCOPEAK, VANCORANDOM, GENTTROUGH, GENTPEAK, GENTRANDOM, TOBRATROUGH, TOBRAPEAK, TOBRARND, AMIKACINPEAK, AMIKACINTROU, AMIKACIN in the last 72 hours.    Medical History: Past Medical History  Diagnosis Date  . Allergy   . Anxiety   . Insomnia   . Osteoarthritis   . Anemia   . Obesity   . Diastolic dysfunction   . CKD (chronic kidney disease)   . Mitral stenosis     with moderate MR on echo 4/14  . Aortic stenosis     mild by echo 4/14  . Aortic regurgitation     mild to moderate by echo 4/14  . HTN (hypertension)     Medications:  Anti-infectives    Start     Dose/Rate Route Frequency Ordered Stop   12/15/14 1100  ceFEPIme (MAXIPIME) 1 g in dextrose 5 % 50 mL IVPB     1 g100 mL/hr over 30 Minutes Intravenous Every 12 hours 12/15/14 0955     12/15/14 1100  vancomycin (VANCOCIN) IVPB 750 mg/150 ml premix     750 mg150 mL/hr over 60 Minutes Intravenous Every 12 hours 12/15/14 0955     12/15/14 0200  ceFEPIme (MAXIPIME) 1 g in dextrose 5 % 50 mL IVPB  Status:   Discontinued     1 g100 mL/hr over 30 Minutes Intravenous Every 8 hours 12/14/14 1839 12/14/14 2334   12/14/14 1800  ceFEPIme (MAXIPIME) 1 g in dextrose 5 % 50 mL IVPB     1 g100 mL/hr over 30 Minutes Intravenous  Once 12/14/14 1755 12/14/14 1928     Assessment: 85 yof presented to the hospital with AMS. Initially started on cefepime but discontinued. Now restarting with cefepime + vancomycin empirically for fever and sepsis. Has a hx of recent endocarditis. Currently, no longer febrile, WBC is WNL. Scr is also WNl at 0.75.   Vanc 1/13>> Cefepime 1/12>>  1/12 Urine cx - NEG 1/12 Blood cx - CNS 1/2 (contaminant?) 1/15 blodd cx - pending  Goal of Therapy:  Vancomycin trough level 15-20 mcg/ml  Plan:  1. Cefepime 1gm IV Q12H 2. Vancomycin 750mg  IV Q12H 3. F/u renal fxn, C&S, clinical status and trough at SS 4. Would dc vanc given no s/sx of MRSA infxn  2/15 12/18/2014,2:37 PM

## 2014-12-19 LAB — GLUCOSE, CAPILLARY: Glucose-Capillary: 104 mg/dL — ABNORMAL HIGH (ref 70–99)

## 2014-12-19 MED ORDER — LORAZEPAM 2 MG/ML IJ SOLN
0.5000 mg | Freq: Once | INTRAMUSCULAR | Status: AC
  Administered 2014-12-19: 0.5 mg via INTRAVENOUS
  Filled 2014-12-19: qty 1

## 2014-12-19 NOTE — Progress Notes (Signed)
PROGRESS NOTE  Monica Blankenship WNI:[id number] DOB: 09-Nov-1929 DOA: 12/14/2014 PCP: Monica Housekeeper, MD  HPI: Monica Blankenship is an 81 yo African American female with a PMH of HTN, recent stroke, subacute bacterial endocarditis, rheumatoid arthritis, mitral stenosis, aortic regurgitation, CKD. Pt was in rehab facility post stroke (L sided) and had been refusing meds x 1 week, including antibiotic for UTI. She then came by EMS to the ED on 12/14/2014 with reports of fever, decreased mental status and combative episodes.   Subjective: - quite angry with me this morning, appears confused, refused labs  Assessment/Plan:  Hx of stroke - Patient was recently hospitalized because of the stroke from a 12/14-12/16. Patient was discharged on aspirin and Plavix. She has left-sided weakness and left facial droop, which are most likely from previous stroke. Pt's family reported new combative behavior and depressed mood a few weeks ago in rehab center. - repeat MRI 12/15/2014 showed "Worsening/acute extension of watershed infarctions in the deep white matter of the right cerebral hemisphere when compared to the previous study" (Dec. 2015). - neurology consulted, continue aspirin and plavix  Hypokalemia - refused labs this morning  SIRS - febrile and tachycardic on admission, no clear source, improving - urine culture negative; however pt received antibiotics prior to hospitalization - blood cultures speciated coagulase negative Staph in 1 of 2 bottles; likely contaminant.  - Repeat blood cultures negative, discontinue antibiotics today and closely monitor.   Recent history of MSSA bacteremia with endocarditis  - diagnosed in May 2015, completed a course of IV antibiotics, seen by ID as an outpatient most recently in July 2015 - repeat 2D echo 12/15/2014 showed no vegetation on valves - As above, blood cultures show Staph coagulase negative 1/2 bottles, likely a contaminant, repeat cultures  negative  Acute encephalopathy  - unclear etiology, possibly infectious in origin with SIRS vs CVA, improved  Minimal troponin elevation - troponin flat x 3, no chest pain, likely demand due to #1  Elevated BNP - 2D echo reassuring, normal EF, overall stable echo when compared to previous  Hypertension - resume home medications  Hx of rheumatoid arthritis - continue home low dose prednisone, 5 mg daily  DVT Prophylaxis:  Heparin  Code Status: Full Family Communication: none this morning Disposition Plan: Inpatient. (Possible discharge to rehab/SNF next week.)  Consultants:  Neurology  Procedures:  None  Antibiotics:  Cefepime 1/13>>  Vancomycin 1/13>>  Objective: Filed Vitals:   12/18/14 1353 12/18/14 1727 12/18/14 2128 12/19/14 0114  BP: 120/36 117/62 109/89 133/58  Pulse: 64 74 76   Temp: 98.7 F (37.1 C) 98.5 F (36.9 C) 98.1 F (36.7 C) 98.2 F (36.8 C)  TempSrc: Oral Oral Oral Oral  Resp: 18 18 18 18   Height:      Weight:      SpO2: 91% 96% 96% 97%   No intake or output data in the 24 hours ending 12/19/14 1207 Filed Weights   12/14/14 2335 12/16/14 0500 12/17/14 0500  Weight: 73.347 kg (161 lb 11.2 oz) 75.569 kg (166 lb 9.6 oz) 73.392 kg (161 lb 12.8 oz)   Exam: General: NAD HEENT:  Anicteic Sclera Neck: Supple, no JVD, no masses  Cardiovascular: RRR, S1 S2 auscultated, no rubs, murmurs or gallops; pedal pulses 3+ b/l. Respiratory: Clear to auscultation bilaterally with equal chest rise  Abdomen: Soft, nontender, nondistended, + bowel sounds  Neuro: left sided weakness Skin: no rashes Psych: Normal affect and demeanor with intact judgement and insight  Data Reviewed:  Basic Metabolic Panel:  Recent Labs Lab 12/14/14 1727 12/15/14 0645 12/16/14 0312 12/17/14 0645 12/18/14 1010  NA 141 133* 139 140 142  K 3.3* 3.2* 3.5 3.0* 3.2*  CL 106 101 107 106 109  CO2 22 23 24 21 23   GLUCOSE 96 79 94 116* 134*  BUN 5* 6 8 6  <5*  CREATININE 0.85  0.75 0.75 0.71 0.69  CALCIUM 9.6 8.6 9.2 9.3 9.2   Liver Function Tests:  Recent Labs Lab 12/14/14 1727 12/15/14 0645 12/16/14 0312 12/17/14 0645  AST 37 33 33 26  ALT 25 20 20 18   ALKPHOS 74 68 68 63  BILITOT 1.5* 1.7* 1.9* 1.5*  PROT 7.4 6.7 6.7 6.9  ALBUMIN 3.7 3.2* 3.1* 3.4*    Recent Labs Lab 12/14/14 2252  LIPASE 36    Recent Labs Lab 12/15/14 1000  AMMONIA 37*   CBC:  Recent Labs Lab 12/14/14 1727 12/15/14 0645 12/18/14 0525  WBC 9.4 9.0 6.8  NEUTROABS 6.2  --   --   HGB 13.8 12.5 11.8*  HCT 40.7 37.4 35.5*  MCV 97.4 95.4 96.5  PLT 166 170 172   Cardiac Enzymes:  Recent Labs Lab 12/15/14 0045 12/15/14 0645 12/15/14 1135  TROPONINI 0.06* 0.08* 0.06*   BNP (last 3 results)  Recent Labs  04/08/14 0807 04/09/14 0909  PROBNP 5143.0* 16685.0*   CBG:  Recent Labs Lab 12/17/14 0738 12/18/14 0551 12/19/14 0654  GLUCAP 108* 105* 104*    Recent Results (from the past 240 hour(s))  Blood Culture (routine x 2)     Status: None   Collection Time: 12/14/14  5:35 PM  Result Value Ref Range Status   Specimen Description BLOOD LEFT ARM  Final   Special Requests BOTTLES DRAWN AEROBIC AND ANAEROBIC 5CC  Final   Culture   Final    STAPHYLOCOCCUS SPECIES (COAGULASE NEGATIVE) Note: THE SIGNIFICANCE OF ISOLATING THIS ORGANISM FROM A SINGLE SET OF BLOOD CULTURES WHEN MULTIPLE SETS ARE DRAWN IS UNCERTAIN. PLEASE NOTIFY THE MICROBIOLOGY DEPARTMENT WITHIN ONE WEEK IF SPECIATION AND SENSITIVITIES ARE REQUIRED. Note: Gram Stain Report Called to,Read Back By and Verified With: Monica Blankenship ON 1.13.2016 AT 11:16P BY WILEJ Performed at [facility]    Report Status 12/17/2014 FINAL  Final  Blood Culture (routine x 2)     Status: None (Preliminary result)   Collection Time: 12/14/14  5:50 PM  Result Value Ref Range Status   Specimen Description BLOOD RIGHT ARM  Final   Special Requests BOTTLES DRAWN AEROBIC AND ANAEROBIC 5CC  Final   Culture    Final           BLOOD CULTURE RECEIVED NO GROWTH TO DATE CULTURE WILL BE HELD FOR 5 DAYS BEFORE ISSUING A FINAL NEGATIVE REPORT Performed at [facility]    Report Status PENDING  Incomplete  Urine culture     Status: None   Collection Time: 12/14/14  6:24 PM  Result Value Ref Range Status   Specimen Description URINE, CATHETERIZED  Final   Special Requests NONE  Final   Colony Count NO GROWTH Performed at [facility]   Final   Culture NO GROWTH Performed at [facility]   Final   Report Status 12/15/2014 FINAL  Final  Culture, blood (routine x 2)     Status: None (Preliminary result)   Collection Time: 12/17/14 10:32 AM  Result Value Ref Range Status   Specimen Description BLOOD RIGHT HAND  Final   Special  Requests BOTTLES DRAWN AEROBIC ONLY 3CC  Final   Culture   Final           BLOOD CULTURE RECEIVED NO GROWTH TO DATE CULTURE WILL BE HELD FOR 5 DAYS BEFORE ISSUING A FINAL NEGATIVE REPORT Performed at [facility]    Report Status PENDING  Incomplete  Culture, blood (routine x 2)     Status: None (Preliminary result)   Collection Time: 12/17/14 10:41 AM  Result Value Ref Range Status   Specimen Description BLOOD LEFT HAND  Final   Special Requests BOTTLES DRAWN AEROBIC ONLY 5CC  Final   Culture   Final           BLOOD CULTURE RECEIVED NO GROWTH TO DATE CULTURE WILL BE HELD FOR 5 DAYS BEFORE ISSUING A FINAL NEGATIVE REPORT Performed at [facility]    Report Status PENDING  Incomplete     Studies: No results found.  Scheduled Meds: . aspirin  324 mg Oral Daily  . atorvastatin  80 mg Oral q1800  . ceFEPime (MAXIPIME) IV  1 g Intravenous Q12H  . cholecalciferol  1,000 Units Oral Daily  . clopidogrel  75 mg Oral Daily  . diclofenac sodium  4 g Topical TID  . doxazosin  8 mg Oral QHS  . feeding supplement (ENSURE COMPLETE)  237 mL Oral TID BM  . ferrous sulfate  325 mg Oral Q breakfast  . folic acid  1 mg Oral Daily  . heparin   5,000 Units Subcutaneous 3 times per day  . hydrALAZINE  75 mg Oral TID  . pantoprazole  40 mg Oral Daily  . predniSONE  5 mg Oral Q breakfast  . sodium chloride  3 mL Intravenous Q12H  . vancomycin  750 mg Intravenous Q12H  . verapamil  120 mg Oral Daily   Continuous Infusions:   Principal Problem:   Encephalopathy acute Active Problems:   Mitral stenosis   Aortic stenosis   Aortic regurgitation   Chronic diastolic CHF (congestive heart failure)   Pulmonary HTN   HTN (hypertension)   Stroke   Hypertension   Acute encephalopathy   Hypokalemia    Brendaliz Kuk M. Elvera Lennox, MD [facility] [phone] [facility] Pager [phone]. If 7PM-7AM, please contact night-coverage at [email or web address], password [facility] 12/19/2014, 12:07 PM  LOS: 5 days

## 2014-12-19 NOTE — Progress Notes (Signed)
Pt continues to take tele off and is very agitated. MD aware and orders given for IV ativan.  Will continue to monitor.

## 2014-12-20 LAB — BASIC METABOLIC PANEL
Anion gap: 8 (ref 5–15)
BUN: 6 mg/dL (ref 6–23)
CALCIUM: 9.6 mg/dL (ref 8.4–10.5)
CHLORIDE: 109 meq/L (ref 96–112)
CO2: 22 mmol/L (ref 19–32)
CREATININE: 0.75 mg/dL (ref 0.50–1.10)
GFR calc non Af Amer: 76 mL/min — ABNORMAL LOW (ref >90)
GFR, EST AFRICAN AMERICAN: 88 mL/min — AB (ref >90)
GLUCOSE: 85 mg/dL (ref 70–99)
POTASSIUM: 3.3 mmol/L — AB (ref 3.5–5.1)
SODIUM: 139 mmol/L (ref 135–145)

## 2014-12-20 LAB — CBC
HEMATOCRIT: 38.3 % (ref 36.0–46.0)
HEMOGLOBIN: 12.7 g/dL (ref 12.0–15.0)
MCH: 31.8 pg (ref 26.0–34.0)
MCHC: 33.2 g/dL (ref 30.0–36.0)
MCV: 95.8 fL (ref 78.0–100.0)
Platelets: 240 10*3/uL (ref 150–400)
RBC: 4 MIL/uL (ref 3.87–5.11)
RDW: 15.3 % (ref 11.5–15.5)
WBC: 6.8 10*3/uL (ref 4.0–10.5)

## 2014-12-20 LAB — GLUCOSE, CAPILLARY: Glucose-Capillary: 87 mg/dL (ref 70–99)

## 2014-12-20 MED ORDER — POTASSIUM CHLORIDE CRYS ER 10 MEQ PO TBCR
30.0000 meq | EXTENDED_RELEASE_TABLET | Freq: Once | ORAL | Status: AC
  Administered 2014-12-20: 30 meq via ORAL
  Filled 2014-12-20 (×2): qty 1

## 2014-12-20 NOTE — Clinical Social Work Note (Signed)
CSW uploaded the current PT/OT notes to [facility], so they can start insurance authorization for the pt. CSW will continue to follow and assist the pt with discharge.   Blessen Kimbrough, MSW, LCSWA [phone]

## 2014-12-20 NOTE — Progress Notes (Signed)
Speech Language Pathology Treatment: Dysphagia  Patient Details Name: Monica Blankenship MRN: [id number] DOB: 09/26/1929 Today's Date: 12/20/2014 Time: 0935-1008 SLP Time Calculation (min) (ACUTE ONLY): 33 min  Assessment / Plan / Recommendation Clinical Impression  Dysphagia treatment provided today for diet tolerance check. The pt demonstrated no overt s/s of aspiration with thin liquids via straw and dysphagia 3 consistency (graham crackers moistened with applesauce). Swallow appeared timely. The pt continues to demonstrate mildly prolonged mastication with this consistency of solid. The pt does continue to have a left sided facial droop. Family in room report that pt is showing improvements with attention and alertness; however she continues to demonstrate decreased sustained/ selective attention- was distracted by multiple conversations in room and changed topic frequently. Pt continues to be at mild-moderate risk of aspiration due to these factors. Recommend continuing with dysphagia 3/ thin liquid diet, meds whole in puree, providing full supervision to cue pt for small bites/ sips and to aide in self-feeding. Pt has met goal for diet tolerance but continues to demonstrate need for speech therapy for cognition and motor speech. Will continue to follow.   HPI HPI: Porchia L Ludlam is a 81 y.o. 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 CXR without signs of acute infection.   Pertinent Vitals Pain Assessment: No/denies pain  SLP Plan  Continue with current plan of care    Recommendations Diet recommendations: Dysphagia 3 (mechanical soft);Thin liquid Liquids provided via: Cup;Straw Medication Administration: Whole meds with puree Supervision: Patient able to  self feed;Full supervision/cueing for compensatory strategies Compensations: Slow rate;Small sips/bites Postural Changes and/or Swallow Maneuvers: Seated upright 90 degrees              Oral Care Recommendations: Oral care BID Follow up Recommendations:  (none for swallowing- rec SNF for continued ST- cog/ speech) Plan: Continue with current plan of care    GO     Oleksiak, Amy K, MA, CCC-SLP 12/20/2014, 10:12 AM    

## 2014-12-20 NOTE — Progress Notes (Signed)
PROGRESS NOTE  JOYCIE AERTS QVZ:[id number] DOB: 1929/06/18 DOA: 12/14/2014 PCP: Georgann Housekeeper, MD  HPI: Blinda Turek is an 81 yo African American female with a PMH of HTN, recent stroke, subacute bacterial endocarditis, rheumatoid arthritis, mitral stenosis, aortic regurgitation, CKD. Pt was in rehab facility post stroke (L sided) and had been refusing meds x 1 week, including antibiotic for UTI. She then came by EMS to the ED on 12/14/2014 with reports of fever, decreased mental status and combative episodes.   Subjective: - Calm this morning, continues to be confused but much more appropriate than yesterday  Assessment/Plan:  Hx of stroke - Patient was recently hospitalized because of the stroke from a 12/14-12/16. Patient was discharged on aspirin and Plavix. She has left-sided weakness and left facial droop, which are most likely from previous stroke. Pt's family reported new combative behavior and depressed mood a few weeks ago in rehab center. - repeat MRI 12/15/2014 showed "Worsening/acute extension of watershed infarctions in the deep white matter of the right cerebral hemisphere when compared to the previous study" (Dec. 2015). - neurology consulted, continue aspirin and plavix  Hypokalemia - replete, recheck in am  SIRS - febrile and tachycardic on admission, no clear source, improving - urine culture negative; however pt received antibiotics prior to hospitalization - blood cultures speciated coagulase negative Staph in 1 of 2 bottles; likely contaminant.  - Repeat blood cultures negative, discontinued antibiotics 1/17 and closely monitor.   Recent history of MSSA bacteremia with endocarditis  - diagnosed in May 2015, completed a course of IV antibiotics, seen by ID as an outpatient most recently in July 2015 - repeat 2D echo 12/15/2014 showed no vegetation on valves - As above, blood cultures show Staph coagulase negative 1/2 bottles, likely a contaminant, repeat  cultures negative  Acute encephalopathy  - unclear etiology, possibly infectious in origin with SIRS vs CVA, improved  Minimal troponin elevation - troponin flat x 3, no chest pain, likely demand due to #1  Elevated BNP - 2D echo reassuring, normal EF, overall stable echo when compared to previous  Hypertension - resume home medications  Hx of rheumatoid arthritis - continue home low dose prednisone, 5 mg daily  DVT Prophylaxis:  Heparin  Code Status: Full Family Communication: none this morning Disposition Plan: Inpatient. (Possible discharge to rehab/SNF next week.)  Consultants:  Neurology  Procedures:  None  Antibiotics:  Cefepime 1/13>>  Vancomycin 1/13>>  Objective: Filed Vitals:   12/20/14 0105 12/20/14 0500 12/20/14 0533 12/20/14 0941  BP: 163/69  172/65 163/68  Pulse: 65  72 71  Temp: 98.2 F (36.8 C)  97.6 F (36.4 C) 98.8 F (37.1 C)  TempSrc: Axillary  Axillary Oral  Resp: 16  16 16   Height:      Weight:  71.26 kg (157 lb 1.6 oz)    SpO2: 96%  98% 97%    Intake/Output Summary (Last 24 hours) at 12/20/14 1440 Last data filed at 12/20/14 0133  Gross per 24 hour  Intake      0 ml  Output    500 ml  Net   -500 ml   Filed Weights   12/16/14 0500 12/17/14 0500 12/20/14 0500  Weight: 75.569 kg (166 lb 9.6 oz) 73.392 kg (161 lb 12.8 oz) 71.26 kg (157 lb 1.6 oz)   Exam: General: NAD HEENT:  Anicteic Sclera Neck: Supple, no JVD, no masses  Cardiovascular: RRR, S1 S2 auscultated, no rubs, murmurs or gallops; pedal pulses 3+ b/l. Respiratory:  Clear to auscultation bilaterally with equal chest rise  Abdomen: Soft, nontender, nondistended, + bowel sounds  Neuro: left sided weakness Skin: no rashes Psych: Normal affect and demeanor with intact judgement and insight  Data Reviewed: Basic Metabolic Panel:  Recent Labs Lab 12/15/14 0645 12/16/14 0312 12/17/14 0645 12/18/14 1010 12/20/14 0600  NA 133* 139 140 142 139  K 3.2* 3.5 3.0* 3.2*  3.3*  CL 101 107 106 109 109  CO2 23 24 21 23 22   GLUCOSE 79 94 116* 134* 85  BUN 6 8 6  <5* 6  CREATININE 0.75 0.75 0.71 0.69 0.75  CALCIUM 8.6 9.2 9.3 9.2 9.6   Liver Function Tests:  Recent Labs Lab 12/14/14 1727 12/15/14 0645 12/16/14 0312 12/17/14 0645  AST 37 33 33 26  ALT 25 20 20 18   ALKPHOS 74 68 68 63  BILITOT 1.5* 1.7* 1.9* 1.5*  PROT 7.4 6.7 6.7 6.9  ALBUMIN 3.7 3.2* 3.1* 3.4*    Recent Labs Lab 12/14/14 2252  LIPASE 36    Recent Labs Lab 12/15/14 1000  AMMONIA 37*   CBC:  Recent Labs Lab 12/14/14 1727 12/15/14 0645 12/18/14 0525 12/20/14 0600  WBC 9.4 9.0 6.8 6.8  NEUTROABS 6.2  --   --   --   HGB 13.8 12.5 11.8* 12.7  HCT 40.7 37.4 35.5* 38.3  MCV 97.4 95.4 96.5 95.8  PLT 166 170 172 240   Cardiac Enzymes:  Recent Labs Lab 12/15/14 0045 12/15/14 0645 12/15/14 1135  TROPONINI 0.06* 0.08* 0.06*   BNP (last 3 results)  Recent Labs  04/08/14 0807 04/09/14 0909  PROBNP 5143.0* 16685.0*   CBG:  Recent Labs Lab 12/17/14 0738 12/18/14 0551 12/19/14 0654 12/20/14 0649  GLUCAP 108* 105* 104* 87    Recent Results (from the past 240 hour(s))  Blood Culture (routine x 2)     Status: None   Collection Time: 12/14/14  5:35 PM  Result Value Ref Range Status   Specimen Description BLOOD LEFT ARM  Final   Special Requests BOTTLES DRAWN AEROBIC AND ANAEROBIC 5CC  Final   Culture   Final    STAPHYLOCOCCUS SPECIES (COAGULASE NEGATIVE) Note: THE SIGNIFICANCE OF ISOLATING THIS ORGANISM FROM A SINGLE SET OF BLOOD CULTURES WHEN MULTIPLE SETS ARE DRAWN IS UNCERTAIN. PLEASE NOTIFY THE MICROBIOLOGY DEPARTMENT WITHIN ONE WEEK IF SPECIATION AND SENSITIVITIES ARE REQUIRED. Note: Gram Stain Report Called to,Read Back By and Verified With: MONICA KARLEN ON 1.13.2016 AT 11:16P BY WILEJ Performed at 12/22/14    Report Status 12/17/2014 FINAL  Final  Blood Culture (routine x 2)     Status: None (Preliminary result)   Collection Time:  12/14/14  5:50 PM  Result Value Ref Range Status   Specimen Description BLOOD RIGHT ARM  Final   Special Requests BOTTLES DRAWN AEROBIC AND ANAEROBIC 5CC  Final   Culture   Final           BLOOD CULTURE RECEIVED NO GROWTH TO DATE CULTURE WILL BE HELD FOR 5 DAYS BEFORE ISSUING A FINAL NEGATIVE REPORT Performed at [facility]    Report Status PENDING  Incomplete  Urine culture     Status: None   Collection Time: 12/14/14  6:24 PM  Result Value Ref Range Status   Specimen Description URINE, CATHETERIZED  Final   Special Requests NONE  Final   Colony Count NO GROWTH Performed at 02/12/15   Final   Culture NO GROWTH Performed at [facility]  Final   Report Status 12/15/2014 FINAL  Final  Culture, blood (routine x 2)     Status: None (Preliminary result)   Collection Time: 12/17/14 10:32 AM  Result Value Ref Range Status   Specimen Description BLOOD RIGHT HAND  Final   Special Requests BOTTLES DRAWN AEROBIC ONLY 3CC  Final   Culture   Final           BLOOD CULTURE RECEIVED NO GROWTH TO DATE CULTURE WILL BE HELD FOR 5 DAYS BEFORE ISSUING A FINAL NEGATIVE REPORT Performed at [facility]    Report Status PENDING  Incomplete  Culture, blood (routine x 2)     Status: None (Preliminary result)   Collection Time: 12/17/14 10:41 AM  Result Value Ref Range Status   Specimen Description BLOOD LEFT HAND  Final   Special Requests BOTTLES DRAWN AEROBIC ONLY 5CC  Final   Culture   Final           BLOOD CULTURE RECEIVED NO GROWTH TO DATE CULTURE WILL BE HELD FOR 5 DAYS BEFORE ISSUING A FINAL NEGATIVE REPORT Performed at [facility]    Report Status PENDING  Incomplete     Studies: No results found.  Scheduled Meds: . aspirin  324 mg Oral Daily  . atorvastatin  80 mg Oral q1800  . cholecalciferol  1,000 Units Oral Daily  . clopidogrel  75 mg Oral Daily  . diclofenac sodium  4 g Topical TID  . doxazosin  8 mg Oral QHS  . feeding  supplement (ENSURE COMPLETE)  237 mL Oral TID BM  . ferrous sulfate  325 mg Oral Q breakfast  . folic acid  1 mg Oral Daily  . heparin  5,000 Units Subcutaneous 3 times per day  . hydrALAZINE  75 mg Oral TID  . pantoprazole  40 mg Oral Daily  . predniSONE  5 mg Oral Q breakfast  . sodium chloride  3 mL Intravenous Q12H  . verapamil  120 mg Oral Daily   Continuous Infusions:   Principal Problem:   Encephalopathy acute Active Problems:   Mitral stenosis   Aortic stenosis   Aortic regurgitation   Chronic diastolic CHF (congestive heart failure)   Pulmonary HTN   HTN (hypertension)   Stroke   Hypertension   Acute encephalopathy   Hypokalemia    Hipolito Martinezlopez M. Elvera Lennox, MD [facility] [phone] [facility] Pager [phone]. If 7PM-7AM, please contact night-coverage at [email or web address], password [facility] 12/20/2014, 2:40 PM  LOS: 6 days

## 2014-12-20 NOTE — Progress Notes (Signed)
UR COMPLETED  

## 2014-12-20 NOTE — Plan of Care (Signed)
Problem: SLP Language Goals Goal: Misc Speech Goal #1 Pt will utilize strategies to increase speech intelligibility to 90% in conversation.

## 2014-12-20 NOTE — Progress Notes (Signed)
Physical Therapy Treatment Patient Details Name: MICHELYN SCULLIN MRN: [id number] DOB: 09-19-1929 Today's Date: 12/20/2014    History of Present Illness AZELEA SEGUIN is a 81 y.o. 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 Patient was recently hospitalized from 12/14 to 12/16 because of stroke. She was discharged to the nursing home for rehabilitation. In the past several days, patient became confused. She refused taking her medications in last week, including antibiotics for UTI. When I evaluated patient in emergency room, patient looks comfortable in bed. She is alert, but not oriented 3. Patient seems not to have any pain in anywhere. She answered some questions, states that she does not have chest pain, abdominal pain, shortness of breath.     PT Comments    Patient agreeable to work with therapy however continues to have L side decifits that limited weight shifting and ability to stand upright even with +2 assist. Patient with decreased awareness of deficits stating "i can stand". Continue to recommend SNF for ongoing Physical Therapy.     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  She did initiate  movement of LEs and was able to reach across for bed rail.   Transfers Overall transfer level: Needs assistance Equipment used: None   Sit to Stand: +2 physical assistance;Mod assist   Squat pivot transfers: +2 physical assistance;Max assist     General transfer comment: Patient continues to require +2 max assist for transfer as she continues with inability to take weight shifting on L and advacne R. She was able to slide R LE over some with transferring to R side. Still unable to stand fully upright for SPT.   Ambulation/Gait             General Gait Details: unable to attempt   Stairs            Wheelchair Mobility    Modified Rankin (Stroke Patients Only)       Balance Overall balance assessment: Needs assistance   Sitting balance-Leahy Scale: Poor Sitting balance - Comments: Pt requires mod to close S for static sitting due to LOB to the L. Able to correct with some cueing but still has slight L lean Postural control: Left lateral lean   Standing balance-Leahy Scale: Zero                      Cognition Arousal/Alertness: Awake/alert Behavior During Therapy: WFL for tasks assessed/performed Overall Cognitive Status: Impaired/Different from baseline Area of Impairment: Attention;Memory;Safety/judgement;Following commands;Awareness;Problem solving;Orientation Orientation Level: Disoriented to;Time;Situation Current Attention Level: Sustained Memory: Decreased short-term memory Following Commands: Follows one step commands inconsistently Safety/Judgement: Decreased awareness of safety;Decreased awareness of deficits Awareness: Intellectual Problem Solving: Slow processing;Requires verbal cues;Requires tactile cues  Exercises Shoulder Exercises Shoulder Flexion: PROM;AROM;Both;10 reps;Supine;Seated Shoulder Extension: PROM;AROM;Both;10 reps;Supine;Seated Elbow Flexion: PROM;AROM;Both;10 reps;Supine;Seated Elbow Extension: PROM;AROM;Both;10  reps;Supine;Seated Wrist Flexion: PROM;Left;10 reps;Supine;Seated Wrist Extension: PROM;Left;10 reps;Supine;Seated Digit Composite Flexion: PROM;Left;10 reps;Supine;Seated Composite Extension: PROM;Left;10 reps;Seated;Supine    General Comments        Pertinent Vitals/Pain Pain Assessment: No/denies pain    Home Living                      Prior Function            PT Goals (current goals can now be found in the care plan section)      Frequency  Min 2X/week    PT Plan Current plan remains appropriate;Frequency needs to be updated    Co-evaluation             End of Session Equipment Utilized During Treatment: Gait belt Activity Tolerance: Patient tolerated treatment well Patient left: in chair;with call bell/phone within reach;with family/visitor present     Time: 8938-1017 PT Time Calculation (min) (ACUTE ONLY): 25 min  Charges:  $Therapeutic Activity: 23-37 mins                    G Codes:      Fredrich Birks 12/20/2014, 11:55 AM  12/20/2014 Fredrich Birks PTA [phone] pager [phone] office

## 2014-12-20 NOTE — Progress Notes (Signed)
Bladder scan showed , MD ordered in and out cath. was collected from in & out. Will continue to monitor.

## 2014-12-20 NOTE — Progress Notes (Signed)
Occupational Therapy Treatment Patient Details Name: Monica Blankenship MRN: [id number] DOB: Nov 12, 1929 Today's Date: 12/20/2014    History of present illness Monica Blankenship is a 81 y.o. 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 Patient was recently hospitalized from 12/14 to 12/16 because of stroke. She was discharged to the nursing home for rehabilitation. In the past several days, patient became confused. She refused taking her medications in last week, including antibiotics for UTI. When I evaluated patient in emergency room, patient looks comfortable in bed. She is alert, but not oriented 3. Patient seems not to have any pain in anywhere. She answered some questions, states that she does not have chest pain, abdominal pain, shortness of breath.    OT comments  Pt. In good spirits this am and agreeable to participation in skilled OT.   education provided to daughters on LUE placement for edema management and also encouraging pt. To attempt familiar tasks without assist to increase participation and independence. Pt. 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 plates, total A for washing L hand                                General ADL Comments: pt. in better spirits this session than previous documentation smiling and actively participating in conversation.  participating in grooming tasks and ue exercises                                      Cognition   Behavior During Therapy: [facility] for tasks assessed/performed Overall Cognitive Status: Impaired/Different from baseline Area of Impairment: Attention;Memory;Safety/judgement;Following commands;Awareness;Problem solving;Orientation Orientation Level: Disoriented to;Time;Situation Current Attention Level: Sustained Memory: Decreased short-term memory  Following Commands: Follows one step commands inconsistently Safety/Judgement: Decreased awareness of safety;Decreased awareness of deficits Awareness: Intellectual Problem Solving: Slow processing;Requires verbal cues;Requires tactile cues                    Exercises Shoulder Exercises Shoulder Flexion: PROM;AROM;Both;10 reps;Supine;Seated Shoulder Extension: PROM;AROM;Both;10 reps;Supine;Seated Elbow Flexion: PROM;AROM;Both;10 reps;Supine;Seated Elbow Extension: PROM;AROM;Both;10 reps;Supine;Seated Wrist Flexion: PROM;Left;10 reps;Supine;Seated Wrist Extension: PROM;Left;10 reps;Supine;Seated Digit Composite Flexion: PROM;Left;10 reps;Supine;Seated Composite Extension: PROM;Left;10 reps;Seated;Supine                Pertinent Vitals/ Pain       Pain Assessment: No/denies pain  Frequency Min 2X/week     Progress Toward Goals  OT Goals(current goals can now be found in the care plan section)  Progress towards OT goals: Progressing toward goals     Plan Discharge plan remains appropriate                     End of Session     Activity Tolerance Patient tolerated treatment well   Patient Left in bed;with call bell/phone within reach;with bed alarm set;with family/visitor  present             Time: 8309-4076 OT Time Calculation (min): 31 min  Charges: OT General Charges $OT Visit: 1 Procedure OT Treatments $Self Care/Home Management : 8-22 mins $Therapeutic Exercise: 8-22 mins  Robet Leu, COTA/L 12/20/2014, 8:43 AM

## 2014-12-21 DIAGNOSIS — R339 Retention of urine, unspecified: Secondary | ICD-10-CM

## 2014-12-21 LAB — CULTURE, BLOOD (ROUTINE X 2): Culture: NO GROWTH

## 2014-12-21 LAB — GLUCOSE, CAPILLARY: Glucose-Capillary: 93 mg/dL (ref 70–99)

## 2014-12-21 LAB — POTASSIUM: POTASSIUM: 3.5 mmol/L (ref 3.5–5.1)

## 2014-12-21 MED ORDER — LORAZEPAM 2 MG/ML IJ SOLN
2.0000 mg | Freq: Once | INTRAMUSCULAR | Status: DC
Start: 2014-12-21 — End: 2014-12-22

## 2014-12-21 MED ORDER — LORAZEPAM 2 MG/ML IJ SOLN
INTRAMUSCULAR | Status: AC
  Filled 2014-12-21: qty 1

## 2014-12-21 MED ORDER — LISINOPRIL 20 MG PO TABS
20.0000 mg | ORAL_TABLET | Freq: Every day | ORAL | Status: DC
  Administered 2014-12-22: 20 mg via ORAL
  Filled 2014-12-21: qty 1

## 2014-12-21 MED ORDER — LISINOPRIL 10 MG PO TABS: 10.0000 mg | ORAL_TABLET | Freq: Every day | ORAL | Status: DC

## 2014-12-21 MED ORDER — LORAZEPAM 2 MG/ML IJ SOLN
1.0000 mg | Freq: Once | INTRAMUSCULAR | Status: AC
  Administered 2014-12-21: 1 mg via INTRAVENOUS
  Filled 2014-12-21: qty 1

## 2014-12-21 MED ORDER — SODIUM CHLORIDE 0.9 % IV SOLN
INTRAVENOUS | Status: DC
  Administered 2014-12-21: 18:00:00 via INTRAVENOUS

## 2014-12-21 MED ORDER — SODIUM CHLORIDE 0.9 % IV BOLUS (SEPSIS)
500.0000 mL | Freq: Once | INTRAVENOUS | Status: AC
  Administered 2014-12-21: 500 mL via INTRAVENOUS

## 2014-12-21 NOTE — Progress Notes (Signed)
Pt refused vital signs to be taken. Pt also refuses PO fluid intake.   IV fluids infusing. Pt has not yet voided. Will monitor   Andrew Au I 12/21/2014 4:29 PM

## 2014-12-21 NOTE — Progress Notes (Signed)
PROGRESS NOTE  Monica Blankenship NLZ:[id number] DOB: [birth year] DOA: 12/14/2014 PCP: Georgann Housekeeper, MD  HPI: Monica Blankenship is an 81 yo African American female with a PMH of HTN, recent stroke, subacute bacterial endocarditis, rheumatoid arthritis, mitral stenosis, aortic regurgitation, CKD. Pt was in rehab facility post stroke (L sided) and had been refusing meds x 1 week, including antibiotic for UTI. She then came by EMS to the ED on 12/14/2014 with reports of fever, decreased mental status and combative episodes.   Interim history: - patient was admitted with altered mental status and combativeness, was febrile and previously treated with antibiotics at SNF. She was started on broad spectrum antibiotics with Vancomycin and Cefepime on admission, also underwent MRI of the brain which showed extension of her previous strokes and neurology saw and evaluated patient while hospitalized. Her blood cultures returned 1/2 bottles GPC which eventually speciated to coagulase negative staph, and her antibiotics were discontinued. Of note, has a history of MSSA endocarditis in 2015. Planned for discharge on 1/19 however developed acute urinary retention. Her other medical problems are stable and can be d/c to SNF when retention improves.   Subjective: - Calm this morning, continues to be confused  Assessment/Plan:  Acute urinary retention - problem not completely new, she is on Cardura, likely to be medication induced, hydralazine can cause that, discontinue hydralazine and start Lisinopril - check UA to evaluate for infection  Hx of stroke - stable - Patient was recently hospitalized because of the stroke from a 12/14-12/16. Patient was discharged on aspirin and Plavix. She has left-sided weakness and left facial droop, which are most likely from previous stroke. Pt's family reported new combative behavior and depressed mood a few weeks ago in rehab center. - repeat MRI 12/15/2014 showed  "Worsening/acute extension of watershed infarctions in the deep white matter of the right cerebral hemisphere when compared to the previous study" (Dec. 2015). - neurology consulted, continue aspirin and plavix  Hypokalemia - replete, recheck in am  SIRS - febrile and tachycardic on admission, no clear source, improving - urine culture negative; however pt received antibiotics prior to hospitalization - blood cultures speciated coagulase negative Staph in 1 of 2 bottles; likely contaminant.  - Repeat blood cultures negative, discontinued antibiotics 1/17  Recent history of MSSA bacteremia with endocarditis  - diagnosed in May 2015, completed a course of IV antibiotics, seen by ID as an outpatient most recently in July 2015 - repeat 2D echo 12/15/2014 showed no vegetation on valves - As above, blood cultures show Staph coagulase negative 1/2 bottles, likely a contaminant, repeat cultures negative  Acute encephalopathy - unclear etiology, possibly infectious in origin with SIRS vs CVA, improved  Minimal troponin elevation - troponin flat x 3, no chest pain, likely demand due to #1  Elevated BNP - 2D echo reassuring, normal EF, overall stable echo when compared to previous  Hypertension - resume home medications  Hx of rheumatoid arthritis - continue home low dose prednisone, 5 mg daily  DVT Prophylaxis:  Heparin  Code Status: Full Family Communication: none this morning Disposition Plan: Inpatient, SNF when ready   Consultants:  Neurology  Procedures:  None  Antibiotics:  Cefepime 1/13>>  Vancomycin 1/13>>  Objective: Filed Vitals:   12/21/14 0200 12/21/14 0500 12/21/14 0549 12/21/14 0950  BP: 132/55  124/50 138/59  Pulse: 76  69 66  Temp: 98.2 F (36.8 C)  97.9 F (36.6 C) 98.3 F (36.8 C)  TempSrc: Oral  Axillary Oral  Resp:  16  16 18   Height:      Weight:  72.4 kg (159 lb 9.8 oz)    SpO2: 98%  98% 97%    Intake/Output Summary (Last 24 hours) at 12/21/14  1345 Last data filed at 12/21/14 1000  Gross per 24 hour  Intake     80 ml  Output   1000 ml  Net   -920 ml   Filed Weights   12/17/14 0500 12/20/14 0500 12/21/14 0500  Weight: 73.392 kg (161 lb 12.8 oz) 71.26 kg (157 lb 1.6 oz) 72.4 kg (159 lb 9.8 oz)   Exam: General: NAD HEENT:  Anicteic Sclera Neck: Supple, no JVD, no masses  Cardiovascular: RRR, S1 S2 auscultated, no rubs, murmurs or gallops; pedal pulses 3+ b/l. Respiratory: Clear to auscultation bilaterally with equal chest rise  Abdomen: Soft, nontender, nondistended, + bowel sounds  Neuro: left sided weakness Skin: no rashes Psych: Normal affect and demeanor with intact judgement and insight  Data Reviewed: Basic Metabolic Panel:  Recent Labs Lab 12/15/14 0645 12/16/14 0312 12/17/14 0645 12/18/14 1010 12/20/14 0600 12/21/14 0545  NA 133* 139 140 142 139  --   K 3.2* 3.5 3.0* 3.2* 3.3* 3.5  CL 101 107 106 109 109  --   CO2 23 24 21 23 22   --   GLUCOSE 79 94 116* 134* 85  --   BUN 6 8 6  <5* 6  --   CREATININE 0.75 0.75 0.71 0.69 0.75  --   CALCIUM 8.6 9.2 9.3 9.2 9.6  --    Liver Function Tests:  Recent Labs Lab 12/14/14 1727 12/15/14 0645 12/16/14 0312 12/17/14 0645  AST 37 33 33 26  ALT 25 20 20 18   ALKPHOS 74 68 68 63  BILITOT 1.5* 1.7* 1.9* 1.5*  PROT 7.4 6.7 6.7 6.9  ALBUMIN 3.7 3.2* 3.1* 3.4*    Recent Labs Lab 12/14/14 2252  LIPASE 36    Recent Labs Lab 12/15/14 1000  AMMONIA 37*   CBC:  Recent Labs Lab 12/14/14 1727 12/15/14 0645 12/18/14 0525 12/20/14 0600  WBC 9.4 9.0 6.8 6.8  NEUTROABS 6.2  --   --   --   HGB 13.8 12.5 11.8* 12.7  HCT 40.7 37.4 35.5* 38.3  MCV 97.4 95.4 96.5 95.8  PLT 166 170 172 240   Cardiac Enzymes:  Recent Labs Lab 12/15/14 0045 12/15/14 0645 12/15/14 1135  TROPONINI 0.06* 0.08* 0.06*   BNP (last 3 results)  Recent Labs  04/08/14 0807 04/09/14 0909  PROBNP 5143.0* 16685.0*   CBG:  Recent Labs Lab 12/17/14 0738 12/18/14 0551  12/19/14 0654 12/20/14 0649 12/21/14 0649  GLUCAP 108* 105* 104* 87 93    Recent Results (from the past 240 hour(s))  Blood Culture (routine x 2)     Status: None   Collection Time: 12/14/14  5:35 PM  Result Value Ref Range Status   Specimen Description BLOOD LEFT ARM  Final   Special Requests BOTTLES DRAWN AEROBIC AND ANAEROBIC 5CC  Final   Culture   Final    STAPHYLOCOCCUS SPECIES (COAGULASE NEGATIVE) Note: THE SIGNIFICANCE OF ISOLATING THIS ORGANISM FROM A SINGLE SET OF BLOOD CULTURES WHEN MULTIPLE SETS ARE DRAWN IS UNCERTAIN. PLEASE NOTIFY THE MICROBIOLOGY DEPARTMENT WITHIN ONE WEEK IF SPECIATION AND SENSITIVITIES ARE REQUIRED. Note: Gram Stain Report Called to,Read Back By and Verified With: MONICA KARLEN ON 1.13.2016 AT 11:16P BY WILEJ Performed at 12/22/14    Report Status 12/17/2014 FINAL  Final  Blood Culture (routine x 2)     Status: None   Collection Time: 12/14/14  5:50 PM  Result Value Ref Range Status   Specimen Description BLOOD RIGHT ARM  Final   Special Requests BOTTLES DRAWN AEROBIC AND ANAEROBIC 5CC  Final   Culture   Final    NO GROWTH 5 DAYS Performed at [facility]    Report Status 12/21/2014 FINAL  Final  Urine culture     Status: None   Collection Time: 12/14/14  6:24 PM  Result Value Ref Range Status   Specimen Description URINE, CATHETERIZED  Final   Special Requests NONE  Final   Colony Count NO GROWTH Performed at [facility]   Final   Culture NO GROWTH Performed at [facility]   Final   Report Status 12/15/2014 FINAL  Final  Culture, blood (routine x 2)     Status: None (Preliminary result)   Collection Time: 12/17/14 10:32 AM  Result Value Ref Range Status   Specimen Description BLOOD RIGHT HAND  Final   Special Requests BOTTLES DRAWN AEROBIC ONLY 3CC  Final   Culture   Final           BLOOD CULTURE RECEIVED NO GROWTH TO DATE CULTURE WILL BE HELD FOR 5 DAYS BEFORE ISSUING A FINAL NEGATIVE  REPORT Performed at [facility]    Report Status PENDING  Incomplete  Culture, blood (routine x 2)     Status: None (Preliminary result)   Collection Time: 12/17/14 10:41 AM  Result Value Ref Range Status   Specimen Description BLOOD LEFT HAND  Final   Special Requests BOTTLES DRAWN AEROBIC ONLY 5CC  Final   Culture   Final           BLOOD CULTURE RECEIVED NO GROWTH TO DATE CULTURE WILL BE HELD FOR 5 DAYS BEFORE ISSUING A FINAL NEGATIVE REPORT Performed at [facility]    Report Status PENDING  Incomplete     Studies: No results found.  Scheduled Meds: . aspirin  324 mg Oral Daily  . atorvastatin  80 mg Oral q1800  . cholecalciferol  1,000 Units Oral Daily  . clopidogrel  75 mg Oral Daily  . diclofenac sodium  4 g Topical TID  . doxazosin  8 mg Oral QHS  . feeding supplement (ENSURE COMPLETE)  237 mL Oral TID BM  . ferrous sulfate  325 mg Oral Q breakfast  . folic acid  1 mg Oral Daily  . heparin  5,000 Units Subcutaneous 3 times per day  . [START ON 12/22/2014] lisinopril  20 mg Oral Daily  . LORazepam  2 mg Intravenous Once  . pantoprazole  40 mg Oral Daily  . predniSONE  5 mg Oral Q breakfast  . sodium chloride  3 mL Intravenous Q12H  . verapamil  120 mg Oral Daily   Continuous Infusions:   Principal Problem:   Encephalopathy acute Active Problems:   Mitral stenosis   Aortic stenosis   Aortic regurgitation   Chronic diastolic CHF (congestive heart failure)   Pulmonary HTN   HTN (hypertension)   Stroke   Hypertension   Acute encephalopathy   Hypokalemia    Aniket Paye M. Elvera Lennox, MD [facility] [phone] [facility] Pager [phone]. If 7PM-7AM, please contact night-coverage at [email or web address], password [facility] 12/21/2014, 1:45 PM  LOS: 7 days

## 2014-12-21 NOTE — Progress Notes (Signed)
Nurse notified MD, Dr. Elvera Lennox that bladder scan revealed 56 cc. pt has not voided. Will monitor.   Andrew Au I 12/21/2014 1:52 PM

## 2014-12-21 NOTE — Progress Notes (Signed)
Md returned call and advised to continue monitoring and will start pt on IV fluids. Will monitor   Monica Blankenship I 12/21/2014 6:05 PM

## 2014-12-21 NOTE — Progress Notes (Signed)
Nurse notified MD, Dr. Elvera Lennox that pt has not yet voided despite 500 cc NS bolus. bladder scan volume 254cc. Will monitor for MD's return call/orders. Will continue to monitor pt closely.   Andrew Au I 12/21/2014 5:42 PM

## 2014-12-22 LAB — BASIC METABOLIC PANEL
Anion gap: 10 (ref 5–15)
BUN: 7 mg/dL (ref 6–23)
CALCIUM: 9.4 mg/dL (ref 8.4–10.5)
CHLORIDE: 108 meq/L (ref 96–112)
CO2: 24 mmol/L (ref 19–32)
Creatinine, Ser: 0.73 mg/dL (ref 0.50–1.10)
GFR calc Af Amer: 89 mL/min — ABNORMAL LOW (ref >90)
GFR, EST NON AFRICAN AMERICAN: 77 mL/min — AB (ref >90)
Glucose, Bld: 97 mg/dL (ref 70–99)
Potassium: 3.5 mmol/L (ref 3.5–5.1)
Sodium: 142 mmol/L (ref 135–145)

## 2014-12-22 LAB — GLUCOSE, CAPILLARY: GLUCOSE-CAPILLARY: 89 mg/dL (ref 70–99)

## 2014-12-22 LAB — CBC
HCT: 36.8 % (ref 36.0–46.0)
HEMOGLOBIN: 12.1 g/dL (ref 12.0–15.0)
MCH: 31.8 pg (ref 26.0–34.0)
MCHC: 32.9 g/dL (ref 30.0–36.0)
MCV: 96.8 fL (ref 78.0–100.0)
Platelets: 228 10*3/uL (ref 150–400)
RBC: 3.8 MIL/uL — ABNORMAL LOW (ref 3.87–5.11)
RDW: 15.6 % — ABNORMAL HIGH (ref 11.5–15.5)
WBC: 7.1 10*3/uL (ref 4.0–10.5)

## 2014-12-22 MED ORDER — ALPRAZOLAM 0.25 MG PO TABS: 0.2500 mg | ORAL_TABLET | Freq: Two times a day (BID) | ORAL | Status: DC | PRN

## 2014-12-22 MED ORDER — TRAMADOL HCL 50 MG PO TABS: 50.0000 mg | ORAL_TABLET | Freq: Four times a day (QID) | ORAL | Status: DC | PRN

## 2014-12-22 NOTE — Discharge Instructions (Signed)
Follow with Primary MD Georgann Housekeeper, MD in 7 days   Get CBC, CMP, 2 view Chest X ray checked  by Primary MD next visit.    Activity: As tolerated with Full fall precautions use walker/cane & assistance as needed   Disposition SNF   Diet: Dysphagia 3, with feeding assistance and aspiration precautions as needed.  For Heart failure patients - Check your Weight same time everyday, if you gain over 2 pounds, or you develop in leg swelling, experience more shortness of breath or chest pain, call your Primary MD immediately. Follow Cardiac Low Salt Diet and 1.8 lit/day fluid restriction.   On your next visit with your primary care physician please Get Medicines reviewed and adjusted.   Please request your Prim.MD to go over all Hospital Tests and Procedure/Radiological results at the follow up, please get all Hospital records sent to your Prim MD by signing hospital release before you go home.   If you experience worsening of your admission symptoms, develop shortness of breath, life threatening emergency, suicidal or homicidal thoughts you must seek medical attention immediately by calling 911 or calling your MD immediately  if symptoms less severe.  You Must read complete instructions/literature along with all the possible adverse reactions/side effects for all the Medicines you take and that have been prescribed to you. Take any new Medicines after you have completely understood and accpet all the possible adverse reactions/side effects.   Do not drive, operating heavy machinery, perform activities at heights, swimming or participation in water activities or provide baby sitting services if your were admitted for syncope or siezures until you have seen by Primary MD or a Neurologist and advised to do so again.  Do not drive when taking Pain medications.    Do not take more than prescribed Pain, Sleep and Anxiety Medications  Special Instructions: If you have smoked or chewed Tobacco   in the last 2 yrs please stop smoking, stop any regular Alcohol  and or any Recreational drug use.  Wear Seat belts while driving.   Please note  You were cared for by a hospitalist during your hospital stay. If you have any questions about your discharge medications or the care you received while you were in the hospital after you are discharged, you can call the unit and asked to speak with the hospitalist on call if the hospitalist that took care of you is not available. Once you are discharged, your primary care physician will handle any further medical issues. Please note that NO REFILLS for any discharge medications will be authorized once you are discharged, as it is imperative that you return to your primary care physician (or establish a relationship with a primary care physician if you do not have one) for your aftercare needs so that they can reassess your need for medications and monitor your lab values.

## 2014-12-22 NOTE — Clinical Social Work Note (Signed)
CSW spoke with the pt's daughter [name]. CSW introduce self and purpose of call. CSW informed the [name] that the pt will be discharge back to [facility] today. CSW and [name] discussed ambulance transport. CSW contact [facility] at [phone] to schedule transport for the pt. CSW upload the pt's discharge summary. Bedside RN can call reported to [phone] room 210.   Aras Albarran, MSW, LCSWA [phone]

## 2014-12-22 NOTE — Progress Notes (Addendum)
Pt voiding without difficulty.  Bladder scan at 1000 am for 89.  Will continue to monitor. Sondra Come, RN

## 2014-12-22 NOTE — Discharge Summary (Signed)
Monica Blankenship, 81 y.o., DOB 1929-07-12, MRN [id number]. Admission date: 12/14/2014 Discharge Date 12/22/2014 Primary MD Georgann Housekeeper, MD Admitting Physician Lorretta Harp, MD  Admission Diagnosis  uti  Discharge Diagnosis   Principal Problem:   Encephalopathy acute Active Problems:   Mitral stenosis   Aortic stenosis   Aortic regurgitation   Chronic diastolic CHF (congestive heart failure)   Pulmonary HTN   HTN (hypertension)   Stroke   Hypertension   Acute encephalopathy   Hypokalemia     PCP please follow-up on: - Check CBC, BMP during next visit - Please monitor closely for urinary retention symptoms  Past Medical History  Diagnosis Date  . Allergy   . Anxiety   . Insomnia   . Osteoarthritis   . Anemia   . Obesity   . Diastolic dysfunction   . CKD (chronic kidney disease)   . Mitral stenosis     with moderate MR on echo 4/14  . Aortic stenosis     mild by echo 4/14  . Aortic regurgitation     mild to moderate by echo 4/14  . HTN (hypertension)     Past Surgical History  Procedure Laterality Date  . Eye surgery    . Abdominal hysterectomy    . Appendectomy    . Cataract extraction, bilateral    . Tee without cardioversion N/A 04/13/2014    Procedure: TRANSESOPHAGEAL ECHOCARDIOGRAM (TEE);  Surgeon: Vesta Mixer, MD;  Location: [facility] ENDOSCOPY;  Service: Cardiovascular;  Laterality: N/A;     Hospital Course See H&P, Labs, Consult and Test reports for all details in brief, patient was admitted for **  Principal Problem:   Encephalopathy acute Active Problems:   Mitral stenosis   Aortic stenosis   Aortic regurgitation   Chronic diastolic CHF (congestive heart failure)   Pulmonary HTN   HTN (hypertension)   Stroke   Hypertension   Acute encephalopathy   Hypokalemia   HPI: Monica Blankenship is an 81 yo [place] American female with a PMH of HTN, recent stroke, subacute bacterial endocarditis, rheumatoid arthritis, mitral stenosis, aortic regurgitation,  CKD. Pt was in rehab facility post stroke (L sided) and had been refusing meds x 1 week, including antibiotic for UTI. She then came by EMS to the ED on 12/14/2014 with reports of fever, decreased mental status and combative episodes.   Interim history: - patient was admitted with altered mental status and combativeness, was febrile and previously treated with antibiotics at SNF. She was started on broad spectrum antibiotics with Vancomycin and Cefepime on admission, also underwent MRI of the brain which showed extension of her previous strokes and neurology saw and evaluated patient while hospitalized. Her blood cultures returned 1/2 bottles GPC which eventually speciated to coagulase negative staph, and her antibiotics were discontinued. Of note, has a history of MSSA endocarditis in 2015. Planned for discharge on 1/19 however developed acute urinary retention. She was kept one more day for observation, no further episodes of retention, bladder scan done at day of discharge showing no evidence of retention.   Hx of stroke - stable - Patient was recently hospitalized because of the stroke from a 12/14-12/16. Patient was discharged on aspirin and Plavix. She has left-sided weakness and left facial droop, which are most likely from previous stroke. Pt's family reported new combative behavior and depressed mood a few weeks ago in rehab center. - repeat MRI 12/15/2014 showed "Worsening/acute extension of watershed infarctions in the deep white matter of the right cerebral hemisphere  when compared to the previous study" (Dec. 2015). - neurology consulted, continue aspirin and plavix   Acute urinary retention - problem not completely new, she is on Cardura, resolved.  Hypokalemia Repleted , continue with potassium supplement as outpatient .  SIRS - febrile and tachycardic on admission, no clear sourcresolved  - urine culture negative; however pt received antibiotics prior to hospitalization - blood  cultures speciated coagulase negative Staph in 1 of 2 bottles; likely contaminant.  - Repeat blood cultures negative, discontinued antibiotics 1/17  Recent history of MSSA bacteremia with endocarditis  - diagnosed in May 2015, completed a course of IV antibiotics, seen by ID as an outpatient most recently in July 2015 - repeat 2D echo 12/15/2014 showed no vegetation on valves - As above, blood cultures show Staph coagulase negative 1/2 bottles, likely a contaminant, repeat cultures negative  Acute encephalopathy - unclear etiology, possibly infectious in origin with SIRS vs CVA, improved  Minimal troponin elevation - troponin flat x 3, no chest pain, likely demand due to #1  Elevated BNP - 2D echo reassuring, normal EF, overall stable echo when compared to previous  Hypertension - resume home medications  Hx of rheumatoid arthritis - continue home low dose prednisone, 5 mg daily    Code Status: Full   Consultants:  Neurology  Procedures:  None  Antibiotics:  Cefepime 1/13>> 1/17  Vancomycin 1/13>> 1/17    Significant Tests:  See full reports for all details    Ct Head Wo Contrast  12/14/2014   CLINICAL DATA:  Post stroke with LEFT-sided deficits, patient has been refusing medications for 1 week, now with decrease mental status an episodes of being combative, history hypertension, diastolic dysfunction, former smoker  EXAM: CT HEAD WITHOUT CONTRAST  TECHNIQUE: Contiguous axial images were obtained from the base of the skull through the vertex without intravenous contrast.  COMPARISON:  11/15/2014  FINDINGS: Mild generalized atrophy.  Normal ventricular morphology.  No midline shift or mass effect.  Small vessel chronic ischemic changes of deep cerebral white matter.  Old RIGHT frontal infarct.  No intracranial hemorrhage, mass lesion, or evidence acute infarction.  No extra-axial fluid collections.  Atherosclerotic calcifications of the carotid siphons.  Hyperostosis  frontalis interna.  No acute bone or sinus abnormalities.  IMPRESSION: Atrophy with small vessel chronic ischemic changes of deep cerebral white matter.  Old RIGHT frontal infarct.  No acute intracranial abnormalities.   Electronically Signed   By: Ulyses Southward M.D.   On: 12/14/2014 21:13   Ct Angio Chest Pe W/cm &/or Wo Cm  12/15/2014   CLINICAL DATA:  Acute onset of altered mental status and tachycardia. Elevated D-dimer. Initial encounter.  EXAM: CT ANGIOGRAPHY CHEST WITH CONTRAST  TECHNIQUE: Multidetector CT imaging of the chest was performed using the standard protocol during bolus administration of intravenous contrast. Multiplanar CT image reconstructions and MIPs were obtained to evaluate the vascular anatomy.  CONTRAST:  100 mL of Omnipaque 350 IV contrast  COMPARISON:  Chest radiograph performed 12/14/2014  FINDINGS: There is no evidence of pulmonary embolus.  Mild interstitial prominence may be chronic in nature. A few blebs are noted in the right lung base. The lungs are otherwise clear. There is no evidence of significant focal consolidation, pleural effusion or pneumothorax. No masses are identified; no abnormal focal contrast enhancement is seen.  Diffuse coronary artery calcifications are noted. The mediastinum is otherwise unremarkable. A tiny hiatal hernia is noted. Scattered calcification is seen along the aortic arch and proximal great  vessels, with likely mild to moderate luminal narrowing at the origin of the left subclavian artery, and dense calcification at the origin of the right vertebral artery. No pericardial effusion is seen. No mediastinal lymphadenopathy is appreciated. No axillary lymphadenopathy is seen. The visualized portions of the thyroid gland are unremarkable in appearance.  The visualized portions of the liver and spleen are unremarkable. Nonspecific bilateral perinephric stranding is noted. The visualized portions of the pancreas, gallbladder, stomach and adrenal glands are  within normal limits. Reflux of contrast into the hepatic veins and IVC may reflect some degree of right heart insufficiency.  No acute osseous abnormalities are seen.  Review of the MIP images confirms the above findings.  IMPRESSION: 1. No evidence of pulmonary embolus. 2. Mild interstitial prominence may be chronic in nature. Few blebs at the right lung base. Lungs otherwise clear. 3. Diffuse coronary artery calcifications noted. 4. Tiny hiatal hernia seen. 5. Likely mild to moderate luminal narrowing at the origin of the left subclavian artery, and dense calcification at the origin of the right vertebral artery. 6. Reflux of contrast into the hepatic veins and IVC may reflect some degree of right heart insufficiency.   Electronically Signed   By: Roanna Raider M.D.   On: 12/15/2014 02:24   Mr Laqueta Jean SV Contrast  12/15/2014   CLINICAL DATA:  Hypertension. Recent stroke. Bacterial endocarditis. Fever and worsened mental status.  EXAM: MRI HEAD WITHOUT AND WITH CONTRAST  TECHNIQUE: Multiplanar, multiecho pulse sequences of the brain and surrounding structures were obtained without and with intravenous contrast.  CONTRAST:  56mL MULTIHANCE GADOBENATE DIMEGLUMINE 529 MG/ML IV SOLN  COMPARISON:  Head CT 12/14/2014.  MRI 11/15/2014.  FINDINGS: There has been progression of acute watershed infarctions within the right hemisphere primarily affecting the deep white matter. This is most notable in the frontal parietal deep white matter. No acute infarction affecting the brainstem, cerebellum or left cerebral hemisphere. No hemorrhage or change in midline position.  Background pattern of extensive chronic small vessel disease remains evident affecting both cerebral hemispheres. Old bilateral frontal cortical and subcortical infarction appears the same.  No evidence of neoplastic mass lesion, hydrocephalus or extra-axial collection. After contrast administration, no abnormal enhancement occurs.  IMPRESSION:  Worsening/acute extension of watershed infarctions in the deep white matter of the right cerebral hemisphere when compared to the previous study.   Electronically Signed   By: Paulina Fusi M.D.   On: 12/15/2014 18:32   Dg Chest Port 1 View  12/14/2014   CLINICAL DATA:  Urinary tract infection, decreasing mental status for 1 week  EXAM: PORTABLE CHEST - 1 VIEW  COMPARISON:  11/16/2014  FINDINGS: Borderline cardiomegaly. No acute infiltrate or pulmonary edema. Atherosclerotic calcifications of thoracic aorta mild degenerative changes bilateral shoulders.  IMPRESSION: Borderline cardiomegaly.  No active disease.   Electronically Signed   By: Natasha Mead M.D.   On: 12/14/2014 19:33     Today   Subjective:   Monica Blankenship today has no headache,no chest abdominal pain,no new weakness tingling or numbness, feels much better .  Objective:   Blood pressure 130/50, pulse 94, temperature 98.5 F (36.9 C), temperature source Oral, resp. rate 20, height 5\' 2"  (1.575 m), weight 72.6 kg (160 lb 0.9 oz), SpO2 94 %.  Intake/Output Summary (Last 24 hours) at 12/22/14 1423 Last data filed at 12/21/14 1820  Gross per 24 hour  Intake      0 ml  Output      0 ml  Net      0 ml    Exam General: NAD HEENT: Anicteic Sclera Neck: Supple, no JVD, no masses  Cardiovascular: RRR, S1 S2 auscultated, no rubs, murmurs or gallops; pedal pulses 3+ b/l. Respiratory: Clear to auscultation bilaterally with equal chest rise  Abdomen: Soft, nontender, nondistended, + bowel sounds  Neuro: left sided weakness Skin: no rashes Psych: Normal affect and demeanor with intact judgement and insight   Data Review   Cultures - Results for orders placed or performed during the hospital encounter of 12/14/14  Blood Culture (routine x 2)     Status: None   Collection Time: 12/14/14  5:35 PM  Result Value Ref Range Status   Specimen Description BLOOD LEFT ARM  Final   Special Requests BOTTLES DRAWN AEROBIC AND ANAEROBIC  5CC  Final   Culture   Final    STAPHYLOCOCCUS SPECIES (COAGULASE NEGATIVE) Note: THE SIGNIFICANCE OF ISOLATING THIS ORGANISM FROM A SINGLE SET OF BLOOD CULTURES WHEN MULTIPLE SETS ARE DRAWN IS UNCERTAIN. PLEASE NOTIFY THE MICROBIOLOGY DEPARTMENT WITHIN ONE WEEK IF SPECIATION AND SENSITIVITIES ARE REQUIRED. Note: Gram Stain Report Called to,Read Back By and Verified With: MONICA KARLEN ON 1.13.2016 AT 11:16P BY WILEJ Performed at [facility]    Report Status 12/17/2014 FINAL  Final  Blood Culture (routine x 2)     Status: None   Collection Time: 12/14/14  5:50 PM  Result Value Ref Range Status   Specimen Description BLOOD RIGHT ARM  Final   Special Requests BOTTLES DRAWN AEROBIC AND ANAEROBIC 5CC  Final   Culture   Final    NO GROWTH 5 DAYS Performed at [facility]    Report Status 12/21/2014 FINAL  Final  Urine culture     Status: None   Collection Time: 12/14/14  6:24 PM  Result Value Ref Range Status   Specimen Description URINE, CATHETERIZED  Final   Special Requests NONE  Final   Colony Count NO GROWTH Performed at [facility]   Final   Culture NO GROWTH Performed at [facility]   Final   Report Status 12/15/2014 FINAL  Final  Culture, blood (routine x 2)     Status: None (Preliminary result)   Collection Time: 12/17/14 10:32 AM  Result Value Ref Range Status   Specimen Description BLOOD RIGHT HAND  Final   Special Requests BOTTLES DRAWN AEROBIC ONLY 3CC  Final   Culture   Final           BLOOD CULTURE RECEIVED NO GROWTH TO DATE CULTURE WILL BE HELD FOR 5 DAYS BEFORE ISSUING A FINAL NEGATIVE REPORT Performed at [facility]    Report Status PENDING  Incomplete  Culture, blood (routine x 2)     Status: None (Preliminary result)   Collection Time: 12/17/14 10:41 AM  Result Value Ref Range Status   Specimen Description BLOOD LEFT HAND  Final   Special Requests BOTTLES DRAWN AEROBIC ONLY 5CC  Final   Culture   Final            BLOOD CULTURE RECEIVED NO GROWTH TO DATE CULTURE WILL BE HELD FOR 5 DAYS BEFORE ISSUING A FINAL NEGATIVE REPORT Performed at [facility]    Report Status PENDING  Incomplete     CBC w Diff: Lab Results  Component Value Date   WBC 7.1 12/22/2014   WBC 8.3 01/17/2014   HGB 12.1 12/22/2014   HGB 10.8* 01/17/2014   HCT 36.8 12/22/2014  HCT 35.2* 01/17/2014   PLT 228 12/22/2014   LYMPHOPCT 22 12/14/2014   MONOPCT 11 12/14/2014   EOSPCT 1 12/14/2014   BASOPCT 0 12/14/2014   CMP: Lab Results  Component Value Date   NA 142 12/22/2014   K 3.5 12/22/2014   CL 108 12/22/2014   CO2 24 12/22/2014   BUN 7 12/22/2014   CREATININE 0.73 12/22/2014   CREATININE 1.14* 09/28/2013   PROT 6.9 12/17/2014   ALBUMIN 3.4* 12/17/2014   BILITOT 1.5* 12/17/2014   ALKPHOS 63 12/17/2014   AST 26 12/17/2014   ALT 18 12/17/2014  .  Micro Results Recent Results (from the past 240 hour(s))  Blood Culture (routine x 2)     Status: None   Collection Time: 12/14/14  5:35 PM  Result Value Ref Range Status   Specimen Description BLOOD LEFT ARM  Final   Special Requests BOTTLES DRAWN AEROBIC AND ANAEROBIC 5CC  Final   Culture   Final    STAPHYLOCOCCUS SPECIES (COAGULASE NEGATIVE) Note: THE SIGNIFICANCE OF ISOLATING THIS ORGANISM FROM A SINGLE SET OF BLOOD CULTURES WHEN MULTIPLE SETS ARE DRAWN IS UNCERTAIN. PLEASE NOTIFY THE MICROBIOLOGY DEPARTMENT WITHIN ONE WEEK IF SPECIATION AND SENSITIVITIES ARE REQUIRED. Note: Gram Stain Report Called to,Read Back By and Verified With: MONICA KARLEN ON 1.13.2016 AT 11:16P BY WILEJ Performed at [facility]    Report Status 12/17/2014 FINAL  Final  Blood Culture (routine x 2)     Status: None   Collection Time: 12/14/14  5:50 PM  Result Value Ref Range Status   Specimen Description BLOOD RIGHT ARM  Final   Special Requests BOTTLES DRAWN AEROBIC AND ANAEROBIC 5CC  Final   Culture   Final    NO GROWTH 5 DAYS Performed at [facility]     Report Status 12/21/2014 FINAL  Final  Urine culture     Status: None   Collection Time: 12/14/14  6:24 PM  Result Value Ref Range Status   Specimen Description URINE, CATHETERIZED  Final   Special Requests NONE  Final   Colony Count NO GROWTH Performed at [facility]   Final   Culture NO GROWTH Performed at [facility]   Final   Report Status 12/15/2014 FINAL  Final  Culture, blood (routine x 2)     Status: None (Preliminary result)   Collection Time: 12/17/14 10:32 AM  Result Value Ref Range Status   Specimen Description BLOOD RIGHT HAND  Final   Special Requests BOTTLES DRAWN AEROBIC ONLY 3CC  Final   Culture   Final           BLOOD CULTURE RECEIVED NO GROWTH TO DATE CULTURE WILL BE HELD FOR 5 DAYS BEFORE ISSUING A FINAL NEGATIVE REPORT Performed at [facility]    Report Status PENDING  Incomplete  Culture, blood (routine x 2)     Status: None (Preliminary result)   Collection Time: 12/17/14 10:41 AM  Result Value Ref Range Status   Specimen Description BLOOD LEFT HAND  Final   Special Requests BOTTLES DRAWN AEROBIC ONLY 5CC  Final   Culture   Final           BLOOD CULTURE RECEIVED NO GROWTH TO DATE CULTURE WILL BE HELD FOR 5 DAYS BEFORE ISSUING A FINAL NEGATIVE REPORT Performed at [facility]    Report Status PENDING  Incomplete     Discharge Instructions     Please monitor closely for urinary retention symptoms  Follow-up Information    Follow up with Georgann Housekeeper, MD. Schedule an appointment as soon as possible for a visit in 1 week.   Specialty:  Internal Medicine   Contact information:   [address], [address] [place] [address] [phone]       Discharge Medications     Medication List    STOP taking these medications        LORazepam 2 MG/ML concentrated solution  Commonly known as:  ATIVAN     nitrofurantoin (macrocrystal-monohydrate) 100 MG capsule  Commonly known as:  MACROBID      TAKE  these medications        acetaminophen 500 MG tablet  Commonly known as:  TYLENOL  Take 1,000 mg by mouth every 6 (six) hours as needed for mild pain or moderate pain.     ALPRAZolam 0.25 MG tablet  Commonly known as:  XANAX  Take 1 tablet (0.25 mg total) by mouth 2 (two) times daily as needed for anxiety.     aspirin 81 MG chewable tablet  Chew 4 tablets (324 mg total) by mouth daily.     atorvastatin 80 MG tablet  Commonly known as:  LIPITOR  Take 1 tablet (80 mg total) by mouth daily at 6 PM.     bisacodyl 5 MG EC tablet  Commonly known as:  DULCOLAX  Take 5 mg by mouth as needed for severe constipation.     cholecalciferol 1000 UNITS tablet  Commonly known as:  VITAMIN D  Take 1,000 Units by mouth daily.     clopidogrel 75 MG tablet  Commonly known as:  PLAVIX  Take 1 tablet (75 mg total) by mouth daily.     diclofenac sodium 1 % Gel  Commonly known as:  VOLTAREN  Apply 4 g topically 3 (three) times daily.     doxazosin 8 MG tablet  Commonly known as:  CARDURA  Take 8 mg by mouth at bedtime.     ENSURE  Take 237 mLs by mouth 3 (three) times daily between meals.     ferrous sulfate 325 (65 FE) MG tablet  Take 325 mg by mouth daily with breakfast.     folic acid 1 MG tablet  Commonly known as:  FOLVITE  Take 1 mg by mouth daily.     hydrALAZINE 50 MG tablet  Commonly known as:  APRESOLINE  Take 75 mg by mouth 3 (three) times daily. Take every 8 hours for hypertension - hold for SBP <110     NON FORMULARY  Inhale into the lungs at bedtime. Oxygen---Nightly     polyethylene glycol packet  Commonly known as:  MIRALAX / GLYCOLAX  Take 17 g by mouth at bedtime.     potassium chloride SA 20 MEQ tablet  Commonly known as:  K-DUR,KLOR-CON  Take 20 mEq by mouth once.     predniSONE 5 MG tablet  Commonly known as:  DELTASONE  Take 5 mg by mouth daily with breakfast.     REFRESH 1 % ophthalmic solution  Generic drug:  carboxymethylcellulose  Apply 1 drop to  eye at bedtime.     SYSTANE 0.4-0.3 % Soln  Generic drug:  Polyethyl Glycol-Propyl Glycol  Apply 1 drop to eye 3 (three) times daily.     traMADol 50 MG tablet  Commonly known as:  ULTRAM  Take 1 tablet (50 mg total) by mouth every 6 (six) hours as needed for moderate pain.     verapamil  120 MG CR tablet  Commonly known as:  CALAN-SR  Take 1 tablet (120 mg total) by mouth daily.         Total Time in preparing paper work, data evaluation and todays exam - 35 minutes  Mithcell Schumpert M.D on 12/22/2014 at 2:23 PM  [facility] Office  [phone]

## 2014-12-22 NOTE — Progress Notes (Signed)
Discharge orders received.  VSS upon discharge.  Pt dressed and IV removed.  Transported via [facility].  Report called to La[facility] at [facility]. Sondra Come, RN

## 2014-12-23 LAB — CULTURE, BLOOD (ROUTINE X 2)
Culture: NO GROWTH
Culture: NO GROWTH

## 2014-12-28 ENCOUNTER — Encounter (HOSPITAL_COMMUNITY): Payer: Self-pay | Admitting: Emergency Medicine

## 2014-12-28 ENCOUNTER — Emergency Department (HOSPITAL_COMMUNITY): Payer: Medicare PPO

## 2014-12-28 ENCOUNTER — Inpatient Hospital Stay (HOSPITAL_COMMUNITY)
Admission: EM | Admit: 2014-12-28 | Discharge: 2014-12-29 | DRG: 641 | Disposition: A | Discharge status: Skilled Nursing Facility | Payer: Medicare PPO | Attending: Internal Medicine | Admitting: Internal Medicine

## 2014-12-28 DIAGNOSIS — I5032 Chronic diastolic (congestive) heart failure: Secondary | ICD-10-CM | POA: Diagnosis present

## 2014-12-28 DIAGNOSIS — R339 Retention of urine, unspecified: Secondary | ICD-10-CM | POA: Diagnosis present

## 2014-12-28 DIAGNOSIS — Z7982 Long term (current) use of aspirin: Secondary | ICD-10-CM

## 2014-12-28 DIAGNOSIS — R410 Disorientation, unspecified: Secondary | ICD-10-CM

## 2014-12-28 DIAGNOSIS — E119 Type 2 diabetes mellitus without complications: Secondary | ICD-10-CM | POA: Diagnosis present

## 2014-12-28 DIAGNOSIS — I69354 Hemiplegia and hemiparesis following cerebral infarction affecting left non-dominant side: Secondary | ICD-10-CM

## 2014-12-28 DIAGNOSIS — I129 Hypertensive chronic kidney disease with stage 1 through stage 4 chronic kidney disease, or unspecified chronic kidney disease: Secondary | ICD-10-CM | POA: Diagnosis present

## 2014-12-28 DIAGNOSIS — Z79899 Other long term (current) drug therapy: Secondary | ICD-10-CM

## 2014-12-28 DIAGNOSIS — N189 Chronic kidney disease, unspecified: Secondary | ICD-10-CM | POA: Diagnosis present

## 2014-12-28 DIAGNOSIS — M069 Rheumatoid arthritis, unspecified: Secondary | ICD-10-CM | POA: Diagnosis present

## 2014-12-28 DIAGNOSIS — R55 Syncope and collapse: Secondary | ICD-10-CM

## 2014-12-28 DIAGNOSIS — Z823 Family history of stroke: Secondary | ICD-10-CM | POA: Diagnosis not present

## 2014-12-28 DIAGNOSIS — Z8673 Personal history of transient ischemic attack (TIA), and cerebral infarction without residual deficits: Secondary | ICD-10-CM | POA: Diagnosis not present

## 2014-12-28 DIAGNOSIS — E785 Hyperlipidemia, unspecified: Secondary | ICD-10-CM | POA: Diagnosis present

## 2014-12-28 DIAGNOSIS — E669 Obesity, unspecified: Secondary | ICD-10-CM | POA: Diagnosis present

## 2014-12-28 DIAGNOSIS — N390 Urinary tract infection, site not specified: Secondary | ICD-10-CM | POA: Diagnosis present

## 2014-12-28 DIAGNOSIS — Z6831 Body mass index (BMI) 31.0-31.9, adult: Secondary | ICD-10-CM

## 2014-12-28 DIAGNOSIS — N179 Acute kidney failure, unspecified: Secondary | ICD-10-CM

## 2014-12-28 DIAGNOSIS — R4182 Altered mental status, unspecified: Secondary | ICD-10-CM | POA: Diagnosis present

## 2014-12-28 DIAGNOSIS — E860 Dehydration: Secondary | ICD-10-CM | POA: Diagnosis not present

## 2014-12-28 DIAGNOSIS — Z87891 Personal history of nicotine dependence: Secondary | ICD-10-CM | POA: Diagnosis not present

## 2014-12-28 DIAGNOSIS — I639 Cerebral infarction, unspecified: Secondary | ICD-10-CM | POA: Diagnosis present

## 2014-12-28 DIAGNOSIS — Z7902 Long term (current) use of antithrombotics/antiplatelets: Secondary | ICD-10-CM | POA: Diagnosis not present

## 2014-12-28 DIAGNOSIS — Z7952 Long term (current) use of systemic steroids: Secondary | ICD-10-CM | POA: Diagnosis not present

## 2014-12-28 HISTORY — DX: Cerebral infarction, unspecified: I63.9

## 2014-12-28 LAB — RAPID URINE DRUG SCREEN, HOSP PERFORMED
Amphetamines: NOT DETECTED
BARBITURATES: NOT DETECTED
Benzodiazepines: NOT DETECTED
Cocaine: NOT DETECTED
Opiates: NOT DETECTED
Tetrahydrocannabinol: NOT DETECTED

## 2014-12-28 LAB — CBC WITH DIFFERENTIAL/PLATELET
BASOS ABS: 0 10*3/uL (ref 0.0–0.1)
BASOS PCT: 0 % (ref 0–1)
Eosinophils Absolute: 0 10*3/uL (ref 0.0–0.7)
Eosinophils Relative: 0 % (ref 0–5)
HCT: 42.8 % (ref 36.0–46.0)
HEMOGLOBIN: 14.4 g/dL (ref 12.0–15.0)
Lymphocytes Relative: 10 % — ABNORMAL LOW (ref 12–46)
Lymphs Abs: 1.2 10*3/uL (ref 0.7–4.0)
MCH: 32.7 pg (ref 26.0–34.0)
MCHC: 33.6 g/dL (ref 30.0–36.0)
MCV: 97.1 fL (ref 78.0–100.0)
MONOS PCT: 5 % (ref 3–12)
Monocytes Absolute: 0.6 10*3/uL (ref 0.1–1.0)
NEUTROS PCT: 85 % — AB (ref 43–77)
Neutro Abs: 10.2 10*3/uL — ABNORMAL HIGH (ref 1.7–7.7)
PLATELETS: 217 10*3/uL (ref 150–400)
RBC: 4.41 MIL/uL (ref 3.87–5.11)
RDW: 15.9 % — ABNORMAL HIGH (ref 11.5–15.5)
WBC: 12 10*3/uL — ABNORMAL HIGH (ref 4.0–10.5)

## 2014-12-28 LAB — ETHANOL: Alcohol, Ethyl (B): <5 mg/dL (ref 0–9)

## 2014-12-28 LAB — CREATININE, SERUM
Creatinine, Ser: 0.95 mg/dL (ref 0.50–1.10)
GFR calc Af Amer: 62 mL/min — ABNORMAL LOW (ref >90)
GFR calc non Af Amer: 54 mL/min — ABNORMAL LOW (ref >90)

## 2014-12-28 LAB — TROPONIN I: TROPONIN I: 0.03 ng/mL (ref <0.031)

## 2014-12-28 LAB — PROTIME-INR
INR: 1.05 (ref 0.00–1.49)
Prothrombin Time: 13.8 seconds (ref 11.6–15.2)

## 2014-12-28 LAB — CBC
HCT: 40.3 % (ref 36.0–46.0)
Hemoglobin: 13.6 g/dL (ref 12.0–15.0)
MCH: 32.7 pg (ref 26.0–34.0)
MCHC: 33.7 g/dL (ref 30.0–36.0)
MCV: 96.9 fL (ref 78.0–100.0)
PLATELETS: 238 10*3/uL (ref 150–400)
RBC: 4.16 MIL/uL (ref 3.87–5.11)
RDW: 16 % — ABNORMAL HIGH (ref 11.5–15.5)
WBC: 10.1 10*3/uL (ref 4.0–10.5)

## 2014-12-28 LAB — LACTIC ACID, PLASMA: Lactic Acid, Venous: 3 mmol/L (ref 0.5–2.0)

## 2014-12-28 LAB — URINE MICROSCOPIC-ADD ON

## 2014-12-28 LAB — COMPREHENSIVE METABOLIC PANEL
ALT: 29 U/L (ref 0–35)
AST: 40 U/L — ABNORMAL HIGH (ref 0–37)
Albumin: 4 g/dL (ref 3.5–5.2)
Alkaline Phosphatase: 77 U/L (ref 39–117)
Anion gap: 14 (ref 5–15)
BUN: 14 mg/dL (ref 6–23)
CO2: 22 mmol/L (ref 19–32)
CREATININE: 1.26 mg/dL — AB (ref 0.50–1.10)
Calcium: 10.4 mg/dL (ref 8.4–10.5)
Chloride: 105 mmol/L (ref 96–112)
GFR calc Af Amer: 44 mL/min — ABNORMAL LOW (ref >90)
GFR calc non Af Amer: 38 mL/min — ABNORMAL LOW (ref >90)
Glucose, Bld: 154 mg/dL — ABNORMAL HIGH (ref 70–99)
POTASSIUM: 4 mmol/L (ref 3.5–5.1)
SODIUM: 141 mmol/L (ref 135–145)
Total Bilirubin: 1.2 mg/dL (ref 0.3–1.2)
Total Protein: 8.2 g/dL (ref 6.0–8.3)

## 2014-12-28 LAB — I-STAT CHEM 8, ED
BUN: 16 mg/dL (ref 6–23)
Calcium, Ion: 1.23 mmol/L (ref 1.13–1.30)
Chloride: 104 mmol/L (ref 96–112)
Creatinine, Ser: 1.1 mg/dL (ref 0.50–1.10)
GLUCOSE: 156 mg/dL — AB (ref 70–99)
HCT: 48 % — ABNORMAL HIGH (ref 36.0–46.0)
Hemoglobin: 16.3 g/dL — ABNORMAL HIGH (ref 12.0–15.0)
Potassium: 3.9 mmol/L (ref 3.5–5.1)
Sodium: 143 mmol/L (ref 135–145)
TCO2: 20 mmol/L (ref 0–100)

## 2014-12-28 LAB — I-STAT TROPONIN, ED: TROPONIN I, POC: 0.01 ng/mL (ref 0.00–0.08)

## 2014-12-28 LAB — URINALYSIS, ROUTINE W REFLEX MICROSCOPIC
Bilirubin Urine: NEGATIVE
GLUCOSE, UA: NEGATIVE mg/dL
Hgb urine dipstick: NEGATIVE
KETONES UR: NEGATIVE mg/dL
NITRITE: NEGATIVE
PH: 6 (ref 5.0–8.0)
Protein, ur: NEGATIVE mg/dL
SPECIFIC GRAVITY, URINE: 1.015 (ref 1.005–1.030)
UROBILINOGEN UA: 0.2 mg/dL (ref 0.0–1.0)

## 2014-12-28 LAB — CBG MONITORING, ED: GLUCOSE-CAPILLARY: 145 mg/dL — AB (ref 70–99)

## 2014-12-28 LAB — MRSA PCR SCREENING: MRSA by PCR: NEGATIVE

## 2014-12-28 MED ORDER — POLYVINYL ALCOHOL 1.4 % OP SOLN
1.0000 [drp] | Freq: Every day | OPHTHALMIC | Status: DC
  Administered 2014-12-28: 1 [drp] via OPHTHALMIC
  Filled 2014-12-28: qty 15

## 2014-12-28 MED ORDER — CLOPIDOGREL BISULFATE 75 MG PO TABS
75.0000 mg | ORAL_TABLET | Freq: Every day | ORAL | Status: DC
  Administered 2014-12-28: 75 mg via ORAL
  Filled 2014-12-28 (×2): qty 1

## 2014-12-28 MED ORDER — FERROUS SULFATE 325 (65 FE) MG PO TABS
325.0000 mg | ORAL_TABLET | Freq: Every day | ORAL | Status: DC
  Administered 2014-12-29: 325 mg via ORAL
  Filled 2014-12-28 (×2): qty 1

## 2014-12-28 MED ORDER — ASPIRIN 81 MG PO CHEW: 324.0000 mg | CHEWABLE_TABLET | Freq: Every day | ORAL | Status: DC

## 2014-12-28 MED ORDER — SODIUM CHLORIDE 0.9 % IV SOLN: INTRAVENOUS | Status: DC

## 2014-12-28 MED ORDER — ATORVASTATIN CALCIUM 80 MG PO TABS
80.0000 mg | ORAL_TABLET | Freq: Every day | ORAL | Status: DC
  Administered 2014-12-28 – 2014-12-29 (×2): 80 mg via ORAL
  Filled 2014-12-28 (×2): qty 1

## 2014-12-28 MED ORDER — BISACODYL 5 MG PO TBEC
5.0000 mg | DELAYED_RELEASE_TABLET | ORAL | Status: DC | PRN
Start: 2014-12-28 — End: 2014-12-29

## 2014-12-28 MED ORDER — VITAMIN D3 25 MCG (1000 UNIT) PO TABS
1000.0000 [IU] | ORAL_TABLET | Freq: Every day | ORAL | Status: DC
  Administered 2014-12-29: 1000 [IU] via ORAL
  Filled 2014-12-28 (×2): qty 1

## 2014-12-28 MED ORDER — FOLIC ACID 1 MG PO TABS
1.0000 mg | ORAL_TABLET | Freq: Every day | ORAL | Status: DC
  Administered 2014-12-29: 1 mg via ORAL
  Filled 2014-12-28 (×2): qty 1

## 2014-12-28 MED ORDER — CARBOXYMETHYLCELLULOSE SODIUM 1 % OP SOLN: 1.0000 [drp] | Freq: Every day | OPHTHALMIC | Status: DC

## 2014-12-28 MED ORDER — SODIUM CHLORIDE 0.9 % IV SOLN: 1000.0000 mL | INTRAVENOUS | Status: DC

## 2014-12-28 MED ORDER — POLYETHYLENE GLYCOL 3350 17 G PO PACK
17.0000 g | PACK | Freq: Every day | ORAL | Status: DC
  Filled 2014-12-28 (×2): qty 1

## 2014-12-28 MED ORDER — DEXTROSE 5 % IV SOLN
1.0000 g | INTRAVENOUS | Status: DC
  Filled 2014-12-28 (×2): qty 10

## 2014-12-28 MED ORDER — SODIUM CHLORIDE 0.9 % IV SOLN
1000.0000 mL | Freq: Once | INTRAVENOUS | Status: AC
  Administered 2014-12-28: 1000 mL via INTRAVENOUS

## 2014-12-28 MED ORDER — PREDNISONE 5 MG PO TABS
5.0000 mg | ORAL_TABLET | Freq: Every day | ORAL | Status: DC
  Administered 2014-12-29: 5 mg via ORAL
  Filled 2014-12-28 (×2): qty 1

## 2014-12-28 MED ORDER — SODIUM CHLORIDE 0.9 % IV BOLUS (SEPSIS): 500.0000 mL | Freq: Once | INTRAVENOUS | Status: DC

## 2014-12-28 MED ORDER — POTASSIUM CHLORIDE CRYS ER 20 MEQ PO TBCR: 20.0000 meq | EXTENDED_RELEASE_TABLET | Freq: Once | ORAL | Status: DC

## 2014-12-28 MED ORDER — ONDANSETRON HCL 4 MG/2ML IJ SOLN
4.0000 mg | Freq: Four times a day (QID) | INTRAMUSCULAR | Status: DC | PRN
Start: 2014-12-28 — End: 2014-12-29

## 2014-12-28 MED ORDER — ONDANSETRON HCL 4 MG PO TABS: 4.0000 mg | ORAL_TABLET | Freq: Four times a day (QID) | ORAL | Status: DC | PRN

## 2014-12-28 MED ORDER — HEPARIN SODIUM (PORCINE) 5000 UNIT/ML IJ SOLN
5000.0000 [IU] | Freq: Three times a day (TID) | INTRAMUSCULAR | Status: DC
  Administered 2014-12-28 – 2014-12-29 (×3): 5000 [IU] via SUBCUTANEOUS
  Filled 2014-12-28 (×5): qty 1

## 2014-12-28 MED ORDER — POLYETHYL GLYCOL-PROPYL GLYCOL 0.4-0.3 % OP SOLN: 1.0000 [drp] | Freq: Three times a day (TID) | OPHTHALMIC | Status: DC

## 2014-12-28 MED ORDER — MUSCLE RUB 10-15 % EX CREA
TOPICAL_CREAM | CUTANEOUS | Status: DC | PRN
  Administered 2014-12-28: 23:00:00 via TOPICAL
  Filled 2014-12-28: qty 85

## 2014-12-28 MED ORDER — HYDROCORTISONE NA SUCCINATE PF 100 MG IJ SOLR: 50.0000 mg | Freq: Once | INTRAMUSCULAR | Status: DC

## 2014-12-28 MED ORDER — ACETAMINOPHEN 500 MG PO TABS: 1000.0000 mg | ORAL_TABLET | Freq: Four times a day (QID) | ORAL | Status: DC | PRN

## 2014-12-28 MED ORDER — POLYVINYL ALCOHOL 1.4 % OP SOLN
1.0000 [drp] | Freq: Three times a day (TID) | OPHTHALMIC | Status: DC
  Administered 2014-12-29 (×3): 1 [drp] via OPHTHALMIC
  Filled 2014-12-28 (×4): qty 0.1

## 2014-12-28 MED ORDER — SODIUM CHLORIDE 0.9 % IV SOLN: 100.0000 mL/h | INTRAVENOUS | Status: DC

## 2014-12-28 MED ORDER — SODIUM CHLORIDE 0.9 % IV SOLN
INTRAVENOUS | Status: DC
  Administered 2014-12-28 – 2014-12-29 (×2): via INTRAVENOUS

## 2014-12-28 NOTE — ED Notes (Addendum)
Admitting MD at bedside to call daughters.

## 2014-12-28 NOTE — ED Notes (Addendum)
MD, neurologist, at bedside talking with family.

## 2014-12-28 NOTE — H&P (Signed)
Patient Demographics  Monica Blankenship, is a 81 y.o. female  MRN: [id number]   DOB - July 25, 1929  Admit Date - 12/28/2014  Outpatient Primary MD for the patient is Georgann Housekeeper, MD   With History of -  Past Medical History  Diagnosis Date  . Allergy   . Anxiety   . Insomnia   . Osteoarthritis   . Anemia   . Obesity   . Diastolic dysfunction   . CKD (chronic kidney disease)   . Mitral stenosis     with moderate MR on echo 4/14  . Aortic stenosis     mild by echo 4/14  . Aortic regurgitation     mild to moderate by echo 4/14  . HTN (hypertension)   . Stroke       Past Surgical History  Procedure Laterality Date  . Eye surgery    . Abdominal hysterectomy    . Appendectomy    . Cataract extraction, bilateral    . Tee without cardioversion N/A 04/13/2014    Procedure: TRANSESOPHAGEAL ECHOCARDIOGRAM (TEE);  Surgeon: Vesta Mixer, MD;  Location: [facility] ENDOSCOPY;  Service: Cardiovascular;  Laterality: N/A;    in for   Chief Complaint  Patient presents with  . Altered Mental Status     HPI  Monica Blankenship  is a 81 y.o. 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 Patient was brought by EMS secondary to syncope, patient lives in a skilled nursing facility, was noticed to be diaphoretic, and had episode of syncope, then she was confused after that, systolic blood pressure was in the 70s, EMS was called when the daughter arrived and noticed her baseline weakness appears to be worse, code stroke was called on arrival, CT head was done which did not show any evidence of acute stroke, patient was seen by neurology in ED, who did not think her symptoms are related to stroke, and it was most likely due to syncope, with recommendation for  further workup of her syncope, patient was noticed to have urinary retention in ED of 700 mL, urinalysis was positive, as it appears to be clinically dehydrated, as well she had elevation of her creatinine from 0.73 to 1.26, chest x-ray did not show any acute findings.    Review of Systems   Could not obtain reliable review of systems from the patient, as she appears to be mildly confused, does not remember the events exactly.  Social History History  Substance Use Topics  . Smoking status: Former Smoker -- 0.25 packs/day for 1 years  . Smokeless tobacco: Never Used     Comment: smoked 1-3 cigs/day in school for about a year some days only  . Alcohol Use: No     Family History Family History  Problem Relation Age of Onset  . Hypertension Mother   . CVA Mother     Prior to Admission medications   Medication Sig Start Date End Date Taking? Authorizing Provider  acetaminophen (TYLENOL) 500 MG tablet Take 1,000 mg by mouth every 6 (six) hours as needed for mild pain or moderate pain.    Historical Provider, MD  ALPRAZolam Prudy Feeler) 0.25 MG tablet Take 1 tablet (0.25 mg total) by mouth 2 (two) times daily as needed for anxiety. 12/22/14   Huey Bienenstock, MD  aspirin 81  MG chewable tablet Chew 4 tablets (324 mg total) by mouth daily. 11/17/14   Rhetta Mura, MD  atorvastatin (LIPITOR) 80 MG tablet Take 1 tablet (80 mg total) by mouth daily at 6 PM. 11/17/14   Rhetta Mura, MD  bisacodyl (DULCOLAX) 5 MG EC tablet Take 5 mg by mouth as needed for severe constipation.    Historical Provider, MD  carboxymethylcellulose (REFRESH) 1 % ophthalmic solution Apply 1 drop to eye at bedtime.    Historical Provider, MD  cholecalciferol (VITAMIN D) 1000 UNITS tablet Take 1,000 Units by mouth daily.    Historical Provider, MD  clopidogrel (PLAVIX) 75 MG tablet Take 1 tablet (75 mg total) by mouth daily. 11/17/14   Rhetta Mura, MD  diclofenac sodium (VOLTAREN) 1 % GEL Apply 4 g  topically 3 (three) times daily.    Historical Provider, MD  doxazosin (CARDURA) 8 MG tablet Take 8 mg by mouth at bedtime.    Historical Provider, MD  ENSURE (ENSURE) Take 237 mLs by mouth 3 (three) times daily between meals.    Historical Provider, MD  ferrous sulfate 325 (65 FE) MG tablet Take 325 mg by mouth daily with breakfast.    Historical Provider, MD  folic acid (FOLVITE) 1 MG tablet Take 1 mg by mouth daily.    Historical Provider, MD  hydrALAZINE (APRESOLINE) 50 MG tablet Take 75 mg by mouth 3 (three) times daily. Take every 8 hours for hypertension - hold for SBP <110    Historical Provider, MD  NON FORMULARY Inhale into the lungs at bedtime. Oxygen---Nightly    Historical Provider, MD  Polyethyl Glycol-Propyl Glycol (SYSTANE) 0.4-0.3 % SOLN Apply 1 drop to eye 3 (three) times daily.    Historical Provider, MD  polyethylene glycol (MIRALAX / GLYCOLAX) packet Take 17 g by mouth at bedtime.    Historical Provider, MD  potassium chloride SA (K-DUR,KLOR-CON) 20 MEQ tablet Take 20 mEq by mouth once.    Historical Provider, MD  predniSONE (DELTASONE) 5 MG tablet Take 5 mg by mouth daily with breakfast.    Historical Provider, MD  traMADol (ULTRAM) 50 MG tablet Take 1 tablet (50 mg total) by mouth every 6 (six) hours as needed for moderate pain. 12/22/14   Huey Bienenstock, MD  verapamil (CALAN-SR) 120 MG CR tablet Take 1 tablet (120 mg total) by mouth daily. 05/24/14   Georgann Housekeeper, MD    Allergies  Allergen Reactions  . Fentanyl Other (See Comments)    confusion    Physical Exam  Vitals  Blood pressure 110/44, pulse 61, temperature 97.9 F (36.6 C), temperature source Oral, resp. rate 25, SpO2 96 %.   1. General frail elderly female lying in bed in NAD,    2. Normal affect and insight, Not Suicidal or Homicidal, Awake Alert, Oriented .  3. Patient has significant left-sided weakness.  4. Ears and Eyes appear Normal, Conjunctivae clear, PERRLA. Dry Oral Mucosa.  5. Supple  Neck, No JVD, No cervical lymphadenopathy appriciated, No Carotid Bruits.  6. Symmetrical Chest wall movement, Good air movement bilaterally, CTAB.  7. RRR, No Gallops, Rubs or Murmurs, No Parasternal Heave.  8. Positive Bowel Sounds, Abdomen Soft, No tenderness, No organomegaly appriciated,No rebound -guarding or rigidity.  9.  No Cyanosis, Normal Skin Turgor, No Skin Rash or Bruise.  10. Good muscle tone,  joints appear normal , no effusions, Normal ROM.  11. No Palpable Lymph Nodes in Neck or Axillae    Data Review  CBC  Recent Labs  Lab 12/22/14 0630 12/28/14 1253 12/28/14 1307  WBC 7.1 12.0*  --   HGB 12.1 14.4 16.3*  HCT 36.8 42.8 48.0*  PLT 228 217  --   MCV 96.8 97.1  --   MCH 31.8 32.7  --   MCHC 32.9 33.6  --   RDW 15.6* 15.9*  --   LYMPHSABS  --  1.2  --   MONOABS  --  0.6  --   EOSABS  --  0.0  --   BASOSABS  --  0.0  --    ------------------------------------------------------------------------------------------------------------------  Chemistries   Recent Labs Lab 12/22/14 0630 12/28/14 1253 12/28/14 1307  NA 142 141 143  K 3.5 4.0 3.9  CL 108 105 104  CO2 24 22  --   GLUCOSE 97 154* 156*  BUN 7 14 16   CREATININE 0.73 1.26* 1.10  CALCIUM 9.4 10.4  --   AST  --  40*  --   ALT  --  29  --   ALKPHOS  --  77  --   BILITOT  --  1.2  --    ------------------------------------------------------------------------------------------------------------------ estimated creatinine clearance is 36.2 mL/min (by C-G formula based on Cr of 1.1). ------------------------------------------------------------------------------------------------------------------ No results for input(s): TSH, T4TOTAL, T3FREE, THYROIDAB in the last 72 hours.  Invalid input(s): FREET3   Coagulation profile  Recent Labs Lab 12/28/14 1253  INR 1.05   ------------------------------------------------------------------------------------------------------------------- No  results for input(s): DDIMER in the last 72 hours. -------------------------------------------------------------------------------------------------------------------  Cardiac Enzymes  Recent Labs Lab 12/28/14 1253  TROPONINI 0.03   ------------------------------------------------------------------------------------------------------------------ Invalid input(s): POCBNP   ---------------------------------------------------------------------------------------------------------------  Urinalysis    Component Value Date/Time   COLORURINE YELLOW 12/28/2014 1433   APPEARANCEUR CLEAR 12/28/2014 1433   LABSPEC 1.015 12/28/2014 1433   PHURINE 6.0 12/28/2014 1433   GLUCOSEU NEGATIVE 12/28/2014 1433   HGBUR NEGATIVE 12/28/2014 1433   BILIRUBINUR NEGATIVE 12/28/2014 1433   KETONESUR NEGATIVE 12/28/2014 1433   PROTEINUR NEGATIVE 12/28/2014 1433   UROBILINOGEN 0.2 12/28/2014 1433   NITRITE NEGATIVE 12/28/2014 1433   LEUKOCYTESUR SMALL* 12/28/2014 1433    ----------------------------------------------------------------------------------------------------------------  Imaging results:   Ct Head Wo Contrast  12/28/2014   CLINICAL DATA:  A 4-year-old female with left-sided facial droop and left-sided weakness. Slurred speech. Possible stroke. History of cerebral vascular accident in December 2015.  EXAM: CT HEAD WITHOUT CONTRAST  TECHNIQUE: Contiguous axial images were obtained from the base of the skull through the vertex without intravenous contrast.  COMPARISON:  Brain MRI 12/15/2014.  Head CT 12/14/2014.  FINDINGS: Patchy and confluent areas of decreased attenuation are noted throughout the deep and periventricular white matter of the cerebral hemispheres bilaterally, compatible with chronic microvascular ischemic disease. More confluent low attenuation throughout the watershed distribution in the right cerebral hemisphere, similar to prior examination from 12/14/2014, compatible with prior  watershed territory infarction. Subtle areas of acute ischemia in this region would be impossible to discern on today's CT examination. No signs of acute intracranial hemorrhage, and no large territory ischemic changes identified on today's examination. Old infarct in the right cerebellar hemisphere is unchanged. No mass, mass effect, hydrocephalus or abnormal intra or extra-axial fluid collections. Visualized paranasal sinuses and mastoids are well pneumatized. No acute displaced skull fractures are identified.  IMPRESSION: 1. Today's head CT is essentially unchanged in appearance compared to the prior study from 12/14/2014 again demonstrating extensive chronic microvascular ischemic changes in the cerebral white matter bilaterally, particularly in the watershed distribution in the right cerebral hemisphere, as above. These  results were called by telephone at the time of interpretation on 12/28/2014 at 1:21 pm to Dr. Amada Jupiter, who verbally acknowledged these results.   Electronically Signed   By: Trudie Reed M.D.   On: 12/28/2014 13:22   Dg Chest Port 1 View  12/28/2014   CLINICAL DATA:  Altered mental status.  EXAM: PORTABLE CHEST - 1 VIEW  COMPARISON:  December 14, 2014.  FINDINGS: Stable cardiomediastinal silhouette. No pneumothorax or pleural effusion is noted. No acute pulmonary disease is noted. Bony thorax is intact.  IMPRESSION: No acute cardiopulmonary abnormality seen.   Electronically Signed   By: Roque Lias M.D.   On: 12/28/2014 13:11    My personal review of EKG: Rhythm NSR, Rate  106 /min, QTc 470 , no Acute ST changes    Assessment & Plan  Active Problems:   CKD (chronic kidney disease)   Chronic diastolic CHF (congestive heart failure)   HTN (hypertension)   Syncope   Stroke   Altered mental status    Altered mental status/syncope - This is most likely related to volume depletion and dehydration due to to infectious process, patient has leukocytosis, tachycardia,  positive urinalysis, she does meet sepsis criteria giving her leukocytosis and tachycardia. - patient has UTI, will start on IV rocephin, will check urine culture. - Patient has history of subacute endocarditis and MSSA bacteremia in the past, will check blood cultures, his recent echo done in 12/15/14 , no indication to repeat unless positive blood cultures. - Patient was hypotensive in facility, on multiple blood pressure medication, which we will hold, appears to be dehydrated, will continue with IV fluids, will hydrate closely given her acute diastolic CHF. - We'll give her one dose of IV hydrocortisone, given she is steroid dependent, on prednisone long-term. - Discussed with and neurology, at this point her symptoms appears to be secondary to syncope, no indication to repeat MRI at this point.  History of CVA - Continue with aspirin, Plavix.  History of diastolic CHF - Monitor closely,  Hypertension - Patient is actually hypotensive, will hold all medication  Urinary retention - Patient had 700 mL of urinary retention, will consider Foley catheter, unstable will change Cardura to Flomax giving her blood pressure.  History of rheumatoid arthritis - Daniel on low dose prednisone 5 mg oral daily, patient was ordered 1 stress dose of steroid in ED 50 mg of hydrocortisone   DVT Prophylaxis Heparin -  AM Labs Ordered, also please review Full Orders  Family Communication: Admission, patients condition and plan of care including tests being ordered have been discussed with the patient and daughter Renae Gloss over the phone who indicate understanding and agree with the plan and Code Status.  Code Status full  Likely DC to  back to SNF when stable  Condition GUARDED    Time spent in minutes : 55 minutes    Caden Fukushima M.D on 12/28/2014 at 3:43 PM  Between 7am to 7pm - Pager - [phone]  After 7pm go to [email or web address] - password [email or web address]  And look for the night coverage person  covering me after hours  [facility] Office  [phone]   **Disclaimer: This note may have been dictated with voice recognition software. Similar sounding words can inadvertently be transcribed and this note may contain transcription errors which may not have been corrected upon publication of note.**

## 2014-12-28 NOTE — Consult Note (Addendum)
Referring Physician: Jeraldine Loots    Chief Complaint: Code stroke  HPI:                                                                                                                                         Monica Blankenship is an 81 y.o. female who has been residing at [facility] since her recent stroke in December. 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 Patient was placed on ASA and Plavix. Per daughter she has been taking all her medications. Today she was noted to suddenly become diaphoretic (+/-) syncope and then have AMS. BP was noted to be systolic int he 70's.  EMS was called and when daughter arrived she noted her previous stroke symptoms appeared worse. Patient was brought to ED as syncope but called as code stroke on arrival. CT head was obtained showing no acute stroke and while in ED her symptoms improved. Currently she is showing no worsening of previous symptoms.   Recent Stroke work up 2-D echocardiogram 12/15/2014 Study Conclusions  Impressions: - Unfortunately, a good TR signal could not be obtained. Therefore, pulmonary hypertension can not be assessed.   Monica Blankenship Contrast 12/15/2014  Worsening/acute extension of watershed infarctions in the deep white matter of the right cerebral hemisphere when compared to the previous study.    MRA - 11/15/2014 -  1. Focal severe high-grade stenosis measuring approximately 4 mm in length within the mid right M1 segment. Flow is attenuated within the distal right MCA artery branches. 2. Additional multi focal atherosclerotic irregularity throughout the intracranial circulation. 3. 4 mm somewhat linear focal outpouching arising from the lateral aspect of the cavernous left ICA. This may reflect artifact versus a small aneurysm. A follow-up study in 3 months to document stability   LDL - 75  HgbA1c - 5.7 in December     Date last known well: Date:  12/28/2014 Time last known well: Time: 08:00 tPA Given: No: recent stroke  Past Medical History  Diagnosis Date  . Allergy   . Anxiety   . Insomnia   . Osteoarthritis   . Anemia   . Obesity   . Diastolic dysfunction   . CKD (chronic kidney disease)   . Mitral stenosis     with moderate Monica on echo 4/14  . Aortic stenosis     mild by echo 4/14  . Aortic regurgitation     mild to moderate by echo 4/14  . HTN (hypertension)   . Stroke     Past Surgical History  Procedure Laterality Date  . Eye surgery    . Abdominal hysterectomy    . Appendectomy    . Cataract extraction, bilateral    . Tee without cardioversion N/A 04/13/2014    Procedure: TRANSESOPHAGEAL ECHOCARDIOGRAM (TEE);  Surgeon: Vesta Mixer, MD;  Location:  [facility];  Service: Cardiovascular;  Laterality: N/A;    Family History  Problem Relation Age of Onset  . Hypertension Mother   . CVA Mother    Social History:  reports that she has quit smoking. She has never used smokeless tobacco. She reports that she does not drink alcohol or use illicit drugs.  Allergies:  Allergies  Allergen Reactions  . Fentanyl Other (See Comments)    confusion    Medications:                                                                                                                           Current Facility-Administered Medications  Medication Dose Route Frequency Provider Last Rate Last Dose  . 0.9 %  sodium chloride infusion  1,000 mL Intravenous Once Gerhard Munch, MD       Followed by  . 0.9 %  sodium chloride infusion  1,000 mL Intravenous Continuous Gerhard Munch, MD      . sodium chloride 0.9 % bolus 500 mL  500 mL Intravenous Once Gerhard Munch, MD       Followed by  . 0.9 %  sodium chloride infusion  100 mL/hr Intravenous Continuous Gerhard Munch, MD       Current Outpatient Prescriptions  Medication Sig Dispense Refill  . acetaminophen (TYLENOL) 500 MG tablet Take 1,000 mg by mouth every 6  (six) hours as needed for mild pain or moderate pain.    Marland Kitchen ALPRAZolam (XANAX) 0.25 MG tablet Take 1 tablet (0.25 mg total) by mouth 2 (two) times daily as needed for anxiety. 30 tablet 0  . aspirin 81 MG chewable tablet Chew 4 tablets (324 mg total) by mouth daily.    Marland Kitchen atorvastatin (LIPITOR) 80 MG tablet Take 1 tablet (80 mg total) by mouth daily at 6 PM.    . bisacodyl (DULCOLAX) 5 MG EC tablet Take 5 mg by mouth as needed for severe constipation.    . carboxymethylcellulose (REFRESH) 1 % ophthalmic solution Apply 1 drop to eye at bedtime.    . cholecalciferol (VITAMIN D) 1000 UNITS tablet Take 1,000 Units by mouth daily.    . clopidogrel (PLAVIX) 75 MG tablet Take 1 tablet (75 mg total) by mouth daily. 30 tablet 0  . diclofenac sodium (VOLTAREN) 1 % GEL Apply 4 g topically 3 (three) times daily.    Marland Kitchen doxazosin (CARDURA) 8 MG tablet Take 8 mg by mouth at bedtime.    . ENSURE (ENSURE) Take 237 mLs by mouth 3 (three) times daily between meals.    . ferrous sulfate 325 (65 FE) MG tablet Take 325 mg by mouth daily with breakfast.    . folic acid (FOLVITE) 1 MG tablet Take 1 mg by mouth daily.    . hydrALAZINE (APRESOLINE) 50 MG tablet Take 75 mg by mouth 3 (three) times daily. Take every 8 hours for hypertension - hold for SBP <110    . NON FORMULARY Inhale into the lungs  at bedtime. Oxygen---Nightly    . Polyethyl Glycol-Propyl Glycol (SYSTANE) 0.4-0.3 % SOLN Apply 1 drop to eye 3 (three) times daily.    . polyethylene glycol (MIRALAX / GLYCOLAX) packet Take 17 g by mouth at bedtime.    . potassium chloride SA (K-DUR,KLOR-CON) 20 MEQ tablet Take 20 mEq by mouth once.    . predniSONE (DELTASONE) 5 MG tablet Take 5 mg by mouth daily with breakfast.    . traMADol (ULTRAM) 50 MG tablet Take 1 tablet (50 mg total) by mouth every 6 (six) hours as needed for moderate pain. 30 tablet 0  . verapamil (CALAN-SR) 120 MG CR tablet Take 1 tablet (120 mg total) by mouth daily. 30 tablet 3     ROS:                                                                                                                                        History obtained from daughter  General ROS: negative for - chills, fatigue, fever, night sweats, weight gain or weight loss Psychological ROS: negative for - behavioral disorder, hallucinations, memory difficulties, mood swings or suicidal ideation Ophthalmic ROS: negative for - blurry vision, double vision, eye pain or loss of vision ENT ROS: negative for - epistaxis, nasal discharge, oral lesions, sore throat, tinnitus or vertigo Allergy and Immunology ROS: negative for - hives or itchy/watery eyes Hematological and Lymphatic ROS: negative for - bleeding problems, bruising or swollen lymph nodes Endocrine ROS: negative for - galactorrhea, hair pattern changes, polydipsia/polyuria or temperature intolerance Respiratory ROS: negative for - cough, hemoptysis, shortness of breath or wheezing Cardiovascular ROS: negative for - chest pain, dyspnea on exertion, edema or irregular heartbeat Gastrointestinal ROS: negative for - abdominal pain, diarrhea, hematemesis, nausea/vomiting or stool incontinence Genito-Urinary ROS: negative for - dysuria, hematuria, incontinence or urinary frequency/urgency Musculoskeletal ROS: negative for - joint swelling or muscular weakness Neurological ROS: as noted in HPI Dermatological ROS: negative for rash and skin lesion changes  Neurologic Examination:                                                                                                      Blood pressure 124/44, pulse 65, temperature 97.7 F (36.5 C), temperature source Oral, resp. rate 18, SpO2 97 %.  HEENT-  Normocephalic, no lesions, without obvious abnormality.  Normal external eye and conjunctiva.  Normal TM's bilaterally.  Normal auditory canals and external ears. Normal external nose, mucus membranes and septum.  Normal pharynx.  Cardiovascular- S1, S2 normal, pulses palpable  throughout   Lungs- chest clear, no wheezing, rales, normal symmetric air entry Abdomen- normal findings: bowel sounds normal Extremities- no joint deformities, effusion, or inflammation Lymph-no adenopathy palpable Musculoskeletal-no joint tenderness, deformity or swelling Skin-warm and dry, no hyperpigmentation, vitiligo, or suspicious lesions  Neurological Examination Mental Status: Alert, oriented, thought content appropriate.  Speech dysarthric without evidence of aphasia.  Able to follow 3 step commands without difficulty. Cranial Nerves: II: Discs flat bilaterally;left hemianopsia, pupils equal, round, reactive to light and accommodation III,IV, VI: ptosis not present--unable to fully close left eye lid, EOM shows inability to look to the left bilaterally V,VII: smile asymmetric on the left, facial light touch sensation decreased on the left VIII: hearing normal bilaterally IX,X: gag reflex present XI: bilateral shoulder shrug XII: midline tongue extension Motor: Right : Upper extremity   5/5    Left:     Upper extremity   2/5  Lower extremity   5/5     Lower extremity   3/5 Tone and bulk:normal tone throughout; no atrophy noted Sensory: Pinprick and light touch decreased on the left arm and leg Deep Tendon Reflexes: 2+ and symmetric throughout UE and KJ no AJ Plantars: Right: downgoing   Left: downgoing Cerebellar: normal finger-to-nose on the right arm and unable to assess on the left.  Gait: unable to assess due to multiple leads.        Lab Results: Basic Metabolic Panel:  Recent Labs Lab 12/22/14 0630 12/28/14 1307  NA 142 143  K 3.5 3.9  CL 108 104  CO2 24  --   GLUCOSE 97 156*  BUN 7 16  CREATININE 0.73 1.10  CALCIUM 9.4  --     Liver Function Tests: No results for input(s): AST, ALT, ALKPHOS, BILITOT, PROT, ALBUMIN in the last 168 hours. No results for input(s): LIPASE, AMYLASE in the last 168 hours. No results for input(s): AMMONIA in the last  168 hours.  CBC:  Recent Labs Lab 12/22/14 0630 12/28/14 1253 12/28/14 1307  WBC 7.1 12.0*  --   NEUTROABS  --  10.2*  --   HGB 12.1 14.4 16.3*  HCT 36.8 42.8 48.0*  MCV 96.8 97.1  --   PLT 228 217  --     Cardiac Enzymes: No results for input(s): CKTOTAL, CKMB, CKMBINDEX, TROPONINI in the last 168 hours.  Lipid Panel: No results for input(s): CHOL, TRIG, HDL, CHOLHDL, VLDL, LDLCALC in the last 168 hours.  CBG:  Recent Labs Lab 12/22/14 0627 12/28/14 1250  GLUCAP 89 145*    Microbiology: Results for orders placed or performed during the hospital encounter of 12/14/14  Blood Culture (routine x 2)     Status: None   Collection Time: 12/14/14  5:35 PM  Result Value Ref Range Status   Specimen Description BLOOD LEFT ARM  Final   Special Requests BOTTLES DRAWN AEROBIC AND ANAEROBIC 5CC  Final   Culture   Final    STAPHYLOCOCCUS SPECIES (COAGULASE NEGATIVE) Note: THE SIGNIFICANCE OF ISOLATING THIS ORGANISM FROM A SINGLE SET OF BLOOD CULTURES WHEN MULTIPLE SETS ARE DRAWN IS UNCERTAIN. PLEASE NOTIFY THE MICROBIOLOGY DEPARTMENT WITHIN ONE WEEK IF SPECIATION AND SENSITIVITIES ARE REQUIRED. Note: Gram Stain Report Called to,Read Back By and Verified With: MONICA KARLEN ON 1.13.2016 AT 11:16P BY WILEJ Performed at [facility]    Report Status 12/17/2014 FINAL  Final  Blood Culture (routine x 2)     Status: None   Collection  Time: 12/14/14  5:50 PM  Result Value Ref Range Status   Specimen Description BLOOD RIGHT ARM  Final   Special Requests BOTTLES DRAWN AEROBIC AND ANAEROBIC 5CC  Final   Culture   Final    NO GROWTH 5 DAYS Performed at [facility]    Report Status 12/21/2014 FINAL  Final  Urine culture     Status: None   Collection Time: 12/14/14  6:24 PM  Result Value Ref Range Status   Specimen Description URINE, CATHETERIZED  Final   Special Requests NONE  Final   Colony Count NO GROWTH Performed at [facility]   Final   Culture NO  GROWTH Performed at [facility]   Final   Report Status 12/15/2014 FINAL  Final  Culture, blood (routine x 2)     Status: None   Collection Time: 12/17/14 10:32 AM  Result Value Ref Range Status   Specimen Description BLOOD RIGHT HAND  Final   Special Requests BOTTLES DRAWN AEROBIC ONLY 3CC  Final   Culture   Final    NO GROWTH 5 DAYS Performed at [facility]    Report Status 12/23/2014 FINAL  Final  Culture, blood (routine x 2)     Status: None   Collection Time: 12/17/14 10:41 AM  Result Value Ref Range Status   Specimen Description BLOOD LEFT HAND  Final   Special Requests BOTTLES DRAWN AEROBIC ONLY 5CC  Final   Culture   Final    NO GROWTH 5 DAYS Performed at [facility]    Report Status 12/23/2014 FINAL  Final    Coagulation Studies:  Recent Labs  12/28/14 1253  LABPROT 13.8  INR 1.05    Imaging: Dg Chest Port 1 View  12/28/2014   CLINICAL DATA:  Altered mental status.  EXAM: PORTABLE CHEST - 1 VIEW  COMPARISON:  December 14, 2014.  FINDINGS: Stable cardiomediastinal silhouette. No pneumothorax or pleural effusion is noted. No acute pulmonary disease is noted. Bony thorax is intact.  IMPRESSION: No acute cardiopulmonary abnormality seen.   Electronically Signed   By: Roque Lias M.D.   On: 12/28/2014 13:11       Assessment and plan discussed with with attending physician and they are in agreement.    Felicie Morn PA-C [facility] [phone]  12/28/2014, 1:22 PM   Assessment: 81 y.o. female with recent right watershed CVA in setting of stenosed right M1 who presented to ED today with syncope and possible worsening of previous stroke symptoms. Her exam is relatively similar to her previous documented exam but even if there had been extension, I am not sure there would be much to change. At this time, I would favor avoiding severe hypotension and workup for hypotension.   Stroke Risk Factors - hypertension   1) continue  asa+plavix 2) Stroke team to follow.   Ritta Slot, MD [facility] [phone]  If 7pm- 7am, please page neurology on call as listed in [facility].

## 2014-12-28 NOTE — ED Provider Notes (Signed)
CSN: [id number]     Arrival date & time 12/28/14  1152 History   First MD Initiated Contact with Patient 12/28/14 1156     Chief Complaint  Patient presents with  . Altered Mental Status     (Consider location/radiation/quality/duration/timing/severity/associated sxs/prior Treatment) HPI  Patient has dementia, level V caveat. History of present illness provided by the patient's daughters, one of whom was with the patient earlier today, when her change in condition was observed. According to the patient's daughter she has been at rehabilitation facility for 1 week since discharge from our facility. She has been doing generally well.  Approximately 4.5 hours prior to my evaluation the patient was last seen in her new normal condition, with left-sided deficits, left facial droop, aphasia. Subsequently, the patient was found to have decreased interactivity, increased speech difficulty, increasing disorientation. Patient was also found to be hypotensive, minimally interactive. Per EMS, patient was not hypoglycemic. Patient was sent here for evaluation. According to family the patient has been taking aspirin, Plavix regularly since discharge one week ago. The patient herself complains of only right superior distal shoulder pain.   Past Medical History  Diagnosis Date  . Allergy   . Anxiety   . Insomnia   . Osteoarthritis   . Anemia   . Obesity   . Diastolic dysfunction   . CKD (chronic kidney disease)   . Mitral stenosis     with moderate MR on echo 4/14  . Aortic stenosis     mild by echo 4/14  . Aortic regurgitation     mild to moderate by echo 4/14  . HTN (hypertension)   . Stroke    Past Surgical History  Procedure Laterality Date  . Eye surgery    . Abdominal hysterectomy    . Appendectomy    . Cataract extraction, bilateral    . Tee without cardioversion N/A 04/13/2014    Procedure: TRANSESOPHAGEAL ECHOCARDIOGRAM (TEE);  Surgeon: Vesta Mixer, MD;  Location: [facility]  ENDOSCOPY;  Service: Cardiovascular;  Laterality: N/A;   Family History  Problem Relation Age of Onset  . Hypertension Mother   . CVA Mother    History  Substance Use Topics  . Smoking status: Former Smoker -- 0.25 packs/day for 1 years  . Smokeless tobacco: Never Used     Comment: smoked 1-3 cigs/day in school for about a year some days only  . Alcohol Use: No   OB History    No data available     Review of Systems  Unable to perform ROS: Mental status change      Allergies  Fentanyl  Home Medications   Prior to Admission medications   Medication Sig Start Date End Date Taking? Authorizing Provider  acetaminophen (TYLENOL) 500 MG tablet Take 1,000 mg by mouth every 6 (six) hours as needed for mild pain or moderate pain.    Historical Provider, MD  ALPRAZolam Prudy Feeler) 0.25 MG tablet Take 1 tablet (0.25 mg total) by mouth 2 (two) times daily as needed for anxiety. 12/22/14   Huey Bienenstock, MD  aspirin 81 MG chewable tablet Chew 4 tablets (324 mg total) by mouth daily. 11/17/14   Rhetta Mura, MD  atorvastatin (LIPITOR) 80 MG tablet Take 1 tablet (80 mg total) by mouth daily at 6 PM. 11/17/14   Rhetta Mura, MD  bisacodyl (DULCOLAX) 5 MG EC tablet Take 5 mg by mouth as needed for severe constipation.    Historical Provider, MD  carboxymethylcellulose (REFRESH) 1 % ophthalmic  solution Apply 1 drop to eye at bedtime.    Historical Provider, MD  cholecalciferol (VITAMIN D) 1000 UNITS tablet Take 1,000 Units by mouth daily.    Historical Provider, MD  clopidogrel (PLAVIX) 75 MG tablet Take 1 tablet (75 mg total) by mouth daily. 11/17/14   Rhetta Mura, MD  diclofenac sodium (VOLTAREN) 1 % GEL Apply 4 g topically 3 (three) times daily.    Historical Provider, MD  doxazosin (CARDURA) 8 MG tablet Take 8 mg by mouth at bedtime.    Historical Provider, MD  ENSURE (ENSURE) Take 237 mLs by mouth 3 (three) times daily between meals.    Historical Provider, MD    ferrous sulfate 325 (65 FE) MG tablet Take 325 mg by mouth daily with breakfast.    Historical Provider, MD  folic acid (FOLVITE) 1 MG tablet Take 1 mg by mouth daily.    Historical Provider, MD  hydrALAZINE (APRESOLINE) 50 MG tablet Take 75 mg by mouth 3 (three) times daily. Take every 8 hours for hypertension - hold for SBP <110    Historical Provider, MD  NON FORMULARY Inhale into the lungs at bedtime. Oxygen---Nightly    Historical Provider, MD  Polyethyl Glycol-Propyl Glycol (SYSTANE) 0.4-0.3 % SOLN Apply 1 drop to eye 3 (three) times daily.    Historical Provider, MD  polyethylene glycol (MIRALAX / GLYCOLAX) packet Take 17 g by mouth at bedtime.    Historical Provider, MD  potassium chloride SA (K-DUR,KLOR-CON) 20 MEQ tablet Take 20 mEq by mouth once.    Historical Provider, MD  predniSONE (DELTASONE) 5 MG tablet Take 5 mg by mouth daily with breakfast.    Historical Provider, MD  traMADol (ULTRAM) 50 MG tablet Take 1 tablet (50 mg total) by mouth every 6 (six) hours as needed for moderate pain. 12/22/14   Huey Bienenstock, MD  verapamil (CALAN-SR) 120 MG CR tablet Take 1 tablet (120 mg total) by mouth daily. 05/24/14   Georgann Housekeeper, MD   BP 97/38 mmHg  Pulse 75  Temp(Src) 97.9 F (36.6 C) (Oral)  Resp 19  SpO2 98% Physical Exam  Constitutional: She is oriented to person, place, and time. She appears cachectic. She has a sickly appearance.  HENT:  Head: Normocephalic and atraumatic.  Left facial droop, with aphasia minimal speech, difficult to understand  Eyes: Conjunctivae and EOM are normal.  Neck: No tracheal deviation present.  Cardiovascular: Normal rate and regular rhythm.   Murmur heard. Pulmonary/Chest: Effort normal and breath sounds normal. No stridor. No respiratory distress.  Abdominal: She exhibits no distension. There is no tenderness.  Musculoskeletal: She exhibits no edema.       Right shoulder: She exhibits tenderness, bony tenderness and pain. She exhibits normal  range of motion, no swelling, no effusion, no crepitus, no deformity and no laceration.       Arms: Neurological: She is alert and oriented to person, place, and time. She displays atrophy. She displays no tremor. No cranial nerve deficit. She exhibits abnormal muscle tone. She displays no seizure activity. Coordination abnormal.  Left hemi-plegia, left facial droop, very difficult to understand speech. Patient is grossly oriented to place, season, but not to date or time  Skin: Skin is warm and dry.  Psychiatric: She has a normal mood and affect.  Nursing note and vitals reviewed.   ED Course  Procedures (including critical care time) Labs Review Labs Reviewed  CBC WITH DIFFERENTIAL/PLATELET - Abnormal; Notable for the following:    WBC 12.0 (*)  RDW 15.9 (*)    Neutrophils Relative % 85 (*)    Neutro Abs 10.2 (*)    Lymphocytes Relative 10 (*)    All other components within normal limits  COMPREHENSIVE METABOLIC PANEL - Abnormal; Notable for the following:    Glucose, Bld 154 (*)    Creatinine, Ser 1.26 (*)    AST 40 (*)    GFR calc non Af Amer 38 (*)    GFR calc Af Amer 44 (*)    All other components within normal limits  LACTIC ACID, PLASMA - Abnormal; Notable for the following:    Lactic Acid, Venous 3.0 (*)    All other components within normal limits  CBG MONITORING, ED - Abnormal; Notable for the following:    Glucose-Capillary 145 (*)    All other components within normal limits  I-STAT CHEM 8, ED - Abnormal; Notable for the following:    Glucose, Bld 156 (*)    Hemoglobin 16.3 (*)    HCT 48.0 (*)    All other components within normal limits  PROTIME-INR  TROPONIN I  ETHANOL  URINALYSIS, ROUTINE W REFLEX MICROSCOPIC  URINE RAPID DRUG SCREEN (HOSP PERFORMED)  I-STAT TROPOININ, ED    Imaging Review Ct Head Wo Contrast  12/28/2014   CLINICAL DATA:  A 39-year-old female with left-sided facial droop and left-sided weakness. Slurred speech. Possible stroke.  History of cerebral vascular accident in December 2015.  EXAM: CT HEAD WITHOUT CONTRAST  TECHNIQUE: Contiguous axial images were obtained from the base of the skull through the vertex without intravenous contrast.  COMPARISON:  Brain MRI 12/15/2014.  Head CT 12/14/2014.  FINDINGS: Patchy and confluent areas of decreased attenuation are noted throughout the deep and periventricular white matter of the cerebral hemispheres bilaterally, compatible with chronic microvascular ischemic disease. More confluent low attenuation throughout the watershed distribution in the right cerebral hemisphere, similar to prior examination from 12/14/2014, compatible with prior watershed territory infarction. Subtle areas of acute ischemia in this region would be impossible to discern on today's CT examination. No signs of acute intracranial hemorrhage, and no large territory ischemic changes identified on today's examination. Old infarct in the right cerebellar hemisphere is unchanged. No mass, mass effect, hydrocephalus or abnormal intra or extra-axial fluid collections. Visualized paranasal sinuses and mastoids are well pneumatized. No acute displaced skull fractures are identified.  IMPRESSION: 1. Today's head CT is essentially unchanged in appearance compared to the prior study from 12/14/2014 again demonstrating extensive chronic microvascular ischemic changes in the cerebral white matter bilaterally, particularly in the watershed distribution in the right cerebral hemisphere, as above. These results were called by telephone at the time of interpretation on 12/28/2014 at 1:21 pm to Dr. Amada Jupiter, who verbally acknowledged these results.   Electronically Signed   By: Trudie Reed M.D.   On: 12/28/2014 13:22   Dg Chest Port 1 View  12/28/2014   CLINICAL DATA:  Altered mental status.  EXAM: PORTABLE CHEST - 1 VIEW  COMPARISON:  December 14, 2014.  FINDINGS: Stable cardiomediastinal silhouette. No pneumothorax or pleural  effusion is noted. No acute pulmonary disease is noted. Bony thorax is intact.  IMPRESSION: No acute cardiopulmonary abnormality seen.   Electronically Signed   By: Roque Lias M.D.   On: 12/28/2014 13:11   After my initial evaluation I again confirmed the chronology of the patient's deficits with the family. A review of her chart, includes discharge note from last admission stating that the patient was not compliant with  medication following her initial stroke hospitalization.   Given the passage of only 4.5 hours since last seen normal time, patient was declared a code stroke. Evaluation was conducted with our neurology colleagues.  After patient returned from CT scan, discussed the patient's case length. No indication for acute intervention. With the reported episode of hypotension, syncope, patient may have had additional low perfusion transiently.  MDM   Final diagnoses:  Disorientation  Acute kidney injury    This patient with history of recent stroke now presents after a change in mental status, as well as an episode of possible syncope. Patient's evaluation here does not suggest opportunity for emergent neurologic intervention, but the patient does have evidence for acute kidney injury consistent with dehydration, also contributing to her episode. Patient received fluid rehydration, was admitted for further evaluation, management.    Gerhard Munch, MD 12/28/14 1444

## 2014-12-28 NOTE — ED Notes (Signed)
Lab called lactic acid 3.0 EDP notified.

## 2014-12-28 NOTE — ED Notes (Signed)
Attempted report 

## 2014-12-28 NOTE — ED Notes (Signed)
MD and RN at bedside talking with pt's family. Code stroke to be called.

## 2014-12-28 NOTE — ED Notes (Signed)
Pt's daughter at bedside and per her; daughter reports pt was diaphoretic and unresponsive with systolic in 70's by PT 1000 this morning. Was seen at breakfast and was reported by staff to be ok.

## 2014-12-28 NOTE — ED Notes (Signed)
Pt presents from [facility] re[facility] facility for previous stroke in December with left sided deficits. Was here last week tx for UTI. Lethargy increasing per daughter. Per daughter seems to be more at baseline. Speech is slurred. Takes aspirin and plavix. Trouble with urinary retention.

## 2014-12-28 NOTE — ED Notes (Signed)
Pt to go to scanner 3; pleb aware.

## 2014-12-29 DIAGNOSIS — R339 Retention of urine, unspecified: Secondary | ICD-10-CM

## 2014-12-29 DIAGNOSIS — I10 Essential (primary) hypertension: Secondary | ICD-10-CM

## 2014-12-29 DIAGNOSIS — R410 Disorientation, unspecified: Secondary | ICD-10-CM

## 2014-12-29 DIAGNOSIS — I5032 Chronic diastolic (congestive) heart failure: Secondary | ICD-10-CM

## 2014-12-29 LAB — BASIC METABOLIC PANEL
ANION GAP: 10 (ref 5–15)
BUN: 10 mg/dL (ref 6–23)
CALCIUM: 9.3 mg/dL (ref 8.4–10.5)
CO2: 21 mmol/L (ref 19–32)
CREATININE: 0.84 mg/dL (ref 0.50–1.10)
Chloride: 110 mmol/L (ref 96–112)
GFR, EST AFRICAN AMERICAN: 72 mL/min — AB (ref >90)
GFR, EST NON AFRICAN AMERICAN: 63 mL/min — AB (ref >90)
GLUCOSE: 90 mg/dL (ref 70–99)
POTASSIUM: 3.7 mmol/L (ref 3.5–5.1)
Sodium: 141 mmol/L (ref 135–145)

## 2014-12-29 LAB — CBC
HCT: 37.4 % (ref 36.0–46.0)
Hemoglobin: 12.3 g/dL (ref 12.0–15.0)
MCH: 31.9 pg (ref 26.0–34.0)
MCHC: 32.9 g/dL (ref 30.0–36.0)
MCV: 96.9 fL (ref 78.0–100.0)
Platelets: 228 10*3/uL (ref 150–400)
RBC: 3.86 MIL/uL — ABNORMAL LOW (ref 3.87–5.11)
RDW: 16.1 % — AB (ref 11.5–15.5)
WBC: 7.9 10*3/uL (ref 4.0–10.5)

## 2014-12-29 MED ORDER — DOXAZOSIN MESYLATE 8 MG PO TABS
8.0000 mg | ORAL_TABLET | Freq: Every day | ORAL | Status: DC
  Administered 2014-12-29: 8 mg via ORAL
  Filled 2014-12-29: qty 1

## 2014-12-29 MED ORDER — TAMSULOSIN HCL 0.4 MG PO CAPS: 0.4000 mg | ORAL_CAPSULE | Freq: Every day | ORAL | Status: DC

## 2014-12-29 MED ORDER — CIPROFLOXACIN HCL 250 MG PO TABS: 250.0000 mg | ORAL_TABLET | Freq: Two times a day (BID) | ORAL | Status: DC

## 2014-12-29 MED ORDER — ASPIRIN 300 MG RE SUPP
300.0000 mg | Freq: Every day | RECTAL | Status: DC
  Filled 2014-12-29: qty 1

## 2014-12-29 MED ORDER — PANTOPRAZOLE SODIUM 20 MG PO TBEC
20.0000 mg | DELAYED_RELEASE_TABLET | Freq: Every day | ORAL | Status: DC
  Administered 2014-12-29: 20 mg via ORAL
  Filled 2014-12-29: qty 1

## 2014-12-29 MED ORDER — ASPIRIN 81 MG PO CHEW: 81.0000 mg | CHEWABLE_TABLET | Freq: Every day | ORAL | Status: DC

## 2014-12-29 MED ORDER — ASPIRIN 325 MG PO TABS
325.0000 mg | ORAL_TABLET | Freq: Every day | ORAL | Status: DC
  Administered 2014-12-29: 325 mg via ORAL
  Filled 2014-12-29: qty 1

## 2014-12-29 NOTE — Progress Notes (Signed)
Utilization review completed. Alexys Lobello, RN, BSN. 

## 2014-12-29 NOTE — Evaluation (Signed)
Occupational Therapy Evaluation Patient Details Name: Monica Blankenship MRN: [id number] DOB: 11-21-1929 Today's Date: 12/29/2014    History of Present Illness Monica Blankenship is a 81 y.o. 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 Patient lives in a skilled nursing facility, was noticed to be diaphoretic, and had episode of syncope, then she was confused after that, systolic blood pressure was in the 70s, EMS was called when the daughter arrived and noticed her baseline weakness appears to be worse, code stroke was called on arrival, CT head was done which did not show any evidence of acute stroke. Pt was dehydrated with UTI, chest xray neg.   Clinical Impression   Pt was requiring assistance with all ADL and mobility prior to admission.  She is functioning at the same level as prior to admission.  Will defer further OT to SNF.      Follow Up Recommendations  SNF;Supervision/Assistance - 24 hour    Equipment Recommendations       Recommendations for Other Services       Precautions / Restrictions Precautions Precautions: Fall Precaution Comments: old CVA w/ dense L hemiplegia, L neglect, decreased attention Restrictions Weight Bearing Restrictions: No      Mobility Bed Mobility Overal bed mobility: Needs Assistance Bed Mobility: Rolling;Sidelying to Sit Rolling: Mod assist Sidelying to sit: Mod assist       General bed mobility comments: Pt requires assist for BLEs out of bed, assist for hips and assist to elevate trunk.  She did initiate movement of LEs and was able to reach across for bed rail.   Transfers Overall transfer level: Needs assistance Equipment used: 2 person hand held assist Transfers: Sit to/from [employer] Sit to Stand: +2 physical assistance;Mod assist Stand pivot transfers: +2 physical assistance;Max assist        General transfer comment: flexed posture, strong posterior lean, decreased weight shift to L LE to be able to step with R, but pivoted R foot    Balance Overall balance assessment: Needs assistance Sitting-balance support: No upper extremity supported Sitting balance-Leahy Scale: Poor Sitting balance - Comments: requires close supervision     Standing balance-Leahy Scale: Zero Standing balance comment: leans against bed                            ADL Overall ADL's : Needs assistance/impaired Eating/Feeding: NPO (awaiting swallowing eval)   Grooming: Wash/dry face;Wash/dry hands;Minimal assistance;Sitting   Upper Body Bathing: Maximal assistance;Sitting   Lower Body Bathing: Total assistance   Upper Body Dressing : Total assistance   Lower Body Dressing: Total assistance   Toilet Transfer: +2 for physical assistance;Maximal assistance;Stand-pivot (simulated to chair)   Toileting- Clothing Manipulation and Hygiene: Total assistance         General ADL Comments: Assist needed for pt to locate her L UE to protect it when rolling.     Vision                 Additional Comments: R gaze preference, L neglect   Perception     Praxis      Pertinent Vitals/Pain Pain Assessment: No/denies pain     Hand Dominance Left   Extremity/Trunk Assessment Upper Extremity Assessment Upper Extremity Assessment: LUE deficits/detail LUE Deficits / Details: Pt able to move her fingers slightly, trace elbow contraction, 1/2  fingers width shoulder subluxation LUE Sensation: decreased proprioception LUE Coordination: decreased fine motor;decreased gross motor (no functional use)   Lower Extremity Assessment Lower Extremity Assessment: Defer to PT evaluation   Cervical / Trunk Assessment Cervical / Trunk Exceptions: keeps head turned to the R, but can turn it full range with maximal verbal cues to follow target   Communication Communication Communication:  Expressive difficulties   Cognition Arousal/Alertness: Awake/alert Behavior During Therapy: WFL for tasks assessed/performed Overall Cognitive Status: No family/caregiver present to determine baseline cognitive functioning                 General Comments: Pt able to state that she was in rehab at [facility] and what she was working on in therapy.   General Comments       Exercises       Shoulder Instructions      Home Living Family/patient expects to be discharged to:: Skilled nursing facility                                        Prior Functioning/Environment Level of Independence: Needs assistance  Gait / Transfers Assistance Needed: pt reports she was walking with a cane with assist at SNF ADL's / Homemaking Assistance Needed: Assisted for bathing, dressing, toileting.  Was self feeding and grooming with her R UE.        OT Diagnosis: Generalized weakness;Hemiplegia dominant side;Cognitive deficits;Disturbance of vision;Altered mental status   OT Problem List: Decreased strength;Decreased range of motion;Decreased activity tolerance;Impaired balance (sitting and/or standing);Impaired vision/perception;Decreased coordination;Decreased cognition;Decreased safety awareness;Decreased knowledge of use of DME or AE;Decreased knowledge of precautions;Impaired UE functional use;Pain;Increased edema   OT Treatment/Interventions:      OT Goals(Current goals can be found in the care plan section) Acute Rehab OT Goals Patient Stated Goal: not stated  OT Frequency:     Barriers to D/C:            Co-evaluation PT/OT/SLP Co-Evaluation/Treatment: Yes Reason for Co-Treatment: For patient/therapist safety   OT goals addressed during session: ADL's and self-care      End of Session Nurse Communication: Mobility status  Activity Tolerance: Patient tolerated treatment well Patient left: in chair;with call bell/phone within reach;with chair alarm set    Time: 6389-3734 OT Time Calculation (min): 28 min Charges:  OT General Charges $OT Visit: 1 Procedure OT Evaluation $Initial OT Evaluation Tier I: 1 Procedure G-Codes:    Evern Bio 12/29/2014, 11:03 [id number]

## 2014-12-29 NOTE — Discharge Instructions (Signed)
Syncope °Syncope is a medical term for fainting or passing out. This means you lose consciousness and drop to the ground. People are generally unconscious for less than 5 minutes. You may have some muscle twitches for up to 15 seconds before waking up and returning to normal. Syncope occurs more often in older adults, but it can happen to anyone. While most causes of syncope are not dangerous, syncope can be a sign of a serious medical problem. It is important to seek medical care.  °CAUSES  °Syncope is caused by a sudden drop in blood flow to the brain. The specific cause is often not determined. Factors that can bring on syncope include: °· Taking medicines that lower blood pressure. °· Sudden changes in posture, such as standing up quickly. °· Taking more medicine than prescribed. °· Standing in one place for too long. °· Seizure disorders. °· Dehydration and excessive exposure to heat. °· Low blood sugar (hypoglycemia). °· Straining to have a bowel movement. °· Heart disease, irregular heartbeat, or other circulatory problems. °· Fear, emotional distress, seeing blood, or severe pain. °SYMPTOMS  °Right before fainting, you may: °· Feel dizzy or light-headed. °· Feel nauseous. °· See all white or all black in your field of vision. °· Have cold, clammy skin. °DIAGNOSIS  °Your health care provider will ask about your symptoms, perform a physical exam, and perform an electrocardiogram (ECG) to record the electrical activity of your heart. Your health care provider may also perform other heart or blood tests to determine the cause of your syncope which may include: °· Transthoracic echocardiogram (TTE). During echocardiography, sound waves are used to evaluate how blood flows through your heart. °· Transesophageal echocardiogram (TEE). °· Cardiac monitoring. This allows your health care provider to monitor your heart rate and rhythm in real time. °· Holter monitor. This is a portable device that records your  heartbeat and can help diagnose heart arrhythmias. It allows your health care provider to track your heart activity for several days, if needed. °· Stress tests by exercise or by giving medicine that makes the heart beat faster. °TREATMENT  °In most cases, no treatment is needed. Depending on the cause of your syncope, your health care provider may recommend changing or stopping some of your medicines. °HOME CARE INSTRUCTIONS °· Have someone stay with you until you feel stable. °· Do not drive, use machinery, or play sports until your health care provider says it is okay. °· Keep all follow-up appointments as directed by your health care provider. °· Lie down right away if you start feeling like you might faint. Breathe deeply and steadily. Wait until all the symptoms have passed. °· Drink enough fluids to keep your urine clear or pale yellow. °· If you are taking blood pressure or heart medicine, get up slowly and take several minutes to sit and then stand. This can reduce dizziness. °SEEK IMMEDIATE MEDICAL CARE IF:  °· You have a severe headache. °· You have unusual pain in the chest, abdomen, or back. °· You are bleeding from your mouth or rectum, or you have black or tarry stool. °· You have an irregular or very fast heartbeat. °· You have pain with breathing. °· You have repeated fainting or seizure-like jerking during an episode. °· You faint when sitting or lying down. °· You have confusion. °· You have trouble walking. °· You have severe weakness. °· You have vision problems. °If you fainted, call your local emergency services (911 in [place] Do not drive   yourself to the hospital.  °MAKE SURE YOU: °· Understand these instructions. °· Will watch your condition. °· Will get help right away if you are not doing well or get worse. °Document Released: 11/19/2005 Document Revised: 11/24/2013 Document Reviewed: 01/18/2012 °[employer]® Patient Information ©2015 [employer]. This information is not intended to replace  advice given to you by your health care provider. Make sure you discuss any questions you have with your health care provider. ° °

## 2014-12-29 NOTE — Evaluation (Signed)
Physical Therapy Evaluation Patient Details Name: Monica Blankenship MRN: [id number] DOB: [birth year] Today's Date: 12/29/2014   History of Present Illness  Monica Blankenship is a 81 y.o. 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 Patient lives in a skilled nursing facility, was noticed to be diaphoretic, and had episode of syncope, then she was confused after that, systolic blood pressure was in the 70s, EMS was called when the daughter arrived and noticed her baseline weakness appears to be worse, code stroke was called on arrival, CT head was done which did not show any evidence of acute stroke. Pt was dehydrated with UTI, chest xray neg.  Clinical Impression  Pt admitted with above diagnosis. Pt currently with functional limitations due to the deficits listed below (see PT Problem List). Pt transferred bed to chair with +2 max A, stand pivot transfer.  Pt will benefit from skilled PT to increase their independence and safety with mobility to allow discharge to the venue listed below.       Follow Up Recommendations SNF;Supervision/Assistance - 24 hour    Equipment Recommendations  None recommended by PT    Recommendations for Other Services       Precautions / Restrictions Precautions Precautions: Fall Precaution Comments: old CVA w/ dense L hemiplegia, L neglect, decreased attention Restrictions Weight Bearing Restrictions: No      Mobility  Bed Mobility Overal bed mobility: Needs Assistance Bed Mobility: Rolling;Sidelying to Sit Rolling: Mod assist Sidelying to sit: Mod assist       General bed mobility comments: cues for pt to grasp LUE with RUE which pt was able to do, pt was able to initiate rolling but needed mod A for full SL position and mvmt of legs as well as elevation of trunk  Transfers Overall transfer level: Needs assistance Equipment  used: 2 person hand held assist Transfers: Sit to/from [employer] Sit to Stand: +2 physical assistance;Mod assist Stand pivot transfers: +2 physical assistance;Max assist       General transfer comment: strong posterior lean in standing and pt very insecure when wt brought fwd to center, feeling that she would fall fwd. Difficulty with left WT-shift and moving LLE but able to take small steps with vc's and manual facilitation at left hip  Ambulation/Gait             General Gait Details: unable due to heavy posterior lean and trunk flexion  Stairs            Wheelchair Mobility    Modified Rankin (Stroke Patients Only)       Balance Overall balance assessment: Needs assistance Sitting-balance support: Single extremity supported;No upper extremity supported;Feet supported Sitting balance-Leahy Scale: Poor Sitting balance - Comments: pt required RUE support to maintain balance with initial sitting as well as min-guard A. As pt sat longer, she was able to sit without UE support without losing balance, unable to accept challenges, especially to left, unable to wt-shift, right gaze. Worked on crossing midline with gaze in sitting, which pt was able to do but immediately turned back to right when not cued.  Postural control: Posterior lean Standing balance support: Bilateral upper extremity supported Standing balance-Leahy Scale: Zero Standing balance comment: achieved full stand but posterior lean  Pertinent Vitals/Pain Pain Assessment: No/denies pain  VSS    Home Living Family/patient expects to be discharged to:: Skilled nursing facility Living Arrangements: Non-relatives/Friends                    Prior Function Level of Independence: Needs assistance   Gait / Transfers Assistance Needed: pt reports she was walking with a cane with assist at SNF  ADL's / Homemaking Assistance Needed: Assisted for  bathing, dressing, toileting.  Was self feeding and grooming with her R UE.  Comments: pt has been at [facility] SNF since CVA     Hand Dominance   Dominant Hand: Left    Extremity/Trunk Assessment   Upper Extremity Assessment: Defer to OT evaluation       LUE Deficits / Details: Pt able to move her fingers slightly, trace elbow contraction, 1/2 fingers width shoulder subluxation   Lower Extremity Assessment: RLE deficits/detail;LLE deficits/detail RLE Deficits / Details: generalized weakness LLE Deficits / Details: 2+/5 throughout LLE  Cervical / Trunk Assessment: Kyphotic  Communication   Communication: Expressive difficulties  Cognition Arousal/Alertness: Awake/alert Behavior During Therapy: WFL for tasks assessed/performed Overall Cognitive Status: No family/caregiver present to determine baseline cognitive functioning Area of Impairment: Memory Orientation Level: Disoriented to;Time Current Attention Level: Selective Memory: Decreased short-term memory Following Commands: Follows one step commands consistently;Follows multi-step commands inconsistently;Follows one step commands with increased time     Problem Solving: Slow processing;Requires verbal cues;Requires tactile cues General Comments: pt appropriate with conversation, STM deficits evident and once pt thought husband was in room when he was not.     General Comments      Exercises General Exercises - Lower Extremity Ankle Circles/Pumps: AROM;Both;10 reps;Seated      Assessment/Plan    PT Assessment Patient needs continued PT services  PT Diagnosis Difficulty walking;Generalized weakness;Hemiplegia dominant side   PT Problem List Decreased strength;Decreased range of motion;Decreased activity tolerance;Decreased balance;Decreased mobility;Decreased coordination;Decreased cognition;Decreased knowledge of precautions  PT Treatment Interventions DME instruction;Gait training;Functional mobility  training;Therapeutic activities;Therapeutic exercise;Balance training;Neuromuscular re-education;Cognitive remediation;Patient/family education   PT Goals (Current goals can be found in the Care Plan section) Acute Rehab PT Goals Patient Stated Goal: return to Monica Blankenship and continue to work on walking PT Goal Formulation: With patient/family Time For Goal Achievement: 01/13/16 Potential to Achieve Goals: Fair    Frequency Min 2X/week   Barriers to discharge        Co-evaluation PT/OT/SLP Co-Evaluation/Treatment: Yes Reason for Co-Treatment: For patient/therapist safety;Complexity of the patient's impairments (multi-system involvement) PT goals addressed during session: Mobility/safety with mobility;Balance;Strengthening/ROM OT goals addressed during session: ADL's and self-care       End of Session Equipment Utilized During Treatment: Gait belt Activity Tolerance: Patient tolerated treatment well Patient left: in chair;with chair alarm set;with call bell/phone within reach Nurse Communication: Mobility status         Time: 6063-0160 PT Time Calculation (min) (ACUTE ONLY): 30 min   Charges:   PT Evaluation $Initial PT Evaluation Tier I: 1 Procedure     PT G Codes:      Lyanne Co, PT  Acute Rehab Services  [phone]   Lyanne Co 12/29/2014, 12:05 PM

## 2014-12-29 NOTE — Clinical Social Work Note (Signed)
Patient will discharge to [facility] Anticipated discharge date: 12/29/14 Family notified: pt daughter Ms. Editor, commissioning by [employer]  CSW signing off.  Merlyn Lot, LCSWA Clinical Social Worker [phone]

## 2014-12-29 NOTE — Evaluation (Signed)
Clinical/Bedside Swallow Evaluation Patient Details  Name: Monica Blankenship MRN: [id number] Date of Birth: June 05, 1929  Today's Date: 12/29/2014 Time: SLP Start Time (ACUTE ONLY): 0920 SLP Stop Time (ACUTE ONLY): 0935 SLP Time Calculation (min) (ACUTE ONLY): 15 min  Past Medical History:  Past Medical History  Diagnosis Date  . Allergy   . Anxiety   . Insomnia   . Osteoarthritis   . Anemia   . Obesity   . Diastolic dysfunction   . CKD (chronic kidney disease)   . Mitral stenosis     with moderate MR on echo 4/14  . Aortic stenosis     mild by echo 4/14  . Aortic regurgitation     mild to moderate by echo 4/14  . HTN (hypertension)   . Stroke    Past Surgical History:  Past Surgical History  Procedure Laterality Date  . Eye surgery    . Abdominal hysterectomy    . Appendectomy    . Cataract extraction, bilateral    . Tee without cardioversion N/A 04/13/2014    Procedure: TRANSESOPHAGEAL ECHOCARDIOGRAM (TEE);  Surgeon: Vesta Mixer, MD;  Location: [facility] ENDOSCOPY;  Service: Cardiovascular;  Laterality: N/A;   HPI:  Monica Blankenship is an 81 y.o. female who has been residing at [facility] since her recent stroke in December. 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 Patient was placed on ASA and Plavix. Per daughter she has been taking all her medications. Today she was noted to suddenly become diaphoretic (+/-) syncope and then have AMS.  Pt found to have UTI.  MRI shows worsening/acute extension of watershed infarctions in the deep white matter of the right cerebral hemisphere when compared to the previous study. Prior BSE at time of CVA recomendedn dys 3 diet and thin liquids, no evidence of aspiration.    Assessment / Plan / Recommendation Clinical Impression  Pt demosntrates swallow function consistent with baseline: no evidence of aspiration, mild oral dysphagia due to left oral weakness and missing  dentition. Pt has tolerated dys 3 diet thin liquids without struggle at SNF, no new findings concerning for change. Recommend pt resume dys 3 (mechanical soft diet) with full supervision. Pills can be given whole in puree. SLP at facility to f/u.     Aspiration Risk  Mild    Diet Recommendation Dysphagia 3 (Mechanical Soft);Thin liquid   Liquid Administration via: Cup;Straw Medication Administration: Whole meds with puree Supervision: Patient able to self feed;Full supervision/cueing for compensatory strategies Compensations: Slow rate;Small sips/bites Postural Changes and/or Swallow Maneuvers: Seated upright 90 degrees    Other  Recommendations Oral Care Recommendations: Oral care BID   Follow Up Recommendations  Skilled Nursing facility    Frequency and Duration        Pertinent Vitals/Pain NA    SLP Swallow Goals     Swallow Study Prior Functional Status       General HPI: Monica Blankenship is an 81 y.o. female who has been residing at [facility] since her recent stroke in December. 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 Patient was placed on ASA and Plavix. Per daughter she has been taking all her medications. Today she was noted to suddenly become diaphoretic (+/-) syncope and then have AMS.  Pt found to have UTI.  MRI shows worsening/acute extension of watershed infarctions in the deep white matter  of the right cerebral hemisphere when compared to the previous study. Prior BSE at time of CVA recomendedn dys 3 diet and thin liquids, no evidence of aspiration.  Type of Study: Bedside swallow evaluation Diet Prior to this Study: NPO Temperature Spikes Noted: No Respiratory Status: Room air History of Recent Intubation: No Behavior/Cognition: Cooperative;Alert;Pleasant mood;Requires cueing Oral Cavity - Dentition: Edentulous Self-Feeding Abilities: Needs assist Patient Positioning: Upright in chair Baseline  Vocal Quality: Clear Volitional Cough: Strong Volitional Swallow: Able to elicit    Oral/Motor/Sensory Function Overall Oral Motor/Sensory Function: Impaired at baseline (left facial droop from prior CVA)   Ice Chips     Thin Liquid Thin Liquid: Within functional limits Presentation: Cup;Straw;Self Fed    Nectar Thick Nectar Thick Liquid: Not tested   Honey Thick Honey Thick Liquid: Not tested   Puree Puree: Within functional limits   Solid   GO    Solid: Impaired Presentation: Self Fed Oral Phase Impairments: Impaired mastication      Harlon Ditty, MA CCC-SLP [phone]  Ocie Tino, Riley Nearing 12/29/2014,10:17 AM

## 2014-12-29 NOTE — Progress Notes (Signed)
STROKE TEAM PROGRESS NOTE   HISTORY Monica Blankenship is an 81 y.o. female who has been residing at [facility] since her recent stroke in December 2015. 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 Patient was placed on ASA and Plavix. Per daughter she has been taking all her medications. Today 12/28/2014 at 0800 (LKW) she was noted to suddenly become diaphoretic (+/-) syncope and then have AMS. BP was noted to be systolic int he 70's. EMS was called and when daughter arrived she noted her previous stroke symptoms appeared worse. Patient was brought to ED as syncope but called as code stroke on arrival. CT head was obtained showing no acute stroke and while in ED her symptoms improved. Currently she is showing no worsening of previous symptoms. Patient was not administered TPA secondary to recent stroke. She was admitted for further evaluation and treatment.   SUBJECTIVE (INTERVAL HISTORY) Her daughter is at the bedside.  Overall she feels her condition is gradually improving. Daughter is upset that she was not getting optimal care at the nursing home. I advised her to discuss this with the primary team and case manager OBJECTIVE Temp:  [97.7 F (36.5 C)-98.4 F (36.9 C)] 98.2 F (36.8 C) (01/27 0343) Pulse Rate:  [61-78] 76 (01/27 0343) Cardiac Rhythm:  [-] Normal sinus rhythm;Heart block (01/27 0735) Resp:  [12-25] 18 (01/27 0343) BP: (97-149)/(30-67) 149/61 mmHg (01/27 0343) SpO2:  [94 %-100 %] 97 % (01/27 0343) Weight:  [77.111 kg (170 lb)] 77.111 kg (170 lb) (01/26 1609)   Recent Labs Lab 12/28/14 1250  GLUCAP 145*    Recent Labs Lab 12/28/14 1253 12/28/14 1307 12/28/14 1653 12/29/14 0530  NA 141 143  --  141  K 4.0 3.9  --  3.7  CL 105 104  --  110  CO2 22  --   --  21  GLUCOSE 154* 156*  --  90  BUN 14 16  --  10  CREATININE 1.26* 1.10 0.95 0.84  CALCIUM 10.4  --   --  9.3    Recent Labs Lab 12/28/14 1253   AST 40*  ALT 29  ALKPHOS 77  BILITOT 1.2  PROT 8.2  ALBUMIN 4.0    Recent Labs Lab 12/28/14 1253 12/28/14 1307 12/28/14 1653 12/29/14 0530  WBC 12.0*  --  10.1 7.9  NEUTROABS 10.2*  --   --   --   HGB 14.4 16.3* 13.6 12.3  HCT 42.8 48.0* 40.3 37.4  MCV 97.1  --  96.9 96.9  PLT 217  --  238 228    Recent Labs Lab 12/28/14 1253  TROPONINI 0.03    Recent Labs  12/28/14 1253  LABPROT 13.8  INR 1.05    Recent Labs  12/28/14 1433  COLORURINE YELLOW  LABSPEC 1.015  PHURINE 6.0  GLUCOSEU NEGATIVE  HGBUR NEGATIVE  BILIRUBINUR NEGATIVE  KETONESUR NEGATIVE  PROTEINUR NEGATIVE  UROBILINOGEN 0.2  NITRITE NEGATIVE  LEUKOCYTESUR SMALL*       Component Value Date/Time   CHOL 129 11/16/2014 0620   TRIG 69 11/16/2014 0620   HDL 40 11/16/2014 0620   CHOLHDL 3.2 11/16/2014 0620   VLDL 14 11/16/2014 0620   LDLCALC 75 11/16/2014 0620   Lab Results  Component Value Date   HGBA1C 5.7* 11/15/2014      Component Value Date/Time   LABOPIA NONE DETECTED 12/28/2014 1433   COCAINSCRNUR NONE DETECTED 12/28/2014 1433  LABBENZ NONE DETECTED 12/28/2014 1433   AMPHETMU NONE DETECTED 12/28/2014 1433   THCU NONE DETECTED 12/28/2014 1433   LABBARB NONE DETECTED 12/28/2014 1433     Recent Labs Lab 12/28/14 1258  ETH <5    Ct Head Wo Contrast  12/28/2014   CLINICAL DATA:  A 30-year-old female with left-sided facial droop and left-sided weakness. Slurred speech. Possible stroke. History of cerebral vascular accident in December 2015.  EXAM: CT HEAD WITHOUT CONTRAST  TECHNIQUE: Contiguous axial images were obtained from the base of the skull through the vertex without intravenous contrast.  COMPARISON:  Brain MRI 12/15/2014.  Head CT 12/14/2014.  FINDINGS: Patchy and confluent areas of decreased attenuation are noted throughout the deep and periventricular white matter of the cerebral hemispheres bilaterally, compatible with chronic microvascular ischemic disease. More  confluent low attenuation throughout the watershed distribution in the right cerebral hemisphere, similar to prior examination from 12/14/2014, compatible with prior watershed territory infarction. Subtle areas of acute ischemia in this region would be impossible to discern on today's CT examination. No signs of acute intracranial hemorrhage, and no large territory ischemic changes identified on today's examination. Old infarct in the right cerebellar hemisphere is unchanged. No mass, mass effect, hydrocephalus or abnormal intra or extra-axial fluid collections. Visualized paranasal sinuses and mastoids are well pneumatized. No acute displaced skull fractures are identified.  IMPRESSION: 1. Today's head CT is essentially unchanged in appearance compared to the prior study from 12/14/2014 again demonstrating extensive chronic microvascular ischemic changes in the cerebral white matter bilaterally, particularly in the watershed distribution in the right cerebral hemisphere, as above. These results were called by telephone at the time of interpretation on 12/28/2014 at 1:21 pm to Dr. Amada Jupiter, who verbally acknowledged these results.   Electronically Signed   By: Trudie Reed M.D.   On: 12/28/2014 13:22   Dg Chest Port 1 View  12/28/2014   CLINICAL DATA:  Altered mental status.  EXAM: PORTABLE CHEST - 1 VIEW  COMPARISON:  December 14, 2014.  FINDINGS: Stable cardiomediastinal silhouette. No pneumothorax or pleural effusion is noted. No acute pulmonary disease is noted. Bony thorax is intact.  IMPRESSION: No acute cardiopulmonary abnormality seen.   Electronically Signed   By: Roque Lias M.D.   On: 12/28/2014 13:11   Carotid Doppler  11/16/2014 There is 1-39% bilateral ICA stenosis. Vertebral artery flow is antegrade.    PHYSICAL EXAM Frail elderly african american lady not in distress.Awake alert. Afebrile. Head is nontraumatic. Neck is supple without bruit. Hearing is normal. Cardiac exam no murmur or  gallop. Lungs are clear to auscultation. Distal pulses are well felt. Neurological Exam : Awake alert oriented 2. Diminished attention and recall. Extraocular movements are full range without nystagmus. No aphasia or dysarthria. Mild left lower facial weakness. Tongue is midline. Left hemiplegia with 0/5 left upper extremity and 2/5 left lower extremity strength. Diminished left hemibody sensation. Left plantar upgoing right downgoing. Gait was not tested. ASSESSMENT/PLAN Ms. JOSSETTE ZIRBEL is a 81 y.o. female with history of R watershed infarct d/t stenosed R M1 MCA 11/2014 with worksening stroke 12/2014 presenting again with worsening of previous stroke symptoms. She did not receive IV t-PA due to stroke within past 90 days.   Recrudescence of previous stroke symptoms, no new stroke  Carotid Doppler  11/16/2014, No significant stenosis   2D Echo  12/15/2014 no source of embolus, EF 60%, unable to assess pulmonary hypertension  Heparin 5000 units sq tid and SCDs for VTE prophylaxis  Diet NPO time specified Except for: Sips with Meds ordered. Diet adjusted to not allow pos until ST assesses given possible stroke - ST has now assessed and she is able to take a Dys 3 thin liquids diet  aspirin 81 mg orally every day and clopidogrel 75 mg orally every day prior to admission, now ordered clopidogrel 75 mg orally every day and aspirin 324 mg chewable. Adjusted aspirin to suppository given npo status. As now able to swallow, will continue meds as prior to admission. This is our recommendation for discharge.  Ongoing aggressive stroke risk factor management  No further stroke work up  Recommend return to SNF for ongoing Therapy  Disposition:  SNF in [place]  Stroke team will sign off  Keep followup appt that should have been scheduled by Dr. Marlis Edelson office at the time of her last discharge.  Hypertension  Hyperlipidemia  Home meds:  lipitor 80, resumed in hospital  LDL 75 in Dec, goal  < 70  Continue statin at discharge  Diabetes  HgbA1c 5.7 11/2014, at goal < 7.0  Controlled  Other Stroke Risk Factors  Obesity, Body mass index is 31.09 kg/(m^2).   Hx stroke/TIA  Family hx stroke (mother)  Other Active Problems  Altered mental status  UTI on abx  History of diastolic CHF  Urinary retention  Hx RA  Hospital day # 1  Rhoderick Moody [facility] See Amion for Pager information 12/29/2014 9:19 AM  I have personally examined this patient, reviewed notes, independently viewed imaging studies, participated in medical decision making and plan of care. I have made any additions or clarifications directly to the above note. Agree with note above. Elderly lady with recent watershed infarct and left hemiplegia presents with brief episode of unresponsiveness in the setting of hypotension likely syncopal event secondary to dehydration and urinary tract infection. I do not believe further stroke evaluation is necessary. Continue ongoing treatment for medical issues as per primary team. Long discussion with the daughter at the bedside regarding neurological issues and answered questions Stroke team will sign off kindly call for questions  Delia Heady, MD Medical Director [facility] Pager: [phone] 12/29/2014 2:57 PM    To contact Stroke Continuity provider, please refer to [email or web address]. After hours, contact General Neurology

## 2014-12-29 NOTE — Discharge Summary (Addendum)
Physician Discharge Summary  Monica Blankenship:[id number] DOB: 1929-11-27 DOA: 12/28/2014  PCP: Georgann Housekeeper, MD  Admit date: 12/28/2014 Discharge date: 12/29/2014  Time spent: 45 minutes  Recommendations for Outpatient Follow-up:  Patient will return to [facility].  She is to continue physical as well as occupational therapy is recommended by the facility. Patient to continue her medications as prescribed. Patient should also follow-up with Dr. Pearlean Brownie for scheduled appointment.  She should also follow up with her primary care physician within 1-2 weeks of discharge.  Patient should follow a heart healthy diet.  Patient will also need to follow up with urology with 1-2 weeks of discharge.  Discharge Diagnoses:  Active Problems:   Chronic diastolic CHF (congestive heart failure)   HTN (hypertension)   Syncope   Stroke   Urinary retention  Discharge Condition: Stable  Diet recommendation: Heart healthy  Filed Weights   12/28/14 1609  Weight: 77.111 kg (170 lb)    History of present illness:  By Dr. Huey Bienenstock on 12/28/2014 Monica Blankenship is a 81 y.o. 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 Patient was brought by EMS secondary to syncope, patient lives in a skilled nursing facility, was noticed to be diaphoretic, and had episode of syncope, then she was confused after that, systolic blood pressure was in the 70s, EMS was called when the daughter arrived and noticed her baseline weakness appears to be worse, code stroke was called on arrival, CT head was done which did not show any evidence of acute stroke, patient was seen by neurology in ED, who did not think her symptoms are related to stroke, and it was most likely due to syncope,  with recommendation for further workup of her syncope, patient was noticed to have urinary retention in ED of 700 mL, urinalysis was positive, as it appears to be clinically dehydrated, as well she had elevation of her creatinine from 0.73 to 1.26, chest x-ray did not show any acute findings.  Hospital Course:  Syncope -Likely vasovagal versus volume depletion and dehydration -Patient's blood pressure was noted to be 70 systolic before arrival to the emergency department. This has resolved with IV fluid hydration. -Patient was found to have a urinary tract infection which will be treated with antibiotics  Recent CVA -Neurology consultation appreciated -CT scan: Unchanged compared to study on 12/14/2014 -Continue aspirin and Plavix -Patient will need follow-up with Dr. Pearlean Brownie for scheduled appt -Continue PT/OT per SNF  Urinary tract infection -Was given ceftriaxone, will give cipro, last urine culture (earlier this month, showed no growth)  Chronic diastolic CHF -Currently euvolemic  Hypertension -Resume verapamil  Urinary Retention -Voiding trial attempted, patient retained >570ml -Foley reinserted.  -Spoke with Dr. Retta Diones, urology, who recommend flomax with outpatient followup  Rheumatoid Arthritis -Continue prednisone -Patient was given stress dose of   Procedures: None  Consultations: Neurology  Discharge Exam: Filed Vitals:   12/29/14 1353  BP: 146/44  Pulse: 68  Temp: 98.6 F (37 C)  Resp: 18     General: Well developed, well nourished, NAD, appears stated age  HEENT: NCAT,mucous membranes moist.  Cardiovascular: S1 S2 auscultated, no rubs, murmurs or gallops. Regular rate and rhythm.  Respiratory: Clear to auscultation bilaterally with equal chest rise  Abdomen: Soft, nontender, nondistended, + bowel sounds  Extremities: warm dry without  cyanosis clubbing or edema  Neuro: AAOx3, nonfocal, residual left hand weakness  Psych: Normal affect and  demeanor with intact judgement and insight  Discharge Instructions      Discharge Instructions    Discharge instructions    Complete by:  As directed   Patient will return to [facility].  She is to continue physical as well as occupational therapy is recommended by the facility. Patient to continue her medications as prescribed. Patient should also follow-up with Dr. Pearlean Brownie for scheduled appointment.  She should also follow up with her primary care physician within 1-2 weeks of discharge.  Patient should follow a heart healthy diet. Patient will also need to follow up with urology with 1-2 weeks of discharge.            Medication List    STOP taking these medications        doxazosin 8 MG tablet  Commonly known as:  CARDURA      TAKE these medications        acetaminophen 500 MG tablet  Commonly known as:  TYLENOL  Take 1,000 mg by mouth every 6 (six) hours as needed for mild pain or moderate pain.     albuterol 108 (90 BASE) MCG/ACT inhaler  Commonly known as:  PROVENTIL HFA;VENTOLIN HFA  Inhale 2 puffs into the lungs every 6 (six) hours as needed for shortness of breath.     ALPRAZolam 0.25 MG tablet  Commonly known as:  XANAX  Take 1 tablet (0.25 mg total) by mouth 2 (two) times daily as needed for anxiety.     aspirin 81 MG chewable tablet  Chew 4 tablets (324 mg total) by mouth daily.     atorvastatin 80 MG tablet  Commonly known as:  LIPITOR  Take 1 tablet (80 mg total) by mouth daily at 6 PM.     bisacodyl 5 MG EC tablet  Commonly known as:  DULCOLAX  Take 5 mg by mouth daily as needed for severe constipation.     cholecalciferol 1000 UNITS tablet  Commonly known as:  VITAMIN D  Take 1,000 Units by mouth daily.     ciprofloxacin 250 MG tablet  Commonly known as:  CIPRO  Take 1 tablet (250 mg total) by mouth 2 (two) times daily.     clopidogrel 75 MG tablet  Commonly known as:  PLAVIX  Take 1 tablet (75 mg total) by mouth daily.     diclofenac sodium  1 % Gel  Commonly known as:  VOLTAREN  Apply 2 g topically 3 (three) times daily as needed (pain).     ferrous sulfate 325 (65 FE) MG tablet  Take 325 mg by mouth daily with breakfast.     folic acid 1 MG tablet  Commonly known as:  FOLVITE  Take 1 mg by mouth daily.     hydrALAZINE 25 MG tablet  Commonly known as:  APRESOLINE  Take 75 mg by mouth every 8 (eight) hours. Hold for SBP <110     polyethylene glycol packet  Commonly known as:  MIRALAX / GLYCOLAX  Take 17 g by mouth at bedtime.     potassium chloride SA 20 MEQ tablet  Commonly known as:  K-DUR,KLOR-CON  Take 20 mEq by mouth every morning.     predniSONE 5 MG tablet  Commonly known as:  DELTASONE  Take 5 mg by mouth daily with breakfast.     REFRESH 1 % ophthalmic solution  Generic drug:  carboxymethylcellulose  Place 1 drop into both eyes at bedtime.     SYSTANE 0.4-0.3 % Soln  Generic drug:  Polyethyl Glycol-Propyl Glycol  Place 1 drop into both eyes 3 (three) times daily.     tamsulosin 0.4 MG Caps capsule  Commonly known as:  FLOMAX  Take 1 capsule (0.4 mg total) by mouth daily.     traMADol 50 MG tablet  Commonly known as:  ULTRAM  Take 1 tablet (50 mg total) by mouth every 6 (six) hours as needed for moderate pain.     verapamil 120 MG CR tablet  Commonly known as:  CALAN-SR  Take 1 tablet (120 mg total) by mouth daily.       Allergies  Allergen Reactions  . Fentanyl Other (See Comments)    confusion   Follow-up Information    Follow up with Georgann Housekeeper, MD. Schedule an appointment as soon as possible for a visit in 1 week.   Specialty:  Internal Medicine   Why:  Hospital followup   Contact information:   [address], [address] [place] [address] [phone]       Follow up with Chelsea Aus, MD. Schedule an appointment as soon as possible for a visit in 1 week.   Specialty:  Urology   Why:  Hospital followup, urinary retention   Contact information:   [address] [place] [address] [phone]        The results of significant diagnostics from this hospitalization (including imaging, microbiology, ancillary and laboratory) are listed below for reference.    Significant Diagnostic Studies: Ct Head Wo Contrast  12/28/2014   CLINICAL DATA:  A 43-year-old female with left-sided facial droop and left-sided weakness. Slurred speech. Possible stroke. History of cerebral vascular accident in December 2015.  EXAM: CT HEAD WITHOUT CONTRAST  TECHNIQUE: Contiguous axial images were obtained from the base of the skull through the vertex without intravenous contrast.  COMPARISON:  Brain MRI 12/15/2014.  Head CT 12/14/2014.  FINDINGS: Patchy and confluent areas of decreased attenuation are noted throughout the deep and periventricular white matter of the cerebral hemispheres bilaterally, compatible with chronic microvascular ischemic disease. More confluent low attenuation throughout the watershed distribution in the right cerebral hemisphere, similar to prior examination from 12/14/2014, compatible with prior watershed territory infarction. Subtle areas of acute ischemia in this region would be impossible to discern on today's CT examination. No signs of acute intracranial hemorrhage, and no large territory ischemic changes identified on today's examination. Old infarct in the right cerebellar hemisphere is unchanged. No mass, mass effect, hydrocephalus or abnormal intra or extra-axial fluid collections. Visualized paranasal sinuses and mastoids are well pneumatized. No acute displaced skull fractures are identified.  IMPRESSION: 1. Today's head CT is essentially unchanged in appearance compared to the prior study from 12/14/2014 again demonstrating extensive chronic microvascular ischemic changes in the cerebral white matter bilaterally, particularly in the watershed distribution in the right cerebral hemisphere, as above. These results were called by telephone at  the time of interpretation on 12/28/2014 at 1:21 pm to Dr. Amada Jupiter, who verbally acknowledged these results.   Electronically Signed   By: Trudie Reed M.D.   On: 12/28/2014 13:22   Ct Head Wo Contrast  12/14/2014   CLINICAL DATA:  Post stroke with LEFT-sided deficits, patient has been refusing medications for 1 week, now with decrease mental status an episodes of being combative, history hypertension, diastolic dysfunction, former smoker  EXAM: CT HEAD WITHOUT CONTRAST  TECHNIQUE:  Contiguous axial images were obtained from the base of the skull through the vertex without intravenous contrast.  COMPARISON:  11/15/2014  FINDINGS: Mild generalized atrophy.  Normal ventricular morphology.  No midline shift or mass effect.  Small vessel chronic ischemic changes of deep cerebral white matter.  Old RIGHT frontal infarct.  No intracranial hemorrhage, mass lesion, or evidence acute infarction.  No extra-axial fluid collections.  Atherosclerotic calcifications of the carotid siphons.  Hyperostosis frontalis interna.  No acute bone or sinus abnormalities.  IMPRESSION: Atrophy with small vessel chronic ischemic changes of deep cerebral white matter.  Old RIGHT frontal infarct.  No acute intracranial abnormalities.   Electronically Signed   By: Ulyses Southward M.D.   On: 12/14/2014 21:13   Ct Angio Chest Pe W/cm &/or Wo Cm  12/15/2014   CLINICAL DATA:  Acute onset of altered mental status and tachycardia. Elevated D-dimer. Initial encounter.  EXAM: CT ANGIOGRAPHY CHEST WITH CONTRAST  TECHNIQUE: Multidetector CT imaging of the chest was performed using the standard protocol during bolus administration of intravenous contrast. Multiplanar CT image reconstructions and MIPs were obtained to evaluate the vascular anatomy.  CONTRAST:  100 mL of Omnipaque 350 IV contrast  COMPARISON:  Chest radiograph performed 12/14/2014  FINDINGS: There is no evidence of pulmonary embolus.  Mild interstitial prominence may be chronic in  nature. A few blebs are noted in the right lung base. The lungs are otherwise clear. There is no evidence of significant focal consolidation, pleural effusion or pneumothorax. No masses are identified; no abnormal focal contrast enhancement is seen.  Diffuse coronary artery calcifications are noted. The mediastinum is otherwise unremarkable. A tiny hiatal hernia is noted. Scattered calcification is seen along the aortic arch and proximal great vessels, with likely mild to moderate luminal narrowing at the origin of the left subclavian artery, and dense calcification at the origin of the right vertebral artery. No pericardial effusion is seen. No mediastinal lymphadenopathy is appreciated. No axillary lymphadenopathy is seen. The visualized portions of the thyroid gland are unremarkable in appearance.  The visualized portions of the liver and spleen are unremarkable. Nonspecific bilateral perinephric stranding is noted. The visualized portions of the pancreas, gallbladder, stomach and adrenal glands are within normal limits. Reflux of contrast into the hepatic veins and IVC may reflect some degree of right heart insufficiency.  No acute osseous abnormalities are seen.  Review of the MIP images confirms the above findings.  IMPRESSION: 1. No evidence of pulmonary embolus. 2. Mild interstitial prominence may be chronic in nature. Few blebs at the right lung base. Lungs otherwise clear. 3. Diffuse coronary artery calcifications noted. 4. Tiny hiatal hernia seen. 5. Likely mild to moderate luminal narrowing at the origin of the left subclavian artery, and dense calcification at the origin of the right vertebral artery. 6. Reflux of contrast into the hepatic veins and IVC may reflect some degree of right heart insufficiency.   Electronically Signed   By: Roanna Raider M.D.   On: 12/15/2014 02:24   Mr Laqueta Jean ZO Contrast  12/15/2014   CLINICAL DATA:  Hypertension. Recent stroke. Bacterial endocarditis. Fever and  worsened mental status.  EXAM: MRI HEAD WITHOUT AND WITH CONTRAST  TECHNIQUE: Multiplanar, multiecho pulse sequences of the brain and surrounding structures were obtained without and with intravenous contrast.  CONTRAST:  15mL MULTIHANCE GADOBENATE DIMEGLUMINE 529 MG/ML IV SOLN  COMPARISON:  Head CT 12/14/2014.  MRI 11/15/2014.  FINDINGS: There has been progression of acute watershed infarctions within the right  hemisphere primarily affecting the deep white matter. This is most notable in the frontal parietal deep white matter. No acute infarction affecting the brainstem, cerebellum or left cerebral hemisphere. No hemorrhage or change in midline position.  Background pattern of extensive chronic small vessel disease remains evident affecting both cerebral hemispheres. Old bilateral frontal cortical and subcortical infarction appears the same.  No evidence of neoplastic mass lesion, hydrocephalus or extra-axial collection. After contrast administration, no abnormal enhancement occurs.  IMPRESSION: Worsening/acute extension of watershed infarctions in the deep white matter of the right cerebral hemisphere when compared to the previous study.   Electronically Signed   By: Paulina Fusi M.D.   On: 12/15/2014 18:32   Dg Chest Port 1 View  12/28/2014   CLINICAL DATA:  Altered mental status.  EXAM: PORTABLE CHEST - 1 VIEW  COMPARISON:  December 14, 2014.  FINDINGS: Stable cardiomediastinal silhouette. No pneumothorax or pleural effusion is noted. No acute pulmonary disease is noted. Bony thorax is intact.  IMPRESSION: No acute cardiopulmonary abnormality seen.   Electronically Signed   By: Roque Lias M.D.   On: 12/28/2014 13:11   Dg Chest Port 1 View  12/14/2014   CLINICAL DATA:  Urinary tract infection, decreasing mental status for 1 week  EXAM: PORTABLE CHEST - 1 VIEW  COMPARISON:  11/16/2014  FINDINGS: Borderline cardiomegaly. No acute infiltrate or pulmonary edema. Atherosclerotic calcifications of thoracic aorta  mild degenerative changes bilateral shoulders.  IMPRESSION: Borderline cardiomegaly.  No active disease.   Electronically Signed   By: Natasha Mead M.D.   On: 12/14/2014 19:33    Microbiology: Recent Results (from the past 240 hour(s))  MRSA PCR Screening     Status: None   Collection Time: 12/28/14  4:21 PM  Result Value Ref Range Status   MRSA by PCR NEGATIVE NEGATIVE Final    Comment:        The GeneXpert MRSA Assay (FDA approved for NASAL specimens only), is one component of a comprehensive MRSA colonization surveillance program. It is not intended to diagnose MRSA infection nor to guide or monitor treatment for MRSA infections.      Labs: Basic Metabolic Panel:  Recent Labs Lab 12/28/14 1253 12/28/14 1307 12/28/14 1653 12/29/14 0530  NA 141 143  --  141  K 4.0 3.9  --  3.7  CL 105 104  --  110  CO2 22  --   --  21  GLUCOSE 154* 156*  --  90  BUN 14 16  --  10  CREATININE 1.26* 1.10 0.95 0.84  CALCIUM 10.4  --   --  9.3   Liver Function Tests:  Recent Labs Lab 12/28/14 1253  AST 40*  ALT 29  ALKPHOS 77  BILITOT 1.2  PROT 8.2  ALBUMIN 4.0   No results for input(s): LIPASE, AMYLASE in the last 168 hours. No results for input(s): AMMONIA in the last 168 hours. CBC:  Recent Labs Lab 12/28/14 1253 12/28/14 1307 12/28/14 1653 12/29/14 0530  WBC 12.0*  --  10.1 7.9  NEUTROABS 10.2*  --   --   --   HGB 14.4 16.3* 13.6 12.3  HCT 42.8 48.0* 40.3 37.4  MCV 97.1  --  96.9 96.9  PLT 217  --  238 228   Cardiac Enzymes:  Recent Labs Lab 12/28/14 1253  TROPONINI 0.03   BNP: BNP (last 3 results)  Recent Labs  04/08/14 0807 04/09/14 0909  PROBNP 5143.0* 16685.0*   CBG:  Recent  Labs Lab 12/28/14 1250  GLUCAP 145*       Signed:  Edsel Petrin  [facility] 12/29/2014, 4:06 PM

## 2014-12-29 NOTE — Progress Notes (Signed)
Placed 21f foley catheter per order. Pt tolerated well. Pt to followup with urology outpatient for retention. Urine received. Date place on drainage bag.

## 2014-12-29 NOTE — Progress Notes (Signed)
Report called to [facility], spoke to L. Roxan Hockey LPN. Facility aware that PTAR is behind schedule. RN called pt's daughter to make her aware of situation.

## 2014-12-29 NOTE — Clinical Social Work Psychosocial (Signed)
Clinical Social Work Department BRIEF PSYCHOSOCIAL ASSESSMENT 12/29/2014  Patient:  Monica Blankenship, Monica Blankenship     Account Number:  [id number]     Admit date:  12/28/2014  Clinical Social Worker:  Merlyn Lot, CLINICAL SOCIAL WORKER  Date/Time:  12/29/2014 10:32 AM  Referred by:  Physician  Date Referred:  12/29/2014 Referred for  SNF Placement   Other Referral:   Interview type:  Family Other interview type:    PSYCHOSOCIAL DATA Living Status:  FACILITY Admitted from facility:  [facility] Level of care:  Skilled Nursing Facility Primary support name:  [name] Primary support relationship to patient:  CHILD, ADULT Degree of support available:   Patient has high level of support from daughter, [name], who states that she is the [facility] and that the patients spouse has dementia and is unable to make decision for the pt    CURRENT CONCERNS Current Concerns  Post-Acute Placement   Other Concerns:    SOCIAL WORK ASSESSMENT / PLAN CSW spoke with pt dtr concerning return to [facility] SNF.  Pt dtr is agreeable to return when patient is ready for DC and would like pt to be transported by [facility] when ready.   Assessment/plan status:  Psychosocial Support/Ongoing Assessment of Needs Other assessment/ plan:   none   Information/referral to community resources:    PATIENT'S/FAMILY'S RESPONSE TO PLAN OF CARE: Pt daughter is agreeable to plan for return to [facility] rehab center and is glad that pt prognosis is good enough that she will be able to DC soon.       Merlyn Lot, LCSWA Clinical Social Worker [phone]

## 2014-12-29 NOTE — Clinical Social Work Note (Signed)
Pt is from [facility]- facility can readmit patient today if ready for DC- family is agreeable to return.  [name] (pt daughter) is HCPOA.  CSW will continue to follow.  Merlyn Lot, LCSWA Clinical Social Worker [phone]

## 2014-12-30 DIAGNOSIS — R410 Disorientation, unspecified: Secondary | ICD-10-CM | POA: Insufficient documentation

## 2014-12-30 LAB — URINE CULTURE
COLONY COUNT: NO GROWTH
Culture: NO GROWTH

## 2015-01-04 LAB — CULTURE, BLOOD (ROUTINE X 2)
Culture: NO GROWTH
Culture: NO GROWTH

## 2015-01-13 ENCOUNTER — Ambulatory Visit: Payer: Medicare PPO | Admitting: Cardiology

## 2015-02-26 ENCOUNTER — Emergency Department (HOSPITAL_COMMUNITY): Payer: Medicare PPO

## 2015-02-26 ENCOUNTER — Inpatient Hospital Stay (HOSPITAL_COMMUNITY): Payer: Medicare PPO

## 2015-02-26 ENCOUNTER — Encounter (HOSPITAL_COMMUNITY): Payer: Self-pay

## 2015-02-26 ENCOUNTER — Inpatient Hospital Stay (HOSPITAL_COMMUNITY)
Admission: EM | Admit: 2015-02-26 | Discharge: 2015-03-03 | DRG: 698 | Disposition: A | Discharge status: Skilled Nursing Facility | Payer: Medicare PPO | Attending: Internal Medicine | Admitting: Internal Medicine

## 2015-02-26 DIAGNOSIS — E43 Unspecified severe protein-calorie malnutrition: Secondary | ICD-10-CM

## 2015-02-26 DIAGNOSIS — N182 Chronic kidney disease, stage 2 (mild): Secondary | ICD-10-CM | POA: Diagnosis present

## 2015-02-26 DIAGNOSIS — F0150 Vascular dementia without behavioral disturbance: Secondary | ICD-10-CM

## 2015-02-26 DIAGNOSIS — R451 Restlessness and agitation: Secondary | ICD-10-CM

## 2015-02-26 DIAGNOSIS — I5032 Chronic diastolic (congestive) heart failure: Secondary | ICD-10-CM | POA: Diagnosis present

## 2015-02-26 DIAGNOSIS — Z7982 Long term (current) use of aspirin: Secondary | ICD-10-CM

## 2015-02-26 DIAGNOSIS — R296 Repeated falls: Secondary | ICD-10-CM | POA: Diagnosis present

## 2015-02-26 DIAGNOSIS — Z9842 Cataract extraction status, left eye: Secondary | ICD-10-CM

## 2015-02-26 DIAGNOSIS — I10 Essential (primary) hypertension: Secondary | ICD-10-CM | POA: Diagnosis not present

## 2015-02-26 DIAGNOSIS — R0789 Other chest pain: Secondary | ICD-10-CM | POA: Insufficient documentation

## 2015-02-26 DIAGNOSIS — I050 Rheumatic mitral stenosis: Secondary | ICD-10-CM | POA: Diagnosis present

## 2015-02-26 DIAGNOSIS — F419 Anxiety disorder, unspecified: Secondary | ICD-10-CM | POA: Diagnosis present

## 2015-02-26 DIAGNOSIS — M7989 Other specified soft tissue disorders: Secondary | ICD-10-CM | POA: Diagnosis present

## 2015-02-26 DIAGNOSIS — Z515 Encounter for palliative care: Secondary | ICD-10-CM

## 2015-02-26 DIAGNOSIS — I209 Angina pectoris, unspecified: Secondary | ICD-10-CM | POA: Diagnosis present

## 2015-02-26 DIAGNOSIS — Z888 Allergy status to other drugs, medicaments and biological substances status: Secondary | ICD-10-CM

## 2015-02-26 DIAGNOSIS — Z960 Presence of urogenital implants: Secondary | ICD-10-CM

## 2015-02-26 DIAGNOSIS — R079 Chest pain, unspecified: Secondary | ICD-10-CM | POA: Diagnosis not present

## 2015-02-26 DIAGNOSIS — I080 Rheumatic disorders of both mitral and aortic valves: Secondary | ICD-10-CM | POA: Diagnosis present

## 2015-02-26 DIAGNOSIS — N390 Urinary tract infection, site not specified: Secondary | ICD-10-CM | POA: Diagnosis present

## 2015-02-26 DIAGNOSIS — Z87891 Personal history of nicotine dependence: Secondary | ICD-10-CM

## 2015-02-26 DIAGNOSIS — Z7189 Other specified counseling: Secondary | ICD-10-CM

## 2015-02-26 DIAGNOSIS — W19XXXA Unspecified fall, initial encounter: Secondary | ICD-10-CM | POA: Diagnosis present

## 2015-02-26 DIAGNOSIS — I69354 Hemiplegia and hemiparesis following cerebral infarction affecting left non-dominant side: Secondary | ICD-10-CM

## 2015-02-26 DIAGNOSIS — R630 Anorexia: Secondary | ICD-10-CM

## 2015-02-26 DIAGNOSIS — R531 Weakness: Secondary | ICD-10-CM

## 2015-02-26 DIAGNOSIS — M810 Age-related osteoporosis without current pathological fracture: Secondary | ICD-10-CM | POA: Diagnosis present

## 2015-02-26 DIAGNOSIS — Z90710 Acquired absence of both cervix and uterus: Secondary | ICD-10-CM

## 2015-02-26 DIAGNOSIS — R627 Adult failure to thrive: Secondary | ICD-10-CM | POA: Diagnosis present

## 2015-02-26 DIAGNOSIS — Z66 Do not resuscitate: Secondary | ICD-10-CM | POA: Diagnosis present

## 2015-02-26 DIAGNOSIS — F0151 Vascular dementia with behavioral disturbance: Secondary | ICD-10-CM | POA: Diagnosis present

## 2015-02-26 DIAGNOSIS — Z9841 Cataract extraction status, right eye: Secondary | ICD-10-CM

## 2015-02-26 DIAGNOSIS — T83511A Infection and inflammatory reaction due to indwelling urethral catheter, initial encounter: Secondary | ICD-10-CM

## 2015-02-26 DIAGNOSIS — I672 Cerebral atherosclerosis: Secondary | ICD-10-CM | POA: Diagnosis present

## 2015-02-26 DIAGNOSIS — Y846 Urinary catheterization as the cause of abnormal reaction of the patient, or of later complication, without mention of misadventure at the time of the procedure: Secondary | ICD-10-CM | POA: Diagnosis present

## 2015-02-26 DIAGNOSIS — B9620 Unspecified Escherichia coli [E. coli] as the cause of diseases classified elsewhere: Secondary | ICD-10-CM | POA: Diagnosis present

## 2015-02-26 DIAGNOSIS — Z6822 Body mass index (BMI) 22.0-22.9, adult: Secondary | ICD-10-CM

## 2015-02-26 DIAGNOSIS — I129 Hypertensive chronic kidney disease with stage 1 through stage 4 chronic kidney disease, or unspecified chronic kidney disease: Secondary | ICD-10-CM | POA: Diagnosis present

## 2015-02-26 DIAGNOSIS — T8351XA Infection and inflammatory reaction due to indwelling urinary catheter, initial encounter: Principal | ICD-10-CM | POA: Diagnosis present

## 2015-02-26 DIAGNOSIS — Z978 Presence of other specified devices: Secondary | ICD-10-CM

## 2015-02-26 HISTORY — DX: Vascular dementia, unspecified severity, without behavioral disturbance, psychotic disturbance, mood disturbance, and anxiety: F01.50

## 2015-02-26 LAB — TROPONIN I
TROPONIN I: 0.03 ng/mL (ref <0.031)
Troponin I: 0.03 ng/mL (ref <0.031)

## 2015-02-26 LAB — URINALYSIS, ROUTINE W REFLEX MICROSCOPIC
Bilirubin Urine: NEGATIVE
GLUCOSE, UA: NEGATIVE mg/dL
KETONES UR: 15 mg/dL — AB
Nitrite: POSITIVE — AB
PROTEIN: NEGATIVE mg/dL
SPECIFIC GRAVITY, URINE: 1.011 (ref 1.005–1.030)
Urobilinogen, UA: 1 mg/dL (ref 0.0–1.0)
pH: 7 (ref 5.0–8.0)

## 2015-02-26 LAB — CBC
HCT: 35.3 % — ABNORMAL LOW (ref 36.0–46.0)
HEMOGLOBIN: 12.2 g/dL (ref 12.0–15.0)
MCH: 33.1 pg (ref 26.0–34.0)
MCHC: 34.6 g/dL (ref 30.0–36.0)
MCV: 95.7 fL (ref 78.0–100.0)
PLATELETS: 225 10*3/uL (ref 150–400)
RBC: 3.69 MIL/uL — ABNORMAL LOW (ref 3.87–5.11)
RDW: 15.8 % — AB (ref 11.5–15.5)
WBC: 13 10*3/uL — AB (ref 4.0–10.5)

## 2015-02-26 LAB — BASIC METABOLIC PANEL
ANION GAP: 9 (ref 5–15)
BUN: 9 mg/dL (ref 6–23)
CALCIUM: 9.4 mg/dL (ref 8.4–10.5)
CO2: 22 mmol/L (ref 19–32)
CREATININE: 0.66 mg/dL (ref 0.50–1.10)
Chloride: 104 mmol/L (ref 96–112)
GFR calc Af Amer: >90 mL/min (ref >90)
GFR, EST NON AFRICAN AMERICAN: 79 mL/min — AB (ref >90)
Glucose, Bld: 99 mg/dL (ref 70–99)
Potassium: 4.4 mmol/L (ref 3.5–5.1)
Sodium: 135 mmol/L (ref 135–145)

## 2015-02-26 LAB — URINE MICROSCOPIC-ADD ON

## 2015-02-26 LAB — I-STAT TROPONIN, ED: Troponin i, poc: 0.02 ng/mL (ref 0.00–0.08)

## 2015-02-26 MED ORDER — SODIUM CHLORIDE 0.9 % IJ SOLN
3.0000 mL | Freq: Two times a day (BID) | INTRAMUSCULAR | Status: DC
  Administered 2015-02-26 – 2015-03-01 (×5): 3 mL via INTRAVENOUS

## 2015-02-26 MED ORDER — PANTOPRAZOLE SODIUM 40 MG PO TBEC
40.0000 mg | DELAYED_RELEASE_TABLET | Freq: Every day | ORAL | Status: DC
  Administered 2015-02-26 – 2015-03-01 (×2): 40 mg via ORAL
  Filled 2015-02-26 (×2): qty 1

## 2015-02-26 MED ORDER — TAMSULOSIN HCL 0.4 MG PO CAPS
0.4000 mg | ORAL_CAPSULE | Freq: Every day | ORAL | Status: DC
  Administered 2015-02-26 – 2015-03-01 (×4): 0.4 mg via ORAL
  Filled 2015-02-26 (×6): qty 1

## 2015-02-26 MED ORDER — ACETAMINOPHEN 325 MG PO TABS
650.0000 mg | ORAL_TABLET | ORAL | Status: DC | PRN
  Administered 2015-02-27: 650 mg via ORAL
  Filled 2015-02-26: qty 2

## 2015-02-26 MED ORDER — ASPIRIN EC 81 MG PO TBEC
81.0000 mg | DELAYED_RELEASE_TABLET | Freq: Every day | ORAL | Status: DC
  Administered 2015-03-01: 81 mg via ORAL
  Filled 2015-02-26 (×4): qty 1

## 2015-02-26 MED ORDER — FOLIC ACID 1 MG PO TABS
1.0000 mg | ORAL_TABLET | Freq: Every day | ORAL | Status: DC
  Administered 2015-03-01: 1 mg via ORAL
  Filled 2015-02-26 (×3): qty 1

## 2015-02-26 MED ORDER — ENOXAPARIN SODIUM 40 MG/0.4ML ~~LOC~~ SOLN
40.0000 mg | SUBCUTANEOUS | Status: DC
  Administered 2015-02-26 – 2015-02-28 (×3): 40 mg via SUBCUTANEOUS
  Filled 2015-02-26 (×5): qty 0.4

## 2015-02-26 MED ORDER — LORAZEPAM 0.5 MG PO TABS
0.5000 mg | ORAL_TABLET | Freq: Four times a day (QID) | ORAL | Status: DC | PRN
  Administered 2015-02-27 – 2015-02-28 (×2): 0.5 mg via ORAL
  Filled 2015-02-26 (×3): qty 1

## 2015-02-26 MED ORDER — METHYLPHENIDATE HCL 5 MG PO TABS
2.5000 mg | ORAL_TABLET | Freq: Every day | ORAL | Status: DC
  Administered 2015-03-01 – 2015-03-03 (×2): 2.5 mg via ORAL
  Filled 2015-02-26 (×3): qty 1

## 2015-02-26 MED ORDER — DEXTROSE 5 % IV SOLN
1.0000 g | Freq: Once | INTRAVENOUS | Status: AC
  Administered 2015-02-26: 1 g via INTRAVENOUS
  Filled 2015-02-26: qty 10

## 2015-02-26 MED ORDER — HYDRALAZINE HCL 20 MG/ML IJ SOLN
10.0000 mg | Freq: Four times a day (QID) | INTRAMUSCULAR | Status: DC | PRN
  Administered 2015-02-26: 10 mg via INTRAVENOUS
  Filled 2015-02-26: qty 1

## 2015-02-26 MED ORDER — HYPROMELLOSE (GONIOSCOPIC) 2.5 % OP SOLN
1.0000 [drp] | Freq: Every day | OPHTHALMIC | Status: DC
  Administered 2015-02-26 – 2015-03-02 (×5): 1 [drp] via OPHTHALMIC
  Filled 2015-02-26: qty 15

## 2015-02-26 MED ORDER — CITALOPRAM HYDROBROMIDE 20 MG PO TABS
20.0000 mg | ORAL_TABLET | Freq: Every day | ORAL | Status: DC
  Administered 2015-03-01 – 2015-03-03 (×2): 20 mg via ORAL
  Filled 2015-02-26 (×5): qty 1

## 2015-02-26 MED ORDER — HYDROCODONE-ACETAMINOPHEN 5-325 MG PO TABS: 1.0000 | ORAL_TABLET | ORAL | Status: DC | PRN

## 2015-02-26 MED ORDER — MELATONIN 5 MG PO CAPS: 5.0000 mg | ORAL_CAPSULE | Freq: Every day | ORAL | Status: DC

## 2015-02-26 MED ORDER — CEFTRIAXONE SODIUM IN DEXTROSE 20 MG/ML IV SOLN
1.0000 g | INTRAVENOUS | Status: DC
  Administered 2015-02-27 – 2015-02-28 (×2): 1 g via INTRAVENOUS
  Filled 2015-02-26 (×3): qty 50

## 2015-02-26 MED ORDER — CARBOXYMETHYLCELLULOSE SODIUM 1 % OP SOLN: 1.0000 [drp] | Freq: Every day | OPHTHALMIC | Status: DC

## 2015-02-26 MED ORDER — MELATONIN 3 MG PO TABS
3.0000 mg | ORAL_TABLET | Freq: Every day | ORAL | Status: DC
  Administered 2015-02-26 – 2015-02-28 (×3): 3 mg via ORAL
  Filled 2015-02-26 (×4): qty 1

## 2015-02-26 MED ORDER — POLYETHYLENE GLYCOL 3350 17 G PO PACK
17.0000 g | PACK | Freq: Every day | ORAL | Status: DC
Start: 2015-02-26 — End: 2015-03-03
  Administered 2015-02-27 – 2015-03-02 (×3): 17 g via ORAL
  Filled 2015-02-26 (×6): qty 1

## 2015-02-26 MED ORDER — SODIUM CHLORIDE 0.9 % IV SOLN
INTRAVENOUS | Status: DC
  Administered 2015-02-26: 23:00:00 via INTRAVENOUS

## 2015-02-26 MED ORDER — GI COCKTAIL ~~LOC~~
30.0000 mL | Freq: Four times a day (QID) | ORAL | Status: DC | PRN
  Filled 2015-02-26: qty 30

## 2015-02-26 MED ORDER — DICLOFENAC SODIUM 1 % TD GEL: 2.0000 g | Freq: Three times a day (TID) | TRANSDERMAL | Status: DC | PRN

## 2015-02-26 MED ORDER — CLOPIDOGREL BISULFATE 75 MG PO TABS
75.0000 mg | ORAL_TABLET | Freq: Every day | ORAL | Status: DC
  Administered 2015-03-01: 75 mg via ORAL
  Filled 2015-02-26 (×3): qty 1

## 2015-02-26 MED ORDER — ONDANSETRON HCL 4 MG/2ML IJ SOLN: 4.0000 mg | Freq: Four times a day (QID) | INTRAMUSCULAR | Status: DC | PRN

## 2015-02-26 NOTE — Progress Notes (Signed)
Patient came to the floor at 1735, alert and oriented, denies pain, shortness of breath.SR on the monitor 60-70. Will continue monitor patient.

## 2015-02-26 NOTE — ED Provider Notes (Signed)
CSN: [id number]     Arrival date & time 02/26/15  1145 History   First MD Initiated Contact with Patient 02/26/15 1221     Chief Complaint  Patient presents with  . Chest Pain     (Consider location/radiation/quality/duration/timing/severity/associated sxs/prior Treatment) Patient is a 81 y.o. female presenting with chest pain.  Chest Pain Pain location:  L chest Pain quality: sharp   Pain radiates to:  Does not radiate Pain severity:  Moderate Onset quality:  Sudden Duration: a few hours. Timing:  Constant Progression:  Resolved Chronicity:  New Context comment:  Pt began complaining of pain to her nurse at her nursing facility.  Relieved by:  Nothing Worsened by:  Nothing tried Associated symptoms: no abdominal pain, no cough, no diaphoresis, no fever, no nausea, no shortness of breath and not vomiting     Past Medical History  Diagnosis Date  . Allergy   . Anxiety   . Insomnia   . Osteoarthritis   . Anemia   . Obesity   . Diastolic dysfunction   . CKD (chronic kidney disease)   . Mitral stenosis     with moderate MR on echo 4/14  . Aortic stenosis     mild by echo 4/14  . Aortic regurgitation     mild to moderate by echo 4/14  . HTN (hypertension)   . Stroke    Past Surgical History  Procedure Laterality Date  . Eye surgery    . Abdominal hysterectomy    . Appendectomy    . Cataract extraction, bilateral    . Tee without cardioversion N/A 04/13/2014    Procedure: TRANSESOPHAGEAL ECHOCARDIOGRAM (TEE);  Surgeon: Vesta Mixer, MD;  Location: [facility] ENDOSCOPY;  Service: Cardiovascular;  Laterality: N/A;   Family History  Problem Relation Age of Onset  . Hypertension Mother   . CVA Mother    History  Substance Use Topics  . Smoking status: Former Smoker -- 0.25 packs/day for 1 years  . Smokeless tobacco: Never Used     Comment: smoked 1-3 cigs/day in school for about a year some days only  . Alcohol Use: No   OB History    No data available      Review of Systems  Constitutional: Negative for fever and diaphoresis.  Respiratory: Negative for cough and shortness of breath.   Cardiovascular: Positive for chest pain.  Gastrointestinal: Negative for nausea, vomiting and abdominal pain.  All other systems reviewed and are negative.     Allergies  Fentanyl  Home Medications   Prior to Admission medications   Medication Sig Start Date End Date Taking? Authorizing Provider  acetaminophen (TYLENOL) 500 MG tablet Take 1,000 mg by mouth every 6 (six) hours as needed for mild pain or moderate pain.   Yes Historical Provider, MD  aspirin 81 MG chewable tablet Chew 4 tablets (324 mg total) by mouth daily. 11/17/14  Yes Rhetta Mura, MD  citalopram (CELEXA) 20 MG tablet Take 20 mg by mouth daily.   Yes Historical Provider, MD  clopidogrel (PLAVIX) 75 MG tablet Take 1 tablet (75 mg total) by mouth daily. 11/17/14  Yes Rhetta Mura, MD  diclofenac sodium (VOLTAREN) 1 % GEL Apply 2 g topically 3 (three) times daily as needed (pain).    Yes Historical Provider, MD  esomeprazole (NEXIUM) 20 MG capsule Take 20 mg by mouth daily at 12 noon.   Yes Historical Provider, MD  ferrous sulfate 325 (65 FE) MG tablet Take 325 mg by mouth  daily with breakfast.   Yes Historical Provider, MD  folic acid (FOLVITE) 1 MG tablet Take 1 mg by mouth daily.   Yes Historical Provider, MD  LORazepam (ATIVAN) 0.5 MG tablet Take 0.5 mg by mouth every 6 (six) hours as needed for anxiety.   Yes Historical Provider, MD  Melatonin 5 MG CAPS Take 5 mg by mouth at bedtime.   Yes Historical Provider, MD  methylphenidate (RITALIN) 5 MG tablet Take 2.5 mg by mouth daily.   Yes Historical Provider, MD  polyethylene glycol (MIRALAX / GLYCOLAX) packet Take 17 g by mouth at bedtime.   Yes Historical Provider, MD  tamsulosin (FLOMAX) 0.4 MG CAPS capsule Take 1 capsule (0.4 mg total) by mouth daily. Patient taking differently: Take 0.4 mg by mouth at bedtime.  12/29/14   Yes Maryann Mikhail, DO  albuterol (PROVENTIL HFA;VENTOLIN HFA) 108 (90 BASE) MCG/ACT inhaler Inhale 2 puffs into the lungs every 6 (six) hours as needed for shortness of breath.    Historical Provider, MD  ALPRAZolam Prudy Feeler) 0.25 MG tablet Take 1 tablet (0.25 mg total) by mouth 2 (two) times daily as needed for anxiety. 12/22/14   Leana Roe Elgergawy, MD  atorvastatin (LIPITOR) 80 MG tablet Take 1 tablet (80 mg total) by mouth daily at 6 PM. 11/17/14   Rhetta Mura, MD  bisacodyl (DULCOLAX) 5 MG EC tablet Take 5 mg by mouth daily as needed for severe constipation.     Historical Provider, MD  carboxymethylcellulose (REFRESH) 1 % ophthalmic solution Place 1 drop into both eyes at bedtime.     Historical Provider, MD  cholecalciferol (VITAMIN D) 1000 UNITS tablet Take 1,000 Units by mouth daily.    Historical Provider, MD  ciprofloxacin (CIPRO) 250 MG tablet Take 1 tablet (250 mg total) by mouth 2 (two) times daily. 12/29/14   Maryann Mikhail, DO  hydrALAZINE (APRESOLINE) 25 MG tablet Take 75 mg by mouth every 8 (eight) hours. Hold for SBP <110    Historical Provider, MD  Polyethyl Glycol-Propyl Glycol (SYSTANE) 0.4-0.3 % SOLN Place 1 drop into both eyes 3 (three) times daily.     Historical Provider, MD  potassium chloride SA (K-DUR,KLOR-CON) 20 MEQ tablet Take 20 mEq by mouth every morning.     Historical Provider, MD  predniSONE (DELTASONE) 5 MG tablet Take 5 mg by mouth daily with breakfast.    Historical Provider, MD  traMADol (ULTRAM) 50 MG tablet Take 1 tablet (50 mg total) by mouth every 6 (six) hours as needed for moderate pain. 12/22/14   Leana Roe Elgergawy, MD  verapamil (CALAN-SR) 120 MG CR tablet Take 1 tablet (120 mg total) by mouth daily. 05/24/14   Georgann Housekeeper, MD   BP 146/50 mmHg  Pulse 66  Temp(Src) 99.1 F (37.3 C) (Oral)  Resp 14  SpO2 100% Physical Exam  Constitutional: She is oriented to person, place, and time. She appears well-developed and well-nourished. No  distress.  HENT:  Head: Normocephalic and atraumatic.  Mouth/Throat: Oropharynx is clear and moist.  Eyes: Conjunctivae are normal. Pupils are equal, round, and reactive to light. No scleral icterus.  Neck: Neck supple.  Cardiovascular: Normal rate, regular rhythm, normal heart sounds and intact distal pulses.   No murmur heard. Pulmonary/Chest: Effort normal and breath sounds normal. No stridor. No respiratory distress. She has no rales. She exhibits no tenderness.  Abdominal: Soft. Bowel sounds are normal. She exhibits no distension. There is no tenderness.  Musculoskeletal: Normal range of motion.  Neurological: She is  alert and oriented to person, place, and time.  Left sided hemiplegia  Skin: Skin is warm and dry. No rash noted.  Psychiatric: She has a normal mood and affect. Her behavior is normal.  Nursing note and vitals reviewed.   ED Course  Procedures (including critical care time) Labs Review Labs Reviewed  CBC  BASIC METABOLIC PANEL  Rosezena Sensor, ED    Imaging Review Dg Chest Port 1 View  02/26/2015   CLINICAL DATA:  Left-sided chest pain  EXAM: PORTABLE CHEST - 1 VIEW  COMPARISON:  12/28/2014  FINDINGS: The heart size and mediastinal contours are within normal limits. Both lungs are clear. The visualized skeletal structures are unremarkable. Right AC joint degenerative change noted. Left glenohumeral joint degenerative change noted.  IMPRESSION: No acute cardiopulmonary process.   Electronically Signed   By: Christiana Pellant M.D.   On: 02/26/2015 12:45  All radiology studies independently viewed by me.      EKG Interpretation   Date/Time:  Saturday February 26 2015 11:49:37 EDT Ventricular Rate:  70 PR Interval:  178 QRS Duration: 94 QT Interval:  436 QTC Calculation: 470 R Axis:   -8 Text Interpretation:  Normal sinus rhythm Nonspecific ST and T wave  abnormality Prolonged QT Abnormal ECG nonspecific t wave abnormality new  compared to prior Confirmed by  [facility]  MD, TREY ([id number]) on 02/26/2015  12:23:11 PM      MDM   Final diagnoses:  Chest pain, unspecified chest pain type  UTI (lower urinary tract infection)    81 yo female with hx of stroke with left hemiplegia presenting with chest pain.  History difficult to obtain.  Per daughter pt "has her good days and her bad days", but her cognition is worse since stroke.  Pt currently denies chest pain.  She denied having any associated symptoms.  EKG shows some nonspecific t wave changes which appear to be new.  She received ASA 324 prior to arrival. Plan labs, CXR.    Troponin negative.  Due to age, risk profile, and difficult historian, and ekg changes, plan admit for cardiac rule out.  She also has evidence of UTI, treated with Rocephin.    Blake Divine, MD 02/27/15 [phone]

## 2015-02-26 NOTE — ED Notes (Signed)
Pt. Is from [facility]. Complaint of sudden onset L sided CP today. Pt. States that it is her "normal pain." Denies CP/SOB at this time. Pt. In NSR 78. Pt. Received 324 ASA. Pt. Is confused at baseline, Nursing facility states she is at her normal mental status.

## 2015-02-26 NOTE — ED Notes (Signed)
Urine drained from pt's Foley urine bag. Pt came to ED with foley in place.

## 2015-02-26 NOTE — ED Notes (Signed)
Pt placed in gown and in bed. Pt monitored by pulse ox, bp cuff, and 12-lead. 

## 2015-02-26 NOTE — H&P (Signed)
[facility] History and Physical                                                                                    Monica Blankenship, is a 81 y.o. female  MRN: [id number]   DOB - Mar 12, 1929  Admit Date - 02/26/2015  Outpatient Primary MD for the patient is Georgann Housekeeper, MD  With History of -  Past Medical History  Diagnosis Date  . Allergy   . Anxiety   . Insomnia   . Osteoarthritis   . Anemia   . Obesity   . Diastolic dysfunction   . CKD (chronic kidney disease)   . Mitral stenosis     with moderate MR on echo 4/14  . Aortic stenosis     mild by echo 4/14  . Aortic regurgitation     mild to moderate by echo 4/14  . HTN (hypertension)   . Stroke       Past Surgical History  Procedure Laterality Date  . Eye surgery    . Abdominal hysterectomy    . Appendectomy    . Cataract extraction, bilateral    . Tee without cardioversion N/A 04/13/2014    Procedure: TRANSESOPHAGEAL ECHOCARDIOGRAM (TEE);  Surgeon: Vesta Mixer, MD;  Location: [facility] ENDOSCOPY;  Service: Cardiovascular;  Laterality: N/A;    in for   Chief Complaint  Patient presents with  . Chest Pain     HPI  Monica Blankenship  is a 81 y.o. female, with a past medical history significant for CVA with left-sided hemiparesis, diastolic dysfunction, chronic kidney disease stage II, mitral and aortic stenosis, and hypertension. She presents to the emergency department from [facility] with chest pain. History was gathered from both the patient and her daughter as the patient has significant dementia and is a poor historian. The daughter tells me that the patient developed chest pain this morning between 9 and 9:30 it lasted about 25 minutes, and resolved on its own. The patient tells me that the pain was located in her sternal area that radiated in a band like fashion across her chest and felt like a heavy pressure. She reports not having pain like that previously. Further her daughter tells  me that the patient has fallen out of her wheelchair twice in the past week. Her left hand is swollen and bruised in the skilled nursing facility had a question of a torn rotator cuff in her left shoulder. There is supposed to be an orthopedic surgery appointment early next week.  In the ER the patient was found to have a urinary tract infection. Her point-of-care troponin is 0.02. EKG shows no acute changes of ST elevation or depression. This Ebers will be admitted for chest pain rule out and treatment of her urinary tract infection. We will also x-ray her left hand.   Review of Systems   In addition to the HPI above,  No Fever-chills, No Headache, No changes with Vision or hearing, No problems swallowing food or Liquids, Cough or Shortness of Breath, No Abdominal pain, No Nausea or Vomiting, Bowel movements are regular, No Blood in stool or Urine, No dysuria, No new  skin rashes or bruises, No recent weight gain or loss, A full 10 point Review of Systems was done, except as stated above, all other Review of Systems were negative.  Social History History  Substance Use Topics  . Smoking status: Former Smoker -- 0.25 packs/day for 1 years  . Smokeless tobacco: Never Used     Comment: smoked 1-3 cigs/day in school for about a year some days only  . Alcohol Use: No  At [facility].    Family History Family History  Problem Relation Age of Onset  . Hypertension Mother   . CVA Mother     Prior to Admission medications   Medication Sig Start Date End Date Taking? Authorizing Provider  albuterol (PROVENTIL HFA;VENTOLIN HFA) 108 (90 BASE) MCG/ACT inhaler Inhale 2 puffs into the lungs every 6 (six) hours as needed for shortness of breath.   Yes Historical Provider, MD  aspirin 81 MG chewable tablet Chew 4 tablets (324 mg total) by mouth daily. 11/17/14  Yes Rhetta Mura, MD  atorvastatin (LIPITOR) 80 MG tablet Take 1 tablet (80 mg total) by mouth daily at 6 PM. 11/17/14   Yes Rhetta Mura, MD  bisacodyl (DULCOLAX) 5 MG EC tablet Take 5 mg by mouth daily as needed for severe constipation.    Yes Historical Provider, MD  carboxymethylcellulose (REFRESH) 1 % ophthalmic solution Place 1 drop into both eyes at bedtime.    Yes Historical Provider, MD  cholecalciferol (VITAMIN D) 1000 UNITS tablet Take 1,000 Units by mouth daily.   Yes Historical Provider, MD  citalopram (CELEXA) 20 MG tablet Take 20 mg by mouth daily.   Yes Historical Provider, MD  clopidogrel (PLAVIX) 75 MG tablet Take 1 tablet (75 mg total) by mouth daily. 11/17/14  Yes Rhetta Mura, MD  diclofenac sodium (VOLTAREN) 1 % GEL Apply 2 g topically 3 (three) times daily as needed (pain).    Yes Historical Provider, MD  esomeprazole (NEXIUM) 20 MG capsule Take 20 mg by mouth daily at 12 noon.   Yes Historical Provider, MD  ferrous sulfate 325 (65 FE) MG tablet Take 325 mg by mouth daily with breakfast.   Yes Historical Provider, MD  folic acid (FOLVITE) 1 MG tablet Take 1 mg by mouth daily.   Yes Historical Provider, MD  LORazepam (ATIVAN) 0.5 MG tablet Take 0.5 mg by mouth every 6 (six) hours as needed for anxiety.   Yes Historical Provider, MD  Melatonin 5 MG CAPS Take 5 mg by mouth at bedtime.   Yes Historical Provider, MD  methylphenidate (RITALIN) 5 MG tablet Take 2.5 mg by mouth daily.   Yes Historical Provider, MD  Polyethyl Glycol-Propyl Glycol (SYSTANE) 0.4-0.3 % SOLN Place 1 drop into both eyes 3 (three) times daily.    Yes Historical Provider, MD  polyethylene glycol (MIRALAX / GLYCOLAX) packet Take 17 g by mouth at bedtime.   Yes Historical Provider, MD  tamsulosin (FLOMAX) 0.4 MG CAPS capsule Take 1 capsule (0.4 mg total) by mouth daily. Patient taking differently: Take 0.4 mg by mouth at bedtime.  12/29/14  Yes Maryann Mikhail, DO  verapamil (CALAN-SR) 120 MG CR tablet Take 1 tablet (120 mg total) by mouth daily. 05/24/14  Yes Georgann Housekeeper, MD    Allergies  Allergen Reactions  .  Fentanyl Other (See Comments)    confusion    Physical Exam  Vitals  Blood pressure 128/57, pulse 73, temperature 99.1 F (37.3 C), temperature source Oral, resp. rate 17, SpO2 99 %.  General:  Wd, elderly female, demented, with facial droop.  lying in bed in NAD, dtr at bedside.  Psych:  Normal affect and insight, Not Suicidal or Homicidal, Awake Alert, pleasantly demented.  Neuro:   Unable to use LUE.  +facial droop.  No acute deficits  ENT:  Right eye protrudes slightly. Conjunctivae clear, PER. Moist oral mucosa without erythema or exudates.  Neck:  Supple, No lymphadenopathy appreciated  Respiratory:  Symmetrical chest wall movement, Good air movement bilaterally, CTAB.  Cardiac:  RRR, No Murmurs, no LE edema noted, no JVD.    Abdomen:  Positive bowel sounds, Soft, Non tender, Non distended,  No masses appreciated  Skin:  No Cyanosis, Normal Skin Turgor, No Skin Rash or Bruise.  Extremities:  Left arm is flacid.  Left hand is mildly swollen and appears to have healing ecchymosis.  Data Review  CBC  Recent Labs Lab 02/26/15 1214  WBC 13.0*  HGB 12.2  HCT 35.3*  PLT 225  MCV 95.7  MCH 33.1  MCHC 34.6  RDW 15.8*    Chemistries   Recent Labs Lab 02/26/15 1214  NA 135  K 4.4  CL 104  CO2 22  GLUCOSE 99  BUN 9  CREATININE 0.66  CALCIUM 9.4     Urinalysis    Component Value Date/Time   COLORURINE YELLOW 02/26/2015 1309   APPEARANCEUR CLOUDY* 02/26/2015 1309   LABSPEC 1.011 02/26/2015 1309   PHURINE 7.0 02/26/2015 1309   GLUCOSEU NEGATIVE 02/26/2015 1309   HGBUR MODERATE* 02/26/2015 1309   BILIRUBINUR NEGATIVE 02/26/2015 1309   KETONESUR 15* 02/26/2015 1309   PROTEINUR NEGATIVE 02/26/2015 1309   UROBILINOGEN 1.0 02/26/2015 1309   NITRITE POSITIVE* 02/26/2015 1309   LEUKOCYTESUR LARGE* 02/26/2015 1309    Imaging results:   Dg Chest Port 1 View  02/26/2015   CLINICAL DATA:  Left-sided chest pain  EXAM: PORTABLE CHEST - 1 VIEW   COMPARISON:  12/28/2014  FINDINGS: The heart size and mediastinal contours are within normal limits. Both lungs are clear. The visualized skeletal structures are unremarkable. Right AC joint degenerative change noted. Left glenohumeral joint degenerative change noted.  IMPRESSION: No acute cardiopulmonary process.   Electronically Signed   By: Christiana Pellant M.D.   On: 02/26/2015 12:45    My personal review of EKG: NSR, No ST changes noted.  QTc is 470   Assessment & Plan  Principal Problem:   Chest pain Active Problems:   Mitral stenosis   HTN (hypertension)   Left-sided weakness   UTI (lower urinary tract infection)   Foley catheter in place on admission   Falls   Swelling of left hand   81 yo female from SNF with past medical history significant for CVA with left-sided hemiparesis, diastolic dysfunction, chronic kidney disease stage II, mitral and aortic stenosis, and hypertension. She presents with chest pain, UTI and bruised left hand.  Chest pain. Now resolved.  Not reproducible on exam. ? Stable angina.  Will cycle troponin. Monitor on tele. EKG does not show acute changes. PPI, GI Cocktail, ordered.  Continue previous 81 mg and plavix.  UTI Urinalysis appears infected.  Patient has had a foley for over 1 month for acute urinary retention. (she is on flomax) She is working with Urology regarding this.  Foley pulled out 2 weeks ago by patient and replaced by SNF. Placed on Rocephin.  Urine culture pending.  Left hand swelling Patient has had 2 falls in past week.  Last one was Thursday. Will  xray hand to eval for potential AVN and consider splint.  Question of rotator cuff tear will be left for outpatient work up.  I am uncertain what would be done even if their was a rotator cuff tear.  HTN Stable.  Continue home medications.  CKD Creatinine at baseline  Diastolic dysfunction. Euvolemic.  Stable.  Dementia. Stable.  Dtr at bedside.  Continue  citalopram.  Palliative Medicine Recommend Palliative Medicine Consultation for Goals of Care at SNF. Patient's daughter expresses frustration that her mother has had 6 hospitalizations in 32 mos.   DVT Prophylaxis: lovenox  AM Labs Ordered, also please review Full Orders  Family Communication:   dtr at bedside  Code Status:  full  Condition:  Guarded.  Time spent in minutes : [address],  PA-C on 02/26/2015 at 5:10 PM  Between 7am to 7pm - Pager - [phone]  After 7pm go to [email or web address] - password [email or web address]  And look for the night coverage person covering me after hours  [facility]

## 2015-02-27 DIAGNOSIS — R0789 Other chest pain: Secondary | ICD-10-CM | POA: Diagnosis not present

## 2015-02-27 DIAGNOSIS — Z9841 Cataract extraction status, right eye: Secondary | ICD-10-CM | POA: Diagnosis not present

## 2015-02-27 DIAGNOSIS — Y846 Urinary catheterization as the cause of abnormal reaction of the patient, or of later complication, without mention of misadventure at the time of the procedure: Secondary | ICD-10-CM | POA: Diagnosis present

## 2015-02-27 DIAGNOSIS — R630 Anorexia: Secondary | ICD-10-CM | POA: Diagnosis not present

## 2015-02-27 DIAGNOSIS — I129 Hypertensive chronic kidney disease with stage 1 through stage 4 chronic kidney disease, or unspecified chronic kidney disease: Secondary | ICD-10-CM | POA: Diagnosis present

## 2015-02-27 DIAGNOSIS — Z87891 Personal history of nicotine dependence: Secondary | ICD-10-CM | POA: Diagnosis not present

## 2015-02-27 DIAGNOSIS — I69354 Hemiplegia and hemiparesis following cerebral infarction affecting left non-dominant side: Secondary | ICD-10-CM | POA: Diagnosis not present

## 2015-02-27 DIAGNOSIS — Z90710 Acquired absence of both cervix and uterus: Secondary | ICD-10-CM | POA: Diagnosis not present

## 2015-02-27 DIAGNOSIS — R072 Precordial pain: Secondary | ICD-10-CM

## 2015-02-27 DIAGNOSIS — M7989 Other specified soft tissue disorders: Secondary | ICD-10-CM | POA: Diagnosis present

## 2015-02-27 DIAGNOSIS — N182 Chronic kidney disease, stage 2 (mild): Secondary | ICD-10-CM | POA: Diagnosis present

## 2015-02-27 DIAGNOSIS — I672 Cerebral atherosclerosis: Secondary | ICD-10-CM | POA: Diagnosis present

## 2015-02-27 DIAGNOSIS — Z9889 Other specified postprocedural states: Secondary | ICD-10-CM | POA: Diagnosis not present

## 2015-02-27 DIAGNOSIS — N390 Urinary tract infection, site not specified: Secondary | ICD-10-CM | POA: Diagnosis present

## 2015-02-27 DIAGNOSIS — I080 Rheumatic disorders of both mitral and aortic valves: Secondary | ICD-10-CM | POA: Diagnosis present

## 2015-02-27 DIAGNOSIS — Z515 Encounter for palliative care: Secondary | ICD-10-CM | POA: Diagnosis not present

## 2015-02-27 DIAGNOSIS — R451 Restlessness and agitation: Secondary | ICD-10-CM | POA: Diagnosis not present

## 2015-02-27 DIAGNOSIS — R627 Adult failure to thrive: Secondary | ICD-10-CM | POA: Diagnosis present

## 2015-02-27 DIAGNOSIS — M810 Age-related osteoporosis without current pathological fracture: Secondary | ICD-10-CM | POA: Diagnosis present

## 2015-02-27 DIAGNOSIS — B9620 Unspecified Escherichia coli [E. coli] as the cause of diseases classified elsewhere: Secondary | ICD-10-CM | POA: Diagnosis present

## 2015-02-27 DIAGNOSIS — I209 Angina pectoris, unspecified: Secondary | ICD-10-CM | POA: Diagnosis present

## 2015-02-27 DIAGNOSIS — Z9842 Cataract extraction status, left eye: Secondary | ICD-10-CM | POA: Diagnosis not present

## 2015-02-27 DIAGNOSIS — I5032 Chronic diastolic (congestive) heart failure: Secondary | ICD-10-CM | POA: Diagnosis present

## 2015-02-27 DIAGNOSIS — Z7982 Long term (current) use of aspirin: Secondary | ICD-10-CM | POA: Diagnosis not present

## 2015-02-27 DIAGNOSIS — E43 Unspecified severe protein-calorie malnutrition: Secondary | ICD-10-CM | POA: Diagnosis present

## 2015-02-27 DIAGNOSIS — M6289 Other specified disorders of muscle: Secondary | ICD-10-CM

## 2015-02-27 DIAGNOSIS — R079 Chest pain, unspecified: Secondary | ICD-10-CM | POA: Diagnosis present

## 2015-02-27 DIAGNOSIS — T8351XA Infection and inflammatory reaction due to indwelling urinary catheter, initial encounter: Secondary | ICD-10-CM | POA: Diagnosis present

## 2015-02-27 DIAGNOSIS — Z6822 Body mass index (BMI) 22.0-22.9, adult: Secondary | ICD-10-CM | POA: Diagnosis not present

## 2015-02-27 DIAGNOSIS — F419 Anxiety disorder, unspecified: Secondary | ICD-10-CM | POA: Diagnosis present

## 2015-02-27 DIAGNOSIS — Z888 Allergy status to other drugs, medicaments and biological substances status: Secondary | ICD-10-CM | POA: Diagnosis not present

## 2015-02-27 DIAGNOSIS — Z66 Do not resuscitate: Secondary | ICD-10-CM | POA: Diagnosis present

## 2015-02-27 DIAGNOSIS — I10 Essential (primary) hypertension: Secondary | ICD-10-CM | POA: Diagnosis not present

## 2015-02-27 DIAGNOSIS — F0151 Vascular dementia with behavioral disturbance: Secondary | ICD-10-CM | POA: Diagnosis present

## 2015-02-27 DIAGNOSIS — R531 Weakness: Secondary | ICD-10-CM | POA: Diagnosis not present

## 2015-02-27 LAB — CBC
HCT: 35.2 % — ABNORMAL LOW (ref 36.0–46.0)
Hemoglobin: 12.1 g/dL (ref 12.0–15.0)
MCH: 32.9 pg (ref 26.0–34.0)
MCHC: 34.4 g/dL (ref 30.0–36.0)
MCV: 95.7 fL (ref 78.0–100.0)
Platelets: 210 10*3/uL (ref 150–400)
RBC: 3.68 MIL/uL — ABNORMAL LOW (ref 3.87–5.11)
RDW: 15.8 % — ABNORMAL HIGH (ref 11.5–15.5)
WBC: 10.2 10*3/uL (ref 4.0–10.5)

## 2015-02-27 LAB — TROPONIN I
Troponin I: 0.04 ng/mL — ABNORMAL HIGH (ref <0.031)
Troponin I: <0.03 ng/mL (ref <0.031)

## 2015-02-27 LAB — MRSA PCR SCREENING: MRSA by PCR: POSITIVE — AB

## 2015-02-27 MED ORDER — HYDROCODONE-ACETAMINOPHEN 5-325 MG PO TABS: 1.0000 | ORAL_TABLET | ORAL | Status: DC | PRN

## 2015-02-27 MED ORDER — MUPIROCIN 2 % EX OINT
1.0000 "application " | TOPICAL_OINTMENT | Freq: Two times a day (BID) | CUTANEOUS | Status: DC
  Administered 2015-02-27 – 2015-03-03 (×7): 1 via NASAL
  Filled 2015-02-27: qty 22

## 2015-02-27 MED ORDER — CHLORHEXIDINE GLUCONATE CLOTH 2 % EX PADS
6.0000 | MEDICATED_PAD | Freq: Every day | CUTANEOUS | Status: AC
  Administered 2015-02-27 – 2015-03-03 (×5): 6 via TOPICAL

## 2015-02-27 MED ORDER — ENSURE ENLIVE PO LIQD
237.0000 mL | Freq: Two times a day (BID) | ORAL | Status: DC
Start: 2015-02-27 — End: 2015-03-01
  Administered 2015-02-27 – 2015-03-01 (×3): 237 mL via ORAL

## 2015-02-27 MED ORDER — LORAZEPAM 2 MG/ML IJ SOLN: 0.5000 mg | INTRAMUSCULAR | Status: DC | PRN

## 2015-02-27 MED ORDER — LORAZEPAM 2 MG/ML IJ SOLN
0.5000 mg | Freq: Once | INTRAMUSCULAR | Status: DC
  Filled 2015-02-27: qty 1

## 2015-02-27 NOTE — Progress Notes (Signed)
Pt refusing to take medications spitting them out and told this nurse to leave the room. Pt verbally abusive to staff. Laying in bed comfortably.

## 2015-02-27 NOTE — Progress Notes (Signed)
UR completed 

## 2015-02-27 NOTE — Progress Notes (Signed)
Troponin of 0.04, no s/s notified MD, will continue to monitor, thanks, Lavonda Jumbo RN

## 2015-02-27 NOTE — Progress Notes (Signed)
[facility] PROGRESS NOTE  Monica Blankenship:[id number] DOB: [birth year] DOA: 02/26/2015 PCP: Georgann Housekeeper, MD  Brief summary  Monica McMurrayis a 81 y.o. female, with a past medical history significant for dementia with behavioral disturbance, CVA with left-sided hemiparesis, diastolic dysfunction, chronic kidney disease stage II, indwelling Foley catheter placed for acute urinary retention several months ago which has not yet been removed, recurrent urinary tract infections, failure to thrive with a 20 pound weight loss in the last couple of months due to not eating, mitral and aortic stenosis, and hypertension who to the emergency department from [facility] with chest pain.  The daughter reported that the patient developed chest pain this morning between 9 and 9:30 it lasted about 25 minutes, and resolved on its own. The patient reported that the pain was located in her sternal area and radiated in a band like fashion across her chest and felt like a heavy pressure.  She has had 2 falls in the last 2 weeks. Her left hand has been swollen and bruised and there was concern she tore her rotator cuff.  She was scheduled for an orthopedic surgery appointment early next week.  In the ER the patient was found to have a urinary tract infection. Her point-of-care troponin was 0.02. EKG demonstrated no acute changes of ST elevation or depression. This Rhatigan will be admitted for chest pain rule out and treatment of her urinary tract infection.    Assessment/Plan  Chest pain, now resolved. Not reproducible on exam. -  Tele:  NSR -  D/c telemetry -  Troponin:  Third elevated troponin at 0.04 but follow up was 0.03.   -  EKG stable -  PPI, GI Cocktail, ordered -  Continue previous 81 mg and plavix -  ECHO from 12/2014 EF 60%, moderate LVH, mild AS, significant mitral stenosis, moderate to severe LA dilation, RV moderately dilated, RA moderate to severe dilation  CAUTI  due to chronic indwelling catheter, present at time of admission -  F/u urine culture -  D/c foley and voiding trial -  Continue ceftriaxone  Left hand swelling, patient has had 2 falls in past week. Last one was Thursday. -  XR hand:  Osteoporosis, no obvious fracture -  Question of rotator cuff tear will be left for outpatient work up -  Duplex LUE  HTN, BP elevated. patient refusing to take her medications secondary to her dementia -  Continue home medications for now -  Will discuss utility of continuing medications if dementia prognosis is poor tomorrow  Diastolic dysfunction.  euvolemic. Stable.  Dementia with agitation, severe protein calorie malnutrition, weight loss and FTT -  Add IV ativan  -  Continue citalopram and ritalin -  Regular diet -  Ensure  -  Nutrition consultation  Palliative Medicine, palliative medicine already following at SNF -  Discussed GOC with daughter and will continue to readdress -  Daughter is reluctant to allow hospice control over whether the patient gets hospitalized or not -  Reviewed MOST form:  DNR, LIMITED, + ABX, + IVF, -FEEDING TUBE -  Will continue conversation about GOC because I think a more comfort/palliative approach may be best even if the patient does not enroll in hospice care  Diet:  Change to regular  Access:  PIV IVF:  OFF Proph:  lovenox  Code Status: DNR Family Communication: patient and her daughter who is HPOA Disposition Plan: pending urine culture    Consultants:  None  Procedures:  CXR  Left hand XR  Antibiotics:  Ceftriaxone 3/26 >>   HPI/Subjective:  Patient declined medications.  Agitated per nursing staff and pulling at IV and catheter.    Objective: Filed Vitals:   02/26/15 2048 02/26/15 2327 02/27/15 0100 02/27/15 0431  BP: 178/60 143/64 143/53 137/63  Pulse: 72 66 71 85  Temp: 98.1 F (36.7 C)  97.6 F (36.4 C) 98.4 F (36.9 C)  TempSrc: Oral  Oral Oral  Resp: 18  18 16   Height:       Weight:    62.8 kg (138 lb 7.2 oz)  SpO2: 100%  99% 100%    Intake/Output Summary (Last 24 hours) at 02/27/15 1246 Last data filed at 02/27/15 1134  Gross per 24 hour  Intake 463.83 ml  Output   1175 ml  Net -711.17 ml   Filed Weights   02/26/15 1750 02/27/15 0431  Weight: 63.3 kg (139 lb 8.8 oz) 62.8 kg (138 lb 7.2 oz)    Exam:   General:  Average weight female, No acute distress  HEENT:  NCAT, MMM  Cardiovascular:  RRR, nl S1, S2, 3/6 systolic murmur LSB and across chest, 2+ pulses, warm extremities  Respiratory:  CTAB, no increased WOB  Abdomen:   NABS, soft, NT/ND  MSK:   Normal tone and bulk, no LEE, LUE with bruising around thumb and 1+ pitting edema  Neuro:  4/5 right upper and lower extremities, unable to lift left arm  Data Reviewed: Basic Metabolic Panel:  Recent Labs Lab 02/26/15 1214  NA 135  K 4.4  CL 104  CO2 22  GLUCOSE 99  BUN 9  CREATININE 0.66  CALCIUM 9.4   Liver Function Tests: No results for input(s): AST, ALT, ALKPHOS, BILITOT, PROT, ALBUMIN in the last 168 hours. No results for input(s): LIPASE, AMYLASE in the last 168 hours. No results for input(s): AMMONIA in the last 168 hours. CBC:  Recent Labs Lab 02/26/15 1214 02/27/15 0100  WBC 13.0* 10.2  HGB 12.2 12.1  HCT 35.3* 35.2*  MCV 95.7 95.7  PLT 225 210   Cardiac Enzymes:  Recent Labs Lab 02/26/15 2016 02/26/15 2158 02/27/15 0100 02/27/15 0852  TROPONINI 0.03 0.03 0.04* <0.03   BNP (last 3 results)  Recent Labs  12/15/14 0645  BNP 1132.1*    ProBNP (last 3 results)  Recent Labs  04/08/14 0807 04/09/14 0909  PROBNP 5143.0* 16685.0*    CBG: No results for input(s): GLUCAP in the last 168 hours.  Recent Results (from the past 240 hour(s))  MRSA PCR Screening     Status: Abnormal   Collection Time: 02/26/15  9:06 PM  Result Value Ref Range Status   MRSA by PCR POSITIVE (A) NEGATIVE Final    Comment:        The GeneXpert MRSA Assay  (FDA approved for NASAL specimens only), is one component of a comprehensive MRSA colonization surveillance program. It is not intended to diagnose MRSA infection nor to guide or monitor treatment for MRSA infections. RESULT CALLED TO, READ BACK BY AND VERIFIED WITH: FUTRELL,M RN 02/28/15 AT 0050 SKEEN,P      Studies: Dg Chest Port 1 View  02/26/2015   CLINICAL DATA:  Left-sided chest pain  EXAM: PORTABLE CHEST - 1 VIEW  COMPARISON:  12/28/2014  FINDINGS: The heart size and mediastinal contours are within normal limits. Both lungs are clear. The visualized skeletal structures are unremarkable. Right AC joint degenerative change noted. Left glenohumeral joint degenerative change noted.  IMPRESSION: No acute cardiopulmonary process.   Electronically Signed   By: Christiana Pellant M.D.   On: 02/26/2015 12:45   Dg Hand Complete Left  02/26/2015   CLINICAL DATA:  Larey Seat and injured left hand while dancing 2 days ago. Persistent pain and bruising.  EXAM: LEFT HAND - COMPLETE 3+ VIEW  COMPARISON:  None.  FINDINGS: There are advanced degenerative changes involving the hand and wrist along with osteoporosis. No obvious fractures identified. No true lateral of the hand or wrist.  IMPRESSION: Advanced degenerative changes and osteoporosis but no obvious acute fracture.   Electronically Signed   By: Rudie Meyer M.D.   On: 02/26/2015 17:35    Scheduled Meds: . aspirin EC  81 mg Oral Daily  . cefTRIAXone (ROCEPHIN)  IV  1 g Intravenous Q24H  . Chlorhexidine Gluconate Cloth  6 each Topical Q0600  . citalopram  20 mg Oral Daily  . clopidogrel  75 mg Oral Daily  . enoxaparin (LOVENOX) injection  40 mg Subcutaneous Q24H  . folic acid  1 mg Oral Daily  . hydroxypropyl methylcellulose / hypromellose  1 drop Both Eyes QHS  . LORazepam  0.5 mg Intravenous Once  . Melatonin  3 mg Oral QHS  . methylphenidate  2.5 mg Oral Daily  . mupirocin ointment  1 application Nasal BID  . pantoprazole  40 mg Oral Daily   . polyethylene glycol  17 g Oral QHS  . sodium chloride  3 mL Intravenous Q12H  . tamsulosin  0.4 mg Oral QHS   Continuous Infusions:   Principal Problem:   Chest pain Active Problems:   Mitral stenosis   HTN (hypertension)   Left-sided weakness   UTI (lower urinary tract infection)   Foley catheter in place on admission   Falls   Swelling of left hand   Atypical chest pain   Complicated UTI (urinary tract infection)   Essential hypertension    Time spent: 30 min    Oneal Schoenberger  [facility] Pager [phone]. If 7PM-7AM, please contact night-coverage at [email or web address], password [facility] 02/27/2015, 12:46 PM  LOS: 1 day

## 2015-02-27 NOTE — Evaluation (Signed)
Physical Therapy Evaluation Patient Details Name: Monica Blankenship MRN: [id number] DOB: 01-17-1929 Today's Date: 02/27/2015   History of Present Illness  81 yo female with hx of stroke with left hemiplegia presenting with chest pain. History difficult to obtain. Per daughter pt "has her good days and her bad days", but her cognition is worse since stroke. Pt currently denies chest pain. She denied having any associated symptoms. EKG shows some nonspecific t wave changes which appear to be new; work up pending, patient under observation.  Clinical Impression  Patient demonstrates deficits in functional mobility as indicated below. Will need continued skilled PT to address deficits and maximize function. Will see as indicated and progress as tolerated.    Follow Up Recommendations SNF;Supervision/Assistance - 24 hour    Equipment Recommendations  None recommended by PT    Recommendations for Other Services       Precautions / Restrictions Precautions Precautions: Fall      Mobility  Bed Mobility Overal bed mobility: Needs Assistance Bed Mobility: Supine to Sit     Supine to sit: Max assist     General bed mobility comments: Patient required assist secondary to cognition  Transfers Overall transfer level: Needs assistance Equipment used:  (face to face with gait belt) Transfers: Sit to/from Stand;Stand Pivot Transfers Sit to Stand: Max assist Stand pivot transfers: Max assist       General transfer comment: increased assist secondary to patient confusion and inability to follow commands at this tiem  Ambulation/Gait             General Gait Details: unable to assess this session  Stairs            Wheelchair Mobility    Modified Rankin (Stroke Patients Only)       Balance Overall balance assessment: Needs assistance   Sitting balance-Leahy Scale: Fair Sitting balance - Comments: able to sit EOB without physical assist   Standing balance  support: Bilateral upper extremity supported;During functional activity Standing balance-Leahy Scale: Poor Standing balance comment: required assist secondary to cognitive deficits                             Pertinent Vitals/Pain Pain Assessment: No/denies pain    Home Living Family/patient expects to be discharged to:: Skilled nursing facility                 Additional Comments: Pt lives with 64 yo husband and disabled son.    Prior Function Level of Independence: Needs assistance      ADL's / Homemaking Assistance Needed: Assisted for bathing, dressing, toileting.  Was self feeding and grooming with her R UE.  Comments: per chart review from january admission: patient has been at [facility] since CVA     Hand Dominance   Dominant Hand: Left    Extremity/Trunk Assessment   Upper Extremity Assessment: Generalized weakness (hx of hemiplegia)           Lower Extremity Assessment: Generalized weakness (hx of hemiplegia)         Communication   Communication: Expressive difficulties  Cognition Arousal/Alertness: Lethargic Behavior During Therapy: Anxious;Flat affect Overall Cognitive Status: History of cognitive impairments - at baseline                      General Comments      Exercises        Assessment/Plan    PT  Assessment Patient needs continued PT services  PT Diagnosis Difficulty walking;Altered mental status   PT Problem List Decreased strength;Decreased activity tolerance;Decreased balance;Decreased mobility;Decreased cognition  PT Treatment Interventions DME instruction;Gait training;Functional mobility training;Therapeutic activities;Therapeutic exercise;Balance training;Patient/family education   PT Goals (Current goals can be found in the Care Plan section) Acute Rehab PT Goals PT Goal Formulation: Patient unable to participate in goal setting Time For Goal Achievement: 03/13/15 Potential to Achieve Goals:  Fair    Frequency Min 2X/week   Barriers to discharge        Co-evaluation               End of Session Equipment Utilized During Treatment: Gait belt Activity Tolerance: Treatment limited secondary to agitation Patient left: in chair;with call bell/phone within reach;with chair alarm set Nurse Communication: Mobility status         Time: 1610-9604 PT Time Calculation (min) (ACUTE ONLY): 18 min   Charges:   PT Evaluation $Initial PT Evaluation Tier I: 1 Procedure     PT G CodesFabio Asa 03-16-15, 2:59 PM Charlotte Crumb, PT DPT  [phone]

## 2015-02-28 ENCOUNTER — Encounter (HOSPITAL_COMMUNITY): Payer: Self-pay | Admitting: Internal Medicine

## 2015-02-28 DIAGNOSIS — T83511A Infection and inflammatory reaction due to indwelling urethral catheter, initial encounter: Secondary | ICD-10-CM

## 2015-02-28 DIAGNOSIS — N390 Urinary tract infection, site not specified: Secondary | ICD-10-CM

## 2015-02-28 DIAGNOSIS — E43 Unspecified severe protein-calorie malnutrition: Secondary | ICD-10-CM

## 2015-02-28 DIAGNOSIS — F0150 Vascular dementia without behavioral disturbance: Secondary | ICD-10-CM

## 2015-02-28 LAB — BASIC METABOLIC PANEL
Anion gap: 9 (ref 5–15)
BUN: 8 mg/dL (ref 6–23)
CHLORIDE: 105 mmol/L (ref 96–112)
CO2: 21 mmol/L (ref 19–32)
Calcium: 9.2 mg/dL (ref 8.4–10.5)
Creatinine, Ser: 0.63 mg/dL (ref 0.50–1.10)
GFR calc Af Amer: >90 mL/min (ref >90)
GFR, EST NON AFRICAN AMERICAN: 81 mL/min — AB (ref >90)
GLUCOSE: 94 mg/dL (ref 70–99)
Potassium: 3.2 mmol/L — ABNORMAL LOW (ref 3.5–5.1)
Sodium: 135 mmol/L (ref 135–145)

## 2015-02-28 MED ORDER — KCL IN DEXTROSE-NACL 20-5-0.45 MEQ/L-%-% IV SOLN
INTRAVENOUS | Status: DC
  Administered 2015-02-28 – 2015-03-01 (×2): via INTRAVENOUS
  Filled 2015-02-28 (×2): qty 1000

## 2015-02-28 MED ORDER — POTASSIUM CHLORIDE CRYS ER 20 MEQ PO TBCR
40.0000 meq | EXTENDED_RELEASE_TABLET | Freq: Once | ORAL | Status: AC
  Administered 2015-02-28: 40 meq via ORAL
  Filled 2015-02-28: qty 2

## 2015-02-28 NOTE — Plan of Care (Signed)
Problem: Phase I Progression Outcomes Goal: Voiding-avoid urinary catheter unless indicated Outcome: Not Met (add Reason) Patient incontinent

## 2015-02-28 NOTE — Progress Notes (Addendum)
INITIAL NUTRITION ASSESSMENT  DOCUMENTATION CODES Per approved criteria  -Not Applicable   INTERVENTION: Continue Ensure Enlive po BID, each supplement provides 350 kcal and 20 grams of protein Provide Magic Cup ice cream with meals, each supplement provides 290 kcal and 9 grams of protein Provide HS snack daily   NUTRITION DIAGNOSIS: Malnutrition related to FTT, advanced age and dementia as evidenced by 20% weight loss in 2 months.   Goal: Pt to meet >/= 90% of their estimated nutrition needs   Monitor:  PO intake, weight trend, labs  Reason for Assessment: Consult for Assessment of Nutrition Status/Requirements  81 y.o. female  Admitting Dx: Catheter-associated urinary tract infection  ASSESSMENT: 81 y.o. female, with a past medical history significant for dementia with behavioral disturbance, CVA with left-sided hemiparesis, diastolic dysfunction, chronic kidney disease stage II, indwelling Foley catheter, recurrent urinary tract infections, failure to thrive with a 20 pound weight loss in the last couple of months due to not eating, and hypertension who to the emergency department from [facility] with chest pain.  Pt care being provide by nurse tech at time of visit. Per nursing notes pt is only eating a few bites to 50% of meals. Weight history shows pt has lost 20% of her body weight in the past 2 months (34 lbs). Per MD note, pt with severe protein calorie malnutrition, weight loss and FTT.  Suspect that patient meets nutrition-related criteria for malnutrition but, unable to determine at this time; will follow-up to complete nutrition assessment when pt is available.   Labs: low potassium  Height: Ht Readings from Last 1 Encounters:  02/26/15 5\' 6"  (1.676 m)    Weight: Wt Readings from Last 1 Encounters:  02/28/15 136 lb 7.3 oz (61.896 kg)    Ideal Body Weight: 130 lbs  % Ideal Body Weight: 105%  Wt Readings from Last 10 Encounters:   02/28/15 136 lb 7.3 oz (61.896 kg)  12/28/14 170 lb (77.111 kg)  12/22/14 160 lb 0.9 oz (72.6 kg)  11/15/14 175 lb (79.379 kg)  11/14/14 178 lb 12.8 oz (81.103 kg)  07/20/14 175 lb 1.9 oz (79.434 kg)  06/30/14 177 lb (80.287 kg)  05/24/14 169 lb 8 oz (76.885 kg)  04/27/14 179 lb (81.194 kg)  04/13/14 179 lb 4.8 oz (81.33 kg)    Usual Body Weight: 170-179 lb  % Usual Body Weight: 80%  BMI:  Body mass index is 22.04 kg/(m^2).  Estimated Nutritional Needs: Kcal: 1500-1700 Protein: 75-85 grams Fluid: 1.7 L/day  Skin: intact  Diet Order: Diet regular Room service appropriate?: Yes; Fluid consistency:: Thin  EDUCATION NEEDS: -No education needs identified at this time   Intake/Output Summary (Last 24 hours) at 02/28/15 1537 Last data filed at 02/28/15 1510  Gross per 24 hour  Intake 460.83 ml  Output    201 ml  Net 259.83 ml    Last BM: 3/26   Labs:   Recent Labs Lab 02/26/15 1214 02/28/15 0435  NA 135 135  K 4.4 3.2*  CL 104 105  CO2 22 21  BUN 9 8  CREATININE 0.66 0.63  CALCIUM 9.4 9.2  GLUCOSE 99 94    CBG (last 3)  No results for input(s): GLUCAP in the last 72 hours.  Scheduled Meds: . aspirin EC  81 mg Oral Daily  . cefTRIAXone (ROCEPHIN)  IV  1 g Intravenous Q24H  . Chlorhexidine Gluconate Cloth  6 each Topical Q0600  . citalopram  20 mg Oral  Daily  . clopidogrel  75 mg Oral Daily  . enoxaparin (LOVENOX) injection  40 mg Subcutaneous Q24H  . feeding supplement (ENSURE ENLIVE)  237 mL Oral BID BM  . folic acid  1 mg Oral Daily  . hydroxypropyl methylcellulose / hypromellose  1 drop Both Eyes QHS  . LORazepam  0.5 mg Intravenous Once  . Melatonin  3 mg Oral QHS  . methylphenidate  2.5 mg Oral Daily  . mupirocin ointment  1 application Nasal BID  . pantoprazole  40 mg Oral Daily  . polyethylene glycol  17 g Oral QHS  . sodium chloride  3 mL Intravenous Q12H  . tamsulosin  0.4 mg Oral QHS    Continuous Infusions: . dextrose 5 % and 0.45  % NaCl with KCl 20 mEq/L 50 mL/hr at 02/28/15 5726    Past Medical History  Diagnosis Date  . Allergy   . Anxiety   . Insomnia   . Osteoarthritis   . Anemia   . Obesity   . Diastolic dysfunction   . CKD (chronic kidney disease)   . Mitral stenosis     with moderate MR on echo 4/14  . Aortic stenosis     mild by echo 4/14  . Aortic regurgitation     mild to moderate by echo 4/14  . HTN (hypertension)   . Stroke   . Vascular dementia     Past Surgical History  Procedure Laterality Date  . Eye surgery    . Abdominal hysterectomy    . Appendectomy    . Cataract extraction, bilateral    . Tee without cardioversion N/A 04/13/2014    Procedure: TRANSESOPHAGEAL ECHOCARDIOGRAM (TEE);  Surgeon: Vesta Mixer, MD;  Location: [facility] ENDOSCOPY;  Service: Cardiovascular;  Laterality: N/A;    Ian Malkin RD, LDN Inpatient Clinical Dietitian Pager: [phone] After Hours Pager: [phone]

## 2015-02-28 NOTE — Progress Notes (Addendum)
[facility] PROGRESS NOTE  Monica [facility]hip QHU:[id number] DOB: [birth year] DOA: 02/26/2015 PCP: Georgann Housekeeper, MD  Brief summary  Monica McMurrayis a 81 y.o. female, with a past medical history significant for dementia with behavioral disturbance, CVA with left-sided hemiparesis, diastolic dysfunction, chronic kidney disease stage II, indwelling Foley catheter placed for acute urinary retention several months ago which has not yet been removed, recurrent urinary tract infections, failure to thrive with a 20 pound weight loss in the last couple of months due to not eating, mitral and aortic stenosis, and hypertension who [facility] the [facility] with chest pain.  The daughter reported that the patient developed chest pain this morning between 9 and 9:30 it lasted about 25 minutes, and resolved on its own. The patient reported that the pain was located in her sternal area and radiated in a band like fashion across her chest and felt like a heavy pressure.  She has had 2 falls in the last 2 weeks. Her left hand has been swollen and bruised and there was concern she tore her rotator cuff.  She was scheduled for an orthopedic surgery appointment early next week.  In the ER the patient was found to have a urinary tract infection. Her point-of-care troponin was 0.02. EKG demonstrated no acute changes of ST elevation or depression.  She was admitted for chest pain rule out and treatment of her urinary tract infection.  She has had problems with refusing to take medications and with agitation.  I have discussed hospice care and transitioning to full comfort care with the HPOA, [facility].  She is going to discuss the possibility of entering hospice with her dad and we will have a family meeting tomorrow to discuss.     Assessment/Plan  Chest pain, now resolved. Not reproducible on exam. -  Tele:  NSR -  D/c telemetry -  Troponin:  Third elevated troponin at  0.04 but follow up was 0.03.   -  EKG stable -  PPI, GI Cocktail, ordered -  Continue previous 81 mg and plavix -  ECHO from 12/2014 EF 60%, moderate LVH, mild AS, significant mitral stenosis, moderate to severe LA dilation, RV moderately dilated, RA moderate to severe dilation  CAUTI due to chronic indwelling catheter, present at time of admission -  F/u urine culture -  Voiding spontaneously after removal of foley -  Continue ceftriaxone  Progressive vascular dementia with agitation, severe protein calorie malnutrition, weight loss and FTT -  Continue IV ativan  -  Continue citalopram and ritalin -  Regular diet -  Ensure  -  Nutrition consultation -  Patient is high risk of readmission for recurrent infections, dehydration.  See below regarding GOC conversation -  Start IVF pending conversation tomorrow since not eating or drinking  Left hand swelling, patient has had 2 falls in past week. Last one was Thursday. -  XR hand:  Osteoporosis, no obvious fracture -  Question of rotator cuff tear will be left for outpatient work up -  Duplex LUE pending  HTN, BP mildly elevated. patient refusing to take her medications secondary to her dementia -  Hold BP medications for now  -  Will discuss utility of continuing medications again tomorrow  Diastolic dysfunction.  euvolemic. Stable.  Palliative Medicine, palliative medicine already following at SNF -  Discussed GOC with daughter and will meet again tomorrow -  Daughter is reluctant to allow hospice control over whether the patient gets  hospitalized or not -  Reviewed MOST form:  DNR, LIMITED, + ABX, + IVF, -FEEDING TUBE  Diet:  Regular  Access:  PIV IVF:  yes Proph:  lovenox  Code Status: DNR Family Communication: patient and her daughter who is HPOA Disposition Plan: pending urine culture, GOC again tomorrow   Consultants:  None  Procedures:  CXR  Left hand XR  Antibiotics:  Ceftriaxone 3/26 >>    HPI/Subjective:  Patient declined medications.  Agitated per nursing staff and pulling at IV and catheter.    Objective: Filed Vitals:   02/27/15 0431 02/27/15 1444 02/27/15 2127 02/28/15 0530  BP: 137/63 141/49 141/85 138/67  Pulse: 85 67 72 69  Temp: 98.4 F (36.9 C) 98.2 F (36.8 C) 98.6 F (37 C) 98.4 F (36.9 C)  TempSrc: Oral Oral Oral Oral  Resp: 16 16 18 18   Height:      Weight: 62.8 kg (138 lb 7.2 oz)   61.896 kg (136 lb 7.3 oz)  SpO2: 100%  100% 100%    Intake/Output Summary (Last 24 hours) at 02/28/15 1411 Last data filed at 02/28/15 1355  Gross per 24 hour  Intake 360.83 ml  Output      0 ml  Net 360.83 ml   Filed Weights   02/26/15 1750 02/27/15 0431 02/28/15 0530  Weight: 63.3 kg (139 lb 8.8 oz) 62.8 kg (138 lb 7.2 oz) 61.896 kg (136 lb 7.3 oz)    Exam:   General:  Average weight female, No acute distress  HEENT:  NCAT, MMM  Cardiovascular:  RRR, nl S1, S2, 3/6 systolic murmur LSB and across chest, 2+ pulses, warm extremities  Respiratory:  CTAB, no increased WOB  Abdomen:   NABS, soft, NT/ND  MSK:   Normal tone and bulk, no LEE, LUE with bruising around thumb and 1+ pitting edema  Neuro:  unable to lift left arm  Data Reviewed: Basic Metabolic Panel:  Recent Labs Lab 02/26/15 1214 02/28/15 0435  NA 135 135  K 4.4 3.2*  CL 104 105  CO2 22 21  GLUCOSE 99 94  BUN 9 8  CREATININE 0.66 0.63  CALCIUM 9.4 9.2   Liver Function Tests: No results for input(s): AST, ALT, ALKPHOS, BILITOT, PROT, ALBUMIN in the last 168 hours. No results for input(s): LIPASE, AMYLASE in the last 168 hours. No results for input(s): AMMONIA in the last 168 hours. CBC:  Recent Labs Lab 02/26/15 1214 02/27/15 0100  WBC 13.0* 10.2  HGB 12.2 12.1  HCT 35.3* 35.2*  MCV 95.7 95.7  PLT 225 210   Cardiac Enzymes:  Recent Labs Lab 02/26/15 2016 02/26/15 2158 02/27/15 0100 02/27/15 0852  TROPONINI 0.03 0.03 0.04* <0.03   BNP (last 3  results)  Recent Labs  12/15/14 0645  BNP 1132.1*    ProBNP (last 3 results)  Recent Labs  04/08/14 0807 04/09/14 0909  PROBNP 5143.0* 16685.0*    CBG: No results for input(s): GLUCAP in the last 168 hours.  Recent Results (from the past 240 hour(s))  MRSA PCR Screening     Status: Abnormal   Collection Time: 02/26/15  9:06 PM  Result Value Ref Range Status   MRSA by PCR POSITIVE (A) NEGATIVE Final    Comment:        The GeneXpert MRSA Assay (FDA approved for NASAL specimens only), is one component of a comprehensive MRSA colonization surveillance program. It is not intended to diagnose MRSA infection nor to guide or monitor treatment for MRSA  infections. RESULT CALLED TO, READ BACK BY AND VERIFIED WITH: FUTRELL,M RN [phone] AT 0050 SKEEN,P      Studies: Dg Hand Complete Left  02/26/2015   CLINICAL DATA:  Larey Seat and injured left hand while dancing 2 days ago. Persistent pain and bruising.  EXAM: LEFT HAND - COMPLETE 3+ VIEW  COMPARISON:  None.  FINDINGS: There are advanced degenerative changes involving the hand and wrist along with osteoporosis. No obvious fractures identified. No true lateral of the hand or wrist.  IMPRESSION: Advanced degenerative changes and osteoporosis but no obvious acute fracture.   Electronically Signed   By: Rudie Meyer M.D.   On: 02/26/2015 17:35    Scheduled Meds: . aspirin EC  81 mg Oral Daily  . cefTRIAXone (ROCEPHIN)  IV  1 g Intravenous Q24H  . Chlorhexidine Gluconate Cloth  6 each Topical Q0600  . citalopram  20 mg Oral Daily  . clopidogrel  75 mg Oral Daily  . enoxaparin (LOVENOX) injection  40 mg Subcutaneous Q24H  . feeding supplement (ENSURE ENLIVE)  237 mL Oral BID BM  . folic acid  1 mg Oral Daily  . hydroxypropyl methylcellulose / hypromellose  1 drop Both Eyes QHS  . LORazepam  0.5 mg Intravenous Once  . Melatonin  3 mg Oral QHS  . methylphenidate  2.5 mg Oral Daily  . mupirocin ointment  1 application Nasal BID  .  pantoprazole  40 mg Oral Daily  . polyethylene glycol  17 g Oral QHS  . sodium chloride  3 mL Intravenous Q12H  . tamsulosin  0.4 mg Oral QHS   Continuous Infusions: . dextrose 5 % and 0.45 % NaCl with KCl 20 mEq/L 50 mL/hr at 02/28/15 5427    Principal Problem:   Chest pain Active Problems:   Mitral stenosis   HTN (hypertension)   Left-sided weakness   UTI (lower urinary tract infection)   Foley catheter in place on admission   Falls   Swelling of left hand   Atypical chest pain   Complicated UTI (urinary tract infection)   Essential hypertension   UTI (urinary tract infection)    Time spent: 30 min    Eleyna Brugh, [facility]  [facility] Pager [phone]. If 7PM-7AM, please contact night-coverage at [email or web address], password [facility] 02/28/2015, 2:11 PM  LOS: 2 days

## 2015-02-28 NOTE — Progress Notes (Signed)
OT Cancellation Note  Patient Details Name: Monica Blankenship MRN: [id number] DOB: Apr 21, 1929   Cancelled Treatment:    Reason Eval/Treat Not Completed: OT screened. Pt is from SNF and current D/C plan is to return to SNF. No apparent immediate acute care OT needs, therefore will defer OT to SNF. If OT eval is needed please call Acute Rehab Dept. at [phone] or text page OT at [phone].    Earlie Raveling OTR/L [phone] 02/28/2015, 11:42 AM

## 2015-02-28 NOTE — Clinical Documentation Improvement (Signed)
A cause and effect relationship may not be assumed and must be documented by a provider. Please clarify the relationship, if any, between Foley Catheter and UTI.  Are the conditions: ? Due to or associated with each other ? Other (please specify) ? Unrelated to each other ? Unable to determine ? Unknown    Supporting Information: (As per notes) " Pt has Chronic Foley Catheter" and UTI  Thank You, Nevin Bloodgood, RN, BSN, CCDS,Clinical Documentation Specialist:  [phone]  [phone]=[facility] [place]- Health Information Management

## 2015-03-01 DIAGNOSIS — E43 Unspecified severe protein-calorie malnutrition: Secondary | ICD-10-CM

## 2015-03-01 DIAGNOSIS — T8351XA Infection and inflammatory reaction due to indwelling urinary catheter, initial encounter: Principal | ICD-10-CM

## 2015-03-01 DIAGNOSIS — Z9889 Other specified postprocedural states: Secondary | ICD-10-CM

## 2015-03-01 MED ORDER — ENOXAPARIN SODIUM 40 MG/0.4ML ~~LOC~~ SOLN
40.0000 mg | SUBCUTANEOUS | Status: DC
  Administered 2015-03-01 – 2015-03-02 (×2): 40 mg via SUBCUTANEOUS
  Filled 2015-03-01 (×3): qty 0.4

## 2015-03-01 MED ORDER — LORAZEPAM 1 MG PO TABS
1.0000 mg | ORAL_TABLET | ORAL | Status: DC | PRN
  Administered 2015-03-03: 1 mg via ORAL
  Filled 2015-03-01: qty 1

## 2015-03-01 MED ORDER — CAMPHOR-MENTHOL 0.5-0.5 % EX LOTN
TOPICAL_LOTION | CUTANEOUS | Status: DC | PRN
  Filled 2015-03-01: qty 222

## 2015-03-01 MED ORDER — LORAZEPAM 1 MG PO TABS: 1.0000 mg | ORAL_TABLET | Freq: Four times a day (QID) | ORAL | Status: DC | PRN

## 2015-03-01 MED ORDER — SULFAMETHOXAZOLE-TRIMETHOPRIM 800-160 MG PO TABS
1.0000 | ORAL_TABLET | Freq: Two times a day (BID) | ORAL | Status: DC
Start: 2015-03-01 — End: 2015-03-01
  Filled 2015-03-01: qty 1

## 2015-03-01 NOTE — Care Management Note (Unsigned)
    Page 1 of 1   03/01/2015     4:21:59 PM CARE MANAGEMENT NOTE 03/01/2015  Patient:  [facility]   Account Number:  [id number]  Date Initiated:  03/01/2015  Documentation initiated by:  Shandon Burlingame  Subjective/Objective Assessment:   Pt adm on 02/26/15 with falls, UTI.  PTA, pt resided at [facility].     Action/Plan:   CSW consulted to facilitate return to SNF when medically stable.  Will follow progress.   Anticipated DC Date:  03/02/2015   Anticipated DC Plan:  SKILLED NURSING FACILITY  In-house referral  Clinical Social Worker      DC Planning Services  CM consult      Choice offered to / List presented to:             Status of service:  In process, will continue to follow Medicare Important Message given?  YES (If response is "NO", the following Medicare IM given date fields will be blank) Date Medicare IM given:  03/01/2015 Medicare IM given by:  Lyndy Russman Date Additional Medicare IM given:   Additional Medicare IM given by:    Discharge Disposition:    Per UR Regulation:  Reviewed for med. necessity/level of care/duration of stay  If discussed at Long Length of Stay Meetings, dates discussed:    Comments:

## 2015-03-01 NOTE — Progress Notes (Signed)
Physical Therapy Treatment Patient Details Name: [facility] MRN: [id number] DOB: 04/08/1929 Today's Date: 03/01/2015    History of Present Illness 81 yo female with hx of stroke with left hemiplegia presenting with chest pain. History difficult to obtain. Per daughter pt "has her good days and her bad days", but her cognition is worse since stroke. Pt currently denies chest pain. She denied having any associated symptoms. EKG shows some nonspecific t wave changes which appear to be new; work up pending, patient under observation.    PT Comments    Pt requiring +2 today and after standing once, refused to stand a second time and performed a squat pivot transfer to recliner.  Con't to recommend SNF for rehab after d/c from acute care.  Follow Up Recommendations  SNF;Supervision/Assistance - 24 hour     Equipment Recommendations  None recommended by PT    Recommendations for Other Services       Precautions / Restrictions Precautions Precautions: Fall Restrictions Weight Bearing Restrictions: No    Mobility  Bed Mobility Overal bed mobility: Needs Assistance Bed Mobility: Supine to Sit     Supine to sit: +2 for physical assistance;Mod assist     General bed mobility comments: Pt able to bring legs towards EOB, but needed A for getting trunk upright.  Transfers Overall transfer level: Needs assistance Equipment used: 2 person hand held assist Transfers: Sit to/from [employer] Sit to Stand: Max assist;+2 physical assistance   Squat pivot transfers: +2 physical assistance;Max assist     General transfer comment: Stood with 2 HHA with a person on each side with L LE extending some forward and pt with increased anxiety and had to sit down.  Pt then refused to attempt to stand again.  Performed squat pivot transfer with recliner arm rest down with use of bed pads.  Ambulation/Gait             General Gait Details: unable to  attempt   Stairs            Wheelchair Mobility    Modified Rankin (Stroke Patients Only)       Balance     Sitting balance-Leahy Scale: Fair Sitting balance - Comments: able to sit EOB without physical assist   Standing balance support: Bilateral upper extremity supported Standing balance-Leahy Scale: Poor Standing balance comment: Needs L LE blocked                     Cognition Arousal/Alertness: Awake/alert Behavior During Therapy: Anxious Overall Cognitive Status: History of cognitive impairments - at baseline                      Exercises      General Comments General comments (skin integrity, edema, etc.): Pt distracted by thinking she is at home. Asking about what the kids are doing in the other room, etc. Pt anxious about PT staff working with her.      Pertinent Vitals/Pain      Home Living                      Prior Function            PT Goals (current goals can now be found in the care plan section) Acute Rehab PT Goals PT Goal Formulation: Patient unable to participate in goal setting Time For Goal Achievement: 03/13/15 Potential to Achieve Goals: Fair Progress towards PT goals: Progressing toward  goals    Frequency  Min 2X/week    PT Plan Current plan remains appropriate    Co-evaluation             End of Session Equipment Utilized During Treatment: Gait belt Activity Tolerance: Treatment limited secondary to agitation Patient left: in chair;with call bell/phone within reach;with chair alarm set     Time: 1211-1229 PT Time Calculation (min) (ACUTE ONLY): 18 min  Charges:  $Therapeutic Activity: 8-22 mins                    G Codes:      Kaiser Belluomini LUBECK 03/01/2015, 1:12 PM

## 2015-03-01 NOTE — Progress Notes (Signed)
[facility] PROGRESS NOTE  [facility] VXB:[id number] DOB: [birth year] DOA: 02/26/2015 PCP: Georgann Housekeeper, MD  Brief summary  Monica McMurrayis a 81 y.o. female, with a past medical history significant for dementia with behavioral disturbance, CVA with left-sided hemiparesis, diastolic dysfunction, chronic kidney disease stage II, indwelling Foley catheter placed for acute urinary retention several months ago which has not yet been removed, recurrent urinary tract infections, failure to thrive with a 20 pound weight loss in the last couple of months due to not eating, mitral and aortic stenosis, and hypertension who [facility] emergency department [facility] with chest pain.  The daughter reported that the patient developed chest pain this morning between 9 and 9:30 it lasted about 25 minutes, and resolved on its own. The patient reported that the pain was located in her sternal area and radiated in a band like fashion across her chest and felt like a heavy pressure.  She has had 2 falls in the last 2 weeks. Her left hand has been swollen and bruised and there was concern she tore her rotator cuff.  She was scheduled for an orthopedic surgery appointment early next week.  In the ER the patient was found to have a urinary tract infection. Her point-of-care troponin was 0.02. EKG demonstrated no acute changes of ST elevation or depression.    She was admitted for chest pain rule out and treatment of her catheter-associated urinary tract infection.  Her foley catheter was removed.  Chest pain work up was unremarkable.  Her dementia is considerable, however.  She has had problems with refusing to take medications and with agitation. She has had weight loss and eats minimally.  I have discussed hospice care and transitioning to full comfort care with the HPOA, [name].  She is struggling with transition of care.    Plan had been to discharge patient on 3/29, however,  patient started to have urinary retention and may need catheter replaced.  Would like to avoid replacement given her dementia (she pulled catheter out a few weeks ago) so monitoring to see if she actually needs it replaced or if we think she will be okay without.   Placed hospice consult and family okay with transitioning to medications for comfort at this time.     Assessment/Plan  Chest pain, now resolved. Not reproducible on exam. -  Tele:  NSR, now d/c'd -  Troponin:  Third elevated troponin at 0.04 but follow up was 0.03.   -  EKG stable -  PPI, GI Cocktail, ordered -  Continue previous 81 mg and plavix -  ECHO from 12/2014 EF 60%, moderate LVH, mild AS, significant mitral stenosis, moderate to severe LA dilation, RV moderately dilated, RA moderate to severe dilation  CAUTI due to chronic indwelling foley catheter, present at time of admission -  Foley removed -  F/u urine culture:  E. Coli, sensitivities pending -  Received 3 days of ceftriaxone and thought about transitioning to oral after she ripped out her IV, but since the catheter was removed and it is difficult to make her take medications will monitor for fever or other signs of infection for now and follow up the urine culture instead.  Acute on chronic urinary retention, having decreased uop and feels like she is starting to retain some urine on exam -  Continue flomax -  Voided when being transferred and PVR:  -  Repeat PVR in 4-6 hours, replace foley if >  retained urine  Progressive vascular dementia with agitation, weight loss and FTT -  Continue citalopram and ritalin which have been helpful -  Encourage oral ativan  -  Patient is high risk of readmission for recurrent infections, dehydration.  See below regarding GOC conversation  Left hand swelling, patient has had 2 falls in past week. Last one was Thursday. -  XR hand:  Osteoporosis, no obvious fracture -  Question of rotator cuff tear will be left  for outpatient work up  HTN, BP mildly elevated. patient refusing to take her medications secondary to her dementia -  Medications for comfort only  Diastolic dysfunction.  euvolemic. Stable.  Severe protein calorie malnutrition -  Regular diet with supplements -  Appreciate Nutrition assistance  Palliative Medicine, palliative medicine already following at SNF -  Discussed GOC with daughter and will meet again tomorrow -  Daughter is reluctant to allow hospice control over whether the patient gets hospitalized or not -  Reviewed MOST form:  DNR, LIMITED, + ABX, + IVF, -FEEDING TUBE.  Please review again prior to discharge as daughter/HPOA may want to readdress IVF and ABX  Diet:  Regular  Access:  none IVF:  off Proph:  lovenox  Code Status: DNR Family Communication: patient and her daughter who is HPOA Disposition Plan: pending f/u urine culture, monitoring for urinary retention.  Hospice can meet with them at facility too so could discharge Wednesday if voiding spontaneously but would review MOST form again to review IVF and antibiotics.   Consultants:  None  Procedures:  CXR  Left hand XR  Antibiotics:  Ceftriaxone 3/26 >> 3/28    HPI/Subjective:  Patient declining medications.  Agitated per nursing staff.  Pulled out IV.    Objective: Filed Vitals:   02/28/15 2117 03/01/15 0631 03/01/15 1122 03/01/15 1334  BP: 164/54 137/47 94/64 132/45  Pulse: 65 69 69 65  Temp: 98.3 F (36.8 C) 97.8 F (36.6 C) 97.8 F (36.6 C) 97.8 F (36.6 C)  TempSrc: Oral Oral Oral Oral  Resp: 18 18 18 18   Height:      Weight:  64.1 kg (141 lb 5 oz)    SpO2: 100% 99% 100% 100%    Intake/Output Summary (Last 24 hours) at 03/01/15 1852 Last data filed at 03/01/15 1519  Gross per 24 hour  Intake 1449.16 ml  Output    263 ml  Net 1186.16 ml   Filed Weights   02/27/15 0431 02/28/15 0530 03/01/15 0631  Weight: 62.8 kg (138 lb 7.2 oz) 61.896 kg (136 lb 7.3 oz) 64.1 kg (141  lb 5 oz)    Exam:   General:  Average weight female, No acute distress  HEENT:  NCAT, MMM  Cardiovascular:  RRR, nl S1, S2, 3/6 systolic murmur LSB and across chest, 2+ pulses, warm extremities  Respiratory:  CTAB, no increased WOB  Abdomen:   NABS, soft, NT/ND  MSK:   Normal tone and bulk, no LEE, LUE with bruising around thumb and 1+ pitting edema  Neuro:  Minimally able to lift left arm 3/5 strength, needed hoyer lift   Data Reviewed: Basic Metabolic Panel:  Recent Labs Lab 02/26/15 1214 02/28/15 0435  NA 135 135  K 4.4 3.2*  CL 104 105  CO2 22 21  GLUCOSE 99 94  BUN 9 8  CREATININE 0.66 0.63  CALCIUM 9.4 9.2   Liver Function Tests: No results for input(s): AST, ALT, ALKPHOS, BILITOT, PROT, ALBUMIN in the last 168 hours. No  results for input(s): LIPASE, AMYLASE in the last 168 hours. No results for input(s): AMMONIA in the last 168 hours. CBC:  Recent Labs Lab 02/26/15 1214 02/27/15 0100  WBC 13.0* 10.2  HGB 12.2 12.1  HCT 35.3* 35.2*  MCV 95.7 95.7  PLT 225 210   Cardiac Enzymes:  Recent Labs Lab 02/26/15 2016 02/26/15 2158 02/27/15 0100 02/27/15 0852  TROPONINI 0.03 0.03 0.04* <0.03   BNP (last 3 results)  Recent Labs  12/15/14 0645  BNP 1132.1*    ProBNP (last 3 results)  Recent Labs  04/08/14 0807 04/09/14 0909  PROBNP 5143.0* 16685.0*    CBG: No results for input(s): GLUCAP in the last 168 hours.  Recent Results (from the past 240 hour(s))  Urine culture     Status: None (Preliminary result)   Collection Time: 02/26/15  1:09 PM  Result Value Ref Range Status   Specimen Description URINE, RANDOM  Final   Special Requests CX ADDED AT 0500 ON [id number]  Final   Colony Count   Final    >=100,000 COLONIES/ML Performed at [facility]    Culture   Final    ESCHERICHIA COLI Performed at [facility]    Report Status PENDING  Incomplete  MRSA PCR Screening     Status: Abnormal   Collection Time: 02/26/15   9:06 PM  Result Value Ref Range Status   MRSA by PCR POSITIVE (A) NEGATIVE Final    Comment:        The GeneXpert MRSA Assay (FDA approved for NASAL specimens only), is one component of a comprehensive MRSA colonization surveillance program. It is not intended to diagnose MRSA infection nor to guide or monitor treatment for MRSA infections. RESULT CALLED TO, READ BACK BY AND VERIFIED WITH: FUTRELL,M RN [phone] AT 0050 SKEEN,P      Studies: No results found.  Scheduled Meds: . Chlorhexidine Gluconate Cloth  6 each Topical Q0600  . citalopram  20 mg Oral Daily  . feeding supplement (ENSURE ENLIVE)  237 mL Oral BID BM  . hydroxypropyl methylcellulose / hypromellose  1 drop Both Eyes QHS  . LORazepam  0.5 mg Intravenous Once  . Melatonin  3 mg Oral QHS  . methylphenidate  2.5 mg Oral Daily  . mupirocin ointment  1 application Nasal BID  . pantoprazole  40 mg Oral Daily  . polyethylene glycol  17 g Oral QHS  . sodium chloride  3 mL Intravenous Q12H  . tamsulosin  0.4 mg Oral QHS   Continuous Infusions:    Principal Problem:   Catheter-associated urinary tract infection Active Problems:   Mitral stenosis   HTN (hypertension)   Left-sided weakness   Chest pain   UTI (lower urinary tract infection)   Foley catheter in place on admission   Falls   Swelling of left hand   Atypical chest pain   Complicated UTI (urinary tract infection)   Essential hypertension   UTI (urinary tract infection)   Vascular dementia   Protein-calorie malnutrition, severe    Time spent: 30 min    Rosbel Buckner  [facility] Pager [phone]. If 7PM-7AM, please contact night-coverage at [email or web address], password [facility] 03/01/2015, 6:52 PM  LOS: 3 days

## 2015-03-01 NOTE — Progress Notes (Signed)
Patient has been confused, and not very cooperative today. Was able to get her to swallow her meds crushed in applesauce, but it took a Very long time for her to agree. Daughter was present at bedside as i gave her Am medications. Patient pulled IV out that was in place to right upper arm. Md M. Short notified, and orders were given to leave IV out.

## 2015-03-01 NOTE — Progress Notes (Signed)
ANTICOAGULATION CONSULT NOTE - Initial Consult  Pharmacy Consult for Lovenox Indication: VTE prophylaxis  Allergies  Allergen Reactions  . Fentanyl Other (See Comments)    confusion    Patient Measurements: Height: 5\' 6"  (167.6 cm) Weight: 141 lb 5 oz (64.1 kg) (bedscale) IBW/kg (Calculated) : 59.3  Vital Signs: Temp: 97.8 F (36.6 C) (03/29 1334) Temp Source: Oral (03/29 1334) BP: 132/45 mmHg (03/29 1334) Pulse Rate: 65 (03/29 1334)  Labs:  Recent Labs  02/26/15 2158 02/27/15 0100 02/27/15 0852 02/28/15 0435  HGB  --  12.1  --   --   HCT  --  35.2*  --   --   PLT  --  210  --   --   CREATININE  --   --   --  0.63  TROPONINI 0.03 0.04* <0.03  --     Estimated Creatinine Clearance: 49.9 mL/min (by C-G formula based on Cr of 0.63).   Medical History: Past Medical History  Diagnosis Date  . Allergy   . Anxiety   . Insomnia   . Osteoarthritis   . Anemia   . Obesity   . Diastolic dysfunction   . CKD (chronic kidney disease)   . Mitral stenosis     with moderate MR on echo 4/14  . Aortic stenosis     mild by echo 4/14  . Aortic regurgitation     mild to moderate by echo 4/14  . HTN (hypertension)   . Stroke   . Vascular dementia     Medications:  Prescriptions prior to admission  Medication Sig Dispense Refill Last Dose  . albuterol (PROVENTIL HFA;VENTOLIN HFA) 108 (90 BASE) MCG/ACT inhaler Inhale 2 puffs into the lungs every 6 (six) hours as needed for shortness of breath.   unk at 5/14  . aspirin 81 MG chewable tablet Chew 4 tablets (324 mg total) by mouth daily.   02/26/2015 at Unknown time  . bisacodyl (DULCOLAX) 5 MG EC tablet Take 5 mg by mouth daily as needed for severe constipation.    02/25/2015 at Unknown time  . carboxymethylcellulose (REFRESH) 1 % ophthalmic solution Place 1 drop into both eyes 3 (three) times daily.    02/26/2015 at Unknown time  . cholecalciferol (VITAMIN D) 1000 UNITS tablet Take 1,000 Units by mouth daily.   02/26/2015 at  Unknown time  . citalopram (CELEXA) 20 MG tablet Take 20 mg by mouth daily.   02/26/2015 at Unknown time  . clopidogrel (PLAVIX) 75 MG tablet Take 1 tablet (75 mg total) by mouth daily. 30 tablet 0 02/26/2015 at Unknown time  . diclofenac sodium (VOLTAREN) 1 % GEL Apply 2 g topically 3 (three) times daily as needed (pain).    02/26/2015 at Unknown time  . esomeprazole (NEXIUM) 20 MG capsule Take 20 mg by mouth daily at 12 noon.   02/25/2015 at Unknown time  . ferrous sulfate 325 (65 FE) MG tablet Take 325 mg by mouth daily with breakfast.   02/26/2015 at Unknown time  . folic acid (FOLVITE) 1 MG tablet Take 1 mg by mouth daily.   02/26/2015 at Unknown time  . LORazepam (ATIVAN) 0.5 MG tablet Take 0.5 mg by mouth every 6 (six) hours as needed for anxiety.   02/25/2015 at Unknown time  . Melatonin 5 MG CAPS Take 5 mg by mouth at bedtime.   02/25/2015 at Unknown time  . methylphenidate (RITALIN) 5 MG tablet Take 2.5 mg by mouth daily.   02/26/2015 at Unknown  time  . polyethylene glycol (MIRALAX / GLYCOLAX) packet Take 17 g by mouth at bedtime.   02/25/2015 at Unknown time  . tamsulosin (FLOMAX) 0.4 MG CAPS capsule Take 1 capsule (0.4 mg total) by mouth daily. (Patient taking differently: Take 0.4 mg by mouth at bedtime. ) 30 capsule 0 02/25/2015 at Unknown time  . verapamil (CALAN-SR) 120 MG CR tablet Take 1 tablet (120 mg total) by mouth daily. 30 tablet 3 02/26/2015 at Unknown time    Assessment: 81 y.o. female presented 3/26 from [facility] facility with CP. To begin Lovenox for VTE prophylaxis. SCr 0.63, est CrCl 50 ml/min. CBC stable.  Goal of Therapy:  VTE prevention Monitor platelets by anticoagulation protocol: Yes   Plan:  1. Lovenox 40mg  SQ q24h 2. Pharmacy will sign off - please reconsult if needed  , PharmD, BCPS Clinical pharmacist, pager [phone] 03/01/2015,7:01 PM

## 2015-03-01 NOTE — Progress Notes (Signed)
Bladder scan was ordered to use on patient for urine residual. PT had got patient up to the recliner. Used hoyer to get patient back up in bed. Patient had urinated a large amount while sitting up in the chair, and urinated more after placing her in the bed.  Patient had become agitated and combative, and refused to stand for [place]. That is the reason why we used the [facility] to put patient back to bed. Cream has been ordered for patients back due to her complaining of itching.

## 2015-03-01 NOTE — Psychosocial Assessment (Signed)
Clinical Social Work Department BRIEF PSYCHOSOCIAL ASSESSMENT 03/01/2015  Patient:  Monica Blankenship, Monica Blankenship     Account Number:  [id number]     Admit date:  02/26/2015  Clinical Social Worker:  Ziza Hastings, CLINICAL SOCIAL WORKER  Date/Time:  03/01/2015 10:52 AM  Referred by:  Physician  Date Referred:  03/01/2015 Referred for  Psychosocial assessment   Other Referral:   SNF placement   Interview type:  Family Other interview type:   BSW intern talked with [name] regarding discharge needs.  [id number] [name] is pts POA    PSYCHOSOCIAL DATA Living Status:  FACILITY Admitted from facility:  [facility] Level of care:  Skilled Nursing Facility Primary support name:  [name] Primary support relationship to patient:  CHILD, ADULT Degree of support available:   [name] is POA and is very supportive. At bedside in room today.    CURRENT CONCERNS Current Concerns  Post-Acute Placement   Other Concerns:    SOCIAL WORK ASSESSMENT / PLAN BSW intern talked with [name] pt's daughter. Pt is from [employer]. Pt has been at [facility] since December 2015. POA would like for Sw to send out bed offers to other facilities within [place] to see what other SNF offers she has.   Assessment/plan status:  Psychosocial Support/Ongoing Assessment of Needs Other assessment/ plan:   Information/referral to community resources:   Chris-daughter recieved SNF list.  Clovis Cao will be completed and faxed out to [facility] today.  SW will follow up with ddughter tomorrow on bed offers.    PATIENT'S/FAMILY'S RESPONSE TO PLAN OF CARE: Family is very supportive and at bedside today. [name] is POA. Pt is confused.

## 2015-03-02 DIAGNOSIS — R630 Anorexia: Secondary | ICD-10-CM

## 2015-03-02 DIAGNOSIS — Z515 Encounter for palliative care: Secondary | ICD-10-CM

## 2015-03-02 DIAGNOSIS — R451 Restlessness and agitation: Secondary | ICD-10-CM

## 2015-03-02 DIAGNOSIS — Z7189 Other specified counseling: Secondary | ICD-10-CM

## 2015-03-02 DIAGNOSIS — R531 Weakness: Secondary | ICD-10-CM

## 2015-03-02 LAB — URINE CULTURE: Colony Count: >=100000

## 2015-03-02 LAB — CLOSTRIDIUM DIFFICILE BY PCR: Toxigenic C. Difficile by PCR: NEGATIVE

## 2015-03-02 MED ORDER — FERROUS SULFATE 325 (65 FE) MG PO TABS
325.0000 mg | ORAL_TABLET | Freq: Every day | ORAL | Status: DC
  Administered 2015-03-03: 325 mg via ORAL
  Filled 2015-03-02 (×2): qty 1

## 2015-03-02 MED ORDER — FOLIC ACID 1 MG PO TABS
1.0000 mg | ORAL_TABLET | Freq: Every day | ORAL | Status: DC
  Administered 2015-03-02 – 2015-03-03 (×2): 1 mg via ORAL
  Filled 2015-03-02 (×2): qty 1

## 2015-03-02 MED ORDER — BISACODYL 5 MG PO TBEC: 5.0000 mg | DELAYED_RELEASE_TABLET | Freq: Every day | ORAL | Status: DC | PRN

## 2015-03-02 MED ORDER — SULFAMETHOXAZOLE-TRIMETHOPRIM 800-160 MG PO TABS
1.0000 | ORAL_TABLET | Freq: Two times a day (BID) | ORAL | Status: DC
  Administered 2015-03-02 – 2015-03-03 (×3): 1 via ORAL
  Filled 2015-03-02 (×4): qty 1

## 2015-03-02 MED ORDER — CLOPIDOGREL BISULFATE 75 MG PO TABS
75.0000 mg | ORAL_TABLET | Freq: Every day | ORAL | Status: DC
  Administered 2015-03-02 – 2015-03-03 (×2): 75 mg via ORAL
  Filled 2015-03-02 (×2): qty 1

## 2015-03-02 MED ORDER — TAMSULOSIN HCL 0.4 MG PO CAPS
0.4000 mg | ORAL_CAPSULE | Freq: Every day | ORAL | Status: DC
  Administered 2015-03-02: 0.4 mg via ORAL
  Filled 2015-03-02 (×2): qty 1

## 2015-03-02 NOTE — Consult Note (Signed)
Consultation Note Date: 03/02/2015   Patient Name: Monica Blankenship  DOB: 09-25-1929  MRN: [id number]  Age / Sex: 81 y.o., female   PCP: Georgann Housekeeper, MD Referring Physician: Marinda Elk, MD  Reason for Consultation: Non pain symptom management, hospice  HPI: 81 y.o. female, with a past medical history significant for dementia with behavioral disturbance, CVA with left-sided hemiparesis, diastolic dysfunction, chronic kidney disease stage II, indwelling Foley catheter placed for acute urinary retention several months ago which has not yet been removed, recurrent urinary tract infections, failure to thrive with a 20 pound weight loss in the last couple of months due to not eating, mitral and aortic stenosis, and hypertension who to the emergency department from [facility] with chest pain. The daughter reported that the patient developed chest pain this morning between 9 and 9:30 it lasted about 25 minutes, and resolved on its own. The patient reported that the pain was located in her sternal area and radiated in a band like fashion across her chest and felt like a heavy pressure. She has had 2 falls in the last 2 weeks. Her left hand has been swollen and bruised and there was concern she tore her rotator cuff. She was scheduled for an orthopedic surgery appointment early next week. In the ER the patient was found to have a urinary tract infection. Her point-of-care troponin was 0.02. EKG demonstrated no acute changes of ST elevation or depression.She was admitted for chest pain rule out and treatment of her catheter-associated urinary tract infection. Her foley catheter was removed. Chest pain work up was unremarkable. Her dementia is considerable, however. She has had problems with refusing to take medications and with agitation. She has had weight loss and eats minimally. I have discussed hospice care and transitioning to full comfort care with the HPOA, Ms.  Shelton. She is struggling with transition of care.Plan had been to discharge patient on 3/29, however, patient started to have urinary retention and may need catheter replaced. Would like to avoid replacement given her dementia (she pulled catheter out a few weeks ago) so monitoring to see if she actually needs it replaced or if we think she will be okay without. Placed hospice consult and family okay with transitioning to medications for comfort at this time.PMH reviewed.   Palliative Review of Systems: Dyspnea: no Cough: no Nutritional status( weight change, appetite, taste disturbance): yes Oral Symptoms (xerostomia, dysphagia, odynophagia): no Nausea/Vomiting: no Constipation/diarrhea: no  Urination problems: yes Sleep: yes Fatigue: no Sedation: no Cognitive/ memory problems: yes Anxiety: yes Depression: no - not that I can tell with dementia Other: no  Contacts/Participants in Discussion: Primary Decision Maker:  [name]  HCPOA: yes  Also with HCPOA listed as Delfin Gant (primary) but not with [name] as well.   Advance Directive: Received   I have reviewed the medical record, interviewed the patient and family, and examined the patient. The following aspects are pertinent.  Past Medical History  Diagnosis Date  . Allergy   . Anxiety   . Insomnia   . Osteoarthritis   . Anemia   . Obesity   . Diastolic dysfunction   . CKD (chronic kidney disease)   . Mitral stenosis     with moderate MR on echo 4/14  . Aortic stenosis     mild by echo 4/14  . Aortic regurgitation     mild to moderate by echo 4/14  . HTN (hypertension)   . Stroke   .  Vascular dementia    History   Social History  . Marital Status: Married    Spouse Name: N/A  . Number of Children: N/A  . Years of Education: N/A   Occupational History  . retired    Social History Main Topics  . Smoking status: Former Smoker -- 0.25 packs/day for 1 years  . Smokeless tobacco:  Never Used     Comment: smoked 1-3 cigs/day in school for about a year some days only  . Alcohol Use: No  . Drug Use: No  . Sexual Activity: Not on file   Other Topics Concern  . None   Social History Narrative   Family History  Problem Relation Age of Onset  . Hypertension Mother   . CVA Mother    Scheduled Meds: . Chlorhexidine Gluconate Cloth  6 each Topical Q0600  . citalopram  20 mg Oral Daily  . enoxaparin (LOVENOX) injection  40 mg Subcutaneous Q24H  . hydroxypropyl methylcellulose / hypromellose  1 drop Both Eyes QHS  . LORazepam  0.5 mg Intravenous Once  . methylphenidate  2.5 mg Oral Daily  . mupirocin ointment  1 application Nasal BID  . polyethylene glycol  17 g Oral QHS  . tamsulosin  0.4 mg Oral QHS   Continuous Infusions:  PRN Meds:.acetaminophen, camphor-menthol, diclofenac sodium, gi cocktail, HYDROcodone-acetaminophen, LORazepam, ondansetron (ZOFRAN) IV Medications Prior to Admission:  Prior to Admission medications   Medication Sig Start Date End Date Taking? Authorizing Provider  albuterol (PROVENTIL HFA;VENTOLIN HFA) 108 (90 BASE) MCG/ACT inhaler Inhale 2 puffs into the lungs every 6 (six) hours as needed for shortness of breath.   Yes Historical Provider, MD  aspirin 81 MG chewable tablet Chew 4 tablets (324 mg total) by mouth daily. 11/17/14  Yes Rhetta Mura, MD  bisacodyl (DULCOLAX) 5 MG EC tablet Take 5 mg by mouth daily as needed for severe constipation.    Yes Historical Provider, MD  carboxymethylcellulose (REFRESH) 1 % ophthalmic solution Place 1 drop into both eyes 3 (three) times daily.    Yes Historical Provider, MD  cholecalciferol (VITAMIN D) 1000 UNITS tablet Take 1,000 Units by mouth daily.   Yes Historical Provider, MD  citalopram (CELEXA) 20 MG tablet Take 20 mg by mouth daily.   Yes Historical Provider, MD  clopidogrel (PLAVIX) 75 MG tablet Take 1 tablet (75 mg total) by mouth daily. 11/17/14  Yes Rhetta Mura, MD    diclofenac sodium (VOLTAREN) 1 % GEL Apply 2 g topically 3 (three) times daily as needed (pain).    Yes Historical Provider, MD  esomeprazole (NEXIUM) 20 MG capsule Take 20 mg by mouth daily at 12 noon.   Yes Historical Provider, MD  ferrous sulfate 325 (65 FE) MG tablet Take 325 mg by mouth daily with breakfast.   Yes Historical Provider, MD  folic acid (FOLVITE) 1 MG tablet Take 1 mg by mouth daily.   Yes Historical Provider, MD  LORazepam (ATIVAN) 0.5 MG tablet Take 0.5 mg by mouth every 6 (six) hours as needed for anxiety.   Yes Historical Provider, MD  Melatonin 5 MG CAPS Take 5 mg by mouth at bedtime.   Yes Historical Provider, MD  methylphenidate (RITALIN) 5 MG tablet Take 2.5 mg by mouth daily.   Yes Historical Provider, MD  polyethylene glycol (MIRALAX / GLYCOLAX) packet Take 17 g by mouth at bedtime.   Yes Historical Provider, MD  tamsulosin (FLOMAX) 0.4 MG CAPS capsule Take 1 capsule (0.4 mg total) by mouth  daily. Patient taking differently: Take 0.4 mg by mouth at bedtime.  12/29/14  Yes Maryann Mikhail, DO  verapamil (CALAN-SR) 120 MG CR tablet Take 1 tablet (120 mg total) by mouth daily. 05/24/14  Yes Georgann Housekeeper, MD   Allergies  Allergen Reactions  . Fentanyl Other (See Comments)    confusion   CBC:    Component Value Date/Time   WBC 10.2 02/27/2015 0100   WBC 8.3 01/17/2014 1044   HGB 12.1 02/27/2015 0100   HGB 10.8* 01/17/2014 1044   HCT 35.2* 02/27/2015 0100   HCT 35.2* 01/17/2014 1044   PLT 210 02/27/2015 0100   MCV 95.7 02/27/2015 0100   MCV 105.2* 01/17/2014 1044   NEUTROABS 10.2* 12/28/2014 1253   LYMPHSABS 1.2 12/28/2014 1253   MONOABS 0.6 12/28/2014 1253   EOSABS 0.0 12/28/2014 1253   BASOSABS 0.0 12/28/2014 1253   Comprehensive Metabolic Panel:    Component Value Date/Time   NA 135 02/28/2015 0435   K 3.2* 02/28/2015 0435   CL 105 02/28/2015 0435   CO2 21 02/28/2015 0435   BUN 8 02/28/2015 0435   CREATININE 0.63 02/28/2015 0435   CREATININE 1.14*  09/28/2013 1230   GLUCOSE 94 02/28/2015 0435   CALCIUM 9.2 02/28/2015 0435   AST 40* 12/28/2014 1253   ALT 29 12/28/2014 1253   ALKPHOS 77 12/28/2014 1253   BILITOT 1.2 12/28/2014 1253   PROT 8.2 12/28/2014 1253   ALBUMIN 4.0 12/28/2014 1253    Physical Exam: Vital Signs: BP 145/86 mmHg  Pulse 70  Temp(Src) 97.4 F (36.3 C) (Axillary)  Resp 16  Ht 5\' 6"  (1.676 m)  Wt 62.5 kg (137 lb 12.6 oz)  BMI 22.25 kg/m2  SpO2 100% SpO2: SpO2:  (pt refused RN NOTIFY) O2 Device: O2 Device: Not Delivered O2 Flow Rate:    Intake/output summary:  Intake/Output Summary (Last 24 hours) at 03/02/15 1142 Last data filed at 03/02/15 0900  Gross per 24 hour  Intake    320 ml  Output    452 ml  Net   -132 ml    LBM: 3/29  Baseline Weight: Weight: 63.3 kg (139 lb 8.8 oz) Most recent weight: Weight: 62.5 kg (137 lb 12.6 oz)  Physical Exam:             General Appearance:alert and no distress             Heart: normal rate, regular rhythm, normal S1, S2, no murmurs, rubs, clicks or gallops             Respiratory: clear to auscultation bilaterally             Abdomen: soft, nontender, nondistended, no masses or organomegaly.             Extremities: MAE, no edema, weakness left side r/t h/o stroke, left hand contracted  Neuro: Awake, alert, oriented to person only and able to give some history (tells me about past stroke)   Prior to Admission medications   Medication Sig Start Date End Date Taking? Authorizing Provider  albuterol (PROVENTIL HFA;VENTOLIN HFA) 108 (90 BASE) MCG/ACT inhaler Inhale 2 puffs into the lungs every 6 (six) hours as needed for shortness of breath.   Yes Historical Provider, MD  aspirin 81 MG chewable tablet Chew 4 tablets (324 mg total) by mouth daily. 11/17/14  Yes 11/19/14, MD  bisacodyl (DULCOLAX) 5 MG EC tablet Take 5 mg by mouth daily as needed for severe constipation.    Yes  Historical Provider, MD  carboxymethylcellulose (REFRESH) 1 % ophthalmic  solution Place 1 drop into both eyes 3 (three) times daily.    Yes Historical Provider, MD  cholecalciferol (VITAMIN D) 1000 UNITS tablet Take 1,000 Units by mouth daily.   Yes Historical Provider, MD  citalopram (CELEXA) 20 MG tablet Take 20 mg by mouth daily.   Yes Historical Provider, MD  clopidogrel (PLAVIX) 75 MG tablet Take 1 tablet (75 mg total) by mouth daily. 11/17/14  Yes Rhetta Mura, MD  diclofenac sodium (VOLTAREN) 1 % GEL Apply 2 g topically 3 (three) times daily as needed (pain).    Yes Historical Provider, MD  esomeprazole (NEXIUM) 20 MG capsule Take 20 mg by mouth daily at 12 noon.   Yes Historical Provider, MD  ferrous sulfate 325 (65 FE) MG tablet Take 325 mg by mouth daily with breakfast.   Yes Historical Provider, MD  folic acid (FOLVITE) 1 MG tablet Take 1 mg by mouth daily.   Yes Historical Provider, MD  LORazepam (ATIVAN) 0.5 MG tablet Take 0.5 mg by mouth every 6 (six) hours as needed for anxiety.   Yes Historical Provider, MD  Melatonin 5 MG CAPS Take 5 mg by mouth at bedtime.   Yes Historical Provider, MD  methylphenidate (RITALIN) 5 MG tablet Take 2.5 mg by mouth daily.   Yes Historical Provider, MD  polyethylene glycol (MIRALAX / GLYCOLAX) packet Take 17 g by mouth at bedtime.   Yes Historical Provider, MD  tamsulosin (FLOMAX) 0.4 MG CAPS capsule Take 1 capsule (0.4 mg total) by mouth daily. Patient taking differently: Take 0.4 mg by mouth at bedtime.  12/29/14  Yes Maryann Mikhail, DO  verapamil (CALAN-SR) 120 MG CR tablet Take 1 tablet (120 mg total) by mouth daily. 05/24/14  Yes Georgann Housekeeper, MD                          Additional Data Reviewed: Recent Labs     02/28/15  0435  NA  135  BUN  8  CREATININE  0.63      Palliative Care Assessment and Plan Summary of Established Goals of Care and Medical Treatment Preferences    Primary Diagnoses  Present on Admission:  . Chest pain . UTI (lower urinary tract infection) . HTN (hypertension) .  Left-sided weakness . Mitral stenosis . Falls . Swelling of left hand . UTI (urinary tract infection)  Prognosis: < 6 months  Psycho-social/Spiritual:   Support provided to patient and to family. Family has insight into disease progression and prognosis although patient does not.   Hospitalizations in the last 6 months: 4  Palliative Performance Scale: 20%  Recommendations:  1. Code Status: DNR  2. Symptom Management:   Decreased appetite: Encourage foods that she likes in small frequent meals providing her as much independence and choice over meals as possible. Consider feeding supplements if she will accept (may offer as milkshake to make more appetizing). This is s/t dementia and likely not going to improve.   Weakness: s/t dementia. Left sided weakness chronic d/t h/o stroke. Unsure her baseline but would be surprised if she were not mostly bed or wheelchair bound.   Agitation: I am not seeing any agitation currently. She is mainly uncooperative in taking medications/eating. Difficult to manage if she refuses her medications - per daughter this is not a new problem for her. Could consider prn liquid solution (if this will be better than pill form?) of haldol  1 mg as trial. Palliative services via [facility] have been making recommendations for this at [facility].   3. GOC: Spoke with [name] over phione and husband, Dennard Nip, and a son in room. They agree to return to [facility] with hospice support. They want her comfort and happiness as a priority and are aware that certain interventions may cause more harm than benefit (there was an order for PICC but this was decided with Dennard Nip to be very risky). Dennard Nip says that he would leave the rest up to God for what the future holds for her. He would like for her to get stronger and come home but understands this is unlikely.   4. Palliative Prophylaxis:  Bowel Regimen: Recommend    5. Disposition: Skilled Nursing Facility with  Hospice confirmed with daughter, [name], and husband, Dennard Nip.    Time In: 1140 Time Out: 1300 Time Total: Greater than 50%  of this time was spent counseling and coordinating care related to the above assessment and plan.  Signed by:  Ulice Bold, NP  03/02/2015, 11:42 AM  Please contact Palliative Medicine Team phone at [phone] for questions and concerns.

## 2015-03-02 NOTE — Progress Notes (Signed)
Pt refusing morning medications. Bed in lowest position, call bell within reach. Will continue to monitor closely.

## 2015-03-02 NOTE — Progress Notes (Signed)
[facility] PROGRESS NOTE Interim History: Monica Blankenship a 81 y.o. female, with a past medical history significant for dementia with behavioral disturbance, CVA with left-sided hemiparesis, diastolic dysfunction, chronic kidney disease stage II, indwelling Foley catheter placed for acute urinary retention several months ago which has not yet been removed, recurrent urinary tract infections, failure to thrive with a 20 pound weight loss in the last couple of months due to not eating, mitral and aortic stenosis, and hypertension who to the emergency department [facility] with chest pain. The daughter reported that the patient developed chest pain this morning between 9 and 9:30 it lasted about 25 minutes, and resolved on its own. The patient reported that the pain was located in her sternal area and radiated in a band like fashion across her chest and felt like a heavy pressure. She has had 2 falls in the last 2 weeks. Her left hand has been swollen and bruised and there was concern she tore her rotator cuff. She was scheduled for an orthopedic surgery appointment early next week. In the ER the patient was found to have a urinary tract infection. Her point-of-care troponin was 0.02. EKG demonstrated no acute changes of ST elevation or depression.   She was admitted for chest pain rule out and treatment of her catheter-associated urinary tract infection. Her foley catheter was removed. Chest pain work up was unremarkable. Her dementia is considerable, however. She has had problems with refusing to take medications and with agitation. She has had weight loss and eats minimally. I have discussed hospice care and transitioning to full comfort care with the HPOA, Monica Blankenship. She is struggling with transition of care.   Plan had been to discharge patient on 3/29, however, patient started to have urinary retention and may need catheter replaced. Would like  to avoid replacement given her dementia (she pulled catheter out a few weeks ago) so monitoring to see if she actually needs it replaced or if we think she will be okay without. Placed hospice consult and family okay with transitioning to medications for comfort at this time.     Assessment/Plan: Atypical chest pain: No resolved. Normal sinus rhythm on telemetry, troponins were just barely elevated at 0.04 the follow-up was trending down to 0.03. EKG shows no changes. Continue aspirin and Plavix.  Chronic indwelling catheter presenting with a urinary tract infection at the time of admission: Fully removed, urine culture showed Escherichia coli pansensitive, will start Bactrim.  She will need at least a 7 day course of antibiotics.  Acute on chronic urinary retention: She's having decreased urine output, will get a bladder scan, continue Flomax.  Progressive vascular dementia: Continue citalopram. Patient high risk of recurrent infection.  Left hand swelling: X-ray of the head showed no fractures, question rotator cuff tear will need further workup as an outpatient.  Essential hypertension: Effusions take her medications secondary to her dementia. So we'll continue to monitor.  Chronic diastolic heart failure: Seems euvolemic.  Severe protein: Malnutrition.  Palliative Care was consulted and following its native, to discuss goals of care as an outpatient. Daughter is reluctant to allow hospice control over the patient when she she is hospitalized.   Code Status: Limited DO NOT RESUSCITATE Family Communication: Daughter  Disposition Plan: Inpatient   Consultants:  Palliative  Procedures:  CXR  Antibiotics:  bactrim  HPI/Subjective: No complains  Objective: Filed Vitals:   03/01/15 2052 03/02/15 0539 03/02/15 0900 03/02/15 1054  BP: 117/64  184/84 145/86  Pulse: 74 90 77 70  Temp: 99 F (37.2 C) 98.7 F (37.1 C) 97.4 F (36.3 C)   TempSrc: Oral Oral  Axillary   Resp: 18 18 16    Height:      Weight:  62.5 kg (137 lb 12.6 oz)    SpO2: 100% 100%      Intake/Output Summary (Last 24 hours) at 03/02/15 1212 Last data filed at 03/02/15 0900  Gross per 24 hour  Intake    320 ml  Output    452 ml  Net   -132 ml   Filed Weights   02/28/15 0530 03/01/15 0631 03/02/15 0539  Weight: 61.896 kg (136 lb 7.3 oz) 64.1 kg (141 lb 5 oz) 62.5 kg (137 lb 12.6 oz)    Exam:  General: in no acute distress.  HEENT: No bruits, no goiter.  Heart: Regular rate and rhythm, without murmurs, rubs, gallops.  Lungs: Good air movement, bilateral air movement.  Abdomen: Soft, nontender, nondistended, positive bowel sounds.  Neuro: Grossly intact, nonfocal.   Data Reviewed: Basic Metabolic Panel:  Recent Labs Lab 02/26/15 1214 02/28/15 0435  NA 135 135  K 4.4 3.2*  CL 104 105  CO2 22 21  GLUCOSE 99 94  BUN 9 8  CREATININE 0.66 0.63  CALCIUM 9.4 9.2   Liver Function Tests: No results for input(s): AST, ALT, ALKPHOS, BILITOT, PROT, ALBUMIN in the last 168 hours. No results for input(s): LIPASE, AMYLASE in the last 168 hours. No results for input(s): AMMONIA in the last 168 hours. CBC:  Recent Labs Lab 02/26/15 1214 02/27/15 0100  WBC 13.0* 10.2  HGB 12.2 12.1  HCT 35.3* 35.2*  MCV 95.7 95.7  PLT 225 210   Cardiac Enzymes:  Recent Labs Lab 02/26/15 2016 02/26/15 2158 02/27/15 0100 02/27/15 0852  TROPONINI 0.03 0.03 0.04* <0.03   BNP (last 3 results)  Recent Labs  12/15/14 0645  BNP 1132.1*    ProBNP (last 3 results)  Recent Labs  04/08/14 0807 04/09/14 0909  PROBNP 5143.0* 16685.0*    CBG: No results for input(s): GLUCAP in the last 168 hours.  Recent Results (from the past 240 hour(s))  Urine culture     Status: None   Collection Time: 02/26/15  1:09 PM  Result Value Ref Range Status   Specimen Description URINE, RANDOM  Final   Special Requests CX ADDED AT 0500 ON 02/28/15  Final   Colony Count   Final     >=100,000 COLONIES/ML Performed at [id number]    Culture   Final    ESCHERICHIA COLI Performed at [facility]    Report Status 03/02/2015 FINAL  Final   Organism ID, Bacteria ESCHERICHIA COLI  Final      Susceptibility   Escherichia coli - MIC*    AMPICILLIN 4 SENSITIVE Sensitive     CEFAZOLIN <=4 SENSITIVE Sensitive     CEFTRIAXONE <=1 SENSITIVE Sensitive     CIPROFLOXACIN <=0.25 SENSITIVE Sensitive     GENTAMICIN <=1 SENSITIVE Sensitive     LEVOFLOXACIN <=0.12 SENSITIVE Sensitive     NITROFURANTOIN <=16 SENSITIVE Sensitive     TOBRAMYCIN <=1 SENSITIVE Sensitive     TRIMETH/SULFA <=20 SENSITIVE Sensitive     PIP/TAZO <=4 SENSITIVE Sensitive     * ESCHERICHIA COLI  MRSA PCR Screening     Status: Abnormal   Collection Time: 02/26/15  9:06 PM  Result Value Ref Range Status   MRSA by PCR POSITIVE (A)  NEGATIVE Final    Comment:        The GeneXpert MRSA Assay (FDA approved for NASAL specimens only), is one component of a comprehensive MRSA colonization surveillance program. It is not intended to diagnose MRSA infection nor to guide or monitor treatment for MRSA infections. RESULT CALLED TO, READ BACK BY AND VERIFIED WITH: FUTRELL,M RN [phone] AT 0050 SKEEN,P   Clostridium Difficile by PCR     Status: None   Collection Time: 03/02/15  5:41 AM  Result Value Ref Range Status   C difficile by pcr NEGATIVE NEGATIVE Final     Studies: No results found.  Scheduled Meds: . Chlorhexidine Gluconate Cloth  6 each Topical Q0600  . citalopram  20 mg Oral Daily  . enoxaparin (LOVENOX) injection  40 mg Subcutaneous Q24H  . hydroxypropyl methylcellulose / hypromellose  1 drop Both Eyes QHS  . LORazepam  0.5 mg Intravenous Once  . methylphenidate  2.5 mg Oral Daily  . mupirocin ointment  1 application Nasal BID  . polyethylene glycol  17 g Oral QHS  . tamsulosin  0.4 mg Oral QHS   Continuous Infusions:    Marinda Elk  [facility] Pager  [phone]. If 7PM-7AM, please contact night-coverage at [email or web address], password [facility] 03/02/2015, 12:12 PM  LOS: 4 days

## 2015-03-02 NOTE — Clinical Social Work Placement (Addendum)
    Clinical Social Work Department CLINICAL SOCIAL WORK PLACEMENT NOTE 03/02/2015  Patient:  Monica Blankenship, Monica Blankenship  Account Number:  [id number] Admit date:  02/26/2015  Clinical Social Worker:  Sharol Harness, Theresia Majors  Date/time:  03/01/2015 03:00 PM  Clinical Social Work is seeking post-discharge placement for this patient at the following level of care:   SKILLED NURSING   (*CSW will update this form in Epic as items are completed)   03/01/2015  Patient/family provided with [facility] Department of Clinical Social Work's list of facilities offering this level of care within the geographic area requested by the patient (or if unable, by the patient's family).  03/01/2015  Patient/family informed of their freedom to choose among providers that offer the needed level of care, that participate in Medicare, Medicaid or managed care program needed by the patient, have an available bed and are willing to accept the patient.  03/01/2015  Patient/family informed of [facility]' ownership interest in [facility], as well as of the fact that they are under no obligation to receive care at this facility.  PASARR submitted to EDS on existing PASARR number received on   FL2 transmitted to all facilities in geographic area requested by pt/family on  03/01/2015 FL2 transmitted to all facilities within larger geographic area on   Patient informed that his/her managed care company has contracts with or will negotiate with  certain facilities, including the following:     Patient/family informed of bed offers received:   Patient chooses bed at  Physician recommends and patient chooses bed at    [facility] to [facility] on 03/03/15 Patient to be transferred to facility by Ambulance Sharin Mons)  Patient and family notified of transfer on 03/03/15 Name of family member notified:  Mart Piggs  The following physician request were entered in Epic: Physician Request  Please sign  FL2.    Additional CommentsHarless Nakayama [phone]  03/03/15 OK per MD for d/c today back to [facility]hab for continued stay. CSW spoke to [place] at the SNF- she stated that patient is a long term care resident there and that she is covered by [facility] services.  She had palliative care services in the past and now qualifies for Hospice Care. This service will be arranged by the facility. DC arrangements completed by Marcy Siren, BSW Intern.  Nursing notified to call report.  Patient noted to be alert but confused.  Family was agreeable to return to facility.  No further CSW needs identified. CSW signing off.  Lorri Frederick. Jaci Lazier, [place] [phone]

## 2015-03-03 MED ORDER — LORAZEPAM 0.5 MG PO TABS: 0.5000 mg | ORAL_TABLET | Freq: Four times a day (QID) | ORAL | Status: DC | PRN

## 2015-03-03 MED ORDER — SULFAMETHOXAZOLE-TRIMETHOPRIM 800-160 MG PO TABS: 1.0000 | ORAL_TABLET | Freq: Two times a day (BID) | ORAL | Status: DC

## 2015-03-03 NOTE — Discharge Summary (Signed)
Physician Discharge Summary  Monica Blankenship:[id number] DOB: [birth year] DOA: 02/26/2015  PCP: Georgann Housekeeper, MD  Admit date: 02/26/2015 Discharge date: 03/03/2015  Time spent: 30 minutes  Recommendations for Outpatient Follow-up:  1. Follow up PCP 2. hospice and palliative are to follow at facility  Discharge Diagnoses:  Principal Problem:   Catheter-associated urinary tract infection Active Problems:   Mitral stenosis   HTN (hypertension)   Left-sided weakness   Chest pain   UTI (lower urinary tract infection)   Foley catheter in place on admission   Falls   Swelling of left hand   Atypical chest pain   Complicated UTI (urinary tract infection)   Essential hypertension   UTI (urinary tract infection)   Vascular dementia   Protein-calorie malnutrition, severe   Palliative care encounter   Decreased appetite   Weakness   Agitation   Discharge Condition: guarded  Diet recommendation: regular  Filed Weights   03/01/15 0631 03/02/15 0539 03/03/15 0518  Weight: 64.1 kg (141 lb 5 oz) 62.5 kg (137 lb 12.6 oz) 63.2 kg (139 lb 5.3 oz)    History of present illness:  81 y.o. female, with a past medical history significant for CVA with left-sided hemiparesis, diastolic dysfunction, chronic kidney disease stage II, mitral and aortic stenosis, and hypertension. She presents to the emergency department from [facility] with chest pain. History was gathered from both the patient and her daughter as the patient has significant dementia and is a poor historian. The daughter tells me that the patient developed chest pain this morning between 9 and 9:30 it lasted about 25 minutes, and resolved on its own. The patient tells me that the pain was located in her sternal area that radiated in a band like fashion across her chest and felt like a heavy pressure. She reports not having pain like that previously. Further her daughter tells me that the patient has fallen  out of her wheelchair twice in the past week. Her left hand is swollen and bruised in the skilled nursing facility had a question of a torn rotator cuff in her left shoulder. There is supposed to be an orthopedic surgery appointment early next week.  Hospital Course:  Atypical chest pain: No resolved. Normal sinus rhythm on telemetry, troponins were just barely elevated at 0.04 the follow-up was trending down to 0.03. EKG shows no changes. Continue aspirin and Plavix.  Chronic indwelling catheter presenting with a urinary tract infection at the time of admission: Fully removed, urine culture showed Escherichia coli pansensitive, will start Bactrim.  She will need at least a 7 day course of antibiotics. Will need to follow up with urologist.  Acute on chronic urinary retention: She's having decreased urine output,bladder scan showed urine > 5oocc foley re-inserted. continue Flomax.  Progressive vascular dementia: Continue citalopram. Patient high risk of recurrent infection.  Left hand swelling: X-ray of the head showed no fractures, question rotator cuff tear will need further workup as an outpatient.  Essential hypertension: Effusions take her medications secondary to her dementia. So we'll continue to monitor.  Chronic diastolic heart failure: Seems euvolemic.  Procedures:  Left hand x-ray  Consultations:  none  Discharge Exam: Filed Vitals:   03/03/15 0804  BP: 152/51  Pulse: 73  Temp: 98 F (36.7 C)  Resp: 18    General: A&O x1 Cardiovascular: RRR Respiratory: good air movement CTA B/L  Discharge Instructions   Discharge Instructions    Diet - low sodium heart healthy  Complete by:  As directed      Increase activity slowly    Complete by:  As directed           Current Discharge Medication List    START taking these medications   Details  sulfamethoxazole-trimethoprim (BACTRIM DS,SEPTRA DS) 800-160 MG per tablet Take 1 tablet by mouth every 12  (twelve) hours. Qty: 14 tablet, Refills: 0      CONTINUE these medications which have CHANGED   Details  LORazepam (ATIVAN) 0.5 MG tablet Take 1 tablet (0.5 mg total) by mouth every 6 (six) hours as needed for anxiety. Qty: 30 tablet, Refills: 0      CONTINUE these medications which have NOT CHANGED   Details  albuterol (PROVENTIL HFA;VENTOLIN HFA) 108 (90 BASE) MCG/ACT inhaler Inhale 2 puffs into the lungs every 6 (six) hours as needed for shortness of breath.    aspirin 81 MG chewable tablet Chew 4 tablets (324 mg total) by mouth daily.    bisacodyl (DULCOLAX) 5 MG EC tablet Take 5 mg by mouth daily as needed for severe constipation.     carboxymethylcellulose (REFRESH) 1 % ophthalmic solution Place 1 drop into both eyes 3 (three) times daily.     cholecalciferol (VITAMIN D) 1000 UNITS tablet Take 1,000 Units by mouth daily.    citalopram (CELEXA) 20 MG tablet Take 20 mg by mouth daily.    clopidogrel (PLAVIX) 75 MG tablet Take 1 tablet (75 mg total) by mouth daily. Qty: 30 tablet, Refills: 0    diclofenac sodium (VOLTAREN) 1 % GEL Apply 2 g topically 3 (three) times daily as needed (pain).     esomeprazole (NEXIUM) 20 MG capsule Take 20 mg by mouth daily at 12 noon.    ferrous sulfate 325 (65 FE) MG tablet Take 325 mg by mouth daily with breakfast.    folic acid (FOLVITE) 1 MG tablet Take 1 mg by mouth daily.    Melatonin 5 MG CAPS Take 5 mg by mouth at bedtime.    methylphenidate (RITALIN) 5 MG tablet Take 2.5 mg by mouth daily.    polyethylene glycol (MIRALAX / GLYCOLAX) packet Take 17 g by mouth at bedtime.    tamsulosin (FLOMAX) 0.4 MG CAPS capsule Take 1 capsule (0.4 mg total) by mouth daily. Qty: 30 capsule, Refills: 0      STOP taking these medications     verapamil (CALAN-SR) 120 MG CR tablet        Allergies  Allergen Reactions  . Fentanyl Other (See Comments)    confusion   Follow-up Information    Follow up with Georgann Housekeeper, MD In 1 week.    Specialty:  Internal Medicine   Why:  hospital follow up   Contact information:   [address] [place] [address] [phone]        The results of significant diagnostics from this hospitalization (including imaging, microbiology, ancillary and laboratory) are listed below for reference.    Significant Diagnostic Studies: Dg Chest Port 1 View  02/26/2015   CLINICAL DATA:  Left-sided chest pain  EXAM: PORTABLE CHEST - 1 VIEW  COMPARISON:  12/28/2014  FINDINGS: The heart size and mediastinal contours are within normal limits. Both lungs are clear. The visualized skeletal structures are unremarkable. Right AC joint degenerative change noted. Left glenohumeral joint degenerative change noted.  IMPRESSION: No acute cardiopulmonary process.   Electronically Signed   By: Christiana Pellant M.D.   On: 02/26/2015 12:45   Dg  Hand Complete Left  02/26/2015   CLINICAL DATA:  Larey Seat and injured left hand while dancing 2 days ago. Persistent pain and bruising.  EXAM: LEFT HAND - COMPLETE 3+ VIEW  COMPARISON:  None.  FINDINGS: There are advanced degenerative changes involving the hand and wrist along with osteoporosis. No obvious fractures identified. No true lateral of the hand or wrist.  IMPRESSION: Advanced degenerative changes and osteoporosis but no obvious acute fracture.   Electronically Signed   By: Rudie Meyer M.D.   On: 02/26/2015 17:35    Microbiology: Recent Results (from the past 240 hour(s))  Urine culture     Status: None   Collection Time: 02/26/15  1:09 PM  Result Value Ref Range Status   Specimen Description URINE, RANDOM  Final   Special Requests CX ADDED AT 0500 ON 161096  Final   Colony Count   Final    >=100,000 COLONIES/ML Performed at [facility]    Culture   Final    ESCHERICHIA COLI Performed at [facility]    Report Status 03/02/2015 FINAL  Final   Organism ID, Bacteria ESCHERICHIA COLI  Final      Susceptibility   Escherichia  coli - MIC*    AMPICILLIN 4 SENSITIVE Sensitive     CEFAZOLIN <=4 SENSITIVE Sensitive     CEFTRIAXONE <=1 SENSITIVE Sensitive     CIPROFLOXACIN <=0.25 SENSITIVE Sensitive     GENTAMICIN <=1 SENSITIVE Sensitive     LEVOFLOXACIN <=0.12 SENSITIVE Sensitive     NITROFURANTOIN <=16 SENSITIVE Sensitive     TOBRAMYCIN <=1 SENSITIVE Sensitive     TRIMETH/SULFA <=20 SENSITIVE Sensitive     PIP/TAZO <=4 SENSITIVE Sensitive     * ESCHERICHIA COLI  MRSA PCR Screening     Status: Abnormal   Collection Time: 02/26/15  9:06 PM  Result Value Ref Range Status   MRSA by PCR POSITIVE (A) NEGATIVE Final    Comment:        The GeneXpert MRSA Assay (FDA approved for NASAL specimens only), is one component of a comprehensive MRSA colonization surveillance program. It is not intended to diagnose MRSA infection nor to guide or monitor treatment for MRSA infections. RESULT CALLED TO, READ BACK BY AND VERIFIED WITH: FUTRELL,M RN [phone] AT 0050 SKEEN,P   Clostridium Difficile by PCR     Status: None   Collection Time: 03/02/15  5:41 AM  Result Value Ref Range Status   C difficile by pcr NEGATIVE NEGATIVE Final     Labs: Basic Metabolic Panel:  Recent Labs Lab 02/26/15 1214 02/28/15 0435  NA 135 135  K 4.4 3.2*  CL 104 105  CO2 22 21  GLUCOSE 99 94  BUN 9 8  CREATININE 0.66 0.63  CALCIUM 9.4 9.2   Liver Function Tests: No results for input(s): AST, ALT, ALKPHOS, BILITOT, PROT, ALBUMIN in the last 168 hours. No results for input(s): LIPASE, AMYLASE in the last 168 hours. No results for input(s): AMMONIA in the last 168 hours. CBC:  Recent Labs Lab 02/26/15 1214 02/27/15 0100  WBC 13.0* 10.2  HGB 12.2 12.1  HCT 35.3* 35.2*  MCV 95.7 95.7  PLT 225 210   Cardiac Enzymes:  Recent Labs Lab 02/26/15 2016 02/26/15 2158 02/27/15 0100 02/27/15 0852  TROPONINI 0.03 0.03 0.04* <0.03   BNP: BNP (last 3 results)  Recent Labs  12/15/14 0645  BNP 1132.1*    ProBNP (last 3  results)  Recent Labs  04/08/14 0807 04/09/14 8119  PROBNP 5143.0* 16685.0*    CBG: No results for input(s): GLUCAP in the last 168 hours.     Signed:  Marinda Elk  [facility] 03/03/2015, 12:00 PM

## 2015-03-03 NOTE — Progress Notes (Signed)
BSW Intern set up transportation for pt via [facility] for first available. BSW intern contacted [facility] and sent discharge summary to facility. [place] pt's nurse has been contacted. POA daughter [name] is aware of discharge. Pt is confused and only alert to person her POA has been updated on discharge information.  [address] [place], BSW Intern, [phone]  Social Work signing off.

## 2015-03-03 NOTE — Progress Notes (Signed)
Pt had an incontinent episode. Pt bladder scanned and found to have 400cc residual. Waited to a hour to see if had another incontinent episode. RN and Nurse Tech tried to encourage pt to use the bathroom. Foley Catheter placed and received 350cc of urine output.

## 2015-03-03 NOTE — Progress Notes (Signed)
Called report to [facility]  At [employer]. Patient being discharged via ambulance transport.

## 2015-07-23 ENCOUNTER — Encounter (HOSPITAL_COMMUNITY): Payer: Self-pay | Admitting: Vascular Surgery

## 2015-07-23 ENCOUNTER — Emergency Department (HOSPITAL_COMMUNITY)
Admission: EM | Admit: 2015-07-23 | Discharge: 2015-07-23 | Disposition: A | Discharge status: Home / Self Care | Attending: Emergency Medicine | Admitting: Emergency Medicine

## 2015-07-23 DIAGNOSIS — R4182 Altered mental status, unspecified: Secondary | ICD-10-CM | POA: Diagnosis present

## 2015-07-23 DIAGNOSIS — Z515 Encounter for palliative care: Secondary | ICD-10-CM | POA: Insufficient documentation

## 2015-07-23 DIAGNOSIS — Z960 Presence of urogenital implants: Secondary | ICD-10-CM | POA: Diagnosis not present

## 2015-07-23 DIAGNOSIS — N390 Urinary tract infection, site not specified: Secondary | ICD-10-CM | POA: Insufficient documentation

## 2015-07-23 DIAGNOSIS — M1990 Unspecified osteoarthritis, unspecified site: Secondary | ICD-10-CM | POA: Diagnosis not present

## 2015-07-23 DIAGNOSIS — I129 Hypertensive chronic kidney disease with stage 1 through stage 4 chronic kidney disease, or unspecified chronic kidney disease: Secondary | ICD-10-CM | POA: Insufficient documentation

## 2015-07-23 DIAGNOSIS — Z8673 Personal history of transient ischemic attack (TIA), and cerebral infarction without residual deficits: Secondary | ICD-10-CM | POA: Insufficient documentation

## 2015-07-23 DIAGNOSIS — Z862 Personal history of diseases of the blood and blood-forming organs and certain disorders involving the immune mechanism: Secondary | ICD-10-CM | POA: Diagnosis not present

## 2015-07-23 DIAGNOSIS — R55 Syncope and collapse: Secondary | ICD-10-CM | POA: Diagnosis not present

## 2015-07-23 DIAGNOSIS — Z87891 Personal history of nicotine dependence: Secondary | ICD-10-CM | POA: Insufficient documentation

## 2015-07-23 DIAGNOSIS — Z978 Presence of other specified devices: Secondary | ICD-10-CM

## 2015-07-23 DIAGNOSIS — N189 Chronic kidney disease, unspecified: Secondary | ICD-10-CM | POA: Diagnosis not present

## 2015-07-23 DIAGNOSIS — F0390 Unspecified dementia without behavioral disturbance: Secondary | ICD-10-CM | POA: Insufficient documentation

## 2015-07-23 DIAGNOSIS — F419 Anxiety disorder, unspecified: Secondary | ICD-10-CM | POA: Insufficient documentation

## 2015-07-23 DIAGNOSIS — Z79899 Other long term (current) drug therapy: Secondary | ICD-10-CM | POA: Insufficient documentation

## 2015-07-23 DIAGNOSIS — E669 Obesity, unspecified: Secondary | ICD-10-CM | POA: Diagnosis not present

## 2015-07-23 DIAGNOSIS — G4700 Insomnia, unspecified: Secondary | ICD-10-CM | POA: Diagnosis not present

## 2015-07-23 DIAGNOSIS — R630 Anorexia: Secondary | ICD-10-CM | POA: Insufficient documentation

## 2015-07-23 LAB — COMPREHENSIVE METABOLIC PANEL
ALBUMIN: 3.6 g/dL (ref 3.5–5.0)
ALK PHOS: 54 U/L (ref 38–126)
ALT: 11 U/L — AB (ref 14–54)
AST: 22 U/L (ref 15–41)
Anion gap: 14 (ref 5–15)
BUN: 12 mg/dL (ref 6–20)
CALCIUM: 10.2 mg/dL (ref 8.9–10.3)
CO2: 21 mmol/L — ABNORMAL LOW (ref 22–32)
CREATININE: 0.97 mg/dL (ref 0.44–1.00)
Chloride: 103 mmol/L (ref 101–111)
GFR calc Af Amer: >60 mL/min (ref >60)
GFR calc non Af Amer: 53 mL/min — ABNORMAL LOW (ref >60)
GLUCOSE: 105 mg/dL — AB (ref 65–99)
Potassium: 4.2 mmol/L (ref 3.5–5.1)
Sodium: 138 mmol/L (ref 135–145)
Total Bilirubin: 1 mg/dL (ref 0.3–1.2)
Total Protein: 8 g/dL (ref 6.5–8.1)

## 2015-07-23 LAB — CBC WITH DIFFERENTIAL/PLATELET
BASOS PCT: 0 % (ref 0–1)
Basophils Absolute: 0 10*3/uL (ref 0.0–0.1)
Eosinophils Absolute: 0 10*3/uL (ref 0.0–0.7)
Eosinophils Relative: 0 % (ref 0–5)
HEMATOCRIT: 36.4 % (ref 36.0–46.0)
HEMOGLOBIN: 12.3 g/dL (ref 12.0–15.0)
LYMPHS ABS: 1.9 10*3/uL (ref 0.7–4.0)
Lymphocytes Relative: 24 % (ref 12–46)
MCH: 33.4 pg (ref 26.0–34.0)
MCHC: 33.8 g/dL (ref 30.0–36.0)
MCV: 98.9 fL (ref 78.0–100.0)
MONOS PCT: 6 % (ref 3–12)
Monocytes Absolute: 0.4 10*3/uL (ref 0.1–1.0)
NEUTROS ABS: 5.6 10*3/uL (ref 1.7–7.7)
NEUTROS PCT: 70 % (ref 43–77)
Platelets: 284 10*3/uL (ref 150–400)
RBC: 3.68 MIL/uL — ABNORMAL LOW (ref 3.87–5.11)
RDW: 15.4 % (ref 11.5–15.5)
WBC: 8 10*3/uL (ref 4.0–10.5)

## 2015-07-23 LAB — I-STAT TROPONIN, ED: Troponin i, poc: 0.01 ng/mL (ref 0.00–0.08)

## 2015-07-23 LAB — URINALYSIS, ROUTINE W REFLEX MICROSCOPIC
BILIRUBIN URINE: NEGATIVE
GLUCOSE, UA: NEGATIVE mg/dL
HGB URINE DIPSTICK: NEGATIVE
KETONES UR: 15 mg/dL — AB
Nitrite: POSITIVE — AB
PH: 8 (ref 5.0–8.0)
Protein, ur: NEGATIVE mg/dL
SPECIFIC GRAVITY, URINE: 1.01 (ref 1.005–1.030)
Urobilinogen, UA: 1 mg/dL (ref 0.0–1.0)

## 2015-07-23 LAB — URINE MICROSCOPIC-ADD ON

## 2015-07-23 MED ORDER — SODIUM CHLORIDE 0.9 % IV BOLUS (SEPSIS): 500.0000 mL | Freq: Once | INTRAVENOUS | Status: DC

## 2015-07-23 MED ORDER — CEPHALEXIN 500 MG PO CAPS: 500.0000 mg | ORAL_CAPSULE | Freq: Two times a day (BID) | ORAL | Status: DC

## 2015-07-23 MED ORDER — SODIUM CHLORIDE 0.9 % IV BOLUS (SEPSIS)
500.0000 mL | Freq: Once | INTRAVENOUS | Status: AC
  Administered 2015-07-23: 500 mL via INTRAVENOUS

## 2015-07-23 MED ORDER — CEPHALEXIN 250 MG PO CAPS
500.0000 mg | ORAL_CAPSULE | Freq: Once | ORAL | Status: AC
  Administered 2015-07-23: 500 mg via ORAL
  Filled 2015-07-23: qty 2

## 2015-07-23 MED ORDER — LORAZEPAM 1 MG PO TABS
0.5000 mg | ORAL_TABLET | Freq: Once | ORAL | Status: AC
  Administered 2015-07-23: 0.5 mg via ORAL
  Filled 2015-07-23: qty 1

## 2015-07-23 NOTE — Discharge Instructions (Signed)
Please follow with your primary care doctor in the next 2 days for a check-up. They must obtain records for further management.  ° °Do not hesitate to return to the Emergency Department for any new, worsening or concerning symptoms.  ° ° °Urinary Tract Infection °Urinary tract infections (UTIs) can develop anywhere along your urinary tract. Your urinary tract is your body's drainage system for removing wastes and extra water. Your urinary tract includes two kidneys, two ureters, a bladder, and a urethra. Your kidneys are a pair of bean-shaped organs. Each kidney is about the size of your fist. They are located below your ribs, one on each side of your spine. °CAUSES °Infections are caused by microbes, which are microscopic organisms, including fungi, viruses, and bacteria. These organisms are so small that they can only be seen through a microscope. Bacteria are the microbes that most commonly cause UTIs. °SYMPTOMS  °Symptoms of UTIs may vary by age and gender of the patient and by the location of the infection. Symptoms in young women typically include a frequent and intense urge to urinate and a painful, burning feeling in the bladder or urethra during urination. Older women and men are more likely to be tired, shaky, and weak and have muscle aches and abdominal pain. A fever may mean the infection is in your kidneys. Other symptoms of a kidney infection include pain in your back or sides below the ribs, nausea, and vomiting. °DIAGNOSIS °To diagnose a UTI, your caregiver will ask you about your symptoms. Your caregiver also will ask to provide a urine sample. The urine sample will be tested for bacteria and white blood cells. White blood cells are made by your body to help fight infection. °TREATMENT  °Typically, UTIs can be treated with medication. Because most UTIs are caused by a bacterial infection, they usually can be treated with the use of antibiotics. The choice of antibiotic and length of treatment depend  on your symptoms and the type of bacteria causing your infection. °HOME CARE INSTRUCTIONS °· If you were prescribed antibiotics, take them exactly as your caregiver instructs you. Finish the medication even if you feel better after you have only taken some of the medication. °· Drink enough water and fluids to keep your urine clear or pale yellow. °· Avoid caffeine, tea, and carbonated beverages. They tend to irritate your bladder. °· Empty your bladder often. Avoid holding urine for long periods of time. °· Empty your bladder before and after sexual intercourse. °· After a bowel movement, women should cleanse from front to back. Use each tissue only once. °SEEK MEDICAL CARE IF:  °· You have back pain. °· You develop a fever. °· Your symptoms do not begin to resolve within 3 days. °SEEK IMMEDIATE MEDICAL CARE IF:  °· You have severe back pain or lower abdominal pain. °· You develop chills. °· You have nausea or vomiting. °· You have continued burning or discomfort with urination. °MAKE SURE YOU:  °· Understand these instructions. °· Will watch your condition. °· Will get help right away if you are not doing well or get worse. °Document Released: 08/29/2005 Document Revised: 05/20/2012 Document Reviewed: 12/28/2011 °ExitCare® Patient Information ©2015 [employer]. This information is not intended to replace advice given to you by your health care provider. Make sure you discuss any questions you have with your health care provider. ° °

## 2015-07-23 NOTE — ED Notes (Signed)
2 RNs attempted to obtain IV access. Both unsuccessful IV team paged.  

## 2015-07-23 NOTE — ED Provider Notes (Signed)
CSN: [id number]     Arrival date & time 07/23/15  1524 History   First MD Initiated Contact with Patient 07/23/15 1538     Chief Complaint  Patient presents with  . Altered Mental Status     (Consider location/radiation/quality/duration/timing/severity/associated sxs/prior Treatment) Patient is a 81 y.o. female presenting with altered mental status.  Altered Mental Status    Blood pressure 142/52, pulse 60, temperature 97.8 F (36.6 C), temperature source Oral, resp. rate 16, SpO2 99 %.  ALDEA AVIS is a 81 y.o. female with vascular dementia accompanied by both daughters, she is a SNF resident at [facility] where patient had a witnessed episode of unresponsiveness, patient was initially snoring, she had been very drowsy when family visited today, nodding off while she was eating. She's been eating less than normally. She had an episode where they could not rouse her for approximately 30 minutes. Patient then became responsive 30' there was no tonic clonic movement  Past Medical History  Diagnosis Date  . Allergy   . Anxiety   . Insomnia   . Osteoarthritis   . Anemia   . Obesity   . Diastolic dysfunction   . CKD (chronic kidney disease)   . Mitral stenosis     with moderate MR on echo 4/14  . Aortic stenosis     mild by echo 4/14  . Aortic regurgitation     mild to moderate by echo 4/14  . HTN (hypertension)   . Stroke   . Vascular dementia    Past Surgical History  Procedure Laterality Date  . Eye surgery    . Abdominal hysterectomy    . Appendectomy    . Cataract extraction, bilateral    . Tee without cardioversion N/A 04/13/2014    Procedure: TRANSESOPHAGEAL ECHOCARDIOGRAM (TEE);  Surgeon: Vesta Mixer, MD;  Location: [facility] ENDOSCOPY;  Service: Cardiovascular;  Laterality: N/A;   Family History  Problem Relation Age of Onset  . Hypertension Mother   . CVA Mother    Social History  Substance Use Topics  . Smoking status: Former Smoker -- 0.25 packs/day  for 1 years  . Smokeless tobacco: Never Used     Comment: smoked 1-3 cigs/day in school for about a year some days only  . Alcohol Use: No   OB History    No data available     Review of Systems  10 systems reviewed and found to be negative, except as noted in the HPI.  Allergies  Fentanyl  Home Medications   Prior to Admission medications   Medication Sig Start Date End Date Taking? Authorizing Provider  acetaminophen (TYLENOL) 325 MG tablet Take 650 mg by mouth every 8 (eight) hours. Scheduled 6a, 2p, 10p   Yes Historical Provider, MD  acetaminophen (TYLENOL) 500 MG tablet Take 1,000 mg by mouth every 6 (six) hours as needed (pain).   Yes Historical Provider, MD  bisacodyl (DULCOLAX) 5 MG EC tablet Take 5 mg by mouth daily as needed (constipation).    Yes Historical Provider, MD  carboxymethylcellulose (REFRESH) 1 % ophthalmic solution Place 1 drop into both eyes at bedtime.    Yes Historical Provider, MD  citalopram (CELEXA) 20 MG tablet Take 20 mg by mouth daily.   Yes Historical Provider, MD  clopidogrel (PLAVIX) 75 MG tablet Take 1 tablet (75 mg total) by mouth daily. 11/17/14  Yes Rhetta Mura, MD  diphenhydrAMINE (BENADRYL) 25 mg capsule Take 25 mg by mouth every 8 (eight) hours as  needed for itching.   Yes Historical Provider, MD  esomeprazole (NEXIUM) 20 MG capsule Take 20 mg by mouth daily before breakfast.    Yes Historical Provider, MD  Eyelid Cleansers (OCUSOFT EYELID CLEANSING) PADS Apply 1 application topically See admin instructions. Use to clean eyelids twice daily and as needed for matted eyes   Yes Historical Provider, MD  haloperidol (HALDOL) 0.5 MG tablet Take 0.5 mg by mouth every 6 (six) hours as needed for agitation.   Yes Historical Provider, MD  hydrALAZINE (APRESOLINE) 25 MG tablet Take 75 mg by mouth 3 (three) times daily. Hold for SBP <130  6am, 2pm, 10pm   Yes Historical Provider, MD  hydrOXYzine (ATARAX/VISTARIL) 25 MG tablet Take 25 mg by mouth  every 8 (eight) hours as needed for itching.   Yes Historical Provider, MD  LORazepam (ATIVAN) 0.5 MG tablet Take 1 tablet (0.5 mg total) by mouth every 6 (six) hours as needed for anxiety. Patient taking differently: Take 0.5 mg by mouth 2 (two) times daily. 9am, 8pm 03/03/15  Yes Marinda Elk, MD  Melatonin 5 MG TABS Take 5 mg by mouth at bedtime.   Yes Historical Provider, MD  Polyethyl Glycol-Propyl Glycol (SYSTANE) 0.4-0.3 % SOLN Place 1 drop into both eyes 3 (three) times daily. 9am, 1pm, 5pm   Yes Historical Provider, MD  polyethylene glycol (MIRALAX / GLYCOLAX) packet Take 17 g by mouth daily. Mix in 8 oz fluid and drink   Yes Historical Provider, MD  potassium chloride SA (K-DUR,KLOR-CON) 20 MEQ tablet Take 20 mEq by mouth daily.   Yes Historical Provider, MD  predniSONE (DELTASONE) 5 MG tablet Take 5 mg by mouth daily.   Yes Historical Provider, MD  senna-docusate (SENNA-S) 8.6-50 MG per tablet Take 1 tablet by mouth at bedtime.   Yes Historical Provider, MD  tamsulosin (FLOMAX) 0.4 MG CAPS capsule Take 1 capsule (0.4 mg total) by mouth daily. Patient taking differently: Take 0.4 mg by mouth at bedtime.  12/29/14  Yes Maryann Mikhail, DO  traMADol (ULTRAM) 50 MG tablet Take 50 mg by mouth every 6 (six) hours as needed (pain).   Yes Historical Provider, MD  verapamil (CALAN-SR) 120 MG CR tablet Take 120 mg by mouth daily.   Yes Historical Provider, MD  aspirin 81 MG chewable tablet Chew 4 tablets (324 mg total) by mouth daily. Patient not taking: Reported on 07/23/2015 11/17/14   Rhetta Mura, MD  cephALEXin (KEFLEX) 500 MG capsule Take 1 capsule (500 mg total) by mouth 2 (two) times daily. 07/23/15   Zayed Griffie, PA-C   BP 186/74 mmHg  Pulse 64  Temp(Src) 97.8 F (36.6 C) (Oral)  Resp 14  SpO2 100% Physical Exam  Constitutional: She appears well-developed and well-nourished. No distress.  HENT:  Head: Normocephalic and atraumatic.  Mouth/Throat: Oropharynx is clear  and moist.  Eyes: Conjunctivae and EOM are normal. Pupils are equal, round, and reactive to light.  Neck: Normal range of motion.  Cardiovascular: Normal rate, regular rhythm and intact distal pulses.   Pulmonary/Chest: Effort normal and breath sounds normal. No stridor. No respiratory distress. She has no wheezes. She has no rales. She exhibits no tenderness.  Abdominal: Soft. Bowel sounds are normal. She exhibits no distension and no mass. There is no tenderness. There is no rebound and no guarding.  Musculoskeletal: Normal range of motion.  Neurological: She is alert.  Psychiatric: She has a normal mood and affect.  Nursing note and vitals reviewed.   ED Course  Procedures (including critical care time) Labs Review Labs Reviewed  CBC WITH DIFFERENTIAL/PLATELET - Abnormal; Notable for the following:    RBC 3.68 (*)    All other components within normal limits  COMPREHENSIVE METABOLIC PANEL - Abnormal; Notable for the following:    CO2 21 (*)    Glucose, Bld 105 (*)    ALT 11 (*)    GFR calc non Af Amer 53 (*)    All other components within normal limits  URINALYSIS, ROUTINE W REFLEX MICROSCOPIC (NOT AT [facility]) - Abnormal; Notable for the following:    APPearance CLOUDY (*)    Ketones, ur 15 (*)    Nitrite POSITIVE (*)    Leukocytes, UA MODERATE (*)    All other components within normal limits  URINE MICROSCOPIC-ADD ON - Abnormal; Notable for the following:    Squamous Epithelial / LPF FEW (*)    Bacteria, UA FEW (*)    All other components within normal limits  URINE CULTURE  I-STAT TROPOININ, ED    Imaging Review No results found. I have personally reviewed and evaluated these images and lab results as part of my medical decision-making.   EKG Interpretation None      MDM   Final diagnoses:  UTI (lower urinary tract infection)  Chronic indwelling Foley catheter  Hospice care patient  Dementia, without behavioral disturbance  Consciousness loss, transient     Filed Vitals:   07/23/15 1815 07/23/15 1830 07/23/15 1845 07/23/15 1900  BP: 168/63 163/68 184/70 186/74  Pulse: 63 70 64 64  Temp:      TempSrc:      Resp: 17 17 16 14   SpO2: 100% 100% 100% 100%    Medications  sodium chloride 0.9 % bolus 500 mL (0 mLs Intravenous Stopped 07/23/15 1906)  LORazepam (ATIVAN) tablet 0.5 mg (0.5 mg Oral Given 07/23/15 1832)  cephALEXin (KEFLEX) capsule 500 mg (500 mg Oral Given 07/23/15 1905)    JAEMARIE HOCHBERG is a pleasant 81 y.o. female presenting with episode of unresponsiveness for approximately 30 minutes. Patient is mentating at her baseline as per her 2 daughters who accompany her here today. Patient with no complaints and she is alert at this point. There was no signs of a seizure. Initially planned CT head and chest x-ray however I discussed this with hospice physician Dr. [id number] who explains that with she considers aggressive investigation patient will have to be removed from hospice. This patient is mentating at her baseline, her lung sounds are clear and she is saturating well on room air will DC head CT and chest x-ray. Discussed with attending physician who agrees with care plan. She has a chronic indwelling Foley, she was treated for UTI 1 month ago.  Urinalysis today is positive for nitrites and leukocytes, we'll culture and start on Keflex. Blood work reassuring. Patient will be discharged back to SNF.  Evaluation does not show pathology that would require ongoing emergent intervention or inpatient treatment. Pt is hemodynamically stable and mentating appropriately. Discussed findings and plan with patient/guardian, who agrees with care plan. All questions answered. Return precautions discussed and outpatient follow up given.   New Prescriptions   CEPHALEXIN (KEFLEX) 500 MG CAPSULE    Take 1 capsule (500 mg total) by mouth 2 (two) times daily.         Garnett Farm, PA-C 07/23/15 1912  07/25/15, PA-C 07/23/15  1915  07/25/15, MD 07/23/15 2225

## 2015-07-23 NOTE — ED Notes (Signed)
Pt reports to the ED via [facility] for eval of unresponsive episode. Pt is from [facility] where staff witnessed her to have an episode of unresponsiveness. Staff attempted to rouse her with physical and verbal stimuli but they were unable to rouse her for approx 30 mins. After she came back around she was alert and oriented at baseline. Throughout the episode of unresponsiveness she was diaphoretic. Pt has hx of CVA and demetia. She is a DNR and hospice patient. 12 lead eb route unremarkable. Pt denies any complaints. Pt alert and oriented at baseline (person, place, situation, disoriented to time), resp e/u, and skin warm and dry.

## 2015-07-25 LAB — URINE CULTURE

## 2015-09-11 IMAGING — CR DG CHEST 2V
2 series · 2 of 2 positions shown · non-contrast
Comparison: 10/01/2013

CLINICAL DATA: Cough, shortness of breath

EXAM:
CHEST  2 VIEW

[w chest pa]
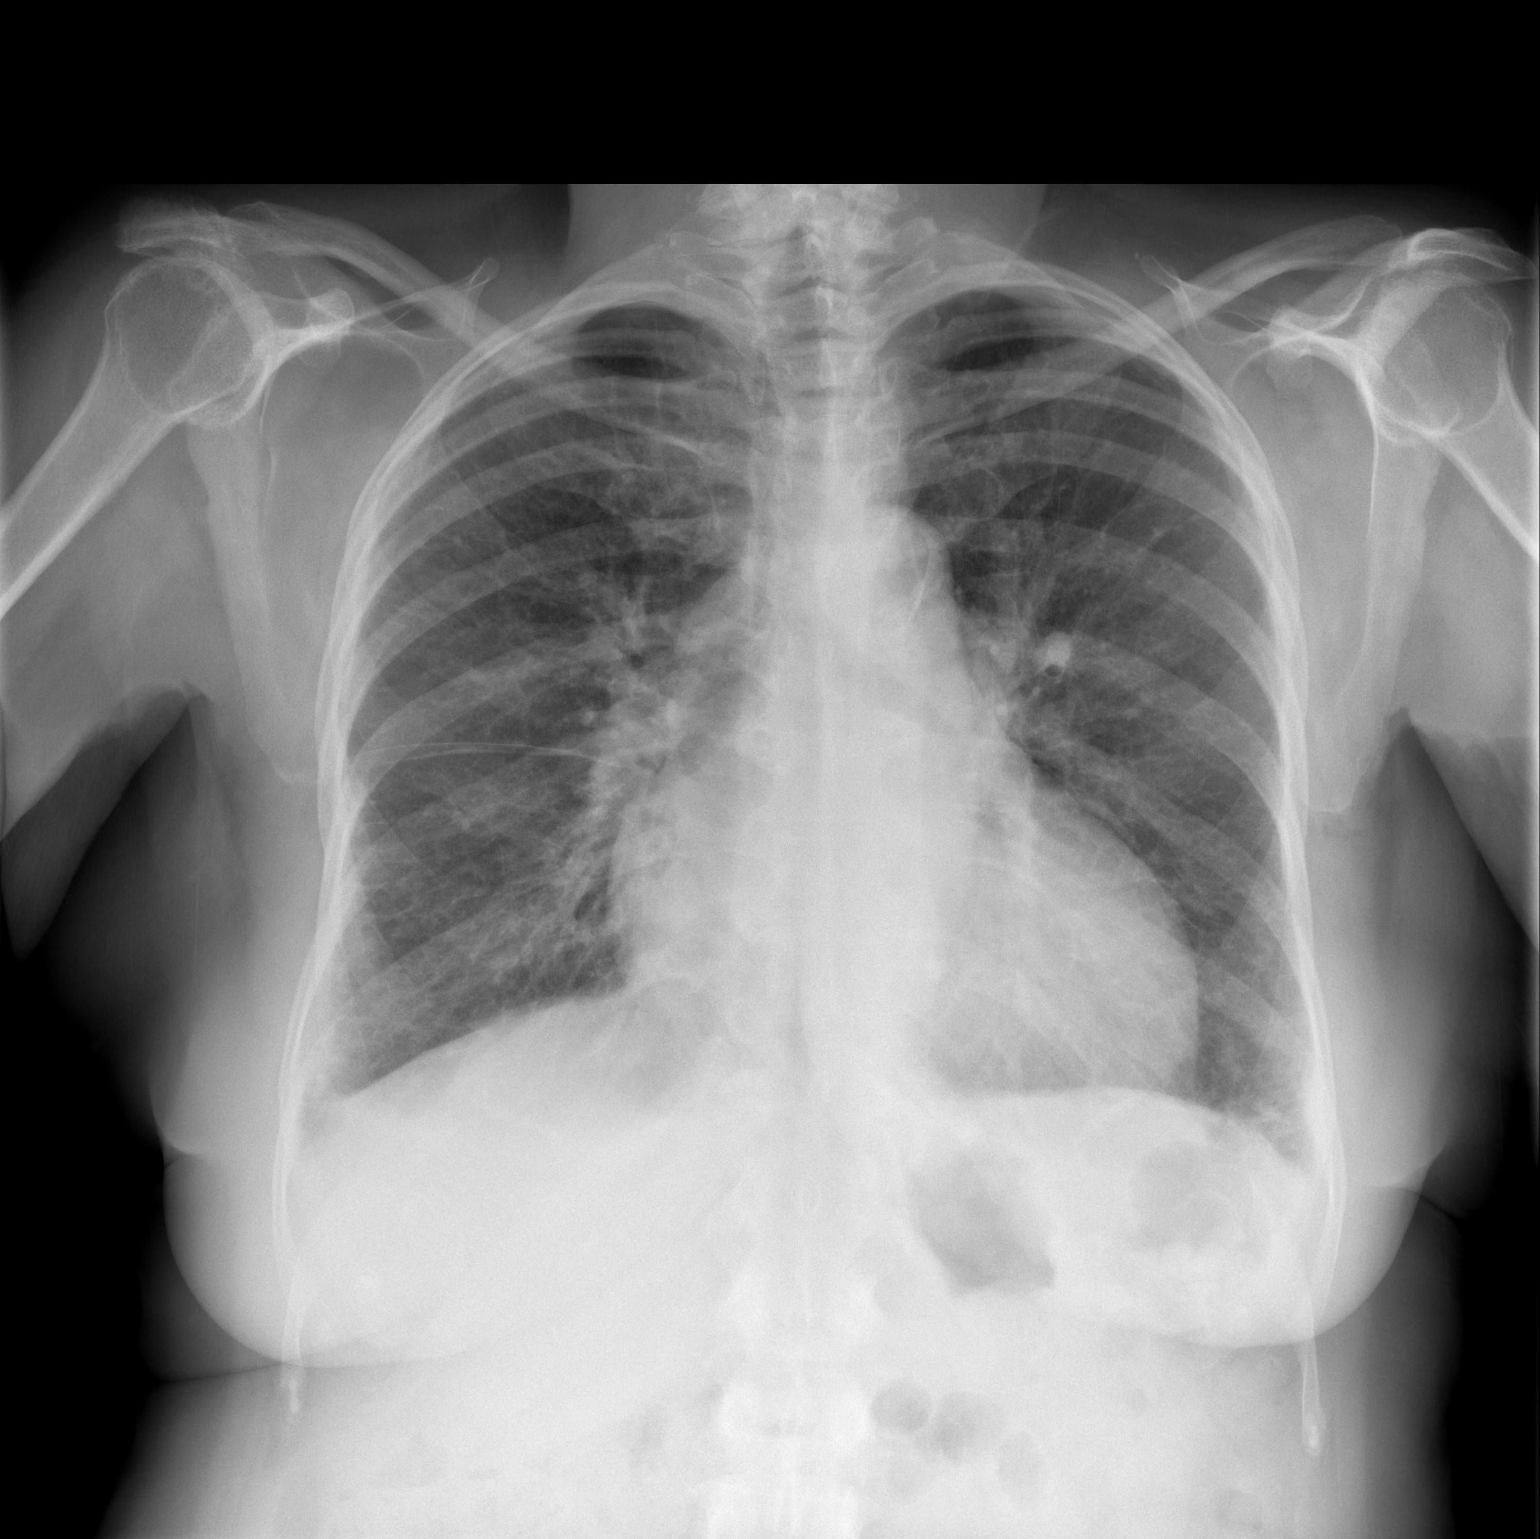

[w chest lat]
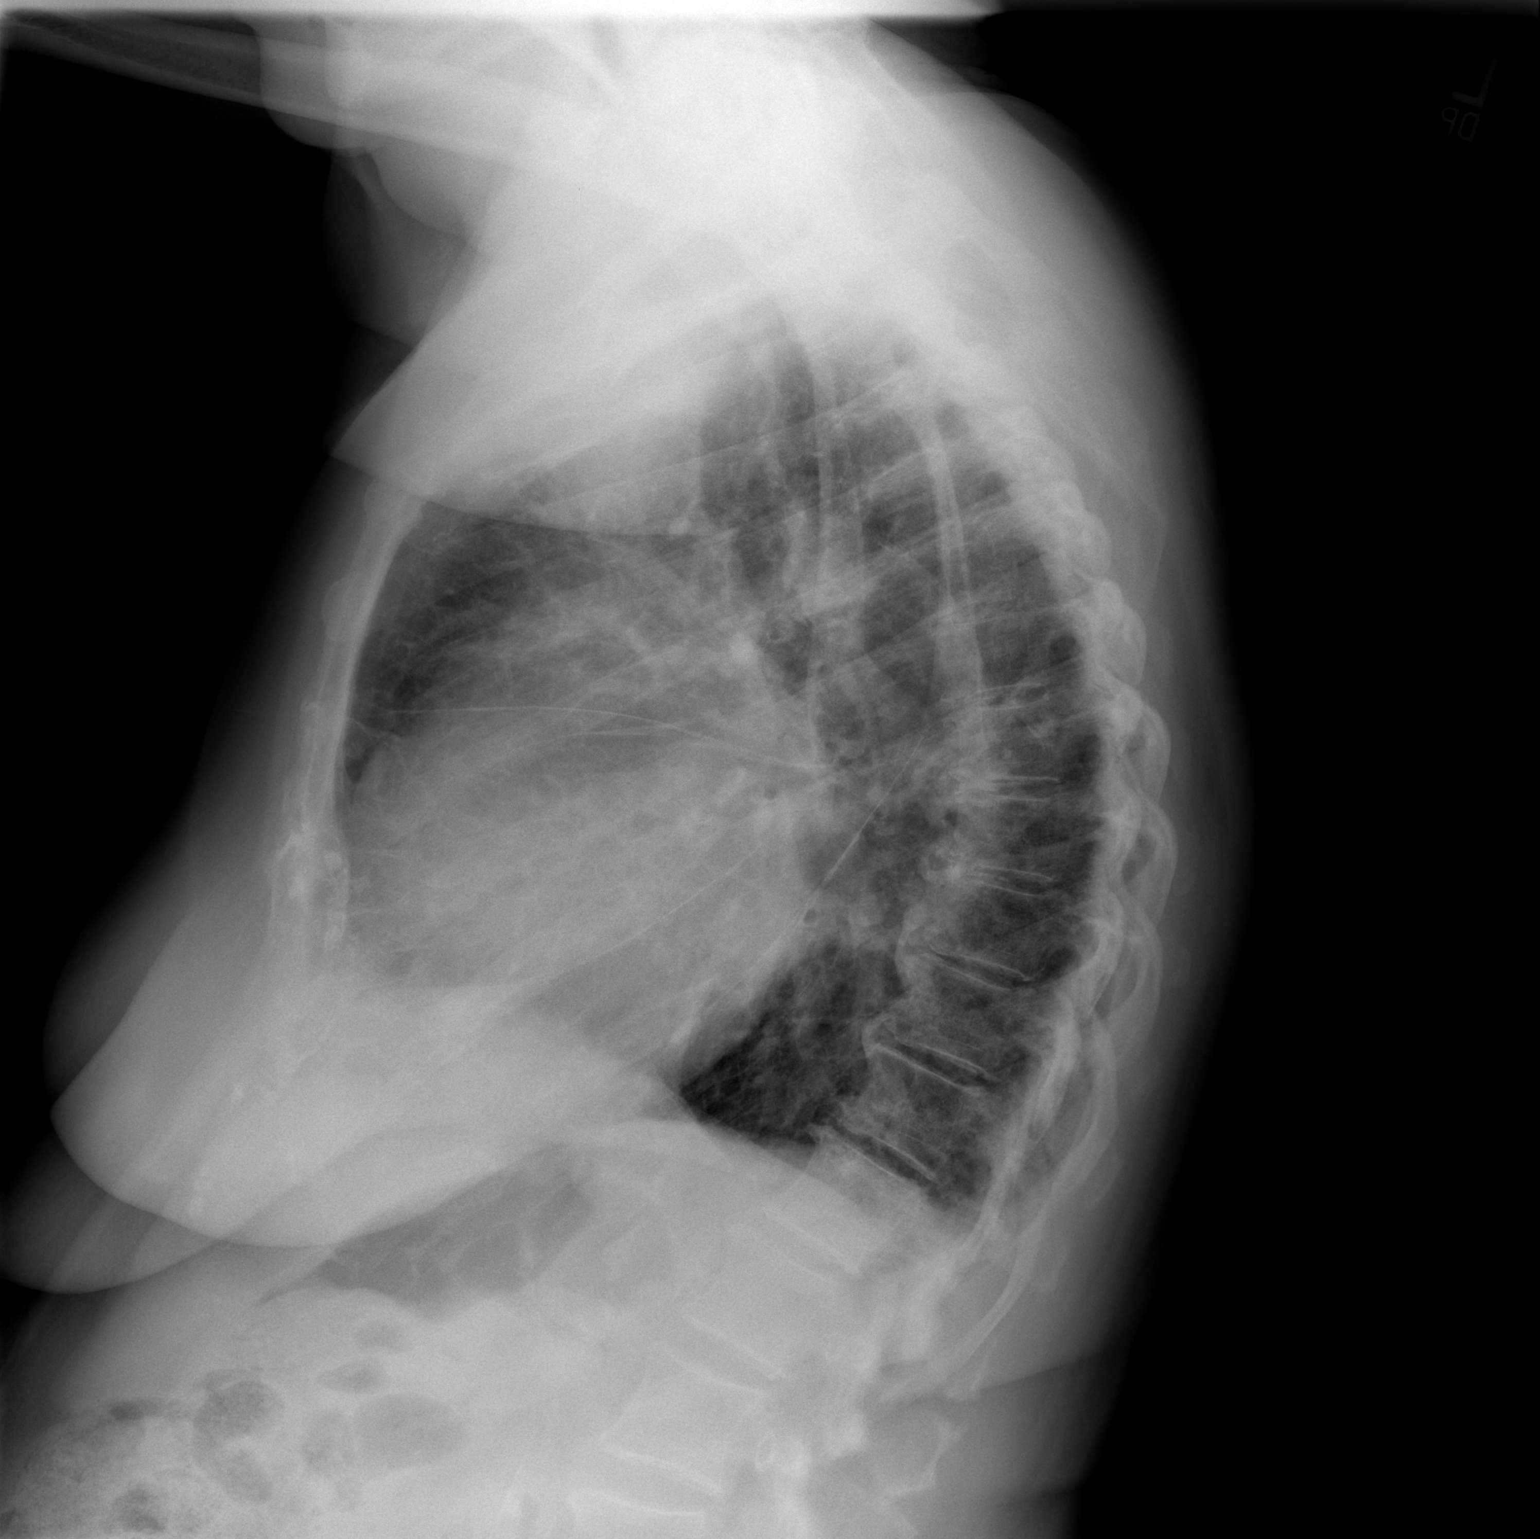

[2 of 2 positions shown; findings below may reference images not displayed]

FINDINGS: Mild to moderate cardiomegaly stable. Normal vascular pattern.
Minimal interstitial change in the bilateral bases most consistent
with mild atelectasis. No significant pleural fluid.
IMPRESSION: Stable appearance when compared to the prior study. No acute
findings.

## 2016-02-10 ENCOUNTER — Emergency Department (HOSPITAL_COMMUNITY): Payer: Medicare Other

## 2016-02-10 ENCOUNTER — Inpatient Hospital Stay (HOSPITAL_COMMUNITY)
Admission: EM | Admit: 2016-02-10 | Discharge: 2016-02-16 | DRG: 193 | Disposition: A | Discharge status: Skilled Nursing Facility | Payer: Medicare Other | Attending: Internal Medicine | Admitting: Internal Medicine

## 2016-02-10 ENCOUNTER — Encounter (HOSPITAL_COMMUNITY): Payer: Self-pay | Admitting: *Deleted

## 2016-02-10 DIAGNOSIS — Y95 Nosocomial condition: Secondary | ICD-10-CM | POA: Diagnosis present

## 2016-02-10 DIAGNOSIS — E876 Hypokalemia: Secondary | ICD-10-CM | POA: Diagnosis not present

## 2016-02-10 DIAGNOSIS — I5033 Acute on chronic diastolic (congestive) heart failure: Secondary | ICD-10-CM | POA: Diagnosis present

## 2016-02-10 DIAGNOSIS — Z79899 Other long term (current) drug therapy: Secondary | ICD-10-CM

## 2016-02-10 DIAGNOSIS — M1990 Unspecified osteoarthritis, unspecified site: Secondary | ICD-10-CM | POA: Diagnosis present

## 2016-02-10 DIAGNOSIS — Z885 Allergy status to narcotic agent status: Secondary | ICD-10-CM

## 2016-02-10 DIAGNOSIS — D649 Anemia, unspecified: Secondary | ICD-10-CM | POA: Diagnosis present

## 2016-02-10 DIAGNOSIS — Z7952 Long term (current) use of systemic steroids: Secondary | ICD-10-CM

## 2016-02-10 DIAGNOSIS — R05 Cough: Secondary | ICD-10-CM | POA: Diagnosis not present

## 2016-02-10 DIAGNOSIS — R0989 Other specified symptoms and signs involving the circulatory and respiratory systems: Secondary | ICD-10-CM

## 2016-02-10 DIAGNOSIS — Z7982 Long term (current) use of aspirin: Secondary | ICD-10-CM

## 2016-02-10 DIAGNOSIS — Z87891 Personal history of nicotine dependence: Secondary | ICD-10-CM

## 2016-02-10 DIAGNOSIS — Z823 Family history of stroke: Secondary | ICD-10-CM

## 2016-02-10 DIAGNOSIS — J189 Pneumonia, unspecified organism: Secondary | ICD-10-CM | POA: Diagnosis not present

## 2016-02-10 DIAGNOSIS — Z7902 Long term (current) use of antithrombotics/antiplatelets: Secondary | ICD-10-CM

## 2016-02-10 DIAGNOSIS — I10 Essential (primary) hypertension: Secondary | ICD-10-CM | POA: Diagnosis present

## 2016-02-10 DIAGNOSIS — I350 Nonrheumatic aortic (valve) stenosis: Secondary | ICD-10-CM

## 2016-02-10 DIAGNOSIS — Z8673 Personal history of transient ischemic attack (TIA), and cerebral infarction without residual deficits: Secondary | ICD-10-CM

## 2016-02-10 DIAGNOSIS — F0150 Vascular dementia without behavioral disturbance: Secondary | ICD-10-CM | POA: Diagnosis present

## 2016-02-10 DIAGNOSIS — I5032 Chronic diastolic (congestive) heart failure: Secondary | ICD-10-CM | POA: Diagnosis present

## 2016-02-10 DIAGNOSIS — Z66 Do not resuscitate: Secondary | ICD-10-CM | POA: Diagnosis present

## 2016-02-10 DIAGNOSIS — I080 Rheumatic disorders of both mitral and aortic valves: Secondary | ICD-10-CM | POA: Diagnosis present

## 2016-02-10 LAB — BRAIN NATRIURETIC PEPTIDE: B Natriuretic Peptide: 1260.9 pg/mL — ABNORMAL HIGH (ref 0.0–100.0)

## 2016-02-10 LAB — BASIC METABOLIC PANEL
Anion gap: 13 (ref 5–15)
BUN: 17 mg/dL (ref 6–20)
CALCIUM: 9.2 mg/dL (ref 8.9–10.3)
CO2: 23 mmol/L (ref 22–32)
CREATININE: 0.72 mg/dL (ref 0.44–1.00)
Chloride: 106 mmol/L (ref 101–111)
Glucose, Bld: 96 mg/dL (ref 65–99)
Potassium: 3.7 mmol/L (ref 3.5–5.1)
SODIUM: 142 mmol/L (ref 135–145)

## 2016-02-10 LAB — I-STAT TROPONIN, ED: Troponin i, poc: 0.05 ng/mL (ref 0.00–0.08)

## 2016-02-10 LAB — CBC
HCT: 33.6 % — ABNORMAL LOW (ref 36.0–46.0)
Hemoglobin: 11.2 g/dL — ABNORMAL LOW (ref 12.0–15.0)
MCH: 33.3 pg (ref 26.0–34.0)
MCHC: 33.3 g/dL (ref 30.0–36.0)
MCV: 100 fL (ref 78.0–100.0)
PLATELETS: 189 10*3/uL (ref 150–400)
RBC: 3.36 MIL/uL — AB (ref 3.87–5.11)
RDW: 14.8 % (ref 11.5–15.5)
WBC: 9.2 10*3/uL (ref 4.0–10.5)

## 2016-02-10 LAB — I-STAT CG4 LACTIC ACID, ED: LACTIC ACID, VENOUS: 1.77 mmol/L (ref 0.5–2.0)

## 2016-02-10 MED ORDER — DEXTROSE 5 % IV SOLN
1.0000 g | Freq: Once | INTRAVENOUS | Status: AC
  Administered 2016-02-10: 1 g via INTRAVENOUS
  Filled 2016-02-10: qty 10

## 2016-02-10 MED ORDER — AZITHROMYCIN 500 MG IV SOLR
500.0000 mg | Freq: Once | INTRAVENOUS | Status: AC
  Administered 2016-02-10: 500 mg via INTRAVENOUS
  Filled 2016-02-10: qty 500

## 2016-02-10 MED ORDER — SODIUM CHLORIDE 0.9 % IV SOLN
1000.0000 mL | INTRAVENOUS | Status: DC
  Administered 2016-02-10: 1000 mL via INTRAVENOUS

## 2016-02-10 NOTE — ED Provider Notes (Addendum)
CSN: [id number]     Arrival date & time 02/10/16  2022 History   First MD Initiated Contact with Patient 02/10/16 2032     Chief Complaint  Patient presents with  . Bronchitis     (Consider location/radiation/quality/duration/timing/severity/associated sxs/prior Treatment) HPI Comments: Patient is an 82 year old female who lives at [facility] presenting today with a six-day history of worsening cough, shortness of breath and fever. Patient had an x-ray done on Monday which showed bronchitis but she was started on Augmentin. She has been taking Augmentin, prednisone and reading treatments without improvement of her symptoms. Patient was diaphoretic and febrile today with a persistent severe cough and family brought her for further evaluation. Patient denies any abdominal pain, nausea, vomiting or diarrhea. She does feel better after albuterol treatment in route.  The history is provided by the patient and a relative.    Past Medical History  Diagnosis Date  . Allergy   . Anxiety   . Insomnia   . Osteoarthritis   . Anemia   . Obesity   . Diastolic dysfunction   . CKD (chronic kidney disease)   . Mitral stenosis     with moderate MR on echo 4/14  . Aortic stenosis     mild by echo 4/14  . Aortic regurgitation     mild to moderate by echo 4/14  . HTN (hypertension)   . Stroke ([place])   . Vascular dementia    Past Surgical History  Procedure Laterality Date  . Eye surgery    . Abdominal hysterectomy    . Appendectomy    . Cataract extraction, bilateral    . Tee without cardioversion N/A 04/13/2014    Procedure: TRANSESOPHAGEAL ECHOCARDIOGRAM (TEE);  Surgeon: Thayer Headings, MD;  Location: [facility] ENDOSCOPY;  Service: Cardiovascular;  Laterality: N/A;   Family History  Problem Relation Age of Onset  . Hypertension Mother   . CVA Mother    Social History  Substance Use Topics  . Smoking status: Former Smoker -- 0.25 packs/day for 1 years  . Smokeless tobacco:  Never Used     Comment: smoked 1-3 cigs/day in school for about a year some days only  . Alcohol Use: No   OB History    No data available     Review of Systems  All other systems reviewed and are negative.     Allergies  Fentanyl  Home Medications   Prior to Admission medications   Medication Sig Start Date End Date Taking? Authorizing Provider  acetaminophen (TYLENOL) 325 MG tablet Take 650 mg by mouth every 8 (eight) hours. Scheduled 6a, 2p, 10p    Historical Provider, MD  acetaminophen (TYLENOL) 500 MG tablet Take 1,000 mg by mouth every 6 (six) hours as needed (pain).    Historical Provider, MD  aspirin 81 MG chewable tablet Chew 4 tablets (324 mg total) by mouth daily. Patient not taking: Reported on 07/23/2015 11/17/14   Nita Sells, MD  bisacodyl (DULCOLAX) 5 MG EC tablet Take 5 mg by mouth daily as needed (constipation).     Historical Provider, MD  carboxymethylcellulose (REFRESH) 1 % ophthalmic solution Place 1 drop into both eyes at bedtime.     Historical Provider, MD  cephALEXin (KEFLEX) 500 MG capsule Take 1 capsule (500 mg total) by mouth 2 (two) times daily. 07/23/15   Nicole Pisciotta, PA-C  citalopram (CELEXA) 20 MG tablet Take 20 mg by mouth daily.    Historical Provider, MD  clopidogrel (PLAVIX) 75  MG tablet Take 1 tablet (75 mg total) by mouth daily. 11/17/14   Nita Sells, MD  diphenhydrAMINE (BENADRYL) 25 mg capsule Take 25 mg by mouth every 8 (eight) hours as needed for itching.    Historical Provider, MD  esomeprazole (NEXIUM) 20 MG capsule Take 20 mg by mouth daily before breakfast.     Historical Provider, MD  Eyelid Cleansers (OCUSOFT EYELID CLEANSING) PADS Apply 1 application topically See admin instructions. Use to clean eyelids twice daily and as needed for matted eyes    Historical Provider, MD  haloperidol (HALDOL) 0.5 MG tablet Take 0.5 mg by mouth every 6 (six) hours as needed for agitation.    Historical Provider, MD  hydrALAZINE  (APRESOLINE) 25 MG tablet Take 75 mg by mouth 3 (three) times daily. Hold for SBP <130  6am, 2pm, 10pm    Historical Provider, MD  hydrOXYzine (ATARAX/VISTARIL) 25 MG tablet Take 25 mg by mouth every 8 (eight) hours as needed for itching.    Historical Provider, MD  LORazepam (ATIVAN) 0.5 MG tablet Take 1 tablet (0.5 mg total) by mouth every 6 (six) hours as needed for anxiety. Patient taking differently: Take 0.5 mg by mouth 2 (two) times daily. 9am, 8pm 03/03/15   Charlynne Cousins, MD  Melatonin 5 MG TABS Take 5 mg by mouth at bedtime.    Historical Provider, MD  Polyethyl Glycol-Propyl Glycol (SYSTANE) 0.4-0.3 % SOLN Place 1 drop into both eyes 3 (three) times daily. 9am, 1pm, 5pm    Historical Provider, MD  polyethylene glycol (MIRALAX / GLYCOLAX) packet Take 17 g by mouth daily. Mix in 8 oz fluid and drink    Historical Provider, MD  potassium chloride SA (K-DUR,KLOR-CON) 20 MEQ tablet Take 20 mEq by mouth daily.    Historical Provider, MD  predniSONE (DELTASONE) 5 MG tablet Take 5 mg by mouth daily.    Historical Provider, MD  senna-docusate (SENNA-S) 8.6-50 MG per tablet Take 1 tablet by mouth at bedtime.    Historical Provider, MD  tamsulosin (FLOMAX) 0.4 MG CAPS capsule Take 1 capsule (0.4 mg total) by mouth daily. Patient taking differently: Take 0.4 mg by mouth at bedtime.  12/29/14   Maryann Mikhail, DO  traMADol (ULTRAM) 50 MG tablet Take 50 mg by mouth every 6 (six) hours as needed (pain).    Historical Provider, MD  verapamil (CALAN-SR) 120 MG CR tablet Take 120 mg by mouth daily.    Historical Provider, MD   BP 154/73 mmHg  Pulse 74  Temp(Src) 98.2 F (36.8 C)  Resp 16  SpO2 98% Physical Exam  Constitutional: She appears well-developed and well-nourished. No distress.  HENT:  Head: Normocephalic and atraumatic.  Mouth/Throat: Oropharynx is clear and moist.  Eyes: Conjunctivae and EOM are normal. Pupils are equal, round, and reactive to light.  Neck: Normal range of motion.  Neck supple.  Cardiovascular: Normal rate, regular rhythm and intact distal pulses.   No murmur heard. Pulmonary/Chest: Effort normal. No tachypnea. No respiratory distress. She has wheezes. She has rhonchi in the right lower field and the left lower field. She has no rales.  Abdominal: Soft. She exhibits no distension. There is no tenderness. There is no rebound and no guarding.  Musculoskeletal: Normal range of motion. She exhibits no edema or tenderness.  Neurological: She is alert.  Oriented to person and place.  Right extremity paralysis  Skin: Skin is warm and dry. No rash noted. No erythema.  Psychiatric: She has a normal mood and  affect. Her behavior is normal.  Nursing note and vitals reviewed.   ED Course  Procedures (including critical care time) Labs Review Labs Reviewed  CBC - Abnormal; Notable for the following:    RBC 3.36 (*)    Hemoglobin 11.2 (*)    HCT 33.6 (*)    All other components within normal limits  CULTURE, BLOOD (ROUTINE X 2)  CULTURE, BLOOD (ROUTINE X 2)  URINE CULTURE  BASIC METABOLIC PANEL  URINALYSIS, ROUTINE W REFLEX MICROSCOPIC (NOT AT [facility])  BRAIN NATRIURETIC PEPTIDE  INFLUENZA PANEL BY PCR (TYPE A & B, H1N1)  I-STAT CG4 LACTIC ACID, ED  I-STAT TROPOININ, ED  CBG MONITORING, ED  I-STAT CG4 LACTIC ACID, ED    Imaging Review Dg Chest 2 View  02/10/2016  CLINICAL DATA:  Cough, weakness x2 days EXAM: CHEST  2 VIEW COMPARISON:  02/26/2015 FINDINGS: Mild patchy left basilar opacity, atelectasis versus pneumonia. Cardiomegaly with pulmonary vascular congestion. No frank interstitial edema. No pleural effusion or pneumothorax. Degenerative changes of the visualized thoracolumbar spine. IMPRESSION: Mild patchy left basilar opacity, atelectasis versus pneumonia. Cardiomegaly with pulmonary vascular congestion. No frank interstitial edema. Electronically Signed   By: Julian Hy M.D.   On: 02/10/2016 21:35   I have personally reviewed and evaluated  these images and lab results as part of my medical decision-making.   EKG Interpretation   Date/Time:  Friday February 10 2016 21:02:04 EST Ventricular Rate:  73 PR Interval:  157 QRS Duration: 135 QT Interval:  440 QTC Calculation: 485 R Axis:   -45 Text Interpretation:  Sinus rhythm Left bundle branch block No significant  change since last tracing Confirmed by Maryan Rued  MD, Loree Fee ([id number]) on  02/10/2016 10:23:58 PM      MDM   Final diagnoses:  Community acquired pneumonia    Patient is an 82 year old female presenting with a six-day history of worsening cough, fever and shortness of breath. She was started on Augmentin on Monday and had an x-ray that showed bronchitis however symptoms have worsened. She does have a history of stroke and CHF and has no findings for fluid load today. She has no lower extremity edema, chest pain, nausea, vomiting or diarrhea.  Concern for pneumonia. Lower concern for PE or CHF.  EKG without acute findings. Chest x-ray, CBC, BMP, UA, BNP, lactate, troponin and blood cultures ordered.  11:30 PM Chest x-ray shows a patchy left basilar opacity concerning for pneumonia. Temperature 99.2 rectally white count is 9000. EKG and troponin without significant findings. No signs of heart failure on chest x-ray. Lactate within normal limits without criteria met for sepsis at this time. Patient is a DO NOT RESUSCITATE. She was started on Rocephin and azithromycin that she would be community-acquired.  Blanchie Dessert, MD 02/10/16 [id number]  Blanchie Dessert, MD 02/11/16 1324

## 2016-02-10 NOTE — ED Notes (Signed)
Pt by EMS from [facility] c/o shortness of breath. Recently diagnosed with bronchitis two weeks ago. EMS gave one duoned, pt c/o "not feeling well." Productive cough with yellow sputum.

## 2016-02-11 ENCOUNTER — Encounter (HOSPITAL_COMMUNITY): Payer: Self-pay | Admitting: Internal Medicine

## 2016-02-11 DIAGNOSIS — Z7952 Long term (current) use of systemic steroids: Secondary | ICD-10-CM | POA: Diagnosis not present

## 2016-02-11 DIAGNOSIS — Z87891 Personal history of nicotine dependence: Secondary | ICD-10-CM | POA: Diagnosis not present

## 2016-02-11 DIAGNOSIS — Y95 Nosocomial condition: Secondary | ICD-10-CM | POA: Diagnosis present

## 2016-02-11 DIAGNOSIS — I080 Rheumatic disorders of both mitral and aortic valves: Secondary | ICD-10-CM | POA: Diagnosis present

## 2016-02-11 DIAGNOSIS — I5033 Acute on chronic diastolic (congestive) heart failure: Secondary | ICD-10-CM | POA: Diagnosis present

## 2016-02-11 DIAGNOSIS — R05 Cough: Secondary | ICD-10-CM | POA: Diagnosis present

## 2016-02-11 DIAGNOSIS — Z79899 Other long term (current) drug therapy: Secondary | ICD-10-CM | POA: Diagnosis not present

## 2016-02-11 DIAGNOSIS — E876 Hypokalemia: Secondary | ICD-10-CM | POA: Diagnosis not present

## 2016-02-11 DIAGNOSIS — I10 Essential (primary) hypertension: Secondary | ICD-10-CM | POA: Diagnosis present

## 2016-02-11 DIAGNOSIS — Z7982 Long term (current) use of aspirin: Secondary | ICD-10-CM | POA: Diagnosis not present

## 2016-02-11 DIAGNOSIS — I5032 Chronic diastolic (congestive) heart failure: Secondary | ICD-10-CM | POA: Diagnosis not present

## 2016-02-11 DIAGNOSIS — D649 Anemia, unspecified: Secondary | ICD-10-CM | POA: Diagnosis present

## 2016-02-11 DIAGNOSIS — Z885 Allergy status to narcotic agent status: Secondary | ICD-10-CM | POA: Diagnosis not present

## 2016-02-11 DIAGNOSIS — J189 Pneumonia, unspecified organism: Principal | ICD-10-CM

## 2016-02-11 DIAGNOSIS — Z66 Do not resuscitate: Secondary | ICD-10-CM | POA: Diagnosis present

## 2016-02-11 DIAGNOSIS — F0150 Vascular dementia without behavioral disturbance: Secondary | ICD-10-CM | POA: Diagnosis present

## 2016-02-11 DIAGNOSIS — Z823 Family history of stroke: Secondary | ICD-10-CM | POA: Diagnosis not present

## 2016-02-11 DIAGNOSIS — Z7902 Long term (current) use of antithrombotics/antiplatelets: Secondary | ICD-10-CM | POA: Diagnosis not present

## 2016-02-11 DIAGNOSIS — M1990 Unspecified osteoarthritis, unspecified site: Secondary | ICD-10-CM | POA: Diagnosis present

## 2016-02-11 DIAGNOSIS — Z8673 Personal history of transient ischemic attack (TIA), and cerebral infarction without residual deficits: Secondary | ICD-10-CM | POA: Diagnosis not present

## 2016-02-11 DIAGNOSIS — I350 Nonrheumatic aortic (valve) stenosis: Secondary | ICD-10-CM | POA: Diagnosis not present

## 2016-02-11 LAB — CBC WITH DIFFERENTIAL/PLATELET
Basophils Absolute: 0 10*3/uL (ref 0.0–0.1)
Basophils Relative: 1 %
Eosinophils Absolute: 0.1 10*3/uL (ref 0.0–0.7)
Eosinophils Relative: 1 %
HEMATOCRIT: 32.2 % — AB (ref 36.0–46.0)
HEMOGLOBIN: 10.2 g/dL — AB (ref 12.0–15.0)
LYMPHS ABS: 1.6 10*3/uL (ref 0.7–4.0)
Lymphocytes Relative: 20 %
MCH: 31.2 pg (ref 26.0–34.0)
MCHC: 31.7 g/dL (ref 30.0–36.0)
MCV: 98.5 fL (ref 78.0–100.0)
MONOS PCT: 6 %
Monocytes Absolute: 0.5 10*3/uL (ref 0.1–1.0)
NEUTROS ABS: 5.9 10*3/uL (ref 1.7–7.7)
NEUTROS PCT: 72 %
Platelets: 186 10*3/uL (ref 150–400)
RBC: 3.27 MIL/uL — AB (ref 3.87–5.11)
RDW: 14.6 % (ref 11.5–15.5)
WBC: 8.1 10*3/uL (ref 4.0–10.5)

## 2016-02-11 LAB — I-STAT CHEM 8, ED
BUN: 21 mg/dL — ABNORMAL HIGH (ref 6–20)
CALCIUM ION: 1.13 mmol/L (ref 1.13–1.30)
Chloride: 107 mmol/L (ref 101–111)
Creatinine, Ser: 0.6 mg/dL (ref 0.44–1.00)
GLUCOSE: 87 mg/dL (ref 65–99)
HCT: 36 % (ref 36.0–46.0)
HEMOGLOBIN: 12.2 g/dL (ref 12.0–15.0)
Potassium: 3.7 mmol/L (ref 3.5–5.1)
Sodium: 144 mmol/L (ref 135–145)
TCO2: 26 mmol/L (ref 0–100)

## 2016-02-11 LAB — COMPREHENSIVE METABOLIC PANEL
ALBUMIN: 3.3 g/dL — AB (ref 3.5–5.0)
ALK PHOS: 69 U/L (ref 38–126)
ALT: 35 U/L (ref 14–54)
ANION GAP: 12 (ref 5–15)
AST: 43 U/L — ABNORMAL HIGH (ref 15–41)
BILIRUBIN TOTAL: 1 mg/dL (ref 0.3–1.2)
BUN: 12 mg/dL (ref 6–20)
CALCIUM: 8.5 mg/dL — AB (ref 8.9–10.3)
CO2: 25 mmol/L (ref 22–32)
CREATININE: 0.64 mg/dL (ref 0.44–1.00)
Chloride: 99 mmol/L — ABNORMAL LOW (ref 101–111)
GFR calc non Af Amer: >60 mL/min (ref >60)
GLUCOSE: 92 mg/dL (ref 65–99)
Potassium: 2.8 mmol/L — ABNORMAL LOW (ref 3.5–5.1)
Sodium: 136 mmol/L (ref 135–145)
TOTAL PROTEIN: 6.6 g/dL (ref 6.5–8.1)

## 2016-02-11 LAB — URINALYSIS, ROUTINE W REFLEX MICROSCOPIC
BILIRUBIN URINE: NEGATIVE
Glucose, UA: NEGATIVE mg/dL
Hgb urine dipstick: NEGATIVE
KETONES UR: NEGATIVE mg/dL
Leukocytes, UA: NEGATIVE
NITRITE: NEGATIVE
Protein, ur: NEGATIVE mg/dL
SPECIFIC GRAVITY, URINE: 1.013 (ref 1.005–1.030)
pH: 6 (ref 5.0–8.0)

## 2016-02-11 LAB — RETICULOCYTES
RBC.: 3.27 MIL/uL — ABNORMAL LOW (ref 3.87–5.11)
RETIC CT PCT: 2.2 % (ref 0.4–3.1)
Retic Count, Absolute: 71.9 10*3/uL (ref 19.0–186.0)

## 2016-02-11 LAB — IRON AND TIBC
Iron: 18 ug/dL — ABNORMAL LOW (ref 28–170)
SATURATION RATIOS: 8 % — AB (ref 10.4–31.8)
TIBC: 238 ug/dL — AB (ref 250–450)
UIBC: 220 ug/dL

## 2016-02-11 LAB — INFLUENZA PANEL BY PCR (TYPE A & B)
H1N1FLUPCR: NOT DETECTED
INFLBPCR: NEGATIVE
Influenza A By PCR: NEGATIVE

## 2016-02-11 LAB — MAGNESIUM: Magnesium: 1.7 mg/dL (ref 1.7–2.4)

## 2016-02-11 LAB — PHOSPHORUS: Phosphorus: 2.2 mg/dL — ABNORMAL LOW (ref 2.5–4.6)

## 2016-02-11 LAB — VITAMIN B12: VITAMIN B 12: 852 pg/mL (ref 180–914)

## 2016-02-11 LAB — FERRITIN: FERRITIN: 162 ng/mL (ref 11–307)

## 2016-02-11 LAB — FOLATE: Folate: 22.4 ng/mL (ref >5.9)

## 2016-02-11 LAB — MRSA PCR SCREENING: MRSA by PCR: NEGATIVE

## 2016-02-11 LAB — STREP PNEUMONIAE URINARY ANTIGEN: Strep Pneumo Urinary Antigen: NEGATIVE

## 2016-02-11 MED ORDER — ENOXAPARIN SODIUM 40 MG/0.4ML ~~LOC~~ SOLN
40.0000 mg | SUBCUTANEOUS | Status: DC
  Administered 2016-02-11 – 2016-02-16 (×6): 40 mg via SUBCUTANEOUS
  Filled 2016-02-11 (×6): qty 0.4

## 2016-02-11 MED ORDER — DEXTROSE 5 % IV SOLN
1.0000 g | INTRAVENOUS | Status: DC
  Administered 2016-02-11 – 2016-02-15 (×5): 1 g via INTRAVENOUS
  Filled 2016-02-11 (×6): qty 1

## 2016-02-11 MED ORDER — ALBUTEROL SULFATE (2.5 MG/3ML) 0.083% IN NEBU
2.5000 mg | INHALATION_SOLUTION | Freq: Four times a day (QID) | RESPIRATORY_TRACT | Status: DC
  Administered 2016-02-11 (×3): 2.5 mg via RESPIRATORY_TRACT
  Filled 2016-02-11 (×3): qty 3

## 2016-02-11 MED ORDER — HYDRALAZINE HCL 25 MG PO TABS
75.0000 mg | ORAL_TABLET | Freq: Three times a day (TID) | ORAL | Status: DC
  Administered 2016-02-11 – 2016-02-16 (×14): 75 mg via ORAL
  Filled 2016-02-11 (×16): qty 1

## 2016-02-11 MED ORDER — SODIUM CHLORIDE 0.9 % IV SOLN: 250.0000 mL | INTRAVENOUS | Status: DC | PRN

## 2016-02-11 MED ORDER — ACETAMINOPHEN 325 MG PO TABS
650.0000 mg | ORAL_TABLET | Freq: Four times a day (QID) | ORAL | Status: DC | PRN
  Administered 2016-02-11 – 2016-02-15 (×3): 650 mg via ORAL
  Filled 2016-02-11 (×3): qty 2

## 2016-02-11 MED ORDER — POLYVINYL ALCOHOL 1.4 % OP SOLN
1.0000 [drp] | Freq: Three times a day (TID) | OPHTHALMIC | Status: DC
  Administered 2016-02-11 – 2016-02-16 (×13): 1 [drp] via OPHTHALMIC
  Filled 2016-02-11: qty 15

## 2016-02-11 MED ORDER — SODIUM CHLORIDE 0.9% FLUSH
3.0000 mL | Freq: Two times a day (BID) | INTRAVENOUS | Status: DC
  Administered 2016-02-11 – 2016-02-13 (×5): 3 mL via INTRAVENOUS

## 2016-02-11 MED ORDER — POLYETHYLENE GLYCOL 3350 17 G PO PACK
17.0000 g | PACK | Freq: Every day | ORAL | Status: DC
  Administered 2016-02-14 – 2016-02-16 (×3): 17 g via ORAL
  Filled 2016-02-11 (×4): qty 1

## 2016-02-11 MED ORDER — SODIUM CHLORIDE 0.9% FLUSH: 3.0000 mL | INTRAVENOUS | Status: DC | PRN

## 2016-02-11 MED ORDER — POTASSIUM CHLORIDE CRYS ER 20 MEQ PO TBCR
40.0000 meq | EXTENDED_RELEASE_TABLET | Freq: Once | ORAL | Status: AC
  Administered 2016-02-11: 40 meq via ORAL
  Filled 2016-02-11: qty 2

## 2016-02-11 MED ORDER — DIPHENHYDRAMINE-ZINC ACETATE 2-0.1 % EX CREA
TOPICAL_CREAM | Freq: Two times a day (BID) | CUTANEOUS | Status: DC | PRN
  Administered 2016-02-11: 1 via TOPICAL
  Administered 2016-02-12: 10:00:00 via TOPICAL
  Filled 2016-02-11: qty 28

## 2016-02-11 MED ORDER — ONDANSETRON HCL 4 MG PO TABS: 4.0000 mg | ORAL_TABLET | Freq: Four times a day (QID) | ORAL | Status: DC | PRN

## 2016-02-11 MED ORDER — LORAZEPAM 0.5 MG PO TABS
0.5000 mg | ORAL_TABLET | Freq: Four times a day (QID) | ORAL | Status: DC | PRN
  Administered 2016-02-11 – 2016-02-13 (×2): 0.5 mg via ORAL
  Filled 2016-02-11 (×2): qty 1

## 2016-02-11 MED ORDER — VANCOMYCIN HCL IN DEXTROSE 1-5 GM/200ML-% IV SOLN
1000.0000 mg | Freq: Once | INTRAVENOUS | Status: AC
  Administered 2016-02-11: 1000 mg via INTRAVENOUS
  Filled 2016-02-11: qty 200

## 2016-02-11 MED ORDER — SODIUM CHLORIDE 0.9% FLUSH
3.0000 mL | Freq: Two times a day (BID) | INTRAVENOUS | Status: DC
  Administered 2016-02-11 – 2016-02-15 (×5): 3 mL via INTRAVENOUS

## 2016-02-11 MED ORDER — ZOLPIDEM TARTRATE 5 MG PO TABS: 5.0000 mg | ORAL_TABLET | Freq: Every evening | ORAL | Status: DC | PRN

## 2016-02-11 MED ORDER — CEFTRIAXONE SODIUM 1 G IJ SOLR
1.0000 g | INTRAMUSCULAR | Status: DC
  Filled 2016-02-11: qty 10

## 2016-02-11 MED ORDER — IPRATROPIUM BROMIDE 0.02 % IN SOLN
0.5000 mg | Freq: Four times a day (QID) | RESPIRATORY_TRACT | Status: DC
  Administered 2016-02-11 (×3): 0.5 mg via RESPIRATORY_TRACT
  Filled 2016-02-11 (×3): qty 2.5

## 2016-02-11 MED ORDER — VANCOMYCIN HCL IN DEXTROSE 750-5 MG/150ML-% IV SOLN
750.0000 mg | INTRAVENOUS | Status: DC
  Administered 2016-02-12: 750 mg via INTRAVENOUS
  Filled 2016-02-11 (×2): qty 150

## 2016-02-11 MED ORDER — MELATONIN 5 MG PO TABS: 5.0000 mg | ORAL_TABLET | Freq: Every day | ORAL | Status: DC

## 2016-02-11 MED ORDER — POTASSIUM & SODIUM PHOSPHATES 280-160-250 MG PO PACK
1.0000 | PACK | Freq: Three times a day (TID) | ORAL | Status: AC
  Administered 2016-02-11 – 2016-02-12 (×3): 1 via ORAL
  Filled 2016-02-11 (×3): qty 1

## 2016-02-11 MED ORDER — IPRATROPIUM-ALBUTEROL 0.5-2.5 (3) MG/3ML IN SOLN
3.0000 mL | Freq: Four times a day (QID) | RESPIRATORY_TRACT | Status: DC
  Administered 2016-02-12 (×3): 3 mL via RESPIRATORY_TRACT
  Filled 2016-02-11 (×3): qty 3

## 2016-02-11 MED ORDER — DEXTROSE 5 % IV SOLN
500.0000 mg | INTRAVENOUS | Status: DC
  Filled 2016-02-11: qty 500

## 2016-02-11 MED ORDER — VERAPAMIL HCL ER 120 MG PO TBCR
120.0000 mg | EXTENDED_RELEASE_TABLET | Freq: Every day | ORAL | Status: DC
  Administered 2016-02-11 – 2016-02-16 (×6): 120 mg via ORAL
  Filled 2016-02-11 (×11): qty 1

## 2016-02-11 MED ORDER — CARBOXYMETHYLCELLULOSE SODIUM 1 % OP SOLN: 1.0000 [drp] | Freq: Every day | OPHTHALMIC | Status: DC

## 2016-02-11 MED ORDER — GUAIFENESIN ER 600 MG PO TB12
600.0000 mg | ORAL_TABLET | Freq: Two times a day (BID) | ORAL | Status: DC
  Administered 2016-02-11 – 2016-02-13 (×5): 600 mg via ORAL
  Filled 2016-02-11 (×6): qty 1

## 2016-02-11 MED ORDER — POLYETHYL GLYCOL-PROPYL GLYCOL 0.4-0.3 % OP SOLN: 1.0000 [drp] | Freq: Three times a day (TID) | OPHTHALMIC | Status: DC

## 2016-02-11 MED ORDER — CLOPIDOGREL BISULFATE 75 MG PO TABS
75.0000 mg | ORAL_TABLET | Freq: Every day | ORAL | Status: DC
  Administered 2016-02-11 – 2016-02-16 (×6): 75 mg via ORAL
  Filled 2016-02-11 (×6): qty 1

## 2016-02-11 MED ORDER — BACID PO TABS
1.0000 | ORAL_TABLET | Freq: Two times a day (BID) | ORAL | Status: DC
  Administered 2016-02-11 – 2016-02-16 (×11): 1 via ORAL
  Filled 2016-02-11 (×17): qty 1

## 2016-02-11 MED ORDER — ONDANSETRON HCL 4 MG/2ML IJ SOLN: 4.0000 mg | Freq: Four times a day (QID) | INTRAMUSCULAR | Status: DC | PRN

## 2016-02-11 MED ORDER — POTASSIUM CHLORIDE CRYS ER 20 MEQ PO TBCR
20.0000 meq | EXTENDED_RELEASE_TABLET | Freq: Every day | ORAL | Status: DC
  Administered 2016-02-11 – 2016-02-13 (×3): 20 meq via ORAL
  Filled 2016-02-11 (×4): qty 1

## 2016-02-11 MED ORDER — FUROSEMIDE 10 MG/ML IJ SOLN
20.0000 mg | Freq: Once | INTRAMUSCULAR | Status: AC
  Administered 2016-02-11: 20 mg via INTRAVENOUS
  Filled 2016-02-11: qty 2

## 2016-02-11 MED ORDER — ASPIRIN EC 325 MG PO TBEC
325.0000 mg | DELAYED_RELEASE_TABLET | Freq: Every day | ORAL | Status: DC
  Administered 2016-02-11 – 2016-02-16 (×6): 325 mg via ORAL
  Filled 2016-02-11 (×6): qty 1

## 2016-02-11 MED ORDER — PANTOPRAZOLE SODIUM 40 MG PO TBEC
40.0000 mg | DELAYED_RELEASE_TABLET | Freq: Every day | ORAL | Status: DC
  Administered 2016-02-11 – 2016-02-16 (×6): 40 mg via ORAL
  Filled 2016-02-11 (×7): qty 1

## 2016-02-11 MED ORDER — CITALOPRAM HYDROBROMIDE 20 MG PO TABS
10.0000 mg | ORAL_TABLET | Freq: Every day | ORAL | Status: DC
  Administered 2016-02-11 – 2016-02-16 (×6): 10 mg via ORAL
  Filled 2016-02-11 (×6): qty 1

## 2016-02-11 MED ORDER — TAMSULOSIN HCL 0.4 MG PO CAPS
0.4000 mg | ORAL_CAPSULE | Freq: Every day | ORAL | Status: DC
  Administered 2016-02-11 – 2016-02-15 (×5): 0.4 mg via ORAL
  Filled 2016-02-11 (×5): qty 1

## 2016-02-11 NOTE — Progress Notes (Signed)
Patient seen and examined this morning, admitted overnight by Dr. Robb Matar. She is an 82 year old lady with history of anxiety, insomnia, osteoarthritis, anemia, prior CVA as well as dementia, brought from rehabilitation Center with fever or chills cough with yellow sputum for a couple weeks failing oral Augmentin.   HCAP - her chest x-ray is concern for pneumonia on the left, discontinue ceftriaxone and azithromycin, she comes from a rehabilitation center and will treat as healthcare associated pneumonia with vancomycin and cefepime.cultures obtained and pending. She is still febrile this morning - influenza panel negative  Anemia - Anemia panel pending - no clinical evidence of bleed  Hypokalemia / hypophosphatemia  - likely due to poor by mouth intake, replete  Osteoarthritis - Continue Tylenol as needed.  Aortic stenosis  Acute on chronic diastolic CHF (congestive heart failure) (HCC) - chest x-ray with mild evidence of fluid overload, she has crackles on exam, will give IV Lasix 1  Essential hypertension - Continue hydralazine.  DVT PPx Lovenox  Costin M. Elvera Lennox, MD [facility] [phone]

## 2016-02-11 NOTE — Progress Notes (Signed)
Gherghe C MB paged about pt oral temp of 101.9 Tylenol 650mg  given at 8:36am for headache. Will continue to monitor

## 2016-02-11 NOTE — Evaluation (Signed)
Clinical/Bedside Swallow Evaluation Patient Details  Name: Monica Blankenship MRN: [id number] Date of Birth: Oct 16, 1929  Today's Date: 02/11/2016 Time: SLP Start Time (ACUTE ONLY): 1500 SLP Stop Time (ACUTE ONLY): 1521 SLP Time Calculation (min) (ACUTE ONLY): 21 min  Past Medical History:  Past Medical History  Diagnosis Date  . Allergy   . Anxiety   . Insomnia   . Osteoarthritis   . Anemia   . Obesity   . Diastolic dysfunction   . CKD (chronic kidney disease)   . Mitral stenosis     with moderate MR on echo 4/14  . Aortic stenosis     mild by echo 4/14  . Aortic regurgitation     mild to moderate by echo 4/14  . HTN (hypertension)   . Stroke (HCC)   . Vascular dementia    Past Surgical History:  Past Surgical History  Procedure Laterality Date  . Eye surgery    . Abdominal hysterectomy    . Appendectomy    . Cataract extraction, bilateral    . Tee without cardioversion N/A 04/13/2014    Procedure: TRANSESOPHAGEAL ECHOCARDIOGRAM (TEE);  Surgeon: Vesta Mixer, MD;  Location: [facility] ENDOSCOPY;  Service: Cardiovascular;  Laterality: N/A;   HPI:  82 y.o. female with a past medical history of anxiety, insomnia, osteoarthritis, anemia, hypertension, CVA, mitral stenosis, aortic stenosis, aortic regurgitation who is brought in from [facility] rehabilitation center with dx of CAP, acute on chronic diastolic CHF .  Pt is known to SLP services from prior admissions for CVA; has had several swallow evals, the last of which was 12/29/14,with recs to continue dysphagia 3 diet, thin liquids.    Assessment / Plan / Recommendation Clinical Impression  Pt presented with functional oropharyngeal swallow with adequate, though slowed, mastication, consistent swallow response, and no overt s/s of aspiration during the assessment.  After completing the evaluation, pt began coughing for several minutes in duration. Difficult to ascertain if coughing was related to pna or a delayed response to PO  consumption, potential aspiration.  Recommend downgrading diet to dysphagia 3, thin liquids, crush meds due to oral pocketing per RN.  SLP will f/u briefly to ensure PO toleration and safety given hx of dysphagia and PNA dx.    Aspiration Risk  Mild aspiration risk    Diet Recommendation     Medication Administration: Crushed with puree    Other  Recommendations Oral Care Recommendations: Oral care BID   Follow up Recommendations  None    Frequency and Duration min 1 x/week  1 week       Prognosis Prognosis for Safe Diet Advancement: Fair      Swallow Study   General Date of Onset: 02/10/16 HPI: 82 y.o. female with a past medical history of anxiety, insomnia, osteoarthritis, anemia, hypertension, CVA, mitral stenosis, aortic stenosis, aortic regurgitation who is brought in from [facility] rehabilitation center with dx of CAP, acute on chronic diastolic CHF .  Pt is known to SLP services from prior admissions for CVA; has had several swallow evals, the last of which was 12/29/14,with recs to continue dysphagia 3 diet, thin liquids.  Type of Study: Bedside Swallow Evaluation Previous Swallow Assessment: see hx Diet Prior to this Study: Regular;Thin liquids Temperature Spikes Noted: Yes Respiratory Status: Nasal cannula History of Recent Intubation: No Behavior/Cognition: Alert;Cooperative Oral Cavity Assessment: Within Functional Limits Oral Care Completed by SLP: No Oral Cavity - Dentition: Dentures, top;Dentures, bottom Vision: Functional for self-feeding Self-Feeding Abilities: Able to  feed self Patient Positioning: Upright in bed Baseline Vocal Quality: Normal Volitional Cough: Strong Volitional Swallow: Able to elicit    Oral/Motor/Sensory Function Overall Oral Motor/Sensory Function: Mild impairment (mild left asymmetry)   Ice Chips Ice chips: Within functional limits   Thin Liquid Thin Liquid: Within functional limits Presentation: Cup    Nectar Thick Nectar Thick  Liquid: Not tested   Honey Thick Honey Thick Liquid: Not tested   Puree Puree: Within functional limits Presentation: Self Fed;Spoon   Solid   GO   Solid: Within functional limits Presentation: Self Fed        Blenda Mounts Laurice 02/11/2016,3:28 PM

## 2016-02-11 NOTE — Progress Notes (Signed)
Pharmacy Antibiotic Note  Monica Blankenship is a 82 y.o. female admitted on 02/10/2016 with pneumonia.  Pharmacy has been consulted for vancomycin and cefepime dosing.    Plan: Cefepime 1g IV q24 hours Vancomycin 1g IV now then 750 q24 hours  Height: 5\' 2"  (157.5 cm) Weight: 132 lb 0.9 oz (59.9 kg) IBW/kg (Calculated) : 50.1  Temp (24hrs), Avg:99.3 F (37.4 C), Min:98 F (36.7 C), Max:101.9 F (38.8 C)   Recent Labs Lab 02/10/16 2223 02/10/16 2236 02/11/16 0008 02/11/16 1033  WBC 9.2  --   --  8.1  CREATININE 0.72  --  0.60  --   LATICACIDVEN  --  1.77  --   --     Estimated Creatinine Clearance: 41.4 mL/min (by C-G formula based on Cr of 0.6).    Allergies  Allergen Reactions  . Fentanyl Other (See Comments)    confusion    Antimicrobials this admission: Vancomycin 3/11 >>  Cefepime 3/11>>   Dose adjustments this admission:   Microbiology results: 3/10 BCx:  3/11 UCx:   3/11 MRSA PCR: neg  Thank you for allowing pharmacy to be a part of this patient's care. 5/11 PharmD., BCPS Clinical Pharmacist Pager [phone] 02/11/2016 11:37 AM

## 2016-02-11 NOTE — H&P (Signed)
[facility] History and Physical  ANY MCNEICE JEH:[id number] DOB: 1929-01-19 DOA: 02/10/2016  Referring physician: Gwyneth Sprout, MD PCP: Georgann Housekeeper, MD   Chief Complaint: Bronchitis.  HPI: LADY WISHAM is a 82 y.o. female with a past medical history of anxiety, insomnia, osteoarthritis, anemia, hypertension, CVA, mitral stenosis, aortic stenosis, aortic regurgitation who is brought in from [facility] rehabilitation center with complaints of shortness of breath, fever, chills, fatigue, productive cough of yellowish sputum for 2 weeks which has not responded to several days of oral Augmentin.  Per patient, she is started having symptoms of a sore throat, that subsequently progressed to a cough that eventually became productive and has been feeling progressively shorter of breath, wheezy, fatigued, decreased appetite. She denies chest pain, palpitations, dizziness, PND, orthopnea or pitting edema lower extremities. She denies abdominal pain, nausea, emesis, diarrhea or GU symptoms.  When seen, she is states that she is now feeling a little better after antibiotics, oxygen and bronchodilators were given. She was in no acute distress.  Review of Systems:  Constitutional:  Positive Fevers, chills, fatigue.  No weight loss, night sweats HEENT:  No headaches, Difficulty swallowing,Tooth/dental problems,Sore throat,  No sneezing, itching, ear ache, nasal congestion, post nasal drip,  Cardio-vascular:  No chest pain, Orthopnea, PND, swelling in lower extremities, anasarca, dizziness, palpitations  GI:  Positive for loss of appetite  No heartburn, indigestion, abdominal pain, nausea, vomiting, diarrhea, change in bowel habits, Resp:  Positive dyspnea, productive cough, wheezing. No hemoptysis. Skin:  no rash or lesions.  GU:  no dysuria, change in color of urine, no urgency or frequency. No flank pain.  Musculoskeletal:  Frequent arthralgias and decreased range of  motion. Left-sided hemiparesis. No back pain.  Psych:  No change in mood or affect. No depression or anxiety. No memory loss.   Past Medical History  Diagnosis Date  . Allergy   . Anxiety   . Insomnia   . Osteoarthritis   . Anemia   . Obesity   . Diastolic dysfunction   . CKD (chronic kidney disease)   . Mitral stenosis     with moderate MR on echo 4/14  . Aortic stenosis     mild by echo 4/14  . Aortic regurgitation     mild to moderate by echo 4/14  . HTN (hypertension)   . Stroke (HCC)   . Vascular dementia    Past Surgical History  Procedure Laterality Date  . Eye surgery    . Abdominal hysterectomy    . Appendectomy    . Cataract extraction, bilateral    . Tee without cardioversion N/A 04/13/2014    Procedure: TRANSESOPHAGEAL ECHOCARDIOGRAM (TEE);  Surgeon: Vesta Mixer, MD;  Location: [facility] ENDOSCOPY;  Service: Cardiovascular;  Laterality: N/A;   Social History:  reports that she has quit smoking. She has never used smokeless tobacco. She reports that she does not drink alcohol or use illicit drugs.  Allergies  Allergen Reactions  . Fentanyl Other (See Comments)    confusion    Family History  Problem Relation Age of Onset  . Hypertension Mother   . CVA Mother     Prior to Admission medications   Medication Sig Start Date End Date Taking? Authorizing Provider  acetaminophen (TYLENOL) 325 MG tablet Take 650 mg by mouth every 8 (eight) hours. Scheduled 6a, 2p, 10p   Yes Historical Provider, MD  amoxicillin-clavulanate (AUGMENTIN) 875-125 MG tablet Take 1 tablet by mouth 2 (two) times daily.  Yes Historical Provider, MD  aspirin 81 MG chewable tablet Chew 4 tablets (324 mg total) by mouth daily. 11/17/14  Yes Rhetta Mura, MD  carboxymethylcellulose (REFRESH) 1 % ophthalmic solution Place 1 drop into both eyes at bedtime.    Yes Historical Provider, MD  citalopram (CELEXA) 20 MG tablet Take 10 mg by mouth daily.    Yes Historical Provider, MD    clopidogrel (PLAVIX) 75 MG tablet Take 1 tablet (75 mg total) by mouth daily. 11/17/14  Yes Rhetta Mura, MD  Eyelid Cleansers (OCUSOFT EYELID CLEANSING) PADS Apply 1 application topically See admin instructions. Use to clean eyelids twice daily and as needed for matted eyes   Yes Historical Provider, MD  guaiFENesin (MUCINEX) 600 MG 12 hr tablet Take 600 mg by mouth 2 (two) times daily.   Yes Historical Provider, MD  hydrALAZINE (APRESOLINE) 25 MG tablet Take 75 mg by mouth 3 (three) times daily. Hold for SBP <130  6am, 2pm, 10pm   Yes Historical Provider, MD  ibuprofen (ADVIL,MOTRIN) 600 MG tablet Take 600 mg by mouth 3 (three) times daily.   Yes Historical Provider, MD  lactobacillus acidophilus (BACID) TABS tablet Take 1 tablet by mouth 2 (two) times daily.    Yes Historical Provider, MD  LORazepam (ATIVAN) 0.5 MG tablet Take 1 tablet (0.5 mg total) by mouth every 6 (six) hours as needed for anxiety. Patient taking differently: Take 0.5 mg by mouth 2 (two) times daily. 9am, 8pm 03/03/15  Yes Marinda Elk, MD  Melatonin 5 MG TABS Take 5 mg by mouth at bedtime.   Yes Historical Provider, MD  Polyethyl Glycol-Propyl Glycol (SYSTANE) 0.4-0.3 % SOLN Place 1 drop into both eyes 3 (three) times daily. 9am, 1pm, 5pm   Yes Historical Provider, MD  polyethylene glycol (MIRALAX / GLYCOLAX) packet Take 17 g by mouth daily. Mix in 8 oz fluid and drink   Yes Historical Provider, MD  potassium chloride SA (K-DUR,KLOR-CON) 20 MEQ tablet Take 20 mEq by mouth daily.   Yes Historical Provider, MD  predniSONE (DELTASONE) 50 MG tablet Take 50 mg by mouth daily with breakfast. 02/07/16 02/12/16 Yes Historical Provider, MD  tamsulosin (FLOMAX) 0.4 MG CAPS capsule Take 1 capsule (0.4 mg total) by mouth daily. Patient taking differently: Take 0.4 mg by mouth at bedtime.  12/29/14  Yes Maryann Mikhail, DO  verapamil (CALAN-SR) 120 MG CR tablet Take 120 mg by mouth daily.   Yes Historical Provider, MD    Physical Exam: Filed Vitals:   02/10/16 2345 02/11/16 0000 02/11/16 0015 02/11/16 0030  BP: 160/73 156/75 166/73 166/82  Pulse: 71 71 75 72  Temp:      TempSrc:      Resp: 19 23 17 22   SpO2: 99% 99% 99% 100%    Wt Readings from Last 3 Encounters:  03/03/15 63.2 kg (139 lb 5.3 oz)  12/28/14 77.111 kg (170 lb)  12/22/14 72.6 kg (160 lb 0.9 oz)    General:  Looks ill, but is in no acute distress. Eyes: PERRL, normal lids, irises & conjunctiva ENT: grossly normal hearing, lips & tongue. Oral mucosa is dry. Throat was clear. Neck: no LAD, masses or thyromegaly Cardiovascular: RRR, no m/r/g. No LE edema. Telemetry: SR, no arrhythmias  Respiratory: Decreased breath sounds on bases with bilateral wheezing and rhonchi.                      No accessory muscle use. Abdomen: soft, ntnd Skin: no rash  or induration seen on limited exam Musculoskeletal: grossly normal tone BUE/BLE Psychiatric: grossly normal mood and affect, speech fluent and appropriate Neurologic: Awake, alert, oriented 2, partially oriented to time , left sided hemiparesis 3.5 /5          Labs on Admission:  Basic Metabolic Panel:  Recent Labs Lab 02/10/16 2223 02/11/16 0008  NA 142 144  K 3.7 3.7  CL 106 107  CO2 23  --   GLUCOSE 96 87  BUN 17 21*  CREATININE 0.72 0.60  CALCIUM 9.2  --    CBC:  Recent Labs Lab 02/10/16 2223 02/11/16 0008  WBC 9.2  --   HGB 11.2* 12.2  HCT 33.6* 36.0  MCV 100.0  --   PLT 189  --    BNP (last 3 results)  Recent Labs  02/10/16 2241  BNP 1260.9*    Radiological Exams on Admission: Dg Chest 2 View  02/10/2016  CLINICAL DATA:  Cough, weakness x2 days EXAM: CHEST  2 VIEW COMPARISON:  02/26/2015 FINDINGS: Mild patchy left basilar opacity, atelectasis versus pneumonia. Cardiomegaly with pulmonary vascular congestion. No frank interstitial edema. No pleural effusion or pneumothorax. Degenerative changes of the visualized thoracolumbar spine. IMPRESSION: Mild  patchy left basilar opacity, atelectasis versus pneumonia. Cardiomegaly with pulmonary vascular congestion. No frank interstitial edema. Electronically Signed   By: Charline Bills M.D.   On: 02/10/2016 21:35  Echocardiogram 12/15/2014  ------------------------------------------------------------------- LV EF: 60%  ------------------------------------------------------------------- Indications:   CHF - 428.0.  ------------------------------------------------------------------- History:  PMH: Former Smoker, Altered Mental Status, SBE, Sepsis, Bradycardia, PHTN. Stroke. Risk factors: Hypertension.  ------------------------------------------------------------------- Study Conclusions  - Left ventricle: The cavity size was normal. Wall thickness was increased in a pattern of moderate LVH. The estimated ejection fraction was 60%. Wall motion was normal; there were no regional wall motion abnormalities. - Aortic valve: Moderately thickened leaflets. There was very mild stenosis. Mean gradient (S): 11 mm Hg. Peak gradient (S): 19 mm Hg. - Mitral valve: There is thickening of the leaflets with tethering of the tips. There is mitral stenosis. The doppler data is difficult to assess. The most reliable data is probably the mean gradient of and peak gradient of . The data is further affected by the presence of significant MR. Compared to the data from 11/16/2014 study, I suspect there is no significant change. This is significant mitral stenosis. - Left atrium: The atrium was moderately to severely dilated. - Right ventricle: The cavity size was moderately dilated. Systolic function was mildly reduced. - Right atrium: The atrium was moderately to severely dilated.  Impressions:  - Unfortunately, a good TR signal could not be obtained. Therefore, pulmonary hypertension can not be assessed.  EKG: Independently reviewed. Vent. rate 73 BPM PR  interval 157 ms QRS duration 135 ms QT/QTc 440/485 ms P-R-T axes -44 -45 79 Sinus rhythm Left bundle branch block No significant change since last tracing  Assessment/Plan Principal Problem:   CAP (community acquired pneumonia) Admit to telemetry. Continue supplemental oxygen. Bronchodilators as needed. Continue IV antibiotics. Mucinex 600 mg by mouth twice a day. Follow-up blood cultures and sensitivity. Check legionella and strep pneumoniae urinary antigens.  Active Problems:   Anemia Check anemia panel Monitor hematocrit and hemoglobin.    Osteoarthritis Continue Tylenol as needed.    Aortic stenosis   Chronic diastolic CHF (congestive heart failure) (HCC) No signs of decompensation at this time. Continue hydralazine 75 mg by mouth 3 times a day.    Essential hypertension Continue hydralazine.  Monitor blood pressure.    Code Status: Full code. DVT Prophylaxis: Lovenox SQ. Family Communication:  Disposition Plan: Admit for IV antibiotic therapy for a few days.   Time spent: Over 70 minutes were spent in the process of this admission.   Bobette Mo, M.D. [facility] Pager [phone].

## 2016-02-12 LAB — BASIC METABOLIC PANEL
ANION GAP: 14 (ref 5–15)
BUN: 9 mg/dL (ref 6–20)
CALCIUM: 9 mg/dL (ref 8.9–10.3)
CO2: 24 mmol/L (ref 22–32)
Chloride: 106 mmol/L (ref 101–111)
Creatinine, Ser: 0.67 mg/dL (ref 0.44–1.00)
Glucose, Bld: 93 mg/dL (ref 65–99)
POTASSIUM: 3.6 mmol/L (ref 3.5–5.1)
Sodium: 144 mmol/L (ref 135–145)

## 2016-02-12 LAB — URINE CULTURE: Culture: NO GROWTH

## 2016-02-12 LAB — CBC
HCT: 34.9 % — ABNORMAL LOW (ref 36.0–46.0)
HEMOGLOBIN: 11.2 g/dL — AB (ref 12.0–15.0)
MCH: 31.5 pg (ref 26.0–34.0)
MCHC: 32.1 g/dL (ref 30.0–36.0)
MCV: 98 fL (ref 78.0–100.0)
Platelets: 193 10*3/uL (ref 150–400)
RBC: 3.56 MIL/uL — ABNORMAL LOW (ref 3.87–5.11)
RDW: 14.6 % (ref 11.5–15.5)
WBC: 6.8 10*3/uL (ref 4.0–10.5)

## 2016-02-12 MED ORDER — IPRATROPIUM-ALBUTEROL 0.5-2.5 (3) MG/3ML IN SOLN
3.0000 mL | Freq: Three times a day (TID) | RESPIRATORY_TRACT | Status: DC
  Administered 2016-02-12 – 2016-02-14 (×5): 3 mL via RESPIRATORY_TRACT
  Filled 2016-02-12 (×5): qty 3

## 2016-02-12 NOTE — Progress Notes (Signed)
PROGRESS NOTE  Monica Blankenship [id number]:[id number] DOB: [birth year] DOA: 02/10/2016 PCP: Georgann Housekeeper, MD Outpatient Specialists:    LOS: 1 day   Brief Narrative: 82 year old lady with history of anxiety, insomnia, osteoarthritis, anemia, prior CVA as well as dementia, brought from rehabilitation Center with fever or chills cough with yellow sputum for a couple weeks failing oral Augmentin.  Assessment & Plan: Principal Problem:   CAP (community acquired pneumonia) Active Problems:   Anemia   Osteoarthritis   Aortic stenosis   Chronic diastolic CHF (congestive heart failure) (HCC)   Essential hypertension   HCAP - she comes from a rehabilitation center and will treat as healthcare associated pneumonia with vancomycin and cefepime. - cultures obtained and pending - influenza panel negative - fever curve improved, afebrile this morning. Last fever 3/11 am  Anemia - no clinical evidence of bleed  Hypokalemia / hypophosphatemia  - likely due to poor by mouth intake, replete - improved today, continue to monitor  Osteoarthritis - Continue Tylenol as needed.  Aortic stenosis  Acute on chronic diastolic CHF (congestive heart failure) (HCC) - chest x-ray with mild evidence of fluid overload, she has crackles on exam, s/p IV Lasix 1 on 3/11 - no Lasix today, no crackles on exam  Essential hypertension - Continue hydralazine.  DVT prophylaxis: Lovenox Code Status: Full Family Communication: d/w daughter [name] [facility]) bedside Disposition Plan: SNF 2-3 days Barriers for discharge: IV antibiotics  Consultants:   None   Procedures:   None   Antimicrobials:  Vancomycin 3/11 >>  Cefepime 3/11 >>   Subjective: - pleasantly confused, denies chest pain / dyspnea. Receiving a breathing treatment  Objective: Filed Vitals:   02/12/16 0144 02/12/16 0544 02/12/16 0845 02/12/16 1011  BP:  148/57  142/53  Pulse:  86 80 82  Temp:  97.7 F (36.5 C)  98.8 F (37.1 C)    TempSrc:  Oral  Oral  Resp:   15 16  Height:      Weight:  58.5 kg (128 lb 15.5 oz)    SpO2: 98% 96%  97%    Intake/Output Summary (Last 24 hours) at 02/12/16 1126 Last data filed at 02/12/16 0900  Gross per 24 hour  Intake    360 ml  Output      0 ml  Net    360 ml   Filed Weights   02/11/16 0309 02/12/16 0544  Weight: 59.9 kg (132 lb 0.9 oz) 58.5 kg (128 lb 15.5 oz)    Examination: BP 142/53 mmHg  Pulse 82  Temp(Src) 98.8 F (37.1 C) (Oral)  Resp 16  Ht 5\' 2"  (1.575 m)  Wt 58.5 kg (128 lb 15.5 oz)  BMI 23.58 kg/m2  SpO2 97%  GENERAL: NAD  HEENT: head NCAT, no scleral icterus. Pupils round and reactive.   LUNGS: Clear to auscultation. No wheezing or crackles  HEART: Regular rate and rhythm without murmur. 2+ pulses, no JVD, no peripheral edema  ABDOMEN: Soft, nontender, and nondistended. Positive bowel sounds.   EXTREMITIES: Without any cyanosis or clubbing  NEUROLOGIC: Alert and oriented x1 to person only.    Data Reviewed: I have personally reviewed following labs and imaging studies  CBC:  Recent Labs Lab 02/10/16 2223 02/11/16 0008 02/11/16 1033 02/12/16 0325  WBC 9.2  --  8.1 6.8  NEUTROABS  --   --  5.9  --   HGB 11.2* 12.2 10.2* 11.2*  HCT 33.6* 36.0 32.2* 34.9*  MCV 100.0  --  98.5 98.0  PLT 189  --  186 193   Basic Metabolic Panel:  Recent Labs Lab 02/10/16 2223 02/11/16 0008 02/11/16 1033 02/12/16 0325  NA 142 144 136 144  K 3.7 3.7 2.8* 3.6  CL 106 107 99* 106  CO2 23  --  25 24  GLUCOSE 96 87 92 93  BUN 17 21* 12 9  CREATININE 0.72 0.60 0.64 0.67  CALCIUM 9.2  --  8.5* 9.0  MG  --   --  1.7  --   PHOS  --   --  2.2*  --    GFR: Estimated Creatinine Clearance: 41.4 mL/min (by C-G formula based on Cr of 0.67). Liver Function Tests:  Recent Labs Lab 02/11/16 1033  AST 43*  ALT 35  ALKPHOS 69  BILITOT 1.0  PROT 6.6  ALBUMIN 3.3*   No results for input(s): LIPASE, AMYLASE in the last 168 hours. No results for  input(s): AMMONIA in the last 168 hours. Coagulation Profile: No results for input(s): INR, PROTIME in the last 168 hours. Cardiac Enzymes: No results for input(s): CKTOTAL, CKMB, CKMBINDEX, TROPONINI in the last 168 hours. BNP (last 3 results) No results for input(s): PROBNP in the last 8760 hours. HbA1C: No results for input(s): HGBA1C in the last 72 hours. CBG: No results for input(s): GLUCAP in the last 168 hours. Lipid Profile: No results for input(s): CHOL, HDL, LDLCALC, TRIG, CHOLHDL, LDLDIRECT in the last 72 hours. Thyroid Function Tests: No results for input(s): TSH, T4TOTAL, FREET4, T3FREE, THYROIDAB in the last 72 hours. Anemia Panel:  Recent Labs  02/11/16 1033  VITAMINB12 852  FOLATE 22.4  FERRITIN 162  TIBC 238*  IRON 18*  RETICCTPCT 2.2   Urine analysis:    Component Value Date/Time   COLORURINE YELLOW 02/11/2016 1409   APPEARANCEUR CLEAR 02/11/2016 1409   LABSPEC 1.013 02/11/2016 1409   PHURINE 6.0 02/11/2016 1409   GLUCOSEU NEGATIVE 02/11/2016 1409   HGBUR NEGATIVE 02/11/2016 1409   BILIRUBINUR NEGATIVE 02/11/2016 1409   KETONESUR NEGATIVE 02/11/2016 1409   PROTEINUR NEGATIVE 02/11/2016 1409   UROBILINOGEN 1.0 07/23/2015 1809   NITRITE NEGATIVE 02/11/2016 1409   LEUKOCYTESUR NEGATIVE 02/11/2016 1409   Sepsis Labs: Invalid input(s): PROCALCITONIN, LACTICIDVEN  Recent Results (from the past 240 hour(s))  Blood Culture (routine x 2)     Status: None (Preliminary result)   Collection Time: 02/10/16 10:05 PM  Result Value Ref Range Status   Specimen Description BLOOD RIGHT FOREARM  Final   Special Requests IN PEDIATRIC BOTTLE 1CC  Final   Culture NO GROWTH 2 DAYS  Final   Report Status PENDING  Incomplete  Blood Culture (routine x 2)     Status: None (Preliminary result)   Collection Time: 02/10/16 10:21 PM  Result Value Ref Range Status   Specimen Description BLOOD RIGHT HAND  Final   Special Requests BOTTLES DRAWN AEROBIC AND ANAEROBIC 5CC   Final   Culture NO GROWTH 2 DAYS  Final   Report Status PENDING  Incomplete  MRSA PCR Screening     Status: None   Collection Time: 02/11/16  3:21 AM  Result Value Ref Range Status   MRSA by PCR NEGATIVE NEGATIVE Final    Comment:        The GeneXpert MRSA Assay (FDA approved for NASAL specimens only), is one component of a comprehensive MRSA colonization surveillance program. It is not intended to diagnose MRSA infection nor to guide or monitor treatment for MRSA infections.  Radiology Studies: Dg Chest 2 View  02/10/2016  CLINICAL DATA:  Cough, weakness x2 days EXAM: CHEST  2 VIEW COMPARISON:  02/26/2015 FINDINGS: Mild patchy left basilar opacity, atelectasis versus pneumonia. Cardiomegaly with pulmonary vascular congestion. No frank interstitial edema. No pleural effusion or pneumothorax. Degenerative changes of the visualized thoracolumbar spine. IMPRESSION: Mild patchy left basilar opacity, atelectasis versus pneumonia. Cardiomegaly with pulmonary vascular congestion. No frank interstitial edema. Electronically Signed   By: Charline Bills M.D.   On: 02/10/2016 21:35     Scheduled Meds: . aspirin EC  325 mg Oral Daily  . ceFEPime (MAXIPIME) IV  1 g Intravenous Q24H  . citalopram  10 mg Oral Daily  . clopidogrel  75 mg Oral Daily  . enoxaparin (LOVENOX) injection  40 mg Subcutaneous Q24H  . guaiFENesin  600 mg Oral BID  . hydrALAZINE  75 mg Oral 3 times per day  . ipratropium-albuterol  3 mL Nebulization Q6H  . lactobacillus acidophilus  1 tablet Oral BID  . pantoprazole  40 mg Oral Daily  . polyethylene glycol  17 g Oral Daily  . polyvinyl alcohol  1 drop Both Eyes TID  . potassium chloride SA  20 mEq Oral Daily  . sodium chloride flush  3 mL Intravenous Q12H  . sodium chloride flush  3 mL Intravenous Q12H  . tamsulosin  0.4 mg Oral QHS  . vancomycin  750 mg Intravenous Q24H  . verapamil  120 mg Oral Daily   Continuous Infusions:    Time spent: 25  minutes, more than 50% bedside in patient evaluation and returned to room to discuss with POA   Pamella Pert, MD, PhD [facility] Pager [phone]  If 7PM-7AM, please contact night-coverage [email or web address] Password [facility] 02/12/2016, 11:26 AM

## 2016-02-12 NOTE — Evaluation (Signed)
Physical Therapy Evaluation Patient Details Name: Monica Blankenship MRN: [id number] DOB: 12-06-1928 Today's Date: 02/12/2016   History of Present Illness  82 year old lady with history of anxiety, insomnia, osteoarthritis, anemia, prior CVA as well as dementia, brought from rehabilitation Center with fever or chills cough with yellow sputum for a couple weeks failing oral Augmentin.    Clinical Impression  Pt presents with severe limitations to functional mobility, likely at/near baseline but unable to assess due to cognition.  Recommend return to SNF at dc.  No acute PT needs.    Follow Up Recommendations SNF    Equipment Recommendations  None recommended by PT    Recommendations for Other Services       Precautions / Restrictions Precautions Precautions: Fall      Mobility  Bed Mobility Overal bed mobility: Needs Assistance Bed Mobility: Sit to Supine;Supine to Sit     Supine to sit: Total assist;HOB elevated Sit to supine: Total assist;HOB elevated   General bed mobility comments: pt asked to move states ok, continues to pick nose and scratch head, does not initiate movement at all  Transfers                 General transfer comment: partial stand acheived total assist, terminated attempts for safety  Ambulation/Gait             General Gait Details: unable  Stairs            Wheelchair Mobility    Modified Rankin (Stroke Patients Only)       Balance Overall balance assessment: Needs assistance Sitting-balance support: No upper extremity supported;Feet unsupported Sitting balance-Leahy Scale: Zero Sitting balance - Comments: will not place feet on ground, leans posteriorly, feels like bed is tilting Postural control: Posterior lean Standing balance support:  (unable to attempt)                                 Pertinent Vitals/Pain Pain Assessment: No/denies pain    Home Living Family/patient expects to be discharged  to:: Skilled nursing facility                      Prior Function Level of Independence: Needs assistance   Gait / Transfers Assistance Needed: unable  ADL's / Homemaking Assistance Needed: total        Hand Dominance   Dominant Hand: Right    Extremity/Trunk Assessment   Upper Extremity Assessment: LUE deficits/detail;Defer to OT evaluation       LUE Deficits / Details: hemiparetic, incr tone   Lower Extremity Assessment: Generalized weakness;Difficult to assess due to impaired cognition (could not or would not move legs)         Communication   Communication: No difficulties  Cognition Arousal/Alertness: Awake/alert Behavior During Therapy: Flat affect Overall Cognitive Status: No family/caregiver present to determine baseline cognitive functioning (hx dementia, presentation consistent w/ but baseline unknown)                      General Comments General comments (skin integrity, edema, etc.): lying on urine drenched pads, changed and cleaned before ending session    Exercises        Assessment/Plan    PT Assessment All further PT needs can be met in the next venue of care  PT Diagnosis Generalized weakness   PT Problem List Impaired tone;Decreased cognition;Decreased mobility;Decreased range of motion;Decreased  balance  PT Treatment Interventions     PT Goals (Current goals can be found in the Care Plan section) Acute Rehab PT Goals Patient Stated Goal: unable PT Goal Formulation: All assessment and education complete, DC therapy    Frequency     Barriers to discharge        Co-evaluation               End of Session   Activity Tolerance: Other (comment) (limited by cognition) Patient left: in bed;with call bell/phone within reach Nurse Communication: Mobility status         Time: 0071-2197 PT Time Calculation (min) (ACUTE ONLY): 24 min   Charges:   PT Evaluation $PT Eval Moderate Complexity: 1 Procedure      PT G Codes:        Herbie Drape 02/12/2016, 5:10 PM

## 2016-02-13 LAB — BASIC METABOLIC PANEL
ANION GAP: 9 (ref 5–15)
BUN: 9 mg/dL (ref 6–20)
CO2: 24 mmol/L (ref 22–32)
Calcium: 8.8 mg/dL — ABNORMAL LOW (ref 8.9–10.3)
Chloride: 106 mmol/L (ref 101–111)
Creatinine, Ser: 0.63 mg/dL (ref 0.44–1.00)
GFR calc Af Amer: >60 mL/min (ref >60)
GLUCOSE: 90 mg/dL (ref 65–99)
POTASSIUM: 3 mmol/L — AB (ref 3.5–5.1)
SODIUM: 139 mmol/L (ref 135–145)

## 2016-02-13 LAB — LEGIONELLA ANTIGEN, URINE

## 2016-02-13 MED ORDER — ENSURE ENLIVE PO LIQD
237.0000 mL | Freq: Two times a day (BID) | ORAL | Status: DC
Start: 2016-02-13 — End: 2016-02-16
  Administered 2016-02-13 – 2016-02-15 (×6): 237 mL via ORAL

## 2016-02-13 MED ORDER — POTASSIUM CHLORIDE CRYS ER 20 MEQ PO TBCR
40.0000 meq | EXTENDED_RELEASE_TABLET | ORAL | Status: AC
  Filled 2016-02-13: qty 2

## 2016-02-13 MED ORDER — POTASSIUM CHLORIDE CRYS ER 20 MEQ PO TBCR
40.0000 meq | EXTENDED_RELEASE_TABLET | ORAL | Status: AC
  Administered 2016-02-13 (×2): 40 meq via ORAL
  Filled 2016-02-13: qty 2

## 2016-02-13 MED ORDER — GUAIFENESIN-DM 100-10 MG/5ML PO SYRP
5.0000 mL | ORAL_SOLUTION | ORAL | Status: DC | PRN
  Administered 2016-02-13 – 2016-02-14 (×2): 5 mL via ORAL
  Filled 2016-02-13 (×2): qty 5

## 2016-02-13 NOTE — Progress Notes (Signed)
PROGRESS NOTE  Monica Blankenship ZOX:[id number] DOB: February 24, 1929 DOA: 02/10/2016 PCP: Georgann Housekeeper, MD Outpatient Specialists:    LOS: 2 days   Brief Narrative: 82 year old lady with history of anxiety, insomnia, osteoarthritis, anemia, prior CVA as well as dementia, brought from rehabilitation Center with fever or chills cough with yellow sputum for a couple weeks failing oral Augmentin.  Assessment & Plan: Principal Problem:   CAP (community acquired pneumonia) Active Problems:   Anemia   Osteoarthritis   Aortic stenosis   Chronic diastolic CHF (congestive heart failure) (HCC)   Essential hypertension   HCAP - she comes from a rehabilitation center and will treat as healthcare associated pneumonia with vancomycin and cefepime. - cultures obtained and pending - influenza panel negative - fever curve improved. Last fever 3/11 am.  - Low grade temp 99 today, continue IV antibiotics  Anemia - no clinical evidence of bleed  Hypokalemia / hypophosphatemia  - likely due to poor by mouth intake, replete again today   Osteoarthritis - Continue Tylenol as needed.  Aortic stenosis  Acute on chronic diastolic CHF (congestive heart failure) (HCC) - chest x-ray with mild evidence of fluid overload, she has crackles on exam, s/p IV Lasix 1 on 3/11 - no Lasix today, no crackles on exam  Essential hypertension - Continue hydralazine.  DVT prophylaxis: Lovenox Code Status: Full Family Communication: no family bedside Disposition Plan: SNF 2-3 days Barriers for discharge: IV antibiotics  Consultants:   None   Procedures:   None   Antimicrobials:  Vancomycin 3/11 >>  Cefepime 3/11 >>   Subjective: - pleasantly confused, denies chest pain / dyspnea.  Objective: Filed Vitals:   02/13/16 0400 02/13/16 0439 02/13/16 0714 02/13/16 1124  BP:  171/63    Pulse:  76    Temp:  99 F (37.2 C)    TempSrc:  Oral    Resp:  16    Height:      Weight: 58.4 kg (128 lb 12  oz)     SpO2:  99% 93% 93%    Intake/Output Summary (Last 24 hours) at 02/13/16 1130 Last data filed at 02/13/16 0800  Gross per 24 hour  Intake    360 ml  Output      0 ml  Net    360 ml   Filed Weights   02/11/16 0309 02/12/16 0544 02/13/16 0400  Weight: 59.9 kg (132 lb 0.9 oz) 58.5 kg (128 lb 15.5 oz) 58.4 kg (128 lb 12 oz)   Examination: BP 171/63 mmHg  Pulse 76  Temp(Src) 99 F (37.2 C) (Oral)  Resp 16  Ht  (1.575 m)  Wt 58.4 kg (128 lb 12 oz)  BMI 23.54 kg/m2  SpO2 93%  GENERAL: NAD  HEENT: head NCAT, no scleral icterus. Pupils round and reactive.   LUNGS: Clear to auscultation. No wheezing or crackles  HEART: Regular rate and rhythm without murmur. 2+ pulses, no JVD, no peripheral edema  ABDOMEN: Soft, nontender, and nondistended. Positive bowel sounds.   EXTREMITIES: Without any cyanosis or clubbing  NEUROLOGIC: Alert and oriented x1 to person only.   Data Reviewed: I have personally reviewed following labs and imaging studies  CBC:  Recent Labs Lab 02/10/16 2223 02/11/16 0008 02/11/16 1033 02/12/16 0325  WBC 9.2  --  8.1 6.8  NEUTROABS  --   --  5.9  --   HGB 11.2* 12.2 10.2* 11.2*  HCT 33.6* 36.0 32.2* 34.9*  MCV 100.0  --  98.5 98.0  PLT 189  --  186 193   Basic Metabolic Panel:  Recent Labs Lab 02/10/16 2223 02/11/16 0008 02/11/16 1033 02/12/16 0325 02/13/16 0311  NA 142 144 136 144 139  K 3.7 3.7 2.8* 3.6 3.0*  CL 106 107 99* 106 106  CO2 23  --  25 24 24   GLUCOSE 96 87 92 93 90  BUN 17 21* 12 9 9   CREATININE 0.72 0.60 0.64 0.67 0.63  CALCIUM 9.2  --  8.5* 9.0 8.8*  MG  --   --  1.7  --   --   PHOS  --   --  2.2*  --   --    GFR: Estimated Creatinine Clearance: 41.4 mL/min (by C-G formula based on Cr of 0.63). Liver Function Tests:  Recent Labs Lab 02/11/16 1033  AST 43*  ALT 35  ALKPHOS 69  BILITOT 1.0  PROT 6.6  ALBUMIN 3.3*   Anemia Panel:  Recent Labs  02/11/16 1033  VITAMINB12 852  FOLATE 22.4    FERRITIN 162  TIBC 238*  IRON 18*  RETICCTPCT 2.2   Urine analysis:    Component Value Date/Time   COLORURINE YELLOW 02/11/2016 1409   APPEARANCEUR CLEAR 02/11/2016 1409   LABSPEC 1.013 02/11/2016 1409   PHURINE 6.0 02/11/2016 1409   GLUCOSEU NEGATIVE 02/11/2016 1409   HGBUR NEGATIVE 02/11/2016 1409   BILIRUBINUR NEGATIVE 02/11/2016 1409   KETONESUR NEGATIVE 02/11/2016 1409   PROTEINUR NEGATIVE 02/11/2016 1409   UROBILINOGEN 1.0 07/23/2015 1809   NITRITE NEGATIVE 02/11/2016 1409   LEUKOCYTESUR NEGATIVE 02/11/2016 1409   Sepsis Labs: Invalid input(s): PROCALCITONIN, LACTICIDVEN  Recent Results (from the past 240 hour(s))  Blood Culture (routine x 2)     Status: None (Preliminary result)   Collection Time: 02/10/16 10:05 PM  Result Value Ref Range Status   Specimen Description BLOOD RIGHT FOREARM  Final   Special Requests IN PEDIATRIC BOTTLE 1CC  Final   Culture NO GROWTH 3 DAYS  Final   Report Status PENDING  Incomplete  Blood Culture (routine x 2)     Status: None (Preliminary result)   Collection Time: 02/10/16 10:21 PM  Result Value Ref Range Status   Specimen Description BLOOD RIGHT HAND  Final   Special Requests BOTTLES DRAWN AEROBIC AND ANAEROBIC 5CC  Final   Culture NO GROWTH 3 DAYS  Final   Report Status PENDING  Incomplete  MRSA PCR Screening     Status: None   Collection Time: 02/11/16  3:21 AM  Result Value Ref Range Status   MRSA by PCR NEGATIVE NEGATIVE Final    Comment:        The GeneXpert MRSA Assay (FDA approved for NASAL specimens only), is one component of a comprehensive MRSA colonization surveillance program. It is not intended to diagnose MRSA infection nor to guide or monitor treatment for MRSA infections.   Urine culture     Status: None   Collection Time: 02/11/16  2:09 PM  Result Value Ref Range Status   Specimen Description URINE, CATHETERIZED  Final   Special Requests NONE  Final   Culture NO GROWTH 1 DAY  Final   Report Status  02/12/2016 FINAL  Final      Radiology Studies: No results found.   Scheduled Meds: . aspirin EC  325 mg Oral Daily  . ceFEPime (MAXIPIME) IV  1 g Intravenous Q24H  . citalopram  10 mg Oral Daily  . clopidogrel  75 mg Oral Daily  .  enoxaparin (LOVENOX) injection  40 mg Subcutaneous Q24H  . feeding supplement (ENSURE ENLIVE)  237 mL Oral BID BM  . guaiFENesin  600 mg Oral BID  . hydrALAZINE  75 mg Oral 3 times per day  . ipratropium-albuterol  3 mL Nebulization TID  . lactobacillus acidophilus  1 tablet Oral BID  . pantoprazole  40 mg Oral Daily  . polyethylene glycol  17 g Oral Daily  . polyvinyl alcohol  1 drop Both Eyes TID  . potassium chloride SA  20 mEq Oral Daily  . potassium chloride  40 mEq Oral Q3H  . sodium chloride flush  3 mL Intravenous Q12H  . sodium chloride flush  3 mL Intravenous Q12H  . tamsulosin  0.4 mg Oral QHS  . vancomycin  750 mg Intravenous Q24H  . verapamil  120 mg Oral Daily   Continuous Infusions:   Pamella Pert, MD, PhD [facility] Pager [phone]  If 7PM-7AM, please contact night-coverage [email or web address] Password [email or web address] 02/13/2016, 11:30 AM

## 2016-02-13 NOTE — Progress Notes (Signed)
Speech Language Pathology Treatment: Dysphagia  Discharge Note   Patient Details Name: Monica Blankenship MRN: [id number] DOB: 1929-08-11 Today's Date: 02/13/2016 Time: 0638-6854 SLP Time Calculation (min) (ACUTE ONLY): 12 min  Assessment / Plan / Recommendation Clinical Impression  ST follow up for therapeutic diet tolerance.  Nursing reporting no obvious issues with intake and that the patient's lungs are improving.  Meal observation completed and the patient's oral and pharyngeal swallow appeared to be functional.  Swallow trigger was timely and hyo-laryngeal excursion was judged to be adequate.  Mastication of dry solids was functional.  Overt s/s of aspiration were not seen.  Recommend advance diet to regular solids and thin liquids.  ST to sign off at this time.  Thank you for the consult.     HPI HPI: 82 y.o. female with a past medical history of anxiety, insomnia, osteoarthritis, anemia, hypertension, CVA, mitral stenosis, aortic stenosis, aortic regurgitation who is brought in from [facility] rehabilitation center with dx of CAP, acute on chronic diastolic CHF .  Pt is known to SLP services from prior admissions for CVA; has had several swallow evals, the last of which was 12/29/14,with recs to continue dysphagia 3 diet, thin liquids.       SLP Plan  Discharge SLP treatment due to (comment) (goals met)     Recommendations  Diet recommendations: Regular;Thin liquid Liquids provided via: Straw;Cup Medication Administration: Whole meds with puree Supervision: Patient able to self feed Compensations: Slow rate;Small sips/bites Postural Changes and/or Swallow Maneuvers: Seated upright 90 degrees;Upright 30-60 min after meal             Oral Care Recommendations: Oral care BID Follow up Recommendations: None Plan: Discharge SLP treatment due to (comment) (goals met)     GO               .Shelly Flatten, MA, [facility] SLP [phone]  Lamar Sprinkles 02/13/2016, 4:17 PM

## 2016-02-13 NOTE — Clinical Social Work Note (Signed)
Clinical Social Work Assessment  Blankenship Details  Name: Monica Blankenship MRN: [id number] Date of Birth: Feb 25, 1929  Date of referral:  02/13/16               Reason for consult:  Facility Placement                Permission sought to share information with:  Facility Medical sales representative, Psychiatrist Permission granted to share information::  No (Blankenship disoriented; completed assessment with daughter, Monica Blankenship.)  Name::     Monica Blankenship  Agency::  [facility]   Relationship::  Daughter  Contact Information:  [phone]  Housing/Transportation Living arrangements for the past 2 months:  Skilled Nursing Facility Source of Information:  Adult Children Blankenship Interpreter Needed:  None Criminal Activity/Legal Involvement Pertinent to Current Situation/Hospitalization:  No - Comment as needed Significant Relationships:  Adult Children Lives with:  Self Do you feel safe going back to the place where you live?  Yes Need for family participation in Blankenship care:  Yes (Comment)  Care giving concerns:  CSW received referral for Blankenship to return to SNF at discharge. Blankenship is disoriented. CSW spoke with Blankenship's daughter, Monica Blankenship. CSW to continue to follow and assist with discharge planning needs.   Social Worker assessment / plan:  Blankenship is from [facility] as a long term care resident. Blankenship's daughter says she likes the care the Blankenship receives there and would like the Blankenship to return there at discharge. Blankenship's daughter is to call [facility] admissions to see if a bed hold is necessary.   Employment status:  Retired Database administrator PT Recommendations:  Skilled Nursing Facility Information / Referral to community resources:  Skilled Nursing Facility  Blankenship/Family's Response to care:  Blankenship's daughter expresses agreement with discharge plan.  Blankenship/Family's Understanding of and Emotional Response to Diagnosis, Current Treatment, and Prognosis:  No  questions/concerns.   Emotional Assessment Appearance:  Appears stated age Attitude/Demeanor/Rapport:  Unable to Assess (Blankenship disoriented) Affect (typically observed):  Unable to Assess (Disoriented;) Orientation:  Oriented to Self Alcohol / Substance use:  Not Applicable Psych involvement (Current and /or in the community):  No (Comment)  Discharge Needs  Concerns to be addressed:  Care Coordination Readmission within the last 30 days:  No Current discharge risk:  None Barriers to Discharge:  Continued Medical Work up   [employer], LCSWA 02/13/2016, 1:13 PM

## 2016-02-13 NOTE — Progress Notes (Addendum)
Initial Nutrition Assessment  DOCUMENTATION CODES:   Not applicable  INTERVENTION:   Continue Ensure Enlive po BID , each supplement provides 350 kcal and 20 grams of protein  NUTRITION DIAGNOSIS:   Increased nutrient needs related to acute illness as evidenced by estimated needs   GOAL:   Patient will meet greater than or equal to 90% of their needs  MONITOR:   PO intake, Supplement acceptance, Labs, Weight trends, I & O's  REASON FOR ASSESSMENT:   Malnutrition Screening Tool  ASSESSMENT:   82 yo Female with history of anxiety, insomnia, osteoarthritis, anemia, prior CVA as well as dementia, brought from rehabilitation Center with fever or chills cough with yellow sputum for a couple weeks failing oral Augmentin.  Patient not very conversant upon RD visit. Did report she's eating "ok". Having a coughing spell as well. PO intake variable at 20-75% per flowsheet records. Receiving Ensure Enlive BID. RD unable to complete Nutrition Focused Physical Exam at this time.  Diet Order:  DIET DYS 3 Room service appropriate?: Yes; Fluid consistency:: Thin  Skin:  Reviewed, no issues  Last BM:  3/11  Height:   Ht Readings from Last 1 Encounters:  02/11/16 5\' 2"  (1.575 m)    Weight:   Wt Readings from Last 1 Encounters:  02/13/16 128 lb 12 oz (58.4 kg)    Ideal Body Weight:  50 kg  BMI:  Body mass index is 23.54 kg/(m^2).  Estimated Nutritional Needs:   Kcal:  1450-1650  Protein:  70-80 gm  Fluid:  >/= 1.5 L  EDUCATION NEEDS:   No education needs identified at this time  02/15/16, RD, LDN Pager #: [phone] After-Hours Pager #: [phone]

## 2016-02-13 NOTE — NC FL2 (Signed)
MEDICAID FL2 LEVEL OF CARE SCREENING TOOL     IDENTIFICATION  Patient Name: Monica Blankenship Birthdate: [birth year] Sex: female Admission Date (Current Location): 02/10/2016  [facility] and [place] Number:  Producer, television/film/video and Address:  [facility] [place], [address], [place], [place] [address]      Provider Number: [id number]  Attending Physician Name and Address:  Leatha Gilding, MD  Relative Name and Phone Number:  [name], daughter, [phone]    Current Level of Care: Hospital Recommended Level of Care: Skilled Nursing Facility Prior Approval Number:    Date Approved/Denied:   PASRR Number:    Discharge Plan: Home    Current Diagnoses: Patient Active Problem List   Diagnosis Date Noted  . Palliative care encounter 03/02/2015  . Decreased appetite 03/02/2015  . Weakness 03/02/2015  . Agitation 03/02/2015  . Catheter-associated urinary tract infection (HCC) 02/28/2015  . Vascular dementia 02/28/2015  . Protein-calorie malnutrition, severe (HCC) 02/28/2015  . UTI (urinary tract infection) 02/27/2015  . Chest pain 02/26/2015  . UTI (lower urinary tract infection) 02/26/2015  . Foley catheter in place on admission 02/26/2015  . Falls 02/26/2015  . Swelling of left hand 02/26/2015  . Atypical chest pain   . Complicated UTI (urinary tract infection)   . Essential hypertension   . Disorientation   . Urinary retention 12/28/2014  . Altered mental status 12/14/2014  . Acute encephalopathy 12/14/2014  . Hypokalemia 12/14/2014  . Left-sided weakness 11/15/2014  . Slurring of speech 11/15/2014  . Stroke (HCC) 11/15/2014  . Hypertension 11/15/2014  . SBE (subacute bacterial endocarditis) (HCC) 04/13/2014  . Delirium 04/10/2014  . Sepsis (HCC) 04/08/2014  . Hyperkalemia 04/08/2014  . Severe sepsis(995.92) 04/08/2014  . Lactic acid acidosis 04/08/2014  . Encephalopathy acute 04/08/2014  . Complex sleep apnea syndrome  03/01/2014  . Syncope 02/08/2014  . Nocturnal hypoxemia 01/08/2014  . Bradycardia 11/17/2013  . CAP (community acquired pneumonia) 11/05/2013  . Obstructive chronic bronchitis without exacerbation (HCC) 11/05/2013  . Daytime somnolence 11/05/2013  . Pulmonary HTN (HCC) 10/21/2013  . HTN (hypertension)   . Chronic diastolic CHF (congestive heart failure) (HCC) 10/01/2013  . Allergy   . Anemia   . Anxiety   . Insomnia   . Osteoarthritis   . Obesity   . CKD (chronic kidney disease)   . Mitral stenosis   . Aortic stenosis   . Aortic regurgitation     Orientation RESPIRATION BLADDER Height & Weight     Self  Normal Incontinent Weight: 58.4 kg (128 lb 12 oz) Height:  5\' 2"  (157.5 cm)  BEHAVIORAL SYMPTOMS/MOOD NEUROLOGICAL BOWEL NUTRITION STATUS      Incontinent  (Please see DC summary)  AMBULATORY STATUS COMMUNICATION OF NEEDS Skin   Extensive Assist Verbally Normal                       Personal Care Assistance Level of Assistance  Bathing, Feeding, Dressing Bathing Assistance: Maximum assistance Feeding assistance: Limited assistance Dressing Assistance: Limited assistance     Functional Limitations Info             SPECIAL CARE FACTORS FREQUENCY  PT (By licensed PT)     PT Frequency: min 3x/week              Contractures      Additional Factors Info  Code Status, Allergies, Isolation Precautions Code Status Info: Full Allergies Info: Fentanyl  Isolation Precautions Info: MRSA     Current Medications (02/13/2016):  This is the current hospital active medication list Current Facility-Administered Medications  Medication Dose Route Frequency Provider Last Rate Last Dose  . 0.9 %  sodium chloride infusion  250 mL Intravenous PRN Bobette Mo, MD      . acetaminophen (TYLENOL) tablet 650 mg  650 mg Oral Q6H PRN Bobette Mo, MD   650 mg at 02/11/16 1126  . aspirin EC tablet 325 mg  325 mg Oral Daily Bobette Mo, MD   325 mg at  02/12/16 1015  . ceFEPIme (MAXIPIME) 1 g in dextrose 5 % 50 mL IVPB  1 g Intravenous Q24H Earnie Larsson, RPH   1 g at 02/12/16 1316  . citalopram (CELEXA) tablet 10 mg  10 mg Oral Daily Bobette Mo, MD   10 mg at 02/12/16 1014  . clopidogrel (PLAVIX) tablet 75 mg  75 mg Oral Daily Bobette Mo, MD   75 mg at 02/12/16 1014  . diphenhydrAMINE-zinc acetate (BENADRYL) 2-0.1 % cream   Topical BID PRN Leatha Gilding, MD      . enoxaparin (LOVENOX) injection 40 mg  40 mg Subcutaneous Q24H Bobette Mo, MD   40 mg at 02/12/16 1014  . feeding supplement (ENSURE ENLIVE) (ENSURE ENLIVE) liquid 237 mL  237 mL Oral BID BM Costin Otelia Sergeant, MD      . guaiFENesin (MUCINEX) 12 hr tablet 600 mg  600 mg Oral BID Bobette Mo, MD   600 mg at 02/12/16 2142  . hydrALAZINE (APRESOLINE) tablet 75 mg  75 mg Oral 3 times per day Bobette Mo, MD   75 mg at 02/13/16 0500  . ipratropium-albuterol (DUONEB) 0.5-2.5 (3) MG/3ML nebulizer solution 3 mL  3 mL Nebulization TID Leatha Gilding, MD   3 mL at 02/13/16 1124  . lactobacillus acidophilus (BACID) tablet 1 tablet  1 tablet Oral BID Bobette Mo, MD   1 tablet at 02/12/16 2142  . LORazepam (ATIVAN) tablet 0.5 mg  0.5 mg Oral Q6H PRN Bobette Mo, MD   0.5 mg at 02/11/16 2250  . ondansetron (ZOFRAN) tablet 4 mg  4 mg Oral Q6H PRN Bobette Mo, MD       Or  . ondansetron [facility]) injection 4 mg  4 mg Intravenous Q6H PRN Bobette Mo, MD      . pantoprazole (PROTONIX) EC tablet 40 mg  40 mg Oral Daily Bobette Mo, MD   40 mg at 02/12/16 1033  . polyethylene glycol (MIRALAX / GLYCOLAX) packet 17 g  17 g Oral Daily Bobette Mo, MD   17 g at 02/11/16 1000  . polyvinyl alcohol (LIQUIFILM TEARS) 1.4 % ophthalmic solution 1 drop  1 drop Both Eyes TID Bobette Mo, MD   1 drop at 02/12/16 2143  . potassium chloride SA (K-DUR,KLOR-CON) CR tablet 40 mEq  40 mEq Oral Q2H Costin Otelia Sergeant, MD      . sodium  chloride flush (NS) 0.9 % injection 3 mL  3 mL Intravenous Q12H Bobette Mo, MD   3 mL at 02/11/16 1000  . sodium chloride flush (NS) 0.9 % injection 3 mL  3 mL Intravenous Q12H Bobette Mo, MD   3 mL at 02/12/16 2144  . sodium chloride flush (NS) 0.9 % injection 3 mL  3 mL Intravenous PRN Bobette Mo, MD      .  tamsulosin (FLOMAX) capsule 0.4 mg  0.4 mg Oral QHS Bobette Mo, MD   0.4 mg at 02/12/16 2142  . verapamil (CALAN-SR) CR tablet 120 mg  120 mg Oral Daily Bobette Mo, MD   120 mg at 02/12/16 1015     Discharge Medications: Please see discharge summary for a list of discharge medications.  Relevant Imaging Results:  Relevant Lab Results:   Additional Information SSN: [id number] [place], [place]

## 2016-02-14 ENCOUNTER — Inpatient Hospital Stay (HOSPITAL_COMMUNITY): Payer: Medicare Other

## 2016-02-14 LAB — BASIC METABOLIC PANEL
Anion gap: 9 (ref 5–15)
BUN: 11 mg/dL (ref 6–20)
CALCIUM: 8.9 mg/dL (ref 8.9–10.3)
CO2: 26 mmol/L (ref 22–32)
CREATININE: 0.69 mg/dL (ref 0.44–1.00)
Chloride: 109 mmol/L (ref 101–111)
GFR calc Af Amer: >60 mL/min (ref >60)
GFR calc non Af Amer: >60 mL/min (ref >60)
GLUCOSE: 108 mg/dL — AB (ref 65–99)
Potassium: 3.3 mmol/L — ABNORMAL LOW (ref 3.5–5.1)
Sodium: 144 mmol/L (ref 135–145)

## 2016-02-14 MED ORDER — IPRATROPIUM-ALBUTEROL 0.5-2.5 (3) MG/3ML IN SOLN
3.0000 mL | Freq: Two times a day (BID) | RESPIRATORY_TRACT | Status: DC
  Administered 2016-02-14 – 2016-02-16 (×4): 3 mL via RESPIRATORY_TRACT
  Filled 2016-02-14 (×4): qty 3

## 2016-02-14 MED ORDER — FUROSEMIDE 10 MG/ML IJ SOLN
20.0000 mg | Freq: Once | INTRAMUSCULAR | Status: AC
  Administered 2016-02-14: 20 mg via INTRAVENOUS
  Filled 2016-02-14: qty 2

## 2016-02-14 MED ORDER — POTASSIUM CHLORIDE CRYS ER 20 MEQ PO TBCR
40.0000 meq | EXTENDED_RELEASE_TABLET | ORAL | Status: AC
  Administered 2016-02-14 (×2): 40 meq via ORAL
  Filled 2016-02-14: qty 2

## 2016-02-14 NOTE — Care Management Important Message (Signed)
Important Message  Patient Details  Name: Monica Blankenship MRN: [id number] Date of Birth: Mar 29, 1929   Medicare Important Message Given:  Yes    Kyla Balzarine 02/14/2016, 11:31 AM

## 2016-02-14 NOTE — Progress Notes (Signed)
Utilization review completed.  

## 2016-02-14 NOTE — Progress Notes (Signed)
Pharmacy Antibiotic Note  Monica Blankenship is a 82 y.o. female admitted on 02/10/2016 with PNA that did not improve with PO Augmentin.  Pharmacy has been consulted for cefepime dosing (day #4 of therapy).  Patient's renal function has been stable  Plan: - Continue cefepime 1gm IV Q24H - Consider adding stop date (guideline recommends 7 days of treatment) - Consider supplementing K+, iron - Pharmacy will sign off as dosage adjustment in likely unnecessary.  Thank you for the consult!   Height: 5\' 2"  (157.5 cm) Weight: 133 lb 2.5 oz (60.4 kg) IBW/kg (Calculated) : 50.1  Temp (24hrs), Avg:98.5 F (36.9 C), Min:98.2 F (36.8 C), Max:99 F (37.2 C)   Recent Labs Lab 02/10/16 2223 02/10/16 2236 02/11/16 0008 02/11/16 1033 02/12/16 0325 02/13/16 0311 02/14/16 0342  WBC 9.2  --   --  8.1 6.8  --   --   CREATININE 0.72  --  0.60 0.64 0.67 0.63 0.69  LATICACIDVEN  --  1.77  --   --   --   --   --     Estimated Creatinine Clearance: 44.8 mL/min (by C-G formula based on Cr of 0.69).    Allergies  Allergen Reactions  . Fentanyl Other (See Comments)    confusion    Antimicrobials this admission: Vanc 3/11 >>  3/13 Cefepime 3/11 >>  Augmentin PTA  Dose adjustments this admission: N/A  Microbiology results: 3/11 UCx - negative 3/10 BCx x2 - NGTD    Rajinder Mesick D. 5/11, PharmD, BCPS Pager:  [phone] 02/14/2016, 8:50 AM

## 2016-02-14 NOTE — Progress Notes (Signed)
PROGRESS NOTE  Monica Blankenship:[id number] DOB: December 15, 1928 DOA: 02/10/2016 PCP: Georgann Housekeeper, MD Outpatient Specialists:    LOS: 3 days   Brief Narrative: 82 year old lady with history of anxiety, insomnia, osteoarthritis, anemia, prior CVA as well as dementia, brought from rehabilitation Center with fever or chills cough with yellow sputum for a couple weeks failing oral Augmentin.  Interim summary Patient admitted on 3/11 with healthcare associated pneumonia, started on vancomycin and cefepime, had a low-grade temp on 3/13, afebrile since, currently on cefepime alone. Mild vascular congestion and a chest x-ray and crackles exam requiring IV diuresis 2. Stable and close to discharge in 1-2 days.   Assessment & Plan: Principal Problem:   CAP (community acquired pneumonia) Active Problems:   Anemia   Osteoarthritis   Aortic stenosis   Chronic diastolic CHF (congestive heart failure) (HCC)   Essential hypertension   HCAP - she comes from a rehabilitation center and will treat as healthcare associated pneumonia with vancomycin and cefepime. - cultures obtained and pending - influenza panel negative - Urine culture remained negative - fever curve improved. Last fever 3/11 am and low-grade temp and 3/13. Continue cefepime for now, today is day 4, and plan for a total of 7 days  Anemia - no clinical evidence of bleed hemoglobin is stable,   Acute on chronic diastolic CHF (congestive heart failure) (HCC) - chest x-ray with mild evidence of fluid overload, she has crackles on exam, s/p IV Lasix 1 on 3/11 - Patient with crackles on 3/14 and mild wheezing, repeat chest x-ray with vascular congestion, give another dose of Lasix today along with potassium.Renal function is stable   Hypokalemia / hypophosphatemia  - likely due to poor by mouth intake as well as IV diuresis, replete as needed   Osteoarthritis - Continue Tylenol as needed.  Aortic stenosis  Essential  hypertension - Continue hydralazine.   DVT prophylaxis: Lovenox Code Status: Full Family Communication: no family bedside, discussed with daughter 3/13 Disposition Plan: SNF 1-2 days  Barriers for discharge: IV antibiotics, IV lasix  Consultants:   None   Procedures:   None   Antimicrobials:  Vancomycin 3/11 >> 3/13  Cefepime 3/11 >>   Subjective: - pleasantly confused, denies chest pain / dyspnea. Endorses "coughing all night long"   Objective: Filed Vitals:   02/13/16 1423 02/13/16 2133 02/13/16 2246 02/14/16 0540  BP: 166/66  139/55 158/56  Pulse: 65  74 78  Temp: 99 F (37.2 C)  98.2 F (36.8 C) 98.2 F (36.8 C)  TempSrc: Oral  Oral Oral  Resp: 18  18 18   Height:      Weight:    60.4 kg (133 lb 2.5 oz)  SpO2: 98% 96% 97% 96%    Intake/Output Summary (Last 24 hours) at 02/14/16 1003 Last data filed at 02/13/16 1900  Gross per 24 hour  Intake    240 ml  Output      0 ml  Net    240 ml   Filed Weights   02/12/16 0544 02/13/16 0400 02/14/16 0540  Weight: 58.5 kg (128 lb 15.5 oz) 58.4 kg (128 lb 12 oz) 60.4 kg (133 lb 2.5 oz)   Examination: BP 158/56 mmHg  Pulse 78  Temp(Src) 98.2 F (36.8 C) (Oral)  Resp 18  Ht 5\' 2"  (1.575 m)  Wt 60.4 kg (133 lb 2.5 oz)  BMI 24.35 kg/m2  SpO2 96%  GENERAL: NAD  HEENT: head NCAT, no scleral icterus. Pupils round and reactive.  LUNGS: moves air well, bibasilar crackles, scattered wheezing   HEART: Regular rate and rhythm without murmur. 2+ pulses, no JVD, no peripheral edema  ABDOMEN: Soft, nontender, and nondistended. Positive bowel sounds.   EXTREMITIES: Without any cyanosis or clubbing  NEUROLOGIC: Alert and oriented x1 to person only.   Data Reviewed: I have personally reviewed following labs and imaging studies  CBC:  Recent Labs Lab 02/10/16 2223 02/11/16 0008 02/11/16 1033 02/12/16 0325  WBC 9.2  --  8.1 6.8  NEUTROABS  --   --  5.9  --   HGB 11.2* 12.2 10.2* 11.2*  HCT 33.6* 36.0  32.2* 34.9*  MCV 100.0  --  98.5 98.0  PLT 189  --  186 193   Basic Metabolic Panel:  Recent Labs Lab 02/10/16 2223 02/11/16 0008 02/11/16 1033 02/12/16 0325 02/13/16 0311 02/14/16 0342  NA 142 144 136 144 139 144  K 3.7 3.7 2.8* 3.6 3.0* 3.3*  CL 106 107 99* 106 106 109  CO2 23  --  25 24 24 26   GLUCOSE 96 87 92 93 90 108*  BUN 17 21* 12 9 9 11   CREATININE 0.72 0.60 0.64 0.67 0.63 0.69  CALCIUM 9.2  --  8.5* 9.0 8.8* 8.9  MG  --   --  1.7  --   --   --   PHOS  --   --  2.2*  --   --   --    GFR: Estimated Creatinine Clearance: 44.8 mL/min (by C-G formula based on Cr of 0.69). Liver Function Tests:  Recent Labs Lab 02/11/16 1033  AST 43*  ALT 35  ALKPHOS 69  BILITOT 1.0  PROT 6.6  ALBUMIN 3.3*   Anemia Panel:  Recent Labs  02/11/16 1033  VITAMINB12 852  FOLATE 22.4  FERRITIN 162  TIBC 238*  IRON 18*  RETICCTPCT 2.2   Urine analysis:    Component Value Date/Time   COLORURINE YELLOW 02/11/2016 1409   APPEARANCEUR CLEAR 02/11/2016 1409   LABSPEC 1.013 02/11/2016 1409   PHURINE 6.0 02/11/2016 1409   GLUCOSEU NEGATIVE 02/11/2016 1409   HGBUR NEGATIVE 02/11/2016 1409   BILIRUBINUR NEGATIVE 02/11/2016 1409   KETONESUR NEGATIVE 02/11/2016 1409   PROTEINUR NEGATIVE 02/11/2016 1409   UROBILINOGEN 1.0 07/23/2015 1809   NITRITE NEGATIVE 02/11/2016 1409   LEUKOCYTESUR NEGATIVE 02/11/2016 1409   Sepsis Labs: Invalid input(s): PROCALCITONIN, LACTICIDVEN  Recent Results (from the past 240 hour(s))  Blood Culture (routine x 2)     Status: None (Preliminary result)   Collection Time: 02/10/16 10:05 PM  Result Value Ref Range Status   Specimen Description BLOOD RIGHT FOREARM  Final   Special Requests IN PEDIATRIC BOTTLE 1CC  Final   Culture NO GROWTH 3 DAYS  Final   Report Status PENDING  Incomplete  Blood Culture (routine x 2)     Status: None (Preliminary result)   Collection Time: 02/10/16 10:21 PM  Result Value Ref Range Status   Specimen Description  BLOOD RIGHT HAND  Final   Special Requests BOTTLES DRAWN AEROBIC AND ANAEROBIC 5CC  Final   Culture NO GROWTH 3 DAYS  Final   Report Status PENDING  Incomplete  MRSA PCR Screening     Status: None   Collection Time: 02/11/16  3:21 AM  Result Value Ref Range Status   MRSA by PCR NEGATIVE NEGATIVE Final    Comment:        The GeneXpert MRSA Assay (FDA approved for NASAL specimens only),  is one component of a comprehensive MRSA colonization surveillance program. It is not intended to diagnose MRSA infection nor to guide or monitor treatment for MRSA infections.   Urine culture     Status: None   Collection Time: 02/11/16  2:09 PM  Result Value Ref Range Status   Specimen Description URINE, CATHETERIZED  Final   Special Requests NONE  Final   Culture NO GROWTH 1 DAY  Final   Report Status 02/12/2016 FINAL  Final      Radiology Studies: Dg Chest 2 View  02/14/2016  CLINICAL DATA:  Bibasilar crackles, confusion this morning, hypertension, pulmonary hypertension, valvular heart disease, chronic kidney disease, former smoker EXAM: CHEST  2 VIEW COMPARISON:  02/10/2016 FINDINGS: Enlargement of cardiac silhouette with pulmonary vascular congestion. Shoulder slightly rotated to the RIGHT. Central peribronchial thickening and underlying emphysematous changes. Patchy opacities in the RIGHT mid lung and at LEFT base could be related to atelectasis or infiltrate. No pleural effusion or pneumothorax. Atherosclerotic calcification aorta. Chronic RIGHT rotator cuff tear. Osseous demineralization. IMPRESSION: COPD changes with questionable infiltrates bilaterally. Enlargement of cardiac silhouette with pulmonary vascular congestion. Electronically Signed   By: Ulyses Southward M.D.   On: 02/14/2016 08:34     Scheduled Meds: . aspirin EC  325 mg Oral Daily  . ceFEPime (MAXIPIME) IV  1 g Intravenous Q24H  . citalopram  10 mg Oral Daily  . clopidogrel  75 mg Oral Daily  . enoxaparin (LOVENOX) injection   40 mg Subcutaneous Q24H  . feeding supplement (ENSURE ENLIVE)  237 mL Oral BID BM  . hydrALAZINE  75 mg Oral 3 times per day  . ipratropium-albuterol  3 mL Nebulization BID  . lactobacillus acidophilus  1 tablet Oral BID  . pantoprazole  40 mg Oral Daily  . polyethylene glycol  17 g Oral Daily  . polyvinyl alcohol  1 drop Both Eyes TID  . sodium chloride flush  3 mL Intravenous Q12H  . sodium chloride flush  3 mL Intravenous Q12H  . tamsulosin  0.4 mg Oral QHS  . verapamil  120 mg Oral Daily   Continuous Infusions:   Pamella Pert, MD, PhD [facility] Pager [phone]  If 7PM-7AM, please contact night-coverage [email or web address] Password [email or web address] 02/14/2016, 10:03 AM

## 2016-02-15 DIAGNOSIS — I5032 Chronic diastolic (congestive) heart failure: Secondary | ICD-10-CM

## 2016-02-15 DIAGNOSIS — I350 Nonrheumatic aortic (valve) stenosis: Secondary | ICD-10-CM

## 2016-02-15 DIAGNOSIS — I10 Essential (primary) hypertension: Secondary | ICD-10-CM

## 2016-02-15 DIAGNOSIS — D649 Anemia, unspecified: Secondary | ICD-10-CM

## 2016-02-15 LAB — CBC
HEMATOCRIT: 32.3 % — AB (ref 36.0–46.0)
HEMOGLOBIN: 10.9 g/dL — AB (ref 12.0–15.0)
MCH: 32.7 pg (ref 26.0–34.0)
MCHC: 33.7 g/dL (ref 30.0–36.0)
MCV: 97 fL (ref 78.0–100.0)
Platelets: 216 10*3/uL (ref 150–400)
RBC: 3.33 MIL/uL — ABNORMAL LOW (ref 3.87–5.11)
RDW: 14.4 % (ref 11.5–15.5)
WBC: 7.1 10*3/uL (ref 4.0–10.5)

## 2016-02-15 LAB — CULTURE, BLOOD (ROUTINE X 2)
CULTURE: NO GROWTH
CULTURE: NO GROWTH

## 2016-02-15 LAB — BASIC METABOLIC PANEL
ANION GAP: 13 (ref 5–15)
BUN: 10 mg/dL (ref 6–20)
CALCIUM: 9 mg/dL (ref 8.9–10.3)
CHLORIDE: 105 mmol/L (ref 101–111)
CO2: 24 mmol/L (ref 22–32)
Creatinine, Ser: 0.65 mg/dL (ref 0.44–1.00)
GFR calc non Af Amer: >60 mL/min (ref >60)
GLUCOSE: 87 mg/dL (ref 65–99)
POTASSIUM: 2.7 mmol/L — AB (ref 3.5–5.1)
Sodium: 142 mmol/L (ref 135–145)

## 2016-02-15 LAB — POTASSIUM: POTASSIUM: 4.1 mmol/L (ref 3.5–5.1)

## 2016-02-15 LAB — MAGNESIUM: Magnesium: 2 mg/dL (ref 1.7–2.4)

## 2016-02-15 MED ORDER — FUROSEMIDE 40 MG PO TABS
40.0000 mg | ORAL_TABLET | Freq: Every day | ORAL | Status: DC
  Administered 2016-02-15 – 2016-02-16 (×2): 40 mg via ORAL
  Filled 2016-02-15 (×2): qty 1

## 2016-02-15 MED ORDER — POTASSIUM CHLORIDE 20 MEQ/15ML (10%) PO SOLN
30.0000 meq | ORAL | Status: AC
  Administered 2016-02-15 (×2): 30 meq via ORAL
  Filled 2016-02-15: qty 30

## 2016-02-15 MED ORDER — POTASSIUM CHLORIDE 20 MEQ/15ML (10%) PO SOLN
ORAL | Status: AC
  Filled 2016-02-15: qty 15

## 2016-02-15 MED ORDER — POTASSIUM CHLORIDE 10 MEQ/100ML IV SOLN
INTRAVENOUS | Status: AC
  Administered 2016-02-15: 09:00:00
  Filled 2016-02-15: qty 400

## 2016-02-15 MED ORDER — POTASSIUM CHLORIDE 10 MEQ/100ML IV SOLN
10.0000 meq | INTRAVENOUS | Status: AC
  Administered 2016-02-15 (×3): 10 meq via INTRAVENOUS

## 2016-02-15 NOTE — Progress Notes (Signed)
K Kirby N.P. Text page Critical lab Potassium 2.7 .

## 2016-02-15 NOTE — Progress Notes (Signed)
[facility]                                                                              Patient Demographics  Monica Blankenship, is a 82 y.o. female, DOB - November 04, 1929, GEX:[id number]  Admit date - 02/10/2016   Admitting Physician Bobette Mo, MD  Outpatient Primary MD for the patient is Georgann Housekeeper, MD  LOS - 4   Chief Complaint  Patient presents with  . Bronchitis       Brief HPI  82 year old lady with history of anxiety, insomnia, osteoarthritis, anemia, prior CVA as well as dementia, brought from rehabilitation Center with fever or chills cough with yellow sputum for a couple weeks failing oral Augmentin. Patient was placed on IV vancomycin and cefepime, currently on cefepime alone. Patient also needed IV diuresis, close to DC, potassium 2.7 on 3/15  Assessment & Plan    HCAP: - Presented from a rehabilitation center and will treat as healthcare associated pneumonia with vancomycin and cefepime. - Blood cultures negative to date, influenza panel negative - Urine culture remained negative - Urine strep antigen, urine legionella antigen negative - Afebrile this morning, cefepime day #5 today, transitioned to oral antibiotics in a.m. for potential DC   Anemia - no clinical evidence of bleed hemoglobin is stable,   Acute on chronic diastolic CHF (congestive heart failure) (HCC) - chest x-ray with mild evidence of fluid overload, patient was placed on IV Lasix 2  -Still with positive balance of 2.1 L, transitioned to oral Lasix 40mg  daily (used to be on 10mg  outpatient- Dr )   Hypokalemia / hypophosphatemia  - likely due to poor by mouth intake as well as IV diuresis - Magnesium 2.0, replaced potassium IV and oral daily supplementation  Osteoarthritis - Continue Tylenol as needed.  Aortic stenosis  Essential hypertension - Continue hydralazine.  Consultants:   None  Procedures:   None  Antimicrobials:  Vancomycin 3/11 >>  3/13  Cefepime 3/11 >>   Code Status: Full code  Family Communication: Discussed in detail with the patient, all imaging results, lab results explained to the patient   Disposition Plan:  Likely DC to skilled nursing facility in a.m.  Time Spent in minutes   25 minutes  DVT Prophylaxis  Lovenox   Medications  Scheduled Meds: . aspirin EC  325 mg Oral Daily  . ceFEPime (MAXIPIME) IV  1 g Intravenous Q24H  . citalopram  10 mg Oral Daily  . clopidogrel  75 mg Oral Daily  . enoxaparin (LOVENOX) injection  40 mg Subcutaneous Q24H  . feeding supplement (ENSURE ENLIVE)  237 mL Oral BID BM  . hydrALAZINE  75 mg Oral 3 times per day  . ipratropium-albuterol  3 mL Nebulization BID  . lactobacillus acidophilus  1 tablet Oral BID  . pantoprazole  40 mg Oral Daily  . polyethylene glycol  17 g Oral Daily  . polyvinyl alcohol  1 drop Both Eyes TID  . potassium chloride  10 mEq Intravenous Q1 Hr x 4  . potassium chloride      . potassium chloride      . potassium chloride      .  sodium chloride flush  3 mL Intravenous Q12H  . sodium chloride flush  3 mL Intravenous Q12H  . tamsulosin  0.4 mg Oral QHS  . verapamil  120 mg Oral Daily   Continuous Infusions:  PRN Meds:.sodium chloride, acetaminophen, diphenhydrAMINE-zinc acetate, guaiFENesin-dextromethorphan, LORazepam, ondansetron **OR** ondansetron (ZOFRAN) IV, sodium chloride flush   Antibiotics   Anti-infectives    Start     Dose/Rate Route Frequency Ordered Stop   02/12/16 1800  vancomycin (VANCOCIN) IVPB 750 mg/150 ml premix  Status:  Discontinued     750 mg 150 mL/hr over 60 Minutes Intravenous Every 24 hours 02/11/16 1143 02/13/16 1135   02/11/16 2300  azithromycin (ZITHROMAX) 500 mg in dextrose 5 % 250 mL IVPB  Status:  Discontinued     500 mg 250 mL/hr over 60 Minutes Intravenous Every 24 hours 02/11/16 0406 02/11/16 1104   02/11/16 2200  cefTRIAXone (ROCEPHIN) 1 g in dextrose 5 % 50 mL IVPB  Status:  Discontinued     1  g 100 mL/hr over 30 Minutes Intravenous Every 24 hours 02/11/16 0406 02/11/16 1104   02/11/16 1400  vancomycin (VANCOCIN) IVPB 1000 mg/200 mL premix     1,000 mg 200 mL/hr over 60 Minutes Intravenous  Once 02/11/16 1143 02/11/16 1608   02/11/16 1300  ceFEPIme (MAXIPIME) 1 g in dextrose 5 % 50 mL IVPB     1 g 100 mL/hr over 30 Minutes Intravenous Every 24 hours 02/11/16 1143     02/10/16 2345  cefTRIAXone (ROCEPHIN) 1 g in dextrose 5 % 50 mL IVPB     1 g 100 mL/hr over 30 Minutes Intravenous  Once 02/10/16 2330 02/11/16 0038   02/10/16 2345  azithromycin (ZITHROMAX) 500 mg in dextrose 5 % 250 mL IVPB     500 mg 250 mL/hr over 60 Minutes Intravenous  Once 02/10/16 2330 02/11/16 0200        Subjective:   Monica Blankenship was seen and examined today.  Feels a lot better today, no chest pain, shortness of breath is improving. Coughing is improving. No fevers or chills. Patient denies dizziness, abdominal pain, N/V/D/C, new weakness, numbess, tingling. No acute events overnight.    Objective:   Filed Vitals:   02/14/16 2058 02/14/16 2239 02/15/16 0431 02/15/16 0931  BP:  116/54 148/54   Pulse:  75 83   Temp:  98.4 F (36.9 C) 98.3 F (36.8 C)   TempSrc:  Oral Axillary   Resp:  18 20   Height:      Weight:   57.4 kg (126 lb 8.7 oz)   SpO2: 98% 98% 96% 95%    Intake/Output Summary (Last 24 hours) at 02/15/16 1039 Last data filed at 02/15/16 0823  Gross per 24 hour  Intake    720 ml  Output      0 ml  Net    720 ml     Wt Readings from Last 3 Encounters:  02/15/16 57.4 kg (126 lb 8.7 oz)  03/03/15 63.2 kg (139 lb 5.3 oz)  12/28/14 77.111 kg (170 lb)     Exam  General: Alert and oriented x 3, NAD  HEENT:  PERRLA, EOMI, Anicteric Sclera, mucous membranes moist.   Neck: Supple, no JVD, no masses  CVS: S1 S2 auscultated, no rubs, murmurs or gallops. Regular rate and rhythm.  Respiratory: Decreased breath sounds at the bases   Abdomen: Soft, nontender, nondistended,  + bowel sounds  Ext: no cyanosis clubbing or edema  Neuro: AAOx3,  Cr N's II- XII. Strength 5/5 upper and lower extremities bilaterally  Skin: No rashes  Psych: Normal affect and demeanor, alert and oriented x3    Data Reviewed:  I have personally reviewed following labs and imaging studies  Micro Results Recent Results (from the past 240 hour(s))  Blood Culture (routine x 2)     Status: None (Preliminary result)   Collection Time: 02/10/16 10:05 PM  Result Value Ref Range Status   Specimen Description BLOOD RIGHT FOREARM  Final   Special Requests IN PEDIATRIC BOTTLE 1CC  Final   Culture NO GROWTH 4 DAYS  Final   Report Status PENDING  Incomplete  Blood Culture (routine x 2)     Status: None (Preliminary result)   Collection Time: 02/10/16 10:21 PM  Result Value Ref Range Status   Specimen Description BLOOD RIGHT HAND  Final   Special Requests BOTTLES DRAWN AEROBIC AND ANAEROBIC 5CC  Final   Culture NO GROWTH 4 DAYS  Final   Report Status PENDING  Incomplete  MRSA PCR Screening     Status: None   Collection Time: 02/11/16  3:21 AM  Result Value Ref Range Status   MRSA by PCR NEGATIVE NEGATIVE Final    Comment:        The GeneXpert MRSA Assay (FDA approved for NASAL specimens only), is one component of a comprehensive MRSA colonization surveillance program. It is not intended to diagnose MRSA infection nor to guide or monitor treatment for MRSA infections.   Urine culture     Status: None   Collection Time: 02/11/16  2:09 PM  Result Value Ref Range Status   Specimen Description URINE, CATHETERIZED  Final   Special Requests NONE  Final   Culture NO GROWTH 1 DAY  Final   Report Status 02/12/2016 FINAL  Final    Radiology Reports Dg Chest 2 View  02/14/2016  CLINICAL DATA:  Bibasilar crackles, confusion this morning, hypertension, pulmonary hypertension, valvular heart disease, chronic kidney disease, former smoker EXAM: CHEST  2 VIEW COMPARISON:  02/10/2016  FINDINGS: Enlargement of cardiac silhouette with pulmonary vascular congestion. Shoulder slightly rotated to the RIGHT. Central peribronchial thickening and underlying emphysematous changes. Patchy opacities in the RIGHT mid lung and at LEFT base could be related to atelectasis or infiltrate. No pleural effusion or pneumothorax. Atherosclerotic calcification aorta. Chronic RIGHT rotator cuff tear. Osseous demineralization. IMPRESSION: COPD changes with questionable infiltrates bilaterally. Enlargement of cardiac silhouette with pulmonary vascular congestion. Electronically Signed   By: Ulyses Southward M.D.   On: 02/14/2016 08:34   Dg Chest 2 View  02/10/2016  CLINICAL DATA:  Cough, weakness x2 days EXAM: CHEST  2 VIEW COMPARISON:  02/26/2015 FINDINGS: Mild patchy left basilar opacity, atelectasis versus pneumonia. Cardiomegaly with pulmonary vascular congestion. No frank interstitial edema. No pleural effusion or pneumothorax. Degenerative changes of the visualized thoracolumbar spine. IMPRESSION: Mild patchy left basilar opacity, atelectasis versus pneumonia. Cardiomegaly with pulmonary vascular congestion. No frank interstitial edema. Electronically Signed   By: Charline Bills M.D.   On: 02/10/2016 21:35    CBC  Recent Labs Lab 02/10/16 2223 02/11/16 0008 02/11/16 1033 02/12/16 0325 02/15/16 0240  WBC 9.2  --  8.1 6.8 7.1  HGB 11.2* 12.2 10.2* 11.2* 10.9*  HCT 33.6* 36.0 32.2* 34.9* 32.3*  PLT 189  --  186 193 216  MCV 100.0  --  98.5 98.0 97.0  MCH 33.3  --  31.2 31.5 32.7  MCHC 33.3  --  31.7 32.1 33.7  RDW 14.8  --  14.6 14.6 14.4  LYMPHSABS  --   --  1.6  --   --   MONOABS  --   --  0.5  --   --   EOSABS  --   --  0.1  --   --   BASOSABS  --   --  0.0  --   --     Chemistries   Recent Labs Lab 02/11/16 1033 02/12/16 0325 02/13/16 0311 02/14/16 0342 02/15/16 0240  NA 136 144 139 144 142  K 2.8* 3.6 3.0* 3.3* 2.7*  CL 99* 106 106 109 105  CO2 25 24 24 26 24   GLUCOSE 92 93  90 108* 87  BUN 12 9 9 11 10   CREATININE 0.64 0.67 0.63 0.69 0.65  CALCIUM 8.5* 9.0 8.8* 8.9 9.0  MG 1.7  --   --   --  2.0  AST 43*  --   --   --   --   ALT 35  --   --   --   --   ALKPHOS 69  --   --   --   --   BILITOT 1.0  --   --   --   --    ------------------------------------------------------------------------------------------------------------------ estimated creatinine clearance is 41.4 mL/min (by C-G formula based on Cr of 0.65). ------------------------------------------------------------------------------------------------------------------ No results for input(s): HGBA1C in the last 72 hours. ------------------------------------------------------------------------------------------------------------------ No results for input(s): CHOL, HDL, LDLCALC, TRIG, CHOLHDL, LDLDIRECT in the last 72 hours. ------------------------------------------------------------------------------------------------------------------ No results for input(s): TSH, T4TOTAL, T3FREE, THYROIDAB in the last 72 hours.  Invalid input(s): FREET3 ------------------------------------------------------------------------------------------------------------------ No results for input(s): VITAMINB12, FOLATE, FERRITIN, TIBC, IRON, RETICCTPCT in the last 72 hours.  Coagulation profile No results for input(s): INR, PROTIME in the last 168 hours.  No results for input(s): DDIMER in the last 72 hours.  Cardiac Enzymes No results for input(s): CKMB, TROPONINI, MYOGLOBIN in the last 168 hours.  Invalid input(s): CK ------------------------------------------------------------------------------------------------------------------ Invalid input(s): POCBNP  No results for input(s): GLUCAP in the last 72 hours.   Kaiyu Mirabal M.D. [facility] 02/15/2016, 10:39 AM  Pager: Between 7am to 7pm - call Pager - [phone]  After 7pm go to [email or web address] - password [email or web address]  Call night coverage person  covering after 7pm

## 2016-02-16 LAB — BASIC METABOLIC PANEL
Anion gap: 12 (ref 5–15)
BUN: 9 mg/dL (ref 6–20)
CHLORIDE: 103 mmol/L (ref 101–111)
CO2: 25 mmol/L (ref 22–32)
CREATININE: 0.64 mg/dL (ref 0.44–1.00)
Calcium: 9.2 mg/dL (ref 8.9–10.3)
GFR calc non Af Amer: >60 mL/min (ref >60)
GLUCOSE: 98 mg/dL (ref 65–99)
Potassium: 3.7 mmol/L (ref 3.5–5.1)
Sodium: 140 mmol/L (ref 135–145)

## 2016-02-16 MED ORDER — LEVOFLOXACIN 750 MG PO TABS: 750.0000 mg | ORAL_TABLET | Freq: Every day | ORAL | Status: DC

## 2016-02-16 MED ORDER — FUROSEMIDE 40 MG PO TABS: 40.0000 mg | ORAL_TABLET | Freq: Every day | ORAL | Status: DC

## 2016-02-16 MED ORDER — LORAZEPAM 0.5 MG PO TABS: 0.5000 mg | ORAL_TABLET | Freq: Three times a day (TID) | ORAL | Status: AC | PRN

## 2016-02-16 MED ORDER — LEVOFLOXACIN 750 MG PO TABS: 750.0000 mg | ORAL_TABLET | ORAL | Status: DC

## 2016-02-16 MED ORDER — LEVOFLOXACIN 750 MG PO TABS
750.0000 mg | ORAL_TABLET | ORAL | Status: DC
  Administered 2016-02-16: 750 mg via ORAL
  Filled 2016-02-16: qty 1

## 2016-02-16 MED ORDER — ASPIRIN 325 MG PO TBEC: 325.0000 mg | DELAYED_RELEASE_TABLET | Freq: Every day | ORAL | Status: DC

## 2016-02-16 MED ORDER — PANTOPRAZOLE SODIUM 40 MG PO TBEC: 40.0000 mg | DELAYED_RELEASE_TABLET | Freq: Every day | ORAL | Status: DC

## 2016-02-16 MED ORDER — ENSURE ENLIVE PO LIQD: 237.0000 mL | Freq: Two times a day (BID) | ORAL | Status: DC

## 2016-02-16 MED ORDER — IPRATROPIUM-ALBUTEROL 0.5-2.5 (3) MG/3ML IN SOLN: 3.0000 mL | Freq: Two times a day (BID) | RESPIRATORY_TRACT | Status: DC

## 2016-02-16 NOTE — Progress Notes (Signed)
Report called to sharon gray.

## 2016-02-16 NOTE — Progress Notes (Signed)
[facility] will [facility] Anticipated discharge date:3/16 Family notified: pt dtr- [facility] [employer] by [employer]- called at 10:50am  CSW signing off.  Merlyn Lot, LCSWA Clinical Social Worker [phone]

## 2016-02-16 NOTE — Discharge Summary (Addendum)
Physician Discharge Summary   Patient ID: Monica Blankenship MRN: [id number] DOB/AGE: 01/16/1929 82 y.o.  Admit date: 02/10/2016 Discharge date: 02/16/2016  Primary Care Physician:  Georgann Housekeeper, MD  Discharge Diagnoses:    . HCAP (health care associated pneumonia)  . Acute on Chronic diastolic CHF (congestive heart failure) (HCC) . Osteoarthritis . Anemia . Essential hypertension Hypokalemia Hypomagnesemia  Consults:  None  Recommendations for Outpatient Follow-up:  1. Please repeat CBC/BMET at next visit. Please adjust her Lasix dose. 2. Please make follow-up appointment with cardiology, Dr. Mayford Knife 3. Patient used to be on Lasix 10 mg outpatient, currently on 40 mg, Lasix dose will need to be adjusted.   DIET: Soft diet, low sodium    Allergies:   Allergies  Allergen Reactions  . Fentanyl Other (See Comments)    confusion     DISCHARGE MEDICATIONS: Current Discharge Medication List    START taking these medications   Details  aspirin EC 325 MG EC tablet Take 1 tablet (325 mg total) by mouth daily. Qty: 30 tablet, Refills: 0    feeding supplement, ENSURE ENLIVE, (ENSURE ENLIVE) LIQD Take 237 mLs by mouth 2 (two) times daily between meals. Qty: 237 mL, Refills: 12    furosemide (LASIX) 40 MG tablet Take 1 tablet (40 mg total) by mouth daily. Qty: 30 tablet    ipratropium-albuterol (DUONEB) 0.5-2.5 (3) MG/3ML SOLN Take 3 mLs by nebulization 2 (two) times daily. Qty: 360 mL    levofloxacin (LEVAQUIN) 750 MG tablet Take 1 tablet (750 mg total) by mouth every other day. X 3 doses Qty: 3 tablet, Refills: 0    pantoprazole (PROTONIX) 40 MG tablet Take 1 tablet (40 mg total) by mouth daily.      CONTINUE these medications which have CHANGED   Details  LORazepam (ATIVAN) 0.5 MG tablet Take 1 tablet (0.5 mg total) by mouth every 8 (eight) hours as needed for anxiety. Qty: 20 tablet, Refills: 0      CONTINUE these medications which have NOT CHANGED   Details   acetaminophen (TYLENOL) 325 MG tablet Take 650 mg by mouth every 8 (eight) hours. Scheduled 6a, 2p, 10p    aspirin 81 MG chewable tablet Chew 4 tablets (324 mg total) by mouth daily.    carboxymethylcellulose (REFRESH) 1 % ophthalmic solution Place 1 drop into both eyes at bedtime.     citalopram (CELEXA) 20 MG tablet Take 10 mg by mouth daily.     clopidogrel (PLAVIX) 75 MG tablet Take 1 tablet (75 mg total) by mouth daily. Qty: 30 tablet, Refills: 0    Eyelid Cleansers (OCUSOFT EYELID CLEANSING) PADS Apply 1 application topically See admin instructions. Use to clean eyelids twice daily and as needed for matted eyes    guaiFENesin (MUCINEX) 600 MG 12 hr tablet Take 600 mg by mouth 2 (two) times daily.    hydrALAZINE (APRESOLINE) 25 MG tablet Take 75 mg by mouth 3 (three) times daily. Hold for SBP <130  6am, 2pm, 10pm    lactobacillus acidophilus (BACID) TABS tablet Take 1 tablet by mouth 2 (two) times daily.     Melatonin 5 MG TABS Take 5 mg by mouth at bedtime.    Polyethyl Glycol-Propyl Glycol (SYSTANE) 0.4-0.3 % SOLN Place 1 drop into both eyes 3 (three) times daily. 9am, 1pm, 5pm    polyethylene glycol (MIRALAX / GLYCOLAX) packet Take 17 g by mouth daily. Mix in 8 oz fluid and drink    potassium chloride SA (K-DUR,KLOR-CON) 20 MEQ tablet  Take 20 mEq by mouth daily.    tamsulosin (FLOMAX) 0.4 MG CAPS capsule Take 1 capsule (0.4 mg total) by mouth daily. Qty: 30 capsule, Refills: 0    verapamil (CALAN-SR) 120 MG CR tablet Take 120 mg by mouth daily.      STOP taking these medications     amoxicillin-clavulanate (AUGMENTIN) 875-125 MG tablet      ibuprofen (ADVIL,MOTRIN) 600 MG tablet      predniSONE (DELTASONE) 50 MG tablet          Brief H and P: For complete details please refer to admission H and P, but in brief 82 year old lady with history of anxiety, insomnia, osteoarthritis, anemia, prior CVA as well as dementia, brought from rehabilitation Center with fever  or chills cough with yellow sputum for a couple weeks failing oral Augmentin. Patient was placed on IV vancomycin and cefepime, currently on cefepime alone. Patient also needed IV diuresis.  Hospital Course:   HCAP: - Presented from a rehabilitation center and will treat as healthcare associated pneumonia with vancomycin and cefepime. - Blood cultures negative to date, influenza panel negative - Urine culture remained negative - Urine strep antigen, urine legionella antigen negative -Patient was placed on vancomycin and cefepime, she received 5 days of IV antibiotics. Transitioned to oral levofloxacin for 3 days to complete the full course.  Anemia - no clinical evidence of bleed hemoglobin is stable,   Acute on chronic diastolic CHF (congestive heart failure) [facility]): Improving - chest x-ray with mild evidence of fluid overload, patient was placed on IV Lasix 2 .continue oral Lasix currently 40 mg daily,  (used to be on 10mg  outpatient- Dr ). Please decrease her Lasix dose according to renal function and clinical evaluation. 2-D echo in 2016 had shown EF of 60%  Hypokalemia / hypophosphatemia  - likely due to poor by mouth intake as well as IV diuresis - placed on supplementation.   Osteoarthritis - Continue Tylenol as needed.  Aortic stenosis  Essential hypertension - Continue hydralazine.  Day of Discharge BP 158/57 mmHg  Pulse 80  Temp(Src) 97.3 F (36.3 C) (Oral)  Resp 18  Ht 5\' 2"  (1.575 m)  Wt 57.4 kg (126 lb 8.7 oz)  BMI 23.14 kg/m2  SpO2 95%  Physical Exam: General: Alert and awake oriented x3 not in any acute distress. HEENT: anicteric sclera, pupils reactive to light and accommodation CVS: S1-S2 clear no murmur rubs or gallops Chest: clear to auscultation bilaterally, no wheezing rales or rhonchi Abdomen: soft nontender, nondistended, normal bowel sounds Extremities: no cyanosis, clubbing or edema noted bilaterally Neuro: Cranial nerves II-XII intact,  no focal neurological deficits   The results of significant diagnostics from this hospitalization (including imaging, microbiology, ancillary and laboratory) are listed below for reference.    LAB RESULTS: Basic Metabolic Panel:  Recent Labs Lab 02/11/16 1033  02/15/16 0240 02/15/16 1229 02/16/16 0420  NA 136  < > 142  --  140  K 2.8*  < > 2.7* 4.1 3.7  CL 99*  < > 105  --  103  CO2 25  < > 24  --  25  GLUCOSE 92  < > 87  --  98  BUN 12  < > 10  --  9  CREATININE 0.64  < > 0.65  --  0.64  CALCIUM 8.5*  < > 9.0  --  9.2  MG 1.7  --  2.0  --   --   PHOS 2.2*  --   --   --   --   < > =  values in this interval not displayed. Liver Function Tests:  Recent Labs Lab 02/11/16 1033  AST 43*  ALT 35  ALKPHOS 69  BILITOT 1.0  PROT 6.6  ALBUMIN 3.3*   No results for input(s): LIPASE, AMYLASE in the last 168 hours. No results for input(s): AMMONIA in the last 168 hours. CBC:  Recent Labs Lab 02/11/16 1033 02/12/16 0325 02/15/16 0240  WBC 8.1 6.8 7.1  NEUTROABS 5.9  --   --   HGB 10.2* 11.2* 10.9*  HCT 32.2* 34.9* 32.3*  MCV 98.5 98.0 97.0  PLT 186 193 216   Cardiac Enzymes: No results for input(s): CKTOTAL, CKMB, CKMBINDEX, TROPONINI in the last 168 hours. BNP: Invalid input(s): POCBNP CBG: No results for input(s): GLUCAP in the last 168 hours.  Significant Diagnostic Studies:  Dg Chest 2 View  02/10/2016  CLINICAL DATA:  Cough, weakness x2 days EXAM: CHEST  2 VIEW COMPARISON:  02/26/2015 FINDINGS: Mild patchy left basilar opacity, atelectasis versus pneumonia. Cardiomegaly with pulmonary vascular congestion. No frank interstitial edema. No pleural effusion or pneumothorax. Degenerative changes of the visualized thoracolumbar spine. IMPRESSION: Mild patchy left basilar opacity, atelectasis versus pneumonia. Cardiomegaly with pulmonary vascular congestion. No frank interstitial edema. Electronically Signed   By: Charline Bills M.D.   On: 02/10/2016 21:35       Disposition and Follow-up: Discharge Instructions    Increase activity slowly    Complete by:  As directed             DISPOSITION: Skilled nursing facility   DISCHARGE FOLLOW-UP Follow-up Information    Follow up with Georgann Housekeeper, MD. Schedule an appointment as soon as possible for a visit in 2 weeks.   Specialty:  Internal Medicine   Why:  for hospital follow-up   Contact information:   [address] [place] [address] [phone]       Follow up with Quintella Reichert, MD. Schedule an appointment as soon as possible for a visit in 2 weeks.   Specialty:  Cardiology   Why:  for hospital follow-up   Contact information:   [address] [place] [address] [phone]        Time spent on Discharge: 35 minutes  Signed:   RAI,RIPUDEEP M.D. [facility] 02/16/2016, 11:54 AM Pager: [phone]

## 2016-02-28 ENCOUNTER — Encounter: Payer: Self-pay | Admitting: Cardiology

## 2016-02-28 ENCOUNTER — Ambulatory Visit (INDEPENDENT_AMBULATORY_CARE_PROVIDER_SITE_OTHER): Payer: Medicare Other | Admitting: Cardiology

## 2016-02-28 VITALS — BP 120/70 | HR 68 | Ht 62.0 in | Wt 127.0 lb

## 2016-02-28 DIAGNOSIS — I351 Nonrheumatic aortic (valve) insufficiency: Secondary | ICD-10-CM

## 2016-02-28 DIAGNOSIS — I359 Nonrheumatic aortic valve disorder, unspecified: Secondary | ICD-10-CM

## 2016-02-28 DIAGNOSIS — I10 Essential (primary) hypertension: Secondary | ICD-10-CM

## 2016-02-28 DIAGNOSIS — R001 Bradycardia, unspecified: Secondary | ICD-10-CM

## 2016-02-28 DIAGNOSIS — I350 Nonrheumatic aortic (valve) stenosis: Secondary | ICD-10-CM

## 2016-02-28 DIAGNOSIS — I5032 Chronic diastolic (congestive) heart failure: Secondary | ICD-10-CM | POA: Diagnosis not present

## 2016-02-28 NOTE — Progress Notes (Signed)
02/28/2016 Monica Blankenship   1929-01-20  [id number]  Primary Physician Georgann Housekeeper, MD Primary Cardiologist: Dr. Mayford Knife   Reason for Visit/CC: [name] F/u for CHF  HPI:  82 y/o female, followed in the past by Dr. Mayford Knife. She has not been seen in clinic since 07/2014. She was lost to f/u. She reports to clinic today for post hospital f/u as recommended by Internal Medicine. Her PMH is notable for HTN, chronic diastolic CHF, MSSA SBE of the MV secondary to gluteal abscess. Also with a h/o bradycardia on high dose of verapamil in the past. In Dec 2015, she suffered a stroke which resulted in residual left sided weakness and vascular dementia. She is unable to self ambulate and uses a wheel chair.  She is a permanent resident at a nursing facility.   She was admitted by IM 3/10-3/16/17 for HCAP , treated with antibiotics. Blood and urine cultures were negative. She was also treated for acute on chronic diastolic CHF. CXR showed mild evidence of fluid overload. Patient was placed on IV Lasix 2. She was transitioned to oral Lasix, 40 mg daily (previously on 10 mg/day).   2D echo was obtained revealing normal LVEF of 60%. Wall motion was normal. Mild aortic stenosis was noted with mean gradient of 11 mmHg and peak gradient of 21 mmHg. There also appeared to be significant mitral stenosis and mitral regurgitation. Per echo report, "There is thickening of the leaflets with tethering of the tips. There is mitral stenosis. The doppler data is difficult to assess. The most reliable data is probably the meangradient of and peak gradient of . The data is further affected by the presence of significant MR. Compared to the data from 11/16/2014 study, I suspect there is no significant change. This is significant mitral stenosis. Doppler:   Indexed valve area by pressure half-time: 1.9 cm^2/m^2. Valve area by continuity equation (using LVOT flow): 0.67 cm^2. Indexed valve area by continuity  equation (using LVOT flow): 0.37 cm^2/m^2.  Mean gradient (D): 11 mm Hg. The left atrial is moderately to severely dilated".   She presents to clinic today with her daughters who serve as her POAs. She reports that she has done fairly well since discharge. She denies dyspnea. No chest pain. No LEE. She has been sleeping well. BP is well controlled at 120/70. EKG shows SR with PVCs. HR is stable at 67 bpm.     Current Outpatient Prescriptions  Medication Sig Dispense Refill  . acetaminophen (TYLENOL) 325 MG tablet Take 650 mg by mouth every 8 (eight) hours. Scheduled 6a, 2p, 10p    . aspirin 81 MG chewable tablet Chew 4 tablets (324 mg total) by mouth daily.    Marland Kitchen aspirin EC 325 MG EC tablet Take 1 tablet (325 mg total) by mouth daily. 30 tablet 0  . carboxymethylcellulose (REFRESH) 1 % ophthalmic solution Place 1 drop into both eyes at bedtime.     . citalopram (CELEXA) 20 MG tablet Take 10 mg by mouth daily.     . clopidogrel (PLAVIX) 75 MG tablet Take 1 tablet (75 mg total) by mouth daily. 30 tablet 0  . Eyelid Cleansers (OCUSOFT EYELID CLEANSING) PADS Apply 1 application topically See admin instructions. Use to clean eyelids twice daily and as needed for matted eyes    . feeding supplement, ENSURE ENLIVE, (ENSURE ENLIVE) LIQD Take 237 mLs by mouth 2 (two) times daily between meals. 237 mL 12  . furosemide (LASIX) 40 MG tablet Take 1 tablet (  40 mg total) by mouth daily. 30 tablet   . guaiFENesin (MUCINEX) 600 MG 12 hr tablet Take 600 mg by mouth 2 (two) times daily.    . hydrALAZINE (APRESOLINE) 25 MG tablet Take 75 mg by mouth 3 (three) times daily. Hold for SBP <130  6am, 2pm, 10pm    . ipratropium-albuterol (DUONEB) 0.5-2.5 (3) MG/3ML SOLN Take 3 mLs by nebulization 2 (two) times daily. 360 mL   . lactobacillus acidophilus (BACID) TABS tablet Take 1 tablet by mouth 2 (two) times daily.     Marland Kitchen levofloxacin (LEVAQUIN) 750 MG tablet Take 1 tablet (750 mg total) by mouth every other day. X 3  doses 3 tablet 0  . LORazepam (ATIVAN) 0.5 MG tablet Take 1 tablet (0.5 mg total) by mouth every 8 (eight) hours as needed for anxiety. 20 tablet 0  . Melatonin 5 MG TABS Take 5 mg by mouth at bedtime.    . pantoprazole (PROTONIX) 40 MG tablet Take 1 tablet (40 mg total) by mouth daily.    Bertram Gala Glycol-Propyl Glycol (SYSTANE) 0.4-0.3 % SOLN Place 1 drop into both eyes 3 (three) times daily. 9am, 1pm, 5pm    . polyethylene glycol (MIRALAX / GLYCOLAX) packet Take 17 g by mouth daily. Mix in 8 oz fluid and drink    . potassium chloride SA (K-DUR,KLOR-CON) 20 MEQ tablet Take 20 mEq by mouth daily.    . tamsulosin (FLOMAX) 0.4 MG CAPS capsule Take 1 capsule (0.4 mg total) by mouth daily. (Patient taking differently: Take 0.4 mg by mouth at bedtime. ) 30 capsule 0  . verapamil (CALAN-SR) 120 MG CR tablet Take 120 mg by mouth daily.     No current facility-administered medications for this visit.    Allergies  Allergen Reactions  . Fentanyl Other (See Comments)    confusion    Social History   Social History  . Marital Status: Married    Spouse Name: N/A  . Number of Children: N/A  . Years of Education: N/A   Occupational History  . retired    Social History Main Topics  . Smoking status: Former Smoker -- 0.25 packs/day for 1 years  . Smokeless tobacco: Never Used     Comment: smoked 1-3 cigs/day in school for about a year some days only  . Alcohol Use: No  . Drug Use: No  . Sexual Activity: Not on file   Other Topics Concern  . Not on file   Social History Narrative     Review of Systems: General: negative for chills, fever, night sweats or weight changes.  Cardiovascular: negative for chest pain, dyspnea on exertion, edema, orthopnea, palpitations, paroxysmal nocturnal dyspnea or shortness of breath Dermatological: negative for rash Respiratory: negative for cough or wheezing Urologic: negative for hematuria Abdominal: negative for nausea, vomiting, diarrhea, bright  red blood per rectum, melena, or hematemesis Neurologic: negative for visual changes, syncope, or dizziness All other systems reviewed and are otherwise negative except as noted above.    Blood pressure 120/70, pulse 68, height 5\' 2"  (1.575 m), weight 127 lb (57.607 kg).  General appearance: alert, cooperative and no distress Neck: no carotid bruit and no JVD Lungs: clear to auscultation bilaterally Heart: regular rate and rhythm and 2/6 murmur Extremities: no LEE Pulses: 2+ and symmetric Skin: warm and dry Neurologic: Grossly normal  EKG SR with PVC 67 bpm   ASSESSMENT AND PLAN:   1. Chronic Diastolic HF:  EF normal by recent echo at 60%. Per  hospital records, patient had mild volume overload and evidence on pulmonary edema on CXR. Patient appears euvolemic on physical exam today. No dyspnea. Lung exam normal. No LEE. Prior to admit, patient was on 10 mg of lasix daily but was discharged home on 40 mg/daily. Her BP is stable. We will check a BMP today to assess renal function and electrolytes. If stable, will continue on current dose. Will adjust dose if needed. We discussed importance of low sodium diet and daily weights. Patient is to notify our office if >3 lb weight gain in 24 hr or >5 lb weight gain in 1 week.   2. Aortic Stenosis: mild by recent echo. Mean gradient of 11 mmHg and peak gradient of 21 mmHg.   3. Mitral Valve Disease: Per recent echo, there appears to be significant mitral stenosis and MR. EF is normal at 60%. She is asymptomatic w/o dyspnea and chest pain. Her diastolic HF is stable. Given her advanced age, h/o stroke with residual functional impairment and vascular dementia, she is not an ideal candidate for surgery. Her family agrees (both daughters are present who both serve a POAs). We will continue medical therapy. No further w/u at this time.   4. HTN: BP is well controlled at 120/70. Continue hydralazine and lasix.   5. H/o Bradycardia: occurred in the past on  high dose CCB. Verapamil was adjusted and HR stablized. EKG today shows SR with HR of 67 bpm.  6. HCAP: patient has completed course of antibiotics. No dyspnea.   PLAN  F/u with Dr. Mayford Knife in 6 months.   Robbie Lis PA-C 02/28/2016 8:29 AM

## 2016-02-28 NOTE — Patient Instructions (Signed)
Medication Instructions:  Your physician recommends that you continue on your current medications as directed. Please refer to the Current Medication list given to you today.   Labwork: TODAY: (AT FACILITY)  BMP  Testing/Procedures: None ordered  Follow-Up: Your physician wants you to follow-up in: 6 MONTH WITH DR. Sherlyn Lick will receive a reminder letter in the mail two months in advance. If you don't receive a letter, please call our office to schedule the follow-up appointment.   Any Other Special Instructions Will Be Listed Below (If Applicable).     If you need a refill on your cardiac medications before your next appointment, please call your pharmacy.

## 2016-03-01 ENCOUNTER — Encounter: Payer: Self-pay | Admitting: Cardiology

## 2016-03-07 ENCOUNTER — Telehealth: Payer: Self-pay | Admitting: Cardiology

## 2016-03-07 NOTE — Telephone Encounter (Signed)
New Message;   She was told to take her weight everyday,how long will she need to do this?

## 2016-03-26 ENCOUNTER — Telehealth: Payer: Self-pay | Admitting: Cardiology

## 2016-03-26 NOTE — Telephone Encounter (Signed)
New message     Monica Blankenship calling regarding April 5th message .  How long should they continue daily weight.

## 2016-03-26 NOTE — Telephone Encounter (Signed)
Returned call x2. Hung up after several rings and no way to leave message.  Will try again later.

## 2016-03-27 NOTE — Telephone Encounter (Signed)
Returned call to [employer].  No answer and no way to leave a message

## 2016-08-21 ENCOUNTER — Encounter: Payer: Self-pay | Admitting: Cardiology

## 2016-09-04 ENCOUNTER — Encounter: Payer: Self-pay | Admitting: Cardiology

## 2016-09-04 ENCOUNTER — Ambulatory Visit (INDEPENDENT_AMBULATORY_CARE_PROVIDER_SITE_OTHER): Payer: Medicare Other | Admitting: Cardiology

## 2016-09-04 VITALS — BP 150/70 | HR 71 | Ht 62.0 in

## 2016-09-04 DIAGNOSIS — I10 Essential (primary) hypertension: Secondary | ICD-10-CM | POA: Diagnosis not present

## 2016-09-04 DIAGNOSIS — I050 Rheumatic mitral stenosis: Secondary | ICD-10-CM

## 2016-09-04 DIAGNOSIS — I330 Acute and subacute infective endocarditis: Secondary | ICD-10-CM

## 2016-09-04 DIAGNOSIS — I350 Nonrheumatic aortic (valve) stenosis: Secondary | ICD-10-CM | POA: Diagnosis not present

## 2016-09-04 DIAGNOSIS — I5032 Chronic diastolic (congestive) heart failure: Secondary | ICD-10-CM | POA: Diagnosis not present

## 2016-09-04 DIAGNOSIS — R079 Chest pain, unspecified: Secondary | ICD-10-CM

## 2016-09-04 NOTE — Progress Notes (Signed)
Cardiology Office Note    Date:  09/04/2016   ID:  Monica Blankenship, DOB 01/19/1929, MRN [id number]  PCP:  Georgann Housekeeper, MD  Cardiologist:  Armanda Magic, MD   Chief Complaint  Patient presents with  . Congestive Heart Failure  . Hypertension    History of Present Illness:  Monica Blankenship is a 82 y.o. female  with a history of HTN, chronic diastolic CHF,  PACs and PVCs, MSSA SBE of the MV secondary to gluteal abscess and mitral and aortic stenosis .  She has a history of syncope felt secondary to bradycardia, dehydration and hyperkalemia.  She had a CVA 2 years ago and now has dementia.   I have not seen her in 2 years.  She is now wheelchair bound due to the CVA and lives in a SNF.  She now presents today doing well from a cardiac standpoint.  She says that about 2 weeks ago she had a pressure pain that lasted a few minutes.  She told her daughter but she cannot remember everything about it.  She says if she drinks a soda she will get similar discomfort.  There was no radiation of the discomfort but it was associated with diaphoresis.  She denies any SOB or DOE. She denies any LE edema, dizziness, palpitations or syncope.    Past Medical History:  Diagnosis Date  . Allergy   . Anemia   . Anxiety   . Aortic regurgitation    mild to moderate by echo 4/14  . Aortic stenosis    mild by echo 4/14  . CKD (chronic kidney disease)   . Diastolic dysfunction   . HTN (hypertension)   . Insomnia   . Mitral stenosis    moderate MS on echo 2016  . Obesity   . Osteoarthritis   . Stroke (HCC)   . Vascular dementia     Past Surgical History:  Procedure Laterality Date  . ABDOMINAL HYSTERECTOMY    . APPENDECTOMY    . CATARACT EXTRACTION, BILATERAL    . EYE SURGERY    . TEE WITHOUT CARDIOVERSION N/A 04/13/2014   Procedure: TRANSESOPHAGEAL ECHOCARDIOGRAM (TEE);  Surgeon: Vesta Mixer, MD;  Location: [facility] ENDOSCOPY;  Service: Cardiovascular;  Laterality: N/A;    Current  Medications: Outpatient Medications Prior to Visit  Medication Sig Dispense Refill  . acetaminophen (TYLENOL) 325 MG tablet Take 650 mg by mouth every 8 (eight) hours. Scheduled 6a, 2p, 10p    . aspirin EC 325 MG EC tablet Take 1 tablet (325 mg total) by mouth daily. 30 tablet 0  . carboxymethylcellulose (REFRESH) 1 % ophthalmic solution Place 1 drop into both eyes at bedtime.     . citalopram (CELEXA) 20 MG tablet Take 10 mg by mouth daily.     . clopidogrel (PLAVIX) 75 MG tablet Take 1 tablet (75 mg total) by mouth daily. 30 tablet 0  . Eyelid Cleansers (OCUSOFT EYELID CLEANSING) PADS Apply 1 application topically See admin instructions. Use to clean eyelids twice daily and as needed for matted eyes    . feeding supplement, ENSURE ENLIVE, (ENSURE ENLIVE) LIQD Take 237 mLs by mouth 2 (two) times daily between meals. 237 mL 12  . furosemide (LASIX) 40 MG tablet Take 1 tablet (40 mg total) by mouth daily. 30 tablet   . guaiFENesin (MUCINEX) 600 MG 12 hr tablet Take 600 mg by mouth 2 (two) times daily.    . hydrALAZINE (APRESOLINE) 25 MG tablet Take 75 mg by  mouth 3 (three) times daily. Hold for SBP <130  6am, 2pm, 10pm    . ipratropium-albuterol (DUONEB) 0.5-2.5 (3) MG/3ML SOLN Take 3 mLs by nebulization 2 (two) times daily. 360 mL   . lactobacillus acidophilus (BACID) TABS tablet Take 1 tablet by mouth 2 (two) times daily.     Marland Kitchen levofloxacin (LEVAQUIN) 750 MG tablet Take 1 tablet (750 mg total) by mouth every other day. X 3 doses 3 tablet 0  . LORazepam (ATIVAN) 0.5 MG tablet Take 1 tablet (0.5 mg total) by mouth every 8 (eight) hours as needed for anxiety. 20 tablet 0  . Melatonin 5 MG TABS Take 5 mg by mouth at bedtime.    . pantoprazole (PROTONIX) 40 MG tablet Take 1 tablet (40 mg total) by mouth daily.    Bertram Gala Glycol-Propyl Glycol (SYSTANE) 0.4-0.3 % SOLN Place 1 drop into both eyes 3 (three) times daily. 9am, 1pm, 5pm    . polyethylene glycol (MIRALAX / GLYCOLAX) packet Take 17 g by  mouth daily. Mix in 8 oz fluid and drink    . potassium chloride SA (K-DUR,KLOR-CON) 20 MEQ tablet Take 20 mEq by mouth daily.    . tamsulosin (FLOMAX) 0.4 MG CAPS capsule Take 0.4 mg by mouth daily.    . verapamil (CALAN-SR) 120 MG CR tablet Take 120 mg by mouth daily.    Marland Kitchen LORazepam (ATIVAN) 0.5 MG tablet Take 0.5 mg by mouth 2 (two) times daily.     No facility-administered medications prior to visit.      Allergies:   Fentanyl   Social History   Social History  . Marital status: Married    Spouse name: N/A  . Number of children: N/A  . Years of education: N/A   Occupational History  . retired    Social History Main Topics  . Smoking status: Former Smoker    Packs/day: 0.25    Years: 1.00  . Smokeless tobacco: Never Used     Comment: smoked 1-3 cigs/day in school for about a year some days only  . Alcohol use No  . Drug use: No  . Sexual activity: Not Asked   Other Topics Concern  . None   Social History Narrative  . None     Family History:  The patient's family history includes CVA in her mother; Hypertension in her mother.   ROS:   Please see the history of present illness.    ROS All other systems reviewed and are negative.  No flowsheet data found.     PHYSICAL EXAM:   VS:  BP (!) 150/70 (BP Location: Right Arm, Patient Position: Sitting, Cuff Size: Normal)   Pulse 71   Ht 5\' 2"  (1.575 m)    GEN: Well nourished, well developed, in no acute distress  HEENT: normal  Neck: no JVD, carotid bruits, or masses Cardiac: RRR; no murmurs, rubs, or gallops,no edema.  Intact distal pulses bilaterally.  Respiratory:  clear to auscultation bilaterally, normal work of breathing GI: soft, nontender, nondistended, + BS MS: no deformity or atrophy  Skin: warm and dry, no rash Neuro:  Alert and Oriented x 3, Strength and sensation are intact Psych: euthymic mood, full affect  Wt Readings from Last 3 Encounters:  02/28/16 127 lb (57.6 kg)  02/15/16 126 lb 8.7 oz  (57.4 kg)  03/03/15 139 lb 5.3 oz (63.2 kg)      Studies/Labs Reviewed:   EKG:  EKG is not ordered today.    Recent Labs:  02/10/2016: B Natriuretic Peptide 1,260.9 02/11/2016: ALT 35 02/15/2016: Hemoglobin 10.9; Magnesium 2.0; Platelets 216 02/16/2016: BUN 9; Creatinine, Ser 0.64; Potassium 3.7; Sodium 140   Lipid Panel    Component Value Date/Time   CHOL 129 11/16/2014 0620   TRIG 69 11/16/2014 0620   HDL 40 11/16/2014 0620   CHOLHDL 3.2 11/16/2014 0620   VLDL 14 11/16/2014 0620   LDLCALC 75 11/16/2014 0620    Additional studies/ records that were reviewed today include:  none    ASSESSMENT:    1. Chronic diastolic CHF (congestive heart failure) (HCC)   2. Essential hypertension   3. Aortic valve stenosis, etiology of cardiac valve disease unspecified   4. Mitral valve stenosis, unspecified etiology   5. SBE (subacute bacterial endocarditis)   6. Chest pain, unspecified type      PLAN:  In order of problems listed above:  1. Chronic diastolic CHF - she appears euvolemic on exam today.  Continue diuretic.  2. HTN - BP controlled on current meds.  Continue hydralazine and verapamil. 3. Mild aortic stenosis - continue diuretic 4. Moderate mitral stenosis - repeat echo since it has been 2 years.  5. H/O endocarditis in the past. 6. Chest pain which is atypical and I suspect due to GERD.  Given her debilitated state and bound to a wheelchair after CVA, recommend conservative management with no further ischemic workup.  Her daughter is in agreement.      Medication Adjustments/Labs and Tests Ordered: Current medicines are reviewed at length with the patient today.  Concerns regarding medicines are outlined above.  Medication changes, Labs and Tests ordered today are listed in the Patient Instructions below.  There are no Patient Instructions on file for this visit.   Signed, Armanda Magic, MD  09/04/2016 9:06 AM    [facility] [address] [place], [place], [place]  [address] Phone: [phone]; Fax: [phone]

## 2016-09-04 NOTE — Patient Instructions (Signed)
Medication Instructions:  The current medical regimen is effective;  continue present plan and medications.  Testing/Procedures: Your physician has requested that you have an echocardiogram. Echocardiography is a painless test that uses sound waves to create images of your heart. It provides your doctor with information about the size and shape of your heart and how well your heart's chambers and valves are working. This procedure takes approximately one hour. There are no restrictions for this procedure.  Follow-Up: Follow up in 6 months with PA/NP.   You will receive a letter in the mail 2 months before you are due.  Please call us when you receive this letter to schedule your follow up appointment.  Follow up in 1 year with Dr. Mayford Knife.  You will receive a letter in the mail 2 months before you are due.  Please call us when you receive this letter to schedule your follow up appointment.  If you need a refill on your cardiac medications before your next appointment, please call your pharmacy.  Thank you for choosing  [facility]!!

## 2016-09-13 ENCOUNTER — Ambulatory Visit (HOSPITAL_COMMUNITY)
Admission: RE | Admit: 2016-09-13 | Discharge: 2016-09-13 | Disposition: A | Discharge status: Home / Self Care | Payer: Medicare Other | Source: Ambulatory Visit | Attending: Cardiology | Admitting: Cardiology

## 2016-09-13 DIAGNOSIS — I340 Nonrheumatic mitral (valve) insufficiency: Secondary | ICD-10-CM | POA: Diagnosis not present

## 2016-09-13 DIAGNOSIS — Z87891 Personal history of nicotine dependence: Secondary | ICD-10-CM | POA: Diagnosis not present

## 2016-09-13 DIAGNOSIS — I351 Nonrheumatic aortic (valve) insufficiency: Secondary | ICD-10-CM | POA: Diagnosis not present

## 2016-09-13 DIAGNOSIS — I110 Hypertensive heart disease with heart failure: Secondary | ICD-10-CM | POA: Diagnosis not present

## 2016-09-13 DIAGNOSIS — I050 Rheumatic mitral stenosis: Secondary | ICD-10-CM

## 2016-09-13 DIAGNOSIS — I429 Cardiomyopathy, unspecified: Secondary | ICD-10-CM | POA: Insufficient documentation

## 2016-09-13 DIAGNOSIS — I509 Heart failure, unspecified: Secondary | ICD-10-CM | POA: Insufficient documentation

## 2016-09-13 NOTE — Progress Notes (Signed)
*  PRELIMINARY RESULTS* Echocardiogram 2D Echocardiogram has been performed.  Monica Blankenship 09/13/2016, 10:12 AM

## 2016-10-12 IMAGING — MR MR HEAD W/O CM
9 of 11 series · 31 of 48 positions shown · non-contrast
Comparison: Prior CT performed earlier on the same day.

EXAM:
MRI HEAD WITHOUT CONTRAST

MRA HEAD WITHOUT CONTRAST
TECHNIQUE: Multiplanar, multiecho pulse sequences of the brain and surrounding
structures were obtained without intravenous contrast. Angiographic
images of the head were obtained using MRA technique without
contrast.

[Series 3: DWI · axial · 3.0mm · 1.09mm/px · z∈[-66,+63]mm · 8 of 94 slices shown (1 of 4)]
[im 1/94]
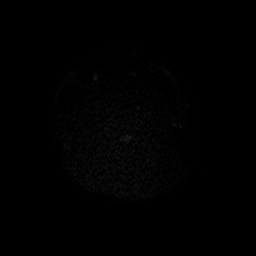
[im 11/94]
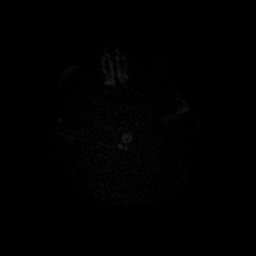
[im 32/94]
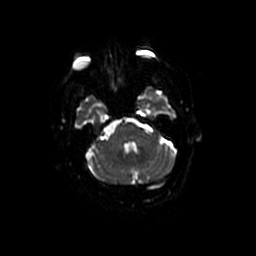
[im 42/94]
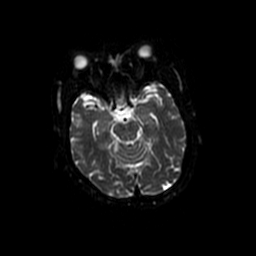
[im 52/94]
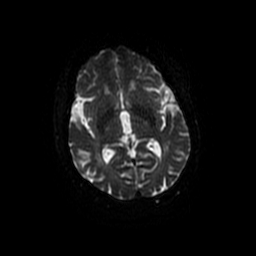
[im 63/94]
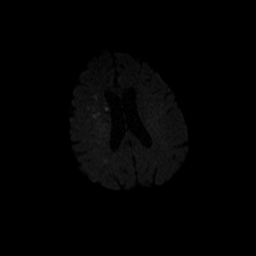
[im 83/94]
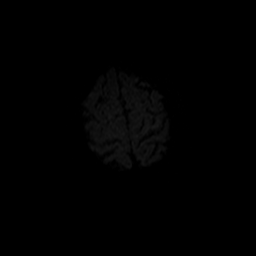
[im 94/94]
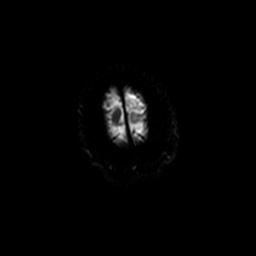

[Series 4: T1 · sagittal · 5.0mm · 0.47mm/px · 1 of 22 slices shown]
[im 1/22]
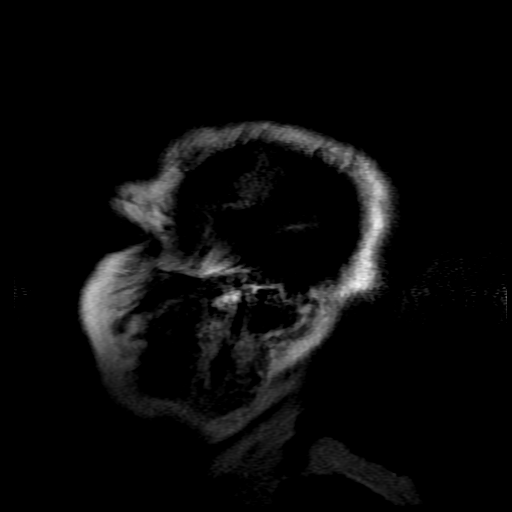

[Series 5: T2 · axial · 5.0mm · 0.43mm/px · z∈[-72,+56]mm · 2 of 24 slices shown (1 of 2)]
[im 1/24]
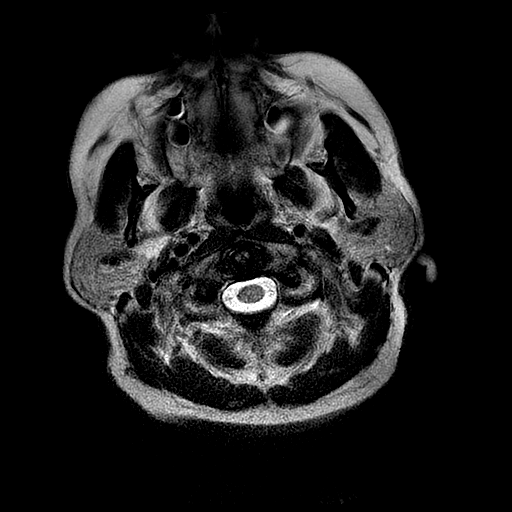
[im 24/24]
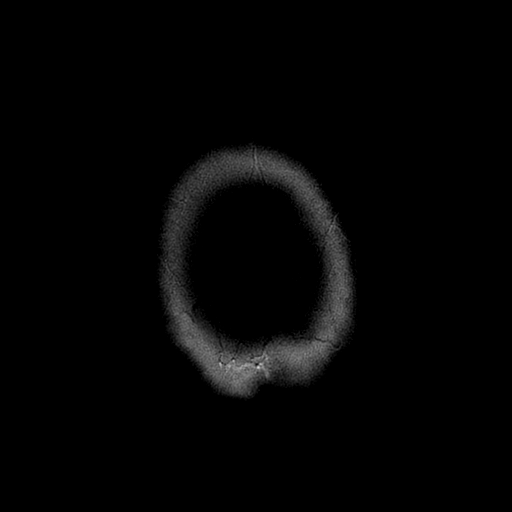

[Series 6: FLAIR · axial · 5.0mm · 0.43mm/px · z∈[-72,+56]mm · 2 of 24 slices shown (1 of 2)]
[im 1/24]
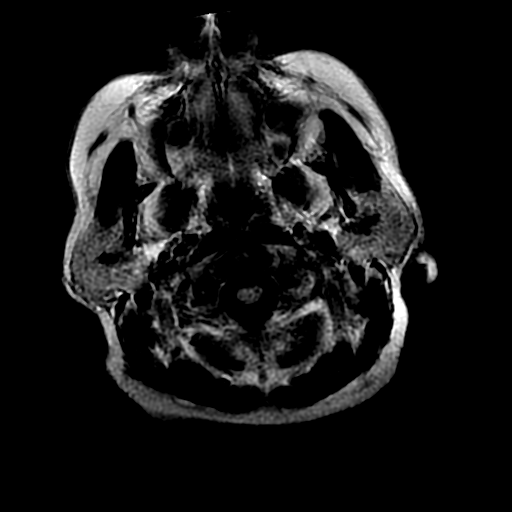
[im 24/24]
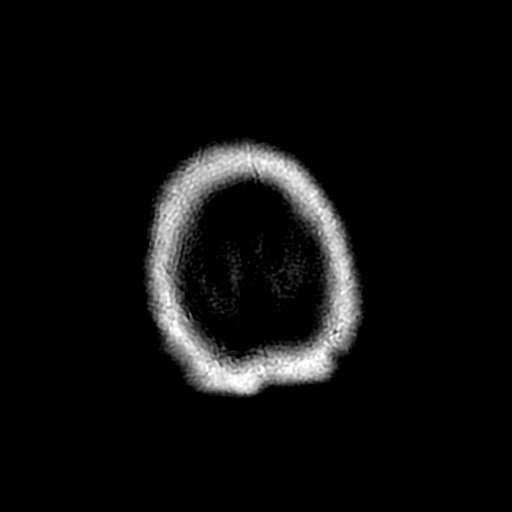

[Series 9: DWI · coronal · 5.0mm · 1.09mm/px · 6 of 64 slices shown (2 of 4)]
[im 1/64]
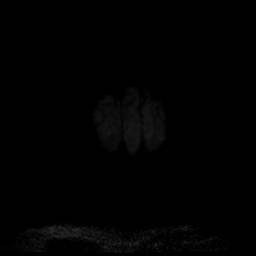
[im 13/64]
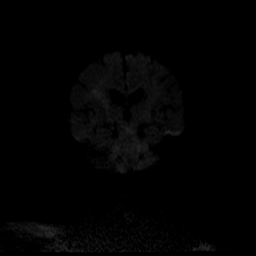
[im 26/64]
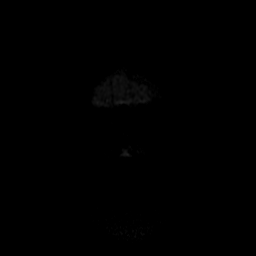
[im 38/64]
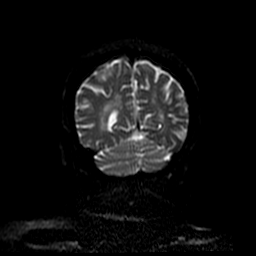
[im 51/64]
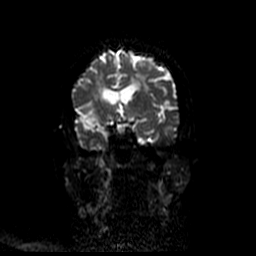
[im 64/64]
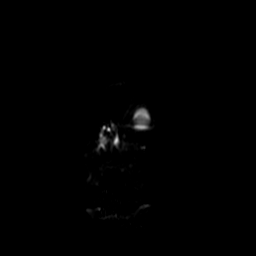

[Series 11: FLAIR · axial · 5.0mm · 0.43mm/px · z∈[-83,+59]mm · 2 of 25 slices shown (2 of 2)]
[im 1/25]
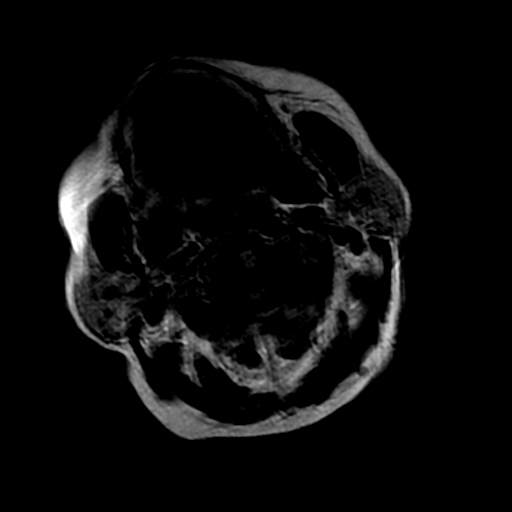
[im 25/25]
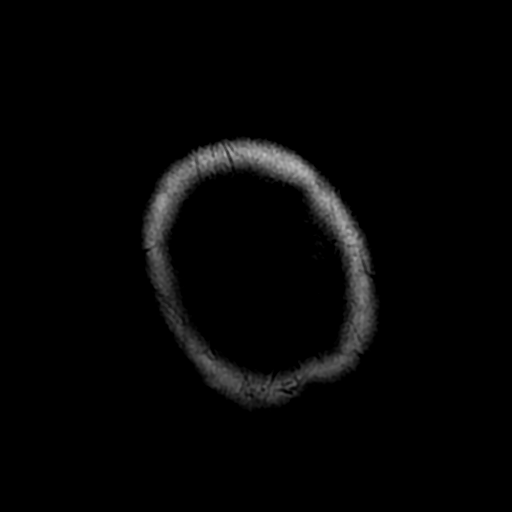

[Series 13: T2 · coronal · 5.0mm · 0.43mm/px · 3 of 28 slices shown (2 of 2)]
[im 1/28]
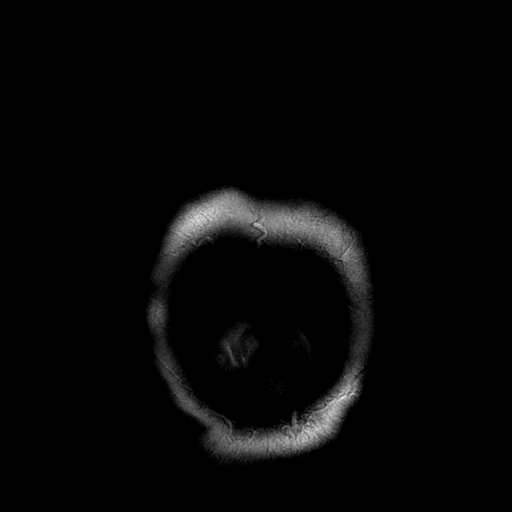
[im 14/28]
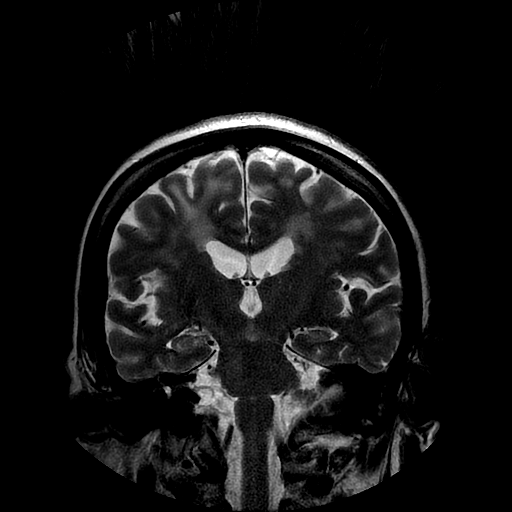
[im 28/28]
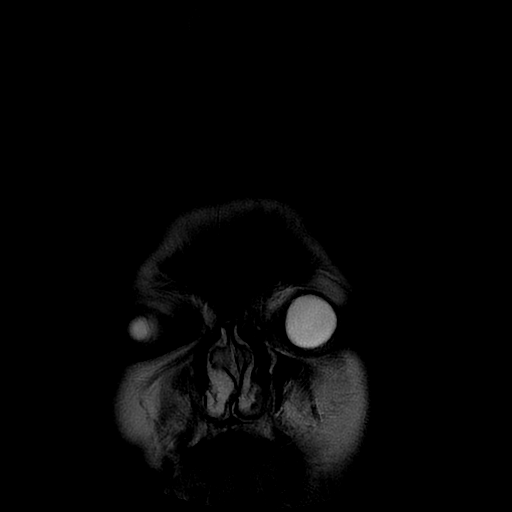

[Series 300: DWI · axial · 3.0mm · 1.09mm/px · z∈[-66,+63]mm · 4 of 47 slices shown (3 of 4)]
[im 1/47]
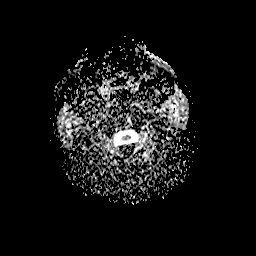
[im 16/47]
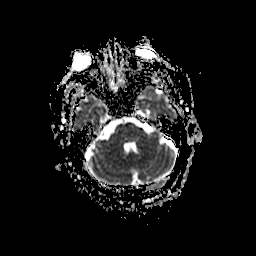
[im 31/47]
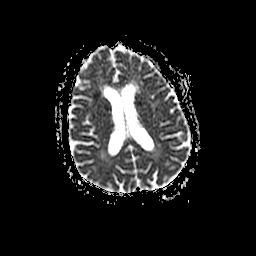
[im 47/47]
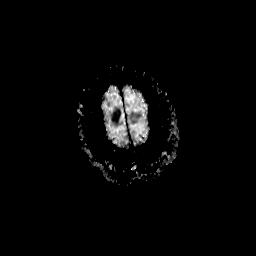

[Series 900: DWI · coronal · 5.0mm · 1.09mm/px · 3 of 32 slices shown (4 of 4)]
[im 1/32]
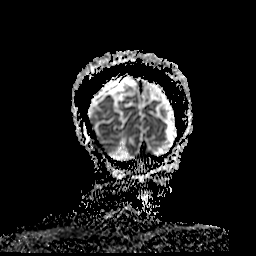
[im 16/32]
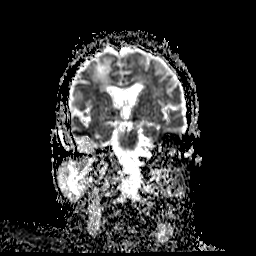
[im 32/32]
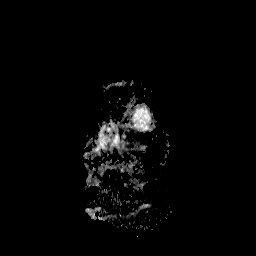

[31 of 48 positions shown; findings below may reference images not displayed]

FINDINGS: MRI HEAD FINDINGS

Study is degraded by motion artifact.

Diffuse prominence of the CSF containing spaces is compatible with
generalized cerebral atrophy. Patchy and confluent T2/FLAIR
hyperintensity within the periventricular and deep white matter both
cerebral hemispheres is most consistent with chronic small vessel
ischemic disease. Encephalomalacia within the anterior right frontal
lobe compatible with remote infarct. There is a smaller remote right
occipital lobe infarct as well.

Multiple foci of restricted diffusion are seen involving
predominantly the deep white matter of the right frontal lobe and
the right centrum semi ovale (series 3, image 34). The largest focus
of restricted diffusion measures 11 mm and is seen adjacent to the
frontal horn of the right lateral ventricle. There is a small focus
of restricted diffusion within the right basal ganglia as well.
There is involvement of the periatrial white matter on the right.
Findings are consistent with acute ischemic infarcts. No associated
hemorrhage or mass effect. These are position in somewhat of a
linear fashion within the deep white matter of the right cerebral
hemisphere, and may be watershed in nature.

No mass lesion or midline shift. No hydrocephalus. No extra-axial
fluid collection.

Craniocervical junction grossly normal. Node definite abnormality
seen within the pituitary gland. Orbits within normal limits.

Visualized bone marrow signal intensity is normal.

Paranasal sinuses and mastoid air cells are clear.

MRA HEAD FINDINGS

ANTERIOR CIRCULATION:

Visualized portions of the distal cervical segments of the internal
carotid arteries are widely patent bilaterally with antegrade flow.
Mild to moderate multi focal atherosclerotic irregularity seen
within the petrous and cavernous ICAs bilaterally without
hemodynamically significant stenosis. A 4 mm focal outpouching
arising from the lateral aspect of the cavernous left ICA on
time-of-flight sequence may reflect a small aneurysm versus artifact
(series 12, image 61). This is directed laterally and slightly
posteriorly.

A1 segments, anterior communicating artery, and anterior cerebral
arteries are opacified bilaterally.

There is a severe high-grade stenosis measuring 4 mm in length
within the mid aspect of the right M1 segment (series 12, image 7).
Right M1 segment is opacified distally, although flow within the
distal right MCA branches is attenuated as compared to the left.

Mild multi focal atherosclerotic irregularity seen within the left
MCA artery and its branches without hemodynamically significant
stenosis or proximal branch occlusion.

POSTERIOR CIRCULATION:

Distal vertebral arteries are patent bilaterally. Posterior inferior
cerebral arteries not well evaluated on this exam. Vertebrobasilar
junction normal. Multi focal atherosclerotic irregularity seen
within the basilar artery without hemodynamically significant
stenosis. Superior cerebellar arteries patent bilaterally. Multi
focal atherosclerotic irregularity seen within the PCAs bilaterally,
left slightly worse than right. There is associated probable several
areas of moderate stenosis within the left P2 segment.
IMPRESSION: MRI HEAD IMPRESSION:

1. Multi focal ischemic infarcts involving the right MCA territory
distribution. These infarcts are largely located in a watershed
distribution involving the deep white matter of the right centrum
semi ovale. No associated hemorrhage or significant mass effect.
2. Remote right frontal and right occipital lobe infarcts.
3. Atrophy with moderate chronic microvascular ischemic disease.

MRA HEAD IMPRESSION:

1. Focal severe high-grade stenosis measuring approximately 4 mm in
length within the mid right M1 segment. Flow is attenuated within
the distal right MCA artery branches.
2. Additional multi focal atherosclerotic irregularity throughout
the intracranial circulation.
3. 4 mm somewhat linear focal outpouching arising from the lateral
aspect of the cavernous left ICA. This may reflect artifact versus a
small aneurysm. A follow-up study in 3 months to document stability
may be helpful for further evaluation.

## 2017-03-05 ENCOUNTER — Ambulatory Visit (INDEPENDENT_AMBULATORY_CARE_PROVIDER_SITE_OTHER): Payer: Medicare Other | Admitting: Cardiology

## 2017-03-05 ENCOUNTER — Encounter: Payer: Self-pay | Admitting: Cardiology

## 2017-03-05 VITALS — BP 118/58 | HR 65 | Ht 62.0 in

## 2017-03-05 DIAGNOSIS — I10 Essential (primary) hypertension: Secondary | ICD-10-CM

## 2017-03-05 DIAGNOSIS — Z79899 Other long term (current) drug therapy: Secondary | ICD-10-CM | POA: Diagnosis not present

## 2017-03-05 DIAGNOSIS — I5032 Chronic diastolic (congestive) heart failure: Secondary | ICD-10-CM

## 2017-03-05 DIAGNOSIS — I050 Rheumatic mitral stenosis: Secondary | ICD-10-CM | POA: Diagnosis not present

## 2017-03-05 NOTE — Patient Instructions (Addendum)
Medication Instructions:  Your physician recommends that you continue on your current medications as directed. Please refer to the Current Medication list given to you today.   Labwork: TODAY:  CBC & BMET  Testing/Procedures: Your physician has requested that you have an echocardiogram IN 6 MONTHS.  Echocardiography is a painless test that uses sound waves to create images of your heart. It provides your doctor with information about the size and shape of your heart and how well your heart's chambers and valves are working. This procedure takes approximately one hour. There are no restrictions for this procedure.   Follow-Up: Your physician wants you to follow-up in: 6 MONTHS WITH DR. Sherlyn Lick will receive a reminder letter in the mail two months in advance. If you don't receive a letter, please call our office to schedule the follow-up appointment.   Any Other Special Instructions Will Be Listed Below (If Applicable).  Echocardiogram An echocardiogram, or echocardiography, uses sound waves (ultrasound) to produce an image of your heart. The echocardiogram is simple, painless, obtained within a short period of time, and offers valuable information to your health care provider. The images from an echocardiogram can provide information such as:  Evidence of coronary artery disease (CAD).  Heart size.  Heart muscle function.  Heart valve function.  Aneurysm detection.  Evidence of a past heart attack.  Fluid buildup around the heart.  Heart muscle thickening.  Assess heart valve function. Tell a health care provider about:  Any allergies you have.  All medicines you are taking, including vitamins, herbs, eye drops, creams, and over-the-counter medicines.  Any problems you or family members have had with anesthetic medicines.  Any blood disorders you have.  Any surgeries you have had.  Any medical conditions you have.  Whether you are pregnant or may be  pregnant. What happens before the procedure? No special preparation is needed. Eat and drink normally. What happens during the procedure?  In order to produce an image of your heart, gel will be applied to your chest and a wand-like tool (transducer) will be moved over your chest. The gel will help transmit the sound waves from the transducer. The sound waves will harmlessly bounce off your heart to allow the heart images to be captured in real-time motion. These images will then be recorded.  You may need an IV to receive a medicine that improves the quality of the pictures. What happens after the procedure? You may return to your normal schedule including diet, activities, and medicines, unless your health care provider tells you otherwise. This information is not intended to replace advice given to you by your health care provider. Make sure you discuss any questions you have with your health care provider. Document Released: 11/16/2000 Document Revised: 07/07/2016 Document Reviewed: 07/27/2013 Elsevier Interactive Patient Education  2017 [employer].  If you need a refill on your cardiac medications before your next appointment, please call your pharmacy.

## 2017-03-05 NOTE — Progress Notes (Signed)
03/05/2017 Monica Blankenship   04-Jun-1929  [id number]  Primary Physician Georgann Housekeeper, MD Primary Cardiologist: Dr. Mayford Knife    Reason for Visit/CC:  6 month f/u for chronic diastolic HF, PACs/PVCs, valvular disease   HPI:  Monica Blankenship presents to clinic today for f/u. She is a 83 y/o female, followed by Dr. Mayford Knife. 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 Also with a h/o CVA and dementia. She has a past h/o syncope, in the setting of bradycardia, dehydration and hyperkalemia. She is wheelchair bound and resides at a SNF. She was last seen by Dr. Mayford Knife 09/2016 and was doing well at that time. Dr. Mayford Knife did however order a f/u surveillance 2D Echo. This was performed 09/13/16 and showed normal EF at 55-60% and normal wall motion. Mild MS and mild MR noted. The aortic valve had severely calcified leaflets with mild AI. These findings were stable, compared to previous study in 2016. Dr. Mayford Knife recommended repeat echo in 1 year (Oct. 2018)  She now presents back to clinic for routine 6 month f/u.   She is here today with her 2 daughters. She reports she has done well from a cardiac standpoint. She denies CP. No dyspnea. No LEE. She sleeps well and has a good appetite.    Current Meds  Medication Sig  . acetaminophen (TYLENOL) 325 MG tablet Take 650 mg by mouth every 8 (eight) hours. Scheduled 6a, 2p, 10p  . aspirin EC 325 MG EC tablet Take 1 tablet (325 mg total) by mouth daily.  . carboxymethylcellulose (REFRESH) 1 % ophthalmic solution Place 1 drop into both eyes at bedtime.   . citalopram (CELEXA) 20 MG tablet Take 10 mg by mouth daily.   . clopidogrel (PLAVIX) 75 MG tablet Take 1 tablet (75 mg total) by mouth daily.  . Eyelid Cleansers (OCUSOFT EYELID CLEANSING) PADS Apply 1 application topically See admin instructions. Use to clean eyelids twice daily and as needed for matted eyes  . feeding  supplement, ENSURE ENLIVE, (ENSURE ENLIVE) LIQD Take 237 mLs by mouth 2 (two) times daily between meals.  . furosemide (LASIX) 40 MG tablet Take 1 tablet (40 mg total) by mouth daily.  . hydrALAZINE (APRESOLINE) 25 MG tablet Take 75 mg by mouth 3 (three) times daily. Hold for SBP <130  6am, 2pm, 10pm  . ipratropium-albuterol (DUONEB) 0.5-2.5 (3) MG/3ML SOLN Take 3 mLs by nebulization 2 (two) times daily.  Marland Kitchen lactobacillus acidophilus (BACID) TABS tablet Take 1 tablet by mouth 2 (two) times daily.   Marland Kitchen LORazepam (ATIVAN) 0.5 MG tablet Take 1 tablet (0.5 mg total) by mouth every 8 (eight) hours as needed for anxiety.  . Melatonin 5 MG TABS Take 5 mg by mouth at bedtime.  . pantoprazole (PROTONIX) 40 MG tablet Take 1 tablet (40 mg total) by mouth daily.  Bertram Gala Glycol-Propyl Glycol (SYSTANE) 0.4-0.3 % SOLN Place 1 drop into both eyes 3 (three) times daily. 9am, 1pm, 5pm  . polyethylene glycol (MIRALAX / GLYCOLAX) packet Take 17 g by mouth daily. Mix in 8 oz fluid and drink  . potassium chloride SA (K-DUR,KLOR-CON) 20 MEQ tablet Take 20 mEq by mouth daily.  . tamsulosin (FLOMAX) 0.4 MG CAPS capsule Take 0.4 mg by mouth daily.  . verapamil (CALAN-SR) 120 MG CR tablet Take 120 mg by mouth daily.   Allergies  Allergen Reactions  . Fentanyl Other (See Comments)  confusion   Past Medical History:  Diagnosis Date  . Allergy   . Anemia   . Anxiety   . Aortic regurgitation    mild to moderate by echo 4/14  . Aortic stenosis    mild by echo 4/14  . CKD (chronic kidney disease)   . Diastolic dysfunction   . HTN (hypertension)   . Insomnia   . Mitral stenosis    moderate MS on echo 2016  . Obesity   . Osteoarthritis   . Stroke (HCC)   . Vascular dementia    Family History  Problem Relation Age of Onset  . Hypertension Mother   . CVA Mother    Past Surgical History:  Procedure Laterality Date  . ABDOMINAL HYSTERECTOMY    . APPENDECTOMY    . CATARACT EXTRACTION, BILATERAL    .  EYE SURGERY    . TEE WITHOUT CARDIOVERSION N/A 04/13/2014   Procedure: TRANSESOPHAGEAL ECHOCARDIOGRAM (TEE);  Surgeon: Vesta Mixer, MD;  Location: [facility] ENDOSCOPY;  Service: Cardiovascular;  Laterality: N/A;   Social History   Social History  . Marital status: Married    Spouse name: N/A  . Number of children: N/A  . Years of education: N/A   Occupational History  . retired    Social History Main Topics  . Smoking status: Former Smoker    Packs/day: 0.25    Years: 1.00  . Smokeless tobacco: Never Used     Comment: smoked 1-3 cigs/day in school for about a year some days only  . Alcohol use No  . Drug use: No  . Sexual activity: Not on file   Other Topics Concern  . Not on file   Social History Narrative  . No narrative on file     Review of Systems: General: negative for chills, fever, night sweats or weight changes.  Cardiovascular: negative for chest pain, dyspnea on exertion, edema, orthopnea, palpitations, paroxysmal nocturnal dyspnea or shortness of breath Dermatological: negative for rash Respiratory: negative for cough or wheezing Urologic: negative for hematuria Abdominal: negative for nausea, vomiting, diarrhea, bright red blood per rectum, melena, or hematemesis Neurologic: negative for visual changes, syncope, or dizziness All other systems reviewed and are otherwise negative except as noted above.   Physical Exam:  Blood pressure (!) 118/58, pulse 65, height 5\' 2"  (1.575 m).  General appearance: alert, cooperative, no distress and elderly and frail, wheelchair bound Neck: no carotid bruit and no JVD Lungs: clear to auscultation bilaterally Heart: regular rate and rhythm, S1, S2 normal, no murmur, click, rub or gallop Extremities: extremities normal, atraumatic, no cyanosis or edema Pulses: 2+ and symmetric Skin: Skin color, texture, turgor normal. No rashes or lesions Neurologic: Grossly normal  EKG NSR. LVH w/ QRS widening (unchanged from previous).  65 bpm  ASSESSMENT AND PLAN:   1. Chronic Diastolic HF: euvolemic on exam. She denies dyspnea. Continue Lasix and supplemental K. Check BMP for renal function and electrolyte monitoring.   2. HTN: well controlled on current regimen, 118/58.   3. Aortic Valve Disease: severely calcified leaflets with mild AI noted on echo 09/2016. Asymptomatic. Repeat 2D echo 09/2017.   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 Asymptomatic. Repeat 2D echo 09/2017.   5. 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    PLAN  f/u with Dr. 10/2017 in 6 months w/ plans to repeat 2D echo.   Mayford Knife PA-C 03/05/2017 2:48 PM

## 2017-03-06 LAB — BASIC METABOLIC PANEL
BUN/Creatinine Ratio: 18 (ref 12–28)
BUN: 12 mg/dL (ref 8–27)
CALCIUM: 9.1 mg/dL (ref 8.7–10.3)
CO2: 23 mmol/L (ref 18–29)
Chloride: 99 mmol/L (ref 96–106)
Creatinine, Ser: 0.68 mg/dL (ref 0.57–1.00)
GFR, EST AFRICAN AMERICAN: 92 mL/min/{1.73_m2} (ref >59)
GFR, EST NON AFRICAN AMERICAN: 80 mL/min/{1.73_m2} (ref >59)
Glucose: 94 mg/dL (ref 65–99)
Potassium: 3.8 mmol/L (ref 3.5–5.2)
Sodium: 140 mmol/L (ref 134–144)

## 2017-03-06 LAB — CBC
HEMATOCRIT: 33.3 % — AB (ref 34.0–46.6)
Hemoglobin: 11.2 g/dL (ref 11.1–15.9)
MCH: 32.5 pg (ref 26.6–33.0)
MCHC: 33.6 g/dL (ref 31.5–35.7)
MCV: 97 fL (ref 79–97)
Platelets: 261 10*3/uL (ref 150–379)
RBC: 3.45 x10E6/uL — AB (ref 3.77–5.28)
RDW: 14.2 % (ref 12.3–15.4)
WBC: 7.9 10*3/uL (ref 3.4–10.8)

## 2017-09-10 ENCOUNTER — Other Ambulatory Visit: Payer: Self-pay

## 2017-09-10 ENCOUNTER — Ambulatory Visit (HOSPITAL_COMMUNITY): Payer: Medicare Other | Attending: Internal Medicine

## 2017-09-10 DIAGNOSIS — I050 Rheumatic mitral stenosis: Secondary | ICD-10-CM | POA: Diagnosis not present

## 2017-09-10 DIAGNOSIS — I083 Combined rheumatic disorders of mitral, aortic and tricuspid valves: Secondary | ICD-10-CM | POA: Insufficient documentation

## 2017-09-12 ENCOUNTER — Telehealth: Payer: Self-pay | Admitting: Cardiology

## 2017-09-12 NOTE — Telephone Encounter (Signed)
New message ° ° ° °Pt is returning call about results. °

## 2017-09-12 NOTE — Telephone Encounter (Signed)
-----   Message from Allayne Butcher, [place] sent at 09/11/2017  5:09 PM EDT ----- Cardiac ultrasound looks ok. Mitral valve is stable. No significant change from previous. Pump function is normal.

## 2017-09-12 NOTE — Telephone Encounter (Signed)
Returned [place], [place] on file, call. She has been made awre of pts echo results. See result note.

## 2018-08-08 ENCOUNTER — Encounter (HOSPITAL_COMMUNITY): Payer: Self-pay

## 2018-08-08 ENCOUNTER — Observation Stay (HOSPITAL_COMMUNITY)
Admission: EM | Admit: 2018-08-08 | Discharge: 2018-08-11 | Disposition: A | Discharge status: Home / Self Care | Payer: Medicare Other | Attending: Internal Medicine | Admitting: Internal Medicine

## 2018-08-08 ENCOUNTER — Other Ambulatory Visit: Payer: Self-pay

## 2018-08-08 ENCOUNTER — Emergency Department (HOSPITAL_COMMUNITY): Payer: Medicare Other

## 2018-08-08 DIAGNOSIS — F32A Depression, unspecified: Secondary | ICD-10-CM | POA: Diagnosis present

## 2018-08-08 DIAGNOSIS — Z7189 Other specified counseling: Secondary | ICD-10-CM

## 2018-08-08 DIAGNOSIS — D599 Acquired hemolytic anemia, unspecified: Secondary | ICD-10-CM | POA: Diagnosis not present

## 2018-08-08 DIAGNOSIS — I5032 Chronic diastolic (congestive) heart failure: Secondary | ICD-10-CM | POA: Diagnosis present

## 2018-08-08 DIAGNOSIS — D649 Anemia, unspecified: Secondary | ICD-10-CM | POA: Diagnosis present

## 2018-08-08 DIAGNOSIS — K219 Gastro-esophageal reflux disease without esophagitis: Secondary | ICD-10-CM | POA: Diagnosis present

## 2018-08-08 DIAGNOSIS — F329 Major depressive disorder, single episode, unspecified: Secondary | ICD-10-CM | POA: Diagnosis not present

## 2018-08-08 DIAGNOSIS — I130 Hypertensive heart and chronic kidney disease with heart failure and stage 1 through stage 4 chronic kidney disease, or unspecified chronic kidney disease: Secondary | ICD-10-CM | POA: Insufficient documentation

## 2018-08-08 DIAGNOSIS — I639 Cerebral infarction, unspecified: Secondary | ICD-10-CM | POA: Diagnosis present

## 2018-08-08 DIAGNOSIS — Z87891 Personal history of nicotine dependence: Secondary | ICD-10-CM | POA: Insufficient documentation

## 2018-08-08 DIAGNOSIS — R001 Bradycardia, unspecified: Principal | ICD-10-CM

## 2018-08-08 DIAGNOSIS — I10 Essential (primary) hypertension: Secondary | ICD-10-CM | POA: Diagnosis present

## 2018-08-08 DIAGNOSIS — F419 Anxiety disorder, unspecified: Secondary | ICD-10-CM | POA: Diagnosis present

## 2018-08-08 DIAGNOSIS — Z79899 Other long term (current) drug therapy: Secondary | ICD-10-CM | POA: Insufficient documentation

## 2018-08-08 DIAGNOSIS — Z8673 Personal history of transient ischemic attack (TIA), and cerebral infarction without residual deficits: Secondary | ICD-10-CM | POA: Insufficient documentation

## 2018-08-08 DIAGNOSIS — F0390 Unspecified dementia without behavioral disturbance: Secondary | ICD-10-CM | POA: Diagnosis not present

## 2018-08-08 DIAGNOSIS — Z7982 Long term (current) use of aspirin: Secondary | ICD-10-CM | POA: Insufficient documentation

## 2018-08-08 DIAGNOSIS — I442 Atrioventricular block, complete: Secondary | ICD-10-CM | POA: Diagnosis present

## 2018-08-08 DIAGNOSIS — N189 Chronic kidney disease, unspecified: Secondary | ICD-10-CM | POA: Insufficient documentation

## 2018-08-08 DIAGNOSIS — Z515 Encounter for palliative care: Secondary | ICD-10-CM

## 2018-08-08 DIAGNOSIS — I441 Atrioventricular block, second degree: Secondary | ICD-10-CM

## 2018-08-08 DIAGNOSIS — I509 Heart failure, unspecified: Secondary | ICD-10-CM | POA: Diagnosis not present

## 2018-08-08 DIAGNOSIS — R4182 Altered mental status, unspecified: Secondary | ICD-10-CM | POA: Diagnosis present

## 2018-08-08 LAB — I-STAT TROPONIN, ED: Troponin i, poc: 0.04 ng/mL (ref 0.00–0.08)

## 2018-08-08 LAB — COMPREHENSIVE METABOLIC PANEL
ALK PHOS: 79 U/L (ref 38–126)
ALT: 13 U/L (ref 0–44)
ANION GAP: 12 (ref 5–15)
AST: 16 U/L (ref 15–41)
Albumin: 3.5 g/dL (ref 3.5–5.0)
BILIRUBIN TOTAL: 0.9 mg/dL (ref 0.3–1.2)
BUN: 20 mg/dL (ref 8–23)
CALCIUM: 9 mg/dL (ref 8.9–10.3)
CO2: 23 mmol/L (ref 22–32)
Chloride: 105 mmol/L (ref 98–111)
Creatinine, Ser: 1.18 mg/dL — ABNORMAL HIGH (ref 0.44–1.00)
GFR calc Af Amer: 47 mL/min — ABNORMAL LOW (ref >60)
GFR, EST NON AFRICAN AMERICAN: 41 mL/min — AB (ref >60)
Glucose, Bld: 97 mg/dL (ref 70–99)
POTASSIUM: 4 mmol/L (ref 3.5–5.1)
Sodium: 140 mmol/L (ref 135–145)
TOTAL PROTEIN: 7.8 g/dL (ref 6.5–8.1)

## 2018-08-08 LAB — CBC WITH DIFFERENTIAL/PLATELET
Abs Immature Granulocytes: 0 10*3/uL (ref 0.0–0.1)
Basophils Absolute: 0.1 10*3/uL (ref 0.0–0.1)
Basophils Relative: 1 %
EOS ABS: 0.1 10*3/uL (ref 0.0–0.7)
EOS PCT: 2 %
HEMATOCRIT: 32.4 % — AB (ref 36.0–46.0)
HEMOGLOBIN: 10.8 g/dL — AB (ref 12.0–15.0)
Immature Granulocytes: 0 %
LYMPHS ABS: 2.5 10*3/uL (ref 0.7–4.0)
Lymphocytes Relative: 41 %
MCH: 32.9 pg (ref 26.0–34.0)
MCHC: 33.3 g/dL (ref 30.0–36.0)
MCV: 98.8 fL (ref 78.0–100.0)
MONO ABS: 0.6 10*3/uL (ref 0.1–1.0)
MONOS PCT: 10 %
Neutro Abs: 2.8 10*3/uL (ref 1.7–7.7)
Neutrophils Relative %: 46 %
Platelets: 293 10*3/uL (ref 150–400)
RBC: 3.28 MIL/uL — ABNORMAL LOW (ref 3.87–5.11)
RDW: 13.5 % (ref 11.5–15.5)
WBC: 6.1 10*3/uL (ref 4.0–10.5)

## 2018-08-08 LAB — MAGNESIUM: MAGNESIUM: 2.5 mg/dL — AB (ref 1.7–2.4)

## 2018-08-08 LAB — PHOSPHORUS: Phosphorus: 4 mg/dL (ref 2.5–4.6)

## 2018-08-08 LAB — BRAIN NATRIURETIC PEPTIDE: B Natriuretic Peptide: 370.5 pg/mL — ABNORMAL HIGH (ref 0.0–100.0)

## 2018-08-08 MED ORDER — ONDANSETRON HCL 4 MG PO TABS: 4.0000 mg | ORAL_TABLET | Freq: Four times a day (QID) | ORAL | Status: DC | PRN

## 2018-08-08 MED ORDER — ACETAMINOPHEN 650 MG RE SUPP: 650.0000 mg | Freq: Four times a day (QID) | RECTAL | Status: DC | PRN

## 2018-08-08 MED ORDER — ASPIRIN EC 325 MG PO TBEC
325.0000 mg | DELAYED_RELEASE_TABLET | Freq: Every day | ORAL | Status: DC
  Administered 2018-08-09: 325 mg via ORAL
  Filled 2018-08-08: qty 1

## 2018-08-08 MED ORDER — CLOPIDOGREL BISULFATE 75 MG PO TABS
75.0000 mg | ORAL_TABLET | Freq: Every day | ORAL | Status: DC
  Administered 2018-08-09: 75 mg via ORAL
  Filled 2018-08-08 (×2): qty 1

## 2018-08-08 MED ORDER — FUROSEMIDE 40 MG PO TABS
40.0000 mg | ORAL_TABLET | Freq: Every day | ORAL | Status: DC
  Administered 2018-08-09: 40 mg via ORAL
  Filled 2018-08-08 (×2): qty 1

## 2018-08-08 MED ORDER — FAMOTIDINE 20 MG PO TABS
20.0000 mg | ORAL_TABLET | Freq: Two times a day (BID) | ORAL | Status: DC
  Administered 2018-08-08 – 2018-08-09 (×3): 20 mg via ORAL
  Filled 2018-08-08 (×4): qty 1

## 2018-08-08 MED ORDER — MUSCLE RUB 10-15 % EX CREA
TOPICAL_CREAM | Freq: Two times a day (BID) | CUTANEOUS | Status: DC
  Administered 2018-08-08 – 2018-08-09 (×2): via TOPICAL
  Administered 2018-08-09: 1 via TOPICAL
  Administered 2018-08-10 – 2018-08-11 (×2): via TOPICAL
  Filled 2018-08-08: qty 85

## 2018-08-08 MED ORDER — ENOXAPARIN SODIUM 40 MG/0.4ML ~~LOC~~ SOLN
40.0000 mg | SUBCUTANEOUS | Status: DC
  Administered 2018-08-09 – 2018-08-10 (×2): 40 mg via SUBCUTANEOUS
  Filled 2018-08-08 (×2): qty 0.4

## 2018-08-08 MED ORDER — ALBUTEROL SULFATE (2.5 MG/3ML) 0.083% IN NEBU: 2.5000 mg | INHALATION_SOLUTION | RESPIRATORY_TRACT | Status: DC | PRN

## 2018-08-08 MED ORDER — HYDRALAZINE HCL 50 MG PO TABS
75.0000 mg | ORAL_TABLET | Freq: Three times a day (TID) | ORAL | Status: DC
  Administered 2018-08-08 – 2018-08-09 (×3): 75 mg via ORAL
  Filled 2018-08-08 (×5): qty 1

## 2018-08-08 MED ORDER — POLYVINYL ALCOHOL-POVIDONE PF 1.4-0.6 % OP SOLN: 1.0000 [drp] | Freq: Every day | OPHTHALMIC | Status: DC

## 2018-08-08 MED ORDER — CITALOPRAM HYDROBROMIDE 10 MG PO TABS
10.0000 mg | ORAL_TABLET | Freq: Every day | ORAL | Status: DC
  Administered 2018-08-09: 10 mg via ORAL
  Filled 2018-08-08 (×2): qty 1

## 2018-08-08 MED ORDER — ZOLPIDEM TARTRATE 5 MG PO TABS: 5.0000 mg | ORAL_TABLET | Freq: Every evening | ORAL | Status: DC | PRN

## 2018-08-08 MED ORDER — LORAZEPAM 0.5 MG PO TABS
0.5000 mg | ORAL_TABLET | Freq: Two times a day (BID) | ORAL | Status: DC
  Administered 2018-08-08 – 2018-08-09 (×2): 0.5 mg via ORAL
  Filled 2018-08-08 (×2): qty 1

## 2018-08-08 MED ORDER — HYDRALAZINE HCL 20 MG/ML IJ SOLN: 5.0000 mg | INTRAMUSCULAR | Status: DC | PRN

## 2018-08-08 MED ORDER — POLYVINYL ALCOHOL 1.4 % OP SOLN
1.0000 [drp] | OPHTHALMIC | Status: DC
  Administered 2018-08-09 – 2018-08-11 (×7): 1 [drp] via OPHTHALMIC
  Filled 2018-08-08: qty 15

## 2018-08-08 MED ORDER — MELATONIN 3 MG PO TABS
4.5000 mg | ORAL_TABLET | Freq: Every day | ORAL | Status: DC
  Administered 2018-08-08 – 2018-08-10 (×3): 4.5 mg via ORAL
  Filled 2018-08-08 (×3): qty 1.5

## 2018-08-08 MED ORDER — ADULT MULTIVITAMIN W/MINERALS CH
1.0000 | ORAL_TABLET | Freq: Every day | ORAL | Status: DC
  Administered 2018-08-09: 1 via ORAL
  Filled 2018-08-08: qty 1

## 2018-08-08 MED ORDER — ACETAMINOPHEN 325 MG PO TABS: 650.0000 mg | ORAL_TABLET | Freq: Four times a day (QID) | ORAL | Status: DC | PRN

## 2018-08-08 MED ORDER — ONDANSETRON HCL 4 MG/2ML IJ SOLN: 4.0000 mg | Freq: Four times a day (QID) | INTRAMUSCULAR | Status: DC | PRN

## 2018-08-08 NOTE — Plan of Care (Signed)
  Problem: Education: Goal: Knowledge of General Education information will improve Description Including pain rating scale, medication(s)/side effects and non-pharmacologic comfort measures Outcome: Progressing   Problem: Clinical Measurements: Goal: Respiratory complications will improve Outcome: Completed/Met

## 2018-08-08 NOTE — ED Provider Notes (Signed)
[facility] EMERGENCY DEPARTMENT Provider Note   CSN: [id number] Arrival date & time:        History   Chief Complaint Chief Complaint  Patient presents with  . Bradycardia    HPI Monica Blankenship is a 84 y.o. female history of CKD, stroke with left-sided weakness and bedbound, dementia here presenting with bradycardia, altered mental status.  Patient is demented at baseline and is bedbound and is from a nursing home.  Patient does have a DNR in place.  Patient was noted to be more altered than usual and was noted to be bradycardic at the nursing home.  Patient denies any chest pain or shortness of breath.  She had both daughters at bedside who states that she is seemed to be more confused than usual.  Patient was unable to give me much history due to her dementia.  The history is provided by the EMS personnel and a relative.   Level V caveat- dementia   Past Medical History:  Diagnosis Date  . Allergy   . Anemia   . Anxiety   . Aortic regurgitation    mild to moderate by echo 4/14  . Aortic stenosis    mild by echo 4/14  . CKD (chronic kidney disease)   . Diastolic dysfunction   . HTN (hypertension)   . Insomnia   . Mitral stenosis    moderate MS on echo 2016  . Obesity   . Osteoarthritis   . Stroke (HCC)   . Vascular dementia     Patient Active Problem List   Diagnosis Date Noted  . Palliative care encounter 03/02/2015  . Decreased appetite 03/02/2015  . Weakness 03/02/2015  . Agitation 03/02/2015  . Catheter-associated urinary tract infection (HCC) 02/28/2015  . Vascular dementia 02/28/2015  . Protein-calorie malnutrition, severe (HCC) 02/28/2015  . UTI (urinary tract infection) 02/27/2015  . Chest pain 02/26/2015  . UTI (lower urinary tract infection) 02/26/2015  . Foley catheter in place on admission 02/26/2015  . Falls 02/26/2015  . Swelling of left hand 02/26/2015  . Atypical chest pain   . Complicated UTI (urinary tract infection)    . Essential hypertension   . Disorientation   . Urinary retention 12/28/2014  . Altered mental status 12/14/2014  . Acute encephalopathy 12/14/2014  . Hypokalemia 12/14/2014  . Left-sided weakness 11/15/2014  . Slurring of speech 11/15/2014  . Stroke (HCC) 11/15/2014  . SBE (subacute bacterial endocarditis) 04/13/2014  . Delirium 04/10/2014  . Sepsis (HCC) 04/08/2014  . Hyperkalemia 04/08/2014  . Severe sepsis(995.92) 04/08/2014  . Lactic acid acidosis 04/08/2014  . Encephalopathy acute 04/08/2014  . Complex sleep apnea syndrome 03/01/2014  . Syncope 02/08/2014  . Nocturnal hypoxemia 01/08/2014  . Bradycardia 11/17/2013  . CAP (community acquired pneumonia) 11/05/2013  . Obstructive chronic bronchitis without exacerbation (HCC) 11/05/2013  . Daytime somnolence 11/05/2013  . Pulmonary HTN (HCC) 10/21/2013  . Chronic diastolic CHF (congestive heart failure) (HCC) 10/01/2013  . Allergy   . Anemia   . Anxiety   . Insomnia   . Osteoarthritis   . Obesity   . CKD (chronic kidney disease)   . Mitral stenosis   . Aortic stenosis   . Aortic regurgitation     Past Surgical History:  Procedure Laterality Date  . ABDOMINAL HYSTERECTOMY    . APPENDECTOMY    . CATARACT EXTRACTION, BILATERAL    . EYE SURGERY    . TEE WITHOUT CARDIOVERSION N/A 04/13/2014   Procedure: TRANSESOPHAGEAL ECHOCARDIOGRAM (  TEE);  Surgeon: Vesta Mixer, MD;  Location: [facility] ENDOSCOPY;  Service: Cardiovascular;  Laterality: N/A;     OB History   None      Home Medications    Prior to Admission medications   Medication Sig Start Date End Date Taking? Authorizing Provider  aspirin EC 325 MG EC tablet Take 1 tablet (325 mg total) by mouth daily. 02/16/16  Yes Rai, Ripudeep K, MD  citalopram (CELEXA) 20 MG tablet Take 10 mg by mouth daily.    Yes [provider]  clopidogrel (PLAVIX) 75 MG tablet Take 1 tablet (75 mg total) by mouth daily. 11/17/14  Yes Rhetta Mura, MD  famotidine  (PEPCID) 20 MG tablet Take 20 mg by mouth every 12 (twelve) hours.   Yes [provider]  furosemide (LASIX) 40 MG tablet Take 1 tablet (40 mg total) by mouth daily. 02/16/16  Yes Rai, Ripudeep K, MD  hydrALAZINE (APRESOLINE) 25 MG tablet Take 75 mg by mouth See admin instructions. Take 3 tablets (75 mg) by mouth every 8 hours, hold for SBP <105   Yes [provider]  LORazepam (ATIVAN) 0.5 MG tablet Take 1 tablet (0.5 mg total) by mouth every 8 (eight) hours as needed for anxiety. Patient taking differently: Take 0.5 mg by mouth every 12 (twelve) hours.  02/16/16  Yes Rai, Ripudeep K, MD  Melatonin 5 MG TABS Take 5 mg by mouth at bedtime.   Yes [provider]  Menthol, Topical Analgesic, (BIOFREEZE EX) Apply 1 application topically 2 (two) times daily. Apply to left shoulder   Yes [provider]  Multiple Vitamin (MULTIVITAMIN WITH MINERALS) TABS tablet Take 1 tablet by mouth daily.   Yes [provider]  Polyethyl Glycol-Propyl Glycol (SYSTANE) 0.4-0.3 % SOLN Place 1 drop into both eyes 3 (three) times daily. 9am, 1pm, 5pm   Yes [provider]  polyethylene glycol (MIRALAX / GLYCOLAX) packet Take 17 g by mouth daily. Mix in 4-8 oz fluid and drink   Yes [provider]  Polyvinyl Alcohol-Povidone PF (REFRESH) 1.4-0.6 % SOLN Place 1 drop into both eyes at bedtime.   Yes [provider]  potassium chloride SA (K-DUR,KLOR-CON) 20 MEQ tablet Take 20 mEq by mouth daily.   Yes [provider]  verapamil (CALAN-SR) 120 MG CR tablet Take 120 mg by mouth daily.   Yes [provider]  feeding supplement, ENSURE ENLIVE, (ENSURE ENLIVE) LIQD Take 237 mLs by mouth 2 (two) times daily between meals. Patient not taking: Reported on 08/08/2018 02/16/16   Rai, Delene Ruffini, MD  ipratropium-albuterol (DUONEB) 0.5-2.5 (3) MG/3ML SOLN Take 3 mLs by nebulization 2 (two) times daily. Patient not taking: Reported on 08/08/2018 02/16/16    Cathren Harsh, MD    Family History Family History  Problem Relation Age of Onset  . Hypertension Mother   . CVA Mother     Social History Social History   Tobacco Use  . Smoking status: Former Smoker    Packs/day: 0.25    Years: 1.00    Pack years: 0.25  . Smokeless tobacco: Never Used  . Tobacco comment: smoked 1-3 cigs/day in school for about a year some days only  Substance Use Topics  . Alcohol use: No  . Drug use: No     Allergies   Fentanyl   Review of Systems Review of Systems  Psychiatric/Behavioral: Positive for confusion.  All other systems reviewed and are negative.    Physical Exam Updated Vital  Signs BP (!) 164/50   Pulse (!) 37   Temp 98.1 F (36.7 C) (Oral)   Resp 13   SpO2 96%   Physical Exam  Constitutional: She appears well-developed.  HENT:  Head: Normocephalic.  Eyes: Pupils are equal, round, and reactive to light. Conjunctivae and EOM are normal.  Neck: Normal range of motion. Neck supple.  Cardiovascular:  Bradycardic   Pulmonary/Chest: Effort normal and breath sounds normal.  Abdominal: Soft. Bowel sounds are normal.  Musculoskeletal: Normal range of motion.  Neurological: She is alert.  A & O x 1. Confused. Strength 4/5 L arm and leg (chronic), 5/5 R side   Skin: Skin is warm.  Psychiatric:  Unable   Nursing note and vitals reviewed.    ED Treatments / Results  Labs (all labs ordered are listed, but only abnormal results are displayed) Labs Reviewed  CBC WITH DIFFERENTIAL/PLATELET - Abnormal; Notable for the following components:      Result Value   RBC 3.28 (*)    Hemoglobin 10.8 (*)    HCT 32.4 (*)    All other components within normal limits  COMPREHENSIVE METABOLIC PANEL - Abnormal; Notable for the following components:   Creatinine, Ser 1.18 (*)    GFR calc non Af Amer 41 (*)    GFR calc Af Amer 47 (*)    All other components within normal limits  MAGNESIUM - Abnormal; Notable for the following  components:   Magnesium 2.5 (*)    All other components within normal limits  BRAIN NATRIURETIC PEPTIDE - Abnormal; Notable for the following components:   B Natriuretic Peptide 370.5 (*)    All other components within normal limits  PHOSPHORUS  I-STAT TROPONIN, ED    EKG EKG Interpretation  Date/Time:  Friday August 08 2018 19:09:04 EDT Ventricular Rate:  35 PR Interval:    QRS Duration: 138 QT Interval:  582 QTC Calculation: 445 R Axis:   -25 Text Interpretation:  Sinus rhythm with 2:! AV block  Left bundle branch block Confirmed by Richardean Canal [phone]) on 08/08/2018 7:10:34 PM   Radiology Dg Chest Port 1 View  Result Date: 08/08/2018 CLINICAL DATA:  Bradycardia. EXAM: PORTABLE CHEST 1 VIEW COMPARISON:  02/14/2016. FINDINGS: The cardiac silhouette remains mildly enlarged. The interstitial markings remain mildly prominent. Decreased prominence of the pulmonary vasculature. No pleural fluid or airspace consolidation. Diffuse osteopenia. Bilateral shoulder degenerative changes and superior migration of the humeral head compatible with chronic bilateral rotator cuff tears. IMPRESSION: 1. No acute abnormality. 2. Stable mild cardiomegaly, mild pulmonary vascular congestion and mild chronic interstitial lung disease with no definite interstitial pulmonary edema. Electronically Signed   By: Beckie Salts M.D.   On: 08/08/2018 18:47    Procedures Procedures (including critical care time)  Medications Ordered in ED Medications - No data to display   Initial Impression / Assessment and Plan / ED Course  I have reviewed the triage vital signs and the nursing notes.  Pertinent labs & imaging results that were available during my care of the patient were reviewed by me and considered in my medical decision making (see chart for details).     Monica Blankenship is a 84 y.o. female here with bradycardia. She is more confused than usual. Initial rhythm showed junctional vs complete heart  block. Repeat EKG showed complete heart block. Third EKG showed 2:1 AV block. Will check electrolytes. Will consult cardiology.   9:02 PM I talked to Dr. Clarnce Flock from cardiology. He states  that given that patient is DNR and demented, wouldn't recommend pacemaker. I did have extensive discussion with daughters, who are the POA. They agreed for observation and no pacemaker for now.    Final Clinical Impressions(s) / ED Diagnoses   Final diagnoses:  None    ED Discharge Orders    None       Charlynne Pander, MD 08/08/18 2104

## 2018-08-08 NOTE — ED Notes (Signed)
Pt placed on zoll pads 

## 2018-08-08 NOTE — ED Notes (Addendum)
Family notified of pt floor and room number. RN attempted to notify [facility] of [facility] with no answer.

## 2018-08-08 NOTE — ED Notes (Signed)
Dentures removed and cleaned

## 2018-08-08 NOTE — ED Notes (Signed)
Niu, MD at bedside. °

## 2018-08-08 NOTE — H&P (Signed)
History and Physical    Monica Blankenship ESL:[id number] DOB: [birth year] DOA: 08/08/2018  Referring MD/NP/PA:   PCP: Georgann Housekeeper, MD   Patient coming from:  The patient is coming from SNF.  At baseline, pt is dependent for most of ADL.  Chief Complaint: bradycardia  HPI: Monica Blankenship is a 84 y.o. female with medical history significant of dementia, hypertension, asthma, stroke with left-sided weakness, GERD, depression with anxiety, dCHF, who presents with bradycardia.  Per her 2 daughters, pt has dementia. At baseline, she sometimes can recognize some family members, but is not oriented to time and place all the time.  Today her mental status is at her baseline. Per report, pt was noted to have bradycardia by staff in facility, heart rate was down to 30s.  Not sure if patient has any chest pain, but per her daughters, patient does not seem to have chest pain, shortness of breath.  Patient does not have active cough, nausea, vomiting.  She has left-sided weakness from previous stroke, but no new facial droop or slurred speech noted.  Patient has mild diarrhea, but she is MiraLAX.  ED Course: pt was found to have bradycardia with heart rate 30-40s, no tachypnea, oxygen saturation 96% on room air, temperature normal, BNP 370, negative troponin, creatinine 1.18, chest x-ray showed mild cardiomegaly and mild vascular congestion.  Patient is placed on telemetry bed for observation.  Review of Systems: Cannot be reviewed due to dementia.  Allergy:  Allergies  Allergen Reactions  . Fentanyl Other (See Comments)    confusion    Past Medical History:  Diagnosis Date  . Allergy   . Anemia   . Anxiety   . Aortic regurgitation    mild to moderate by echo 4/14  . Aortic stenosis    mild by echo 4/14  . CKD (chronic kidney disease)   . Diastolic dysfunction   . HTN (hypertension)   . Insomnia   . Mitral stenosis    moderate MS on echo 2016  . Obesity   . Osteoarthritis   . Stroke  (HCC)   . Vascular dementia     Past Surgical History:  Procedure Laterality Date  . ABDOMINAL HYSTERECTOMY    . APPENDECTOMY    . CATARACT EXTRACTION, BILATERAL    . EYE SURGERY    . TEE WITHOUT CARDIOVERSION N/A 04/13/2014   Procedure: TRANSESOPHAGEAL ECHOCARDIOGRAM (TEE);  Surgeon: Vesta Mixer, MD;  Location: [facility] ENDOSCOPY;  Service: Cardiovascular;  Laterality: N/A;    Social History:  reports that she has quit smoking. She has a 0.25 pack-year smoking history. She has never used smokeless tobacco. She reports that she does not drink alcohol or use drugs.  Family History:  Family History  Problem Relation Age of Onset  . Hypertension Mother   . CVA Mother      Prior to Admission medications   Medication Sig Start Date End Date Taking? Authorizing Provider  aspirin EC 325 MG EC tablet Take 1 tablet (325 mg total) by mouth daily. 02/16/16  Yes Rai, Ripudeep K, MD  citalopram (CELEXA) 20 MG tablet Take 10 mg by mouth daily.    Yes [provider]  clopidogrel (PLAVIX) 75 MG tablet Take 1 tablet (75 mg total) by mouth daily. 11/17/14  Yes Rhetta Mura, MD  famotidine (PEPCID) 20 MG tablet Take 20 mg by mouth every 12 (twelve) hours.   Yes [provider]  furosemide (LASIX) 40 MG tablet Take 1 tablet (40 mg  total) by mouth daily. 02/16/16  Yes Rai, Ripudeep K, MD  hydrALAZINE (APRESOLINE) 25 MG tablet Take 75 mg by mouth See admin instructions. Take 3 tablets (75 mg) by mouth every 8 hours, hold for SBP <105   Yes [provider]  LORazepam (ATIVAN) 0.5 MG tablet Take 1 tablet (0.5 mg total) by mouth every 8 (eight) hours as needed for anxiety. Patient taking differently: Take 0.5 mg by mouth every 12 (twelve) hours.  02/16/16  Yes Rai, Ripudeep K, MD  Melatonin 5 MG TABS Take 5 mg by mouth at bedtime.   Yes [provider]  Menthol, Topical Analgesic, (BIOFREEZE EX) Apply 1 application topically 2 (two) times daily. Apply to left shoulder    Yes [provider]  Multiple Vitamin (MULTIVITAMIN WITH MINERALS) TABS tablet Take 1 tablet by mouth daily.   Yes [provider]  Polyethyl Glycol-Propyl Glycol (SYSTANE) 0.4-0.3 % SOLN Place 1 drop into both eyes 3 (three) times daily. 9am, 1pm, 5pm   Yes [provider]  polyethylene glycol (MIRALAX / GLYCOLAX) packet Take 17 g by mouth daily. Mix in 4-8 oz fluid and drink   Yes [provider]  Polyvinyl Alcohol-Povidone PF (REFRESH) 1.4-0.6 % SOLN Place 1 drop into both eyes at bedtime.   Yes [provider]  potassium chloride SA (K-DUR,KLOR-CON) 20 MEQ tablet Take 20 mEq by mouth daily.   Yes [provider]  verapamil (CALAN-SR) 120 MG CR tablet Take 120 mg by mouth daily.   Yes [provider]  feeding supplement, ENSURE ENLIVE, (ENSURE ENLIVE) LIQD Take 237 mLs by mouth 2 (two) times daily between meals. Patient not taking: Reported on 08/08/2018 02/16/16   Rai, Delene Ruffini, MD  ipratropium-albuterol (DUONEB) 0.5-2.5 (3) MG/3ML SOLN Take 3 mLs by nebulization 2 (two) times daily. Patient not taking: Reported on 08/08/2018 02/16/16   Cathren Harsh, MD    Physical Exam: Vitals:   08/08/18 2145 08/08/18 2230 08/08/18 2301 08/09/18 0608  BP: (!) 160/48 (!) 154/52 (!) 169/69 (!) 157/53  Pulse: (!) 36 (!) 33 (!) 49 (!) 40  Resp: 13 14 12 13   Temp:   98.6 F (37 C) (!) 97.4 F (36.3 C)  TempSrc:   Oral Oral  SpO2: 97%  97% 100%  Weight:   59.6 kg   Height:   5\' 6"  (1.676 m)    General: Not in acute distress HEENT:       Eyes: PERRL, EOMI, no scleral icterus.       ENT: No discharge from the ears and nose, no pharynx injection, no tonsillar enlargement.        Neck: No JVD, no bruit, no mass felt. Heme: No neck lymph node enlargement. Cardiac: S1/S2, RRR, No murmurs, No gallops or rubs. Respiratory: No rales, wheezing, rhonchi or rubs. GI: Soft, nondistended, nontender, no rebound pain, no organomegaly, BS present. GU:  No hematuria Ext: No pitting leg edema bilaterally. 2+DP/PT pulse bilaterally. Musculoskeletal: No joint deformities, No joint redness or warmth, no limitation of ROM in spin. Skin: No rashes.  Neuro: Alert, not oriented X3, cranial nerves II-XII grossly intact, has left arm weakness. Psych: Patient is not psychotic, no suicidal or hemocidal ideation.  Labs on Admission: I have personally reviewed following labs and imaging studies  CBC: Recent Labs  Lab 08/08/18 1822 08/09/18 0328  WBC 6.1 6.3  NEUTROABS 2.8  --   HGB 10.8* 10.2*  HCT 32.4* 31.6*  MCV 98.8 97.2  PLT 293  237   Basic Metabolic Panel: Recent Labs  Lab 08/08/18 1822 08/09/18 0328  NA 140 140  K 4.0 3.8  CL 105 106  CO2 23 23  GLUCOSE 97 94  BUN 20 18  CREATININE 1.18* 1.06*  CALCIUM 9.0 9.2  MG 2.5*  --   PHOS 4.0  --    GFR: Estimated Creatinine Clearance: 35.7 mL/min (A) (by C-G formula based on SCr of 1.06 mg/dL (H)). Liver Function Tests: Recent Labs  Lab 08/08/18 1822  AST 16  ALT 13  ALKPHOS 79  BILITOT 0.9  PROT 7.8  ALBUMIN 3.5   No results for input(s): LIPASE, AMYLASE in the last 168 hours. No results for input(s): AMMONIA in the last 168 hours. Coagulation Profile: No results for input(s): INR, PROTIME in the last 168 hours. Cardiac Enzymes: No results for input(s): CKTOTAL, CKMB, CKMBINDEX, TROPONINI in the last 168 hours. BNP (last 3 results) No results for input(s): PROBNP in the last 8760 hours. HbA1C: No results for input(s): HGBA1C in the last 72 hours. CBG: No results for input(s): GLUCAP in the last 168 hours. Lipid Profile: No results for input(s): CHOL, HDL, LDLCALC, TRIG, CHOLHDL, LDLDIRECT in the last 72 hours. Thyroid Function Tests: Recent Labs    08/09/18 0328  TSH 0.909  FREET4 1.01   Anemia Panel: No results for input(s): VITAMINB12, FOLATE, FERRITIN, TIBC, IRON, RETICCTPCT in the last 72 hours. Urine analysis:    Component Value Date/Time    COLORURINE YELLOW 02/11/2016 1409   APPEARANCEUR CLEAR 02/11/2016 1409   LABSPEC 1.013 02/11/2016 1409   PHURINE 6.0 02/11/2016 1409   GLUCOSEU NEGATIVE 02/11/2016 1409   HGBUR NEGATIVE 02/11/2016 1409   BILIRUBINUR NEGATIVE 02/11/2016 1409   KETONESUR NEGATIVE 02/11/2016 1409   PROTEINUR NEGATIVE 02/11/2016 1409   UROBILINOGEN 1.0 07/23/2015 1809   NITRITE NEGATIVE 02/11/2016 1409   LEUKOCYTESUR NEGATIVE 02/11/2016 1409   Sepsis Labs: @LABRCNTIP (procalcitonin:4,lacticidven:4) ) Recent Results (from the past 240 hour(s))  MRSA PCR Screening     Status: Abnormal   Collection Time: 08/08/18 11:07 PM  Result Value Ref Range Status   MRSA by PCR POSITIVE (A) NEGATIVE Final    Comment:        The GeneXpert MRSA Assay (FDA approved for NASAL specimens only), is one component of a comprehensive MRSA colonization surveillance program. It is not intended to diagnose MRSA infection nor to guide or monitor treatment for MRSA infections. RESULT CALLED TO, READ BACK BY AND VERIFIED WITH11/06/19 RN Etheleen Mayhew 08/09/18 A BROWNING Performed at [facility], [address] [place], [place]      Radiological Exams on Admission: Dg Chest Port 1 View  Result Date: 08/08/2018 CLINICAL DATA:  Bradycardia. EXAM: PORTABLE CHEST 1 VIEW COMPARISON:  02/14/2016. FINDINGS: The cardiac silhouette remains mildly enlarged. The interstitial markings remain mildly prominent. Decreased prominence of the pulmonary vasculature. No pleural fluid or airspace consolidation. Diffuse osteopenia. Bilateral shoulder degenerative changes and superior migration of the humeral head compatible with chronic bilateral rotator cuff tears. IMPRESSION: 1. No acute abnormality. 2. Stable mild cardiomegaly, mild pulmonary vascular congestion and mild chronic interstitial lung disease with no definite interstitial pulmonary edema. Electronically Signed   By: 02/16/2016 M.D.   On: 08/08/2018 18:47     EKG:  Independently reviewed. Initial EKG seems to be junctional rhythm versus third-degree AV block.  Second EKG showed complete heart block.  3rd EKG showed Third EKG showed 2:1 AV block. Also has LBBB. QTc  476.  Assessment/Plan Principal Problem:   Bradycardia Active Problems:   Anemia   Anxiety   Chronic diastolic CHF (congestive heart failure) (HCC)   Stroke (HCC)   Essential hypertension   Complete heart block (HCC)   Depression   GERD (gastroesophageal reflux disease)   Bradycardia due to complete heart block: EDP discussed with Dr. Clarnce Flock from cardiology. Given that patient is DNR and demented, he wouldn't recommend pacemaker. Both EDP and I have had extensive discussion with her 2 daughters, who are the POA. They agreed for observation. They do not have to have pacemaker and no Atropine should be given for now.   -will place on tele bed for obs -check TSH, Free T4/T4 -hold verapamil -consult palliative care  Anemia: Hgb stable, 10.8 -f/u by CBC  Chronic diastolic CHF: 2D echo on 09/10/2017 showed EF 60-65%. BNP 370.  Patient does not have leg edema.  No respiratory distress.  CHF seems to be compensated. -To continue home Lasix 40 mg daily  Depression and anxiety:  -Continue home medications  Stroke [facility]): -ASA  Essential hypertension: -hold verapamil -IV hydralazine as needed. -Patient is on Lasix, hydralazine  GERD: -Pepcid    DVT ppx: SQ Lovenox Code Status: DNR (I discussed with patient's two daughters, explained the meaning of CODE STATUS. Per her daughters, patient would want to be DNR) Family Communication:  Yes, patient's 2 daughters   at bed side Disposition Plan:  Anticipate discharge back to previous SNF Consults called:  EDP discussed with card fellow Admission status: Obs / tele    Date of Service 08/09/2018    Lorretta Harp [facility] Pager [phone]  If 7PM-7AM, please contact night-coverage [email or web address] Password [facility] 08/09/2018, 6:37  AM

## 2018-08-08 NOTE — ED Triage Notes (Signed)
Per [facility], pt coming from [facility] in [place] for bradycardia. Staff reports pt HR in 30's, but that pt at baseline mentally. Pt hx of dementia. EMS reports EKG unremarkable.

## 2018-08-09 DIAGNOSIS — I442 Atrioventricular block, complete: Secondary | ICD-10-CM

## 2018-08-09 DIAGNOSIS — R001 Bradycardia, unspecified: Secondary | ICD-10-CM | POA: Diagnosis not present

## 2018-08-09 DIAGNOSIS — F32A Depression, unspecified: Secondary | ICD-10-CM | POA: Diagnosis present

## 2018-08-09 DIAGNOSIS — I5032 Chronic diastolic (congestive) heart failure: Secondary | ICD-10-CM

## 2018-08-09 DIAGNOSIS — K219 Gastro-esophageal reflux disease without esophagitis: Secondary | ICD-10-CM | POA: Diagnosis present

## 2018-08-09 DIAGNOSIS — F329 Major depressive disorder, single episode, unspecified: Secondary | ICD-10-CM | POA: Diagnosis present

## 2018-08-09 LAB — BASIC METABOLIC PANEL
ANION GAP: 11 (ref 5–15)
BUN: 18 mg/dL (ref 8–23)
CO2: 23 mmol/L (ref 22–32)
Calcium: 9.2 mg/dL (ref 8.9–10.3)
Chloride: 106 mmol/L (ref 98–111)
Creatinine, Ser: 1.06 mg/dL — ABNORMAL HIGH (ref 0.44–1.00)
GFR calc Af Amer: 54 mL/min — ABNORMAL LOW (ref >60)
GFR, EST NON AFRICAN AMERICAN: 46 mL/min — AB (ref >60)
GLUCOSE: 94 mg/dL (ref 70–99)
POTASSIUM: 3.8 mmol/L (ref 3.5–5.1)
Sodium: 140 mmol/L (ref 135–145)

## 2018-08-09 LAB — TSH: TSH: 0.909 u[IU]/mL (ref 0.350–4.500)

## 2018-08-09 LAB — CBC
HEMATOCRIT: 31.6 % — AB (ref 36.0–46.0)
HEMOGLOBIN: 10.2 g/dL — AB (ref 12.0–15.0)
MCH: 31.4 pg (ref 26.0–34.0)
MCHC: 32.3 g/dL (ref 30.0–36.0)
MCV: 97.2 fL (ref 78.0–100.0)
Platelets: 237 10*3/uL (ref 150–400)
RBC: 3.25 MIL/uL — AB (ref 3.87–5.11)
RDW: 13.3 % (ref 11.5–15.5)
WBC: 6.3 10*3/uL (ref 4.0–10.5)

## 2018-08-09 LAB — MRSA PCR SCREENING: MRSA by PCR: POSITIVE — AB

## 2018-08-09 LAB — T4, FREE: Free T4: 1.01 ng/dL (ref 0.82–1.77)

## 2018-08-09 MED ORDER — LORAZEPAM 0.5 MG PO TABS
0.5000 mg | ORAL_TABLET | Freq: Three times a day (TID) | ORAL | Status: DC | PRN
  Filled 2018-08-09: qty 1

## 2018-08-09 MED ORDER — LORAZEPAM 2 MG/ML IJ SOLN
0.5000 mg | Freq: Three times a day (TID) | INTRAMUSCULAR | Status: DC | PRN
  Administered 2018-08-09 – 2018-08-11 (×2): 0.5 mg via INTRAVENOUS
  Filled 2018-08-09 (×2): qty 1

## 2018-08-09 MED ORDER — AMLODIPINE BESYLATE 5 MG PO TABS: 5.0000 mg | ORAL_TABLET | Freq: Every day | ORAL | Status: DC

## 2018-08-09 NOTE — Progress Notes (Signed)
Palliative Medicine RN Note: Consult order noted. Every effort will be made to see [name], but due to weekend staffing levels, she may be ready for discharge before we have an available provider. If she does discharge before we see her, we recommend a referral to community based palliative care at the facility so they can continue GOC discussions.  If this this her course, then the order for community based palliative care will need to be put in the d/c orders AND in the d/c summary in order for SNF staff to initiate the consult.  Monica Blankenship Dorrian Doggett, RN, BSN, [facility] Palliative Medicine Team 08/09/2018 9:24 AM Office [phone]

## 2018-08-09 NOTE — Progress Notes (Addendum)
PROGRESS NOTE    Monica Blankenship  DJS:[id number] DOB: [birth year] DOA: 08/08/2018 PCP: Georgann Housekeeper, MD  Brief Narrative: Monica Blankenship is a 84 y.o. female with medical history significant of dementia, hypertension, asthma, stroke with left-sided weakness, bed bound, GERD, depression with anxiety, dCHF, who presented with bradycardia from SNF Per her 2 daughters, pt has dementia. At baseline, she sometimes can recognize some family members, but is not oriented to time and place   Assessment & Plan:     Bradycardia/Third and second degree heart block -etiology unclear, could have conduction system disease, but also on verapamil at baseline which was stopped -TSH unremarkable -ECHO 10/18 with preserved EF and no significant valvular disease -EDP and Admitting MD d/w Cardiology, not felt to be a candidate for Pacemaker due to advanced age, dementia, bedbound state at SNF with hemiplegia, Palliative care recommended, Palliative consulted for Goals of care  -was able to contact her daughter and recommended DC back to [facility] with Hospice care  Dementia -likely vascular dementia, following CVA 4 years ago  Chronic anemia -hemoglobin is stable  Chronic diastolic CHF: - 2D echo on 09/10/2017 showed EF 60-65%. BNP 370 -home dose of Lasix continued  Depression and anxiety:  -Continue home medications  Stroke [facility]): -With residual left hemiplegia -Continue aspirin  Essential hypertension: -hold verapamil -IV hydralazine as needed. -Patient is on Lasix, hydralazine  GERD: -Pepcid  DVT ppx: SQ Lovenox Code Status: DO NOT RESUSCITATE Family Communication:  no family at bedside, attempting to call daughters again this afternoon Disposition Plan:  back to SNF with Hospice   Consultants:   Palliative medicine  D/w Cardiology   Procedures:   Antimicrobials:    Subjective: -Remains bradycardic and confused, no chest pain or shortness of breath, no dizziness  reported  Objective: Vitals:   08/08/18 2145 08/08/18 2230 08/08/18 2301 08/09/18 0608  BP: (!) 160/48 (!) 154/52 (!) 169/69 (!) 157/53  Pulse: (!) 36 (!) 33 (!) 49 (!) 40  Resp: 13 14 12 13   Temp:   98.6 F (37 C) (!) 97.4 F (36.3 C)  TempSrc:   Oral Oral  SpO2: 97%  97% 100%  Weight:   59.6 kg   Height:   5\' 6"  (1.676 m)     Intake/Output Summary (Last 24 hours) at 08/09/2018 1204 Last data filed at 08/08/2018 2241 Gross per 24 hour  Intake -  Output 200 ml  Net -200 ml   Filed Weights   08/08/18 2301  Weight: 59.6 kg    Examination:  General exam: thinly built African-American female, confused, laying in bed Respiratory system: Clear to auscultation. Respiratory effort normal. Cardiovascular system: S1 & S2 heard, bradycardic  Gastrointestinal system: Abdomen is nondistended, soft and nontender.Normal bowel sounds heard. Central nervous system: Awake alert, confused, left chronic hip hemiplegia Extremities: no edema Skin: No rashes, lesions or ulcers Psychiatry: unable to assess, confused    Data Reviewed:   CBC: Recent Labs  Lab 08/08/18 1822 08/09/18 0328  WBC 6.1 6.3  NEUTROABS 2.8  --   HGB 10.8* 10.2*  HCT 32.4* 31.6*  MCV 98.8 97.2  PLT 293 237   Basic Metabolic Panel: Recent Labs  Lab 08/08/18 1822 08/09/18 0328  NA 140 140  K 4.0 3.8  CL 105 106  CO2 23 23  GLUCOSE 97 94  BUN 20 18  CREATININE 1.18* 1.06*  CALCIUM 9.0 9.2  MG 2.5*  --   PHOS 4.0  --    GFR: Estimated  Creatinine Clearance: 35.7 mL/min (A) (by C-G formula based on SCr of 1.06 mg/dL (H)). Liver Function Tests: Recent Labs  Lab 08/08/18 1822  AST 16  ALT 13  ALKPHOS 79  BILITOT 0.9  PROT 7.8  ALBUMIN 3.5   No results for input(s): LIPASE, AMYLASE in the last 168 hours. No results for input(s): AMMONIA in the last 168 hours. Coagulation Profile: No results for input(s): INR, PROTIME in the last 168 hours. Cardiac Enzymes: No results for input(s): CKTOTAL,  CKMB, CKMBINDEX, TROPONINI in the last 168 hours. BNP (last 3 results) No results for input(s): PROBNP in the last 8760 hours. HbA1C: No results for input(s): HGBA1C in the last 72 hours. CBG: No results for input(s): GLUCAP in the last 168 hours. Lipid Profile: No results for input(s): CHOL, HDL, LDLCALC, TRIG, CHOLHDL, LDLDIRECT in the last 72 hours. Thyroid Function Tests: Recent Labs    08/09/18 0328  TSH 0.909  FREET4 1.01   Anemia Panel: No results for input(s): VITAMINB12, FOLATE, FERRITIN, TIBC, IRON, RETICCTPCT in the last 72 hours. Urine analysis:    Component Value Date/Time   COLORURINE YELLOW 02/11/2016 1409   APPEARANCEUR CLEAR 02/11/2016 1409   LABSPEC 1.013 02/11/2016 1409   PHURINE 6.0 02/11/2016 1409   GLUCOSEU NEGATIVE 02/11/2016 1409   HGBUR NEGATIVE 02/11/2016 1409   BILIRUBINUR NEGATIVE 02/11/2016 1409   KETONESUR NEGATIVE 02/11/2016 1409   PROTEINUR NEGATIVE 02/11/2016 1409   UROBILINOGEN 1.0 07/23/2015 1809   NITRITE NEGATIVE 02/11/2016 1409   LEUKOCYTESUR NEGATIVE 02/11/2016 1409   Sepsis Labs: @LABRCNTIP (procalcitonin:4,lacticidven:4)  ) Recent Results (from the past 240 hour(s))  MRSA PCR Screening     Status: Abnormal   Collection Time: 08/08/18 11:07 PM  Result Value Ref Range Status   MRSA by PCR POSITIVE (A) NEGATIVE Final    Comment:        The GeneXpert MRSA Assay (FDA approved for NASAL specimens only), is one component of a comprehensive MRSA colonization surveillance program. It is not intended to diagnose MRSA infection nor to guide or monitor treatment for MRSA infections. RESULT CALLED TO, READ BACK BY AND VERIFIED WITHEtheleen Mayhew RN [id number] 08/09/18 A BROWNING Performed at [facility], [address], [place], [place] [address]          Radiology Studies: Dg Chest Port 1 View  Result Date: 08/08/2018 CLINICAL DATA:  Bradycardia. EXAM: PORTABLE CHEST 1 VIEW COMPARISON:  02/14/2016. FINDINGS: The cardiac  silhouette remains mildly enlarged. The interstitial markings remain mildly prominent. Decreased prominence of the pulmonary vasculature. No pleural fluid or airspace consolidation. Diffuse osteopenia. Bilateral shoulder degenerative changes and superior migration of the humeral head compatible with chronic bilateral rotator cuff tears. IMPRESSION: 1. No acute abnormality. 2. Stable mild cardiomegaly, mild pulmonary vascular congestion and mild chronic interstitial lung disease with no definite interstitial pulmonary edema. Electronically Signed   By: Beckie Salts M.D.   On: 08/08/2018 18:47        Scheduled Meds: . aspirin  325 mg Oral Daily  . citalopram  10 mg Oral Daily  . clopidogrel  75 mg Oral Daily  . enoxaparin (LOVENOX) injection  40 mg Subcutaneous Q24H  . famotidine  20 mg Oral Q12H  . furosemide  40 mg Oral Daily  . hydrALAZINE  75 mg Oral Q8H  . LORazepam  0.5 mg Oral Q12H  . Melatonin  4.5 mg Oral QHS  . multivitamin with minerals  1 tablet Oral Daily  . MUSCLE RUB  Topical BID  . polyvinyl alcohol  1 drop Both Eyes 4 times per day   Continuous Infusions:   LOS: 0 days    Time spent:    Zannie Cove, MD [facility] Page via [email or web address], password [email or web address] After 7PM please contact night-coverage  08/09/2018, 12:04 PM

## 2018-08-10 DIAGNOSIS — Z7189 Other specified counseling: Secondary | ICD-10-CM

## 2018-08-10 DIAGNOSIS — R001 Bradycardia, unspecified: Secondary | ICD-10-CM | POA: Diagnosis not present

## 2018-08-10 DIAGNOSIS — I442 Atrioventricular block, complete: Secondary | ICD-10-CM | POA: Diagnosis not present

## 2018-08-10 DIAGNOSIS — Z515 Encounter for palliative care: Secondary | ICD-10-CM

## 2018-08-10 DIAGNOSIS — I5032 Chronic diastolic (congestive) heart failure: Secondary | ICD-10-CM | POA: Diagnosis not present

## 2018-08-10 MED ORDER — MORPHINE SULFATE (CONCENTRATE) 10 MG/0.5ML PO SOLN: 2.5000 mg | ORAL | Status: DC | PRN

## 2018-08-10 NOTE — Progress Notes (Signed)
PROGRESS NOTE    Monica Blankenship  DXA:[id number] DOB: [birth year] DOA: 08/08/2018 PCP: Georgann Housekeeper, MD  Brief Narrative: Monica Blankenship is a 84 y.o. female with medical history significant of dementia, hypertension, asthma, stroke with left-sided weakness, bed bound, GERD, depression with anxiety, dCHF, who presented with bradycardia from SNF Per her 2 daughters, pt has dementia. At baseline, she sometimes can recognize some family members, but is not oriented to time and place   Assessment & Plan:     Bradycardia/Third and second degree heart block -etiology unclear, could have conduction system disease, but also on verapamil at baseline which was stopped -TSH unremarkable -ECHO 10/18 with preserved EF and no significant valvular disease -EDP and Admitting MD d/w Cardiology, not felt to be a candidate for Pacemaker due to advanced age, dementia, bedbound state at SNF with hemiplegia, Palliative care recommended, Palliative consulted for Goals of care -appreciate palliative input, plan for comfort-based care and discharge to residential hospice if possible -social work consulted  Dementia -likely vascular dementia, following CVA 4 years ago  Chronic anemia -hemoglobin is stable  Chronic diastolic CHF: - 2D echo on 09/10/2017 showed EF 60-65%. BNP 370 -Lasix stopped, she is euvolemic  Depression and anxiety:  -Continue home medications  Stroke [facility]): -With residual left hemiplegia  Essential hypertension: -hold verapamil  GERD: -Pepcid  DVT ppx: SQ Lovenox Code Status: DO NOT RESUSCITATE Family Communication:  Called and discussed with daughter Monica Blankenship Disposition Plan:  residential hospice versus SNF with hospice   Consultants:   Palliative medicine  D/w Cardiology   Procedures:   Antimicrobials:    Subjective: -Remains bradycardic and confused, no chest pain or shortness of breath, no dizziness reported  Objective: Vitals:   08/09/18 1608  08/09/18 1945 08/10/18 0614 08/10/18 0800  BP: (!) 154/49 (!) 144/51 (!) 146/56 135/62  Pulse: (!) 39 (!) 39 (!) 34 (!) 40  Resp: 16 16 18    Temp:  98.8 F (37.1 C) 98.4 F (36.9 C) 98.6 F (37 C)  TempSrc:  Oral Axillary Oral  SpO2: 99% 100% 98% 98%  Weight:      Height:        Intake/Output Summary (Last 24 hours) at 08/10/2018 1021 Last data filed at 08/09/2018 1300 Gross per 24 hour  Intake -  Output 550 ml  Net -550 ml   Filed Weights   08/08/18 2301  Weight: 59.6 kg    Examination:  Gen: Awake, Alert, thinly built, pleasantly confused HEENT: PERRLA, Neck supple, no JVD Lungs: clear bilaterally CVS: S1-S2/bradycardic Abd: soft, Non tender, non distended, BS present Extremities: No edema Skin: no new rashes Psychiatry: unable to assess, confused    Data Reviewed:   CBC: Recent Labs  Lab 08/08/18 1822 08/09/18 0328  WBC 6.1 6.3  NEUTROABS 2.8  --   HGB 10.8* 10.2*  HCT 32.4* 31.6*  MCV 98.8 97.2  PLT 293 237   Basic Metabolic Panel: Recent Labs  Lab 08/08/18 1822 08/09/18 0328  NA 140 140  K 4.0 3.8  CL 105 106  CO2 23 23  GLUCOSE 97 94  BUN 20 18  CREATININE 1.18* 1.06*  CALCIUM 9.0 9.2  MG 2.5*  --   PHOS 4.0  --    GFR: Estimated Creatinine Clearance: 35.7 mL/min (A) (by C-G formula based on SCr of 1.06 mg/dL (H)). Liver Function Tests: Recent Labs  Lab 08/08/18 1822  AST 16  ALT 13  ALKPHOS 79  BILITOT 0.9  PROT 7.8  ALBUMIN 3.5   No results for input(s): LIPASE, AMYLASE in the last 168 hours. No results for input(s): AMMONIA in the last 168 hours. Coagulation Profile: No results for input(s): INR, PROTIME in the last 168 hours. Cardiac Enzymes: No results for input(s): CKTOTAL, CKMB, CKMBINDEX, TROPONINI in the last 168 hours. BNP (last 3 results) No results for input(s): PROBNP in the last 8760 hours. HbA1C: No results for input(s): HGBA1C in the last 72 hours. CBG: No results for input(s): GLUCAP in the last 168  hours. Lipid Profile: No results for input(s): CHOL, HDL, LDLCALC, TRIG, CHOLHDL, LDLDIRECT in the last 72 hours. Thyroid Function Tests: Recent Labs    08/09/18 0328  TSH 0.909  FREET4 1.01   Anemia Panel: No results for input(s): VITAMINB12, FOLATE, FERRITIN, TIBC, IRON, RETICCTPCT in the last 72 hours. Urine analysis:    Component Value Date/Time   COLORURINE YELLOW 02/11/2016 1409   APPEARANCEUR CLEAR 02/11/2016 1409   LABSPEC 1.013 02/11/2016 1409   PHURINE 6.0 02/11/2016 1409   GLUCOSEU NEGATIVE 02/11/2016 1409   HGBUR NEGATIVE 02/11/2016 1409   BILIRUBINUR NEGATIVE 02/11/2016 1409   KETONESUR NEGATIVE 02/11/2016 1409   PROTEINUR NEGATIVE 02/11/2016 1409   UROBILINOGEN 1.0 07/23/2015 1809   NITRITE NEGATIVE 02/11/2016 1409   LEUKOCYTESUR NEGATIVE 02/11/2016 1409   Sepsis Labs: @LABRCNTIP (procalcitonin:4,lacticidven:4)  ) Recent Results (from the past 240 hour(s))  MRSA PCR Screening     Status: Abnormal   Collection Time: 08/08/18 11:07 PM  Result Value Ref Range Status   MRSA by PCR POSITIVE (A) NEGATIVE Final    Comment:        The GeneXpert MRSA Assay (FDA approved for NASAL specimens only), is one component of a comprehensive MRSA colonization surveillance program. It is not intended to diagnose MRSA infection nor to guide or monitor treatment for MRSA infections. RESULT CALLED TO, READ BACK BY AND VERIFIED WITH11/06/19 RN Etheleen Mayhew 08/09/18 A BROWNING Performed at [facility], [address], [place], [place]          Radiology Studies: Dg Chest Port 1 View  Result Date: 08/08/2018 CLINICAL DATA:  Bradycardia. EXAM: PORTABLE CHEST 1 VIEW COMPARISON:  02/14/2016. FINDINGS: The cardiac silhouette remains mildly enlarged. The interstitial markings remain mildly prominent. Decreased prominence of the pulmonary vasculature. No pleural fluid or airspace consolidation. Diffuse osteopenia. Bilateral shoulder degenerative changes and superior  migration of the humeral head compatible with chronic bilateral rotator cuff tears. IMPRESSION: 1. No acute abnormality. 2. Stable mild cardiomegaly, mild pulmonary vascular congestion and mild chronic interstitial lung disease with no definite interstitial pulmonary edema. Electronically Signed   By: 02/16/2016 M.D.   On: 08/08/2018 18:47        Scheduled Meds: . Melatonin  4.5 mg Oral QHS  . MUSCLE RUB   Topical BID  . polyvinyl alcohol  1 drop Both Eyes 4 times per day   Continuous Infusions:   LOS: 0 days    Time spent: 10/08/2018    , MD [facility] Page via [email or web address], password [email or web address] After 7PM please contact night-coverage  08/10/2018, 10:21 AM

## 2018-08-10 NOTE — Consult Note (Signed)
[facility]: New referral reviewed with Hospice MD. Pt is asymptomatic at this time. No injectable medications needed to achieve comfort at present. Her heart rate is 35 at time of RN visit.GIP vs Residential levels of care reviewed with pt daughter. Pt not found to meet GIP level of care at this time. No residential beds to offer. Plan to follow up tomorrow to assess status as she will likely become symptomatic at any time with high risk for acute change. Thank you for the opportunity to assist this family. Ronette Deter, RN [facility]

## 2018-08-10 NOTE — Consult Note (Signed)
Consultation Note Date: 08/10/2018   Patient Name: Monica Blankenship  DOB: 1929-03-10  MRN: [id number]  Age / Sex: 84 y.o., female  PCP: Georgann Housekeeper, MD Referring Physician: Zannie Cove, MD  Reason for Consultation: Establishing goals of care, Inpatient hospice referral, Non pain symptom management and Psychosocial/spiritual support  HPI/Patient Profile: 84 y.o. female  with past medical history of end-stage dementia, CVA, diastolic heart failure, chronic kidney disease stage III, chronic urinary retention,, mitral valve stenosis,  aortic valve stenosis admitted on 08/08/2018 from skilled nursing facility with bradycardia, HR 30-40/min.  She has been seen by cardiology and due to multiple comorbidities, multisystem disease, she is not a candidate for a pacemaker .   Patient has resided in a skilled nursing facility for a number of years.  She has been seen by the palliative medicine division in 2016 and has also been under the care of hospice in the past.  She is currently followed by [facility], community-based palliative medicine service through [facility].  Consult ordered for hospice assistance, goals of care.   Clinical Assessment and Goals of Care: Patient seen, chart reviewed.  Monica, Monica Blankenship, who is healthcare POA, is at the bedside.  Monica Blankenship is hallucinating, seeing deceased family members.  Monica reports seeing a decline beginning approximately 1 month ago which  includes decreased appetite, weight loss (undefined amount), hallucinating seeing deceased family members, inability to take oral medications on a regular basis.  At baseline, she is dependent for all ADLs but until recently had been eating.  She is only eating bites and sips intermittently .  Her Monica visits her daily.  Monica Blankenship is tearful and believes her mother is nearing end of life  Monica Blankenship, patient's Monica, is her healthcare POA.  She can be reached at [phone].  Monica Blankenship is married but her husband is also in a facility with dementia.  She has other surviving children.    SUMMARY OF RECOMMENDATIONS   Confirmed DNR/DNI No artificial feeding Allow patient to eat for comfort Patient transitioning towards end of life.  Family requesting residential hospice.  Offered choice per Medicare guidelines and family has selected [facility].  Have notified [facility] liaison of referral.  Consult placed to social work as well Code Status/Advance Care Planning:  DNR    Symptom Management:   Shortness of breath: We will make low-dose morphine concentrate available, 2.5 mg every 3 hours as needed  Palliative Prophylaxis:   Aspiration, Bowel Regimen, Delirium Protocol, Eye Care, Frequent Pain Assessment, Oral Care and Turn Reposition  Additional Recommendations (Limitations, Scope, Preferences):  Full Comfort Care  Psycho-social/Spiritual:   Desire for further Chaplaincy support:no  Additional Recommendations: Referral to Community Resources   Prognosis:   < 2 weeks in the setting of advanced dementia, multisystem disease including diastolic heart failure chronic kidney disease stage III mitral valve stenosis, aortic valve stenosis now with new bradycardia heart rate 30s to 40s; patient  is not a candidate for pacemaker.  Patient is at high risk for acute cardiopulmonary failure  Discharge Planning: Hospice facility      Primary Diagnoses: Present on Admission: . Bradycardia . Complete heart block (HCC) . Anemia . Anxiety . Chronic diastolic CHF (congestive heart failure) (HCC) . Essential hypertension . Stroke (HCC) . Depression . GERD (gastroesophageal reflux disease)   I have reviewed the medical record, interviewed the patient and family, and examined the patient. The following  aspects are pertinent.  Past Medical History:  Diagnosis Date  . Allergy   . Anemia   . Anxiety   . Aortic regurgitation    mild to moderate by echo 4/14  . Aortic stenosis    mild by echo 4/14  . CKD (chronic kidney disease)   . Diastolic dysfunction   . HTN (hypertension)   . Insomnia   . Mitral stenosis    moderate MS on echo 2016  . Obesity   . Osteoarthritis   . Stroke (HCC)   . Vascular dementia    Social History   Socioeconomic History  . Marital status: Married    Spouse name: Not on file  . Number of children: Not on file  . Years of education: Not on file  . Highest education level: Not on file  Occupational History  . Occupation: retired  Engineer, production  . Financial resource strain: Not on file  . Food insecurity:    Worry: Not on file    Inability: Not on file  . Transportation needs:    Medical: Not on file    Non-medical: Not on file  Tobacco Use  . Smoking status: Former Smoker    Packs/day: 0.25    Years: 1.00    Pack years: 0.25  . Smokeless tobacco: Never Used  . Tobacco comment: smoked 1-3 cigs/day in school for about a year some days only  Substance and Sexual Activity  . Alcohol use: No  . Drug use: No  . Sexual activity: Not on file  Lifestyle  . Physical activity:    Days per week: Not on file    Minutes per session: Not on file  . Stress: Not on file  Relationships  . Social connections:    Talks on phone: Not on file    Gets together: Not on file    Attends religious service: Not on file    Active member of club or organization: Not on file    Attends meetings of clubs or organizations: Not on file    Relationship status: Not on file  Other Topics Concern  . Not on file  Social History Narrative  . Not on file   Family History  Problem Relation Age of Onset  . Hypertension Mother   . CVA Mother    Scheduled Meds: . aspirin  325 mg Oral Daily  . citalopram  10 mg Oral Daily  . clopidogrel  75 mg Oral Daily  .  enoxaparin (LOVENOX) injection  40 mg Subcutaneous Q24H  . famotidine  20 mg Oral Q12H  . furosemide  40 mg Oral Daily  . hydrALAZINE  75 mg Oral Q8H  . Melatonin  4.5 mg Oral QHS  . multivitamin with minerals  1 tablet Oral Daily  . MUSCLE RUB   Topical BID  . polyvinyl alcohol  1 drop Both Eyes 4 times per day   Continuous Infusions: PRN Meds:.acetaminophen **OR** acetaminophen, albuterol, hydrALAZINE, LORazepam, ondansetron **OR** ondansetron (ZOFRAN) IV, zolpidem Medications Prior  to Admission:  Prior to Admission medications   Medication Sig Start Date End Date Taking? Authorizing Provider  aspirin EC 325 MG EC tablet Take 1 tablet (325 mg total) by mouth daily. 02/16/16  Yes Rai, Ripudeep K, MD  citalopram (CELEXA) 20 MG tablet Take 10 mg by mouth daily.    Yes [provider]  clopidogrel (PLAVIX) 75 MG tablet Take 1 tablet (75 mg total) by mouth daily. 11/17/14  Yes Rhetta Mura, MD  famotidine (PEPCID) 20 MG tablet Take 20 mg by mouth every 12 (twelve) hours.   Yes [provider]  furosemide (LASIX) 40 MG tablet Take 1 tablet (40 mg total) by mouth daily. 02/16/16  Yes Rai, Ripudeep K, MD  hydrALAZINE (APRESOLINE) 25 MG tablet Take 75 mg by mouth See admin instructions. Take 3 tablets (75 mg) by mouth every 8 hours, hold for SBP <105   Yes [provider]  LORazepam (ATIVAN) 0.5 MG tablet Take 1 tablet (0.5 mg total) by mouth every 8 (eight) hours as needed for anxiety. Patient taking differently: Take 0.5 mg by mouth every 12 (twelve) hours.  02/16/16  Yes Rai, Ripudeep K, MD  Melatonin 5 MG TABS Take 5 mg by mouth at bedtime.   Yes [provider]  Menthol, Topical Analgesic, (BIOFREEZE EX) Apply 1 application topically 2 (two) times daily. Apply to left shoulder   Yes [provider]  Multiple Vitamin (MULTIVITAMIN WITH MINERALS) TABS tablet Take 1 tablet by mouth daily.   Yes [provider]  Polyethyl Glycol-Propyl  Glycol (SYSTANE) 0.4-0.3 % SOLN Place 1 drop into both eyes 3 (three) times daily. 9am, 1pm, 5pm   Yes [provider]  polyethylene glycol (MIRALAX / GLYCOLAX) packet Take 17 g by mouth daily. Mix in 4-8 oz fluid and drink   Yes [provider]  Polyvinyl Alcohol-Povidone PF (REFRESH) 1.4-0.6 % SOLN Place 1 drop into both eyes at bedtime.   Yes [provider]  potassium chloride SA (K-DUR,KLOR-CON) 20 MEQ tablet Take 20 mEq by mouth daily.   Yes [provider]  verapamil (CALAN-SR) 120 MG CR tablet Take 120 mg by mouth daily.   Yes [provider]  feeding supplement, ENSURE ENLIVE, (ENSURE ENLIVE) LIQD Take 237 mLs by mouth 2 (two) times daily between meals. Patient not taking: Reported on 08/08/2018 02/16/16   Rai, Delene Ruffini, MD  ipratropium-albuterol (DUONEB) 0.5-2.5 (3) MG/3ML SOLN Take 3 mLs by nebulization 2 (two) times daily. Patient not taking: Reported on 08/08/2018 02/16/16   Cathren Harsh, MD   Allergies  Allergen Reactions  . Fentanyl Other (See Comments)    confusion   Review of Systems  Unable to perform ROS: Dementia    Physical Exam  Constitutional:  Cachectic, frail elderly female; hallucinating, nearing death awareness  HENT:  Head: Normocephalic and atraumatic.  Neck: Normal range of motion.  Cardiovascular:  Bradycardic, heart rate 30 to 40/min  Pulmonary/Chest:  Mild increased work of breathing at rest  Neurological: She is alert.  Oriented only to herself Peers to recognize her Monica  Skin: Skin is warm and dry.  Psychiatric:  Patient is confused Likely experiencing nearing death awareness, hallucinating and speaking to deceased family members  Nursing note and vitals reviewed.   Vital Signs: BP 135/62 (BP Location: Left Arm)   Pulse (!) 40   Temp 98.6 F (37 C) (Oral)   Resp 18   Ht 5\' 6"  (1.676 m)   Wt 59.6 kg  SpO2 98%   BMI 21.21 kg/m  Pain Scale: 0-10   Pain Score: 0-No pain   SpO2: SpO2:  98 % O2 Device:SpO2: 98 % O2 Flow Rate: .   IO: Intake/output summary:   Intake/Output Summary (Last 24 hours) at 08/10/2018 3532 Last data filed at 08/09/2018 1300 Gross per 24 hour  Intake -  Output 550 ml  Net -550 ml    LBM: Last BM Date: (pt unable to recall) Baseline Weight: Weight: 59.6 kg Most recent weight: Weight: 59.6 kg     Palliative Assessment/Data:   Flowsheet Rows     Most Recent Value  Intake Tab  Referral Department  Hospitalist  Unit at Time of Referral  Med/Surg Unit  Palliative Care Primary Diagnosis  Cardiac  Date Notified  08/09/18  Palliative Care Type  Return patient Palliative Care  Reason for referral  Clarify Goals of Care, Counsel Regarding Hospice, Psychosocial or Spiritual support, Non-pain Symptom  Date of Admission  08/09/18  Date first seen by Palliative Care  08/10/18  # of days Palliative referral response time  1 Day(s)  # of days IP prior to Palliative referral  0  Clinical Assessment  Palliative Performance Scale Score  30%  Pain Max last 24 hours  Not able to report  Pain Min Last 24 hours  Not able to report  Dyspnea Max Last 24 Hours  Not able to report  Dyspnea Min Last 24 hours  Not able to report  Nausea Max Last 24 Hours  Not able to report  Nausea Min Last 24 Hours  Not able to report  Anxiety Max Last 24 Hours  Not able to report  Anxiety Min Last 24 Hours  Not able to report  Other Max Last 24 Hours  Not able to report  Psychosocial & Spiritual Assessment  Palliative Care Outcomes  Patient/Family meeting held?  Yes  Who was at the meeting?  dtr, Monica Blankenship  Palliative Care Outcomes  Counseled regarding hospice, Transitioned to hospice, Provided psychosocial or spiritual support, Clarified goals of care  Patient/Family wishes: Interventions discontinued/not started   Mechanical Ventilation, BiPAP, Hemodialysis, Transfusion, Tube feedings/TPN, PEG, Trach, NIPPV, Vasopressors  Palliative Care follow-up planned  Yes,  Facility      Time In: 0800 Time Out: 0910 Time Total: 70 min Greater than 50%  of this time was spent counseling and coordinating care related to the above assessment and plan. Staffed with Dr. Jomarie Longs  Signed by: Irean Hong, NP   Please contact Palliative Medicine Team phone at [phone] for questions and concerns.  For individual provider: See Loretha Stapler

## 2018-08-11 DIAGNOSIS — D599 Acquired hemolytic anemia, unspecified: Secondary | ICD-10-CM | POA: Diagnosis not present

## 2018-08-11 DIAGNOSIS — F419 Anxiety disorder, unspecified: Secondary | ICD-10-CM | POA: Diagnosis not present

## 2018-08-11 DIAGNOSIS — R001 Bradycardia, unspecified: Secondary | ICD-10-CM | POA: Diagnosis not present

## 2018-08-11 DIAGNOSIS — F0390 Unspecified dementia without behavioral disturbance: Secondary | ICD-10-CM | POA: Diagnosis not present

## 2018-08-11 LAB — T3, FREE: T3, Free: 2.3 pg/mL (ref 2.0–4.4)

## 2018-08-11 MED ORDER — MORPHINE SULFATE (CONCENTRATE) 10 MG/0.5ML PO SOLN: 2.5000 mg | ORAL | 0 refills | Status: AC | PRN

## 2018-08-11 MED ORDER — ACETAMINOPHEN 325 MG PO TABS: 650.0000 mg | ORAL_TABLET | Freq: Four times a day (QID) | ORAL | Status: AC | PRN

## 2018-08-11 MED ORDER — MUPIROCIN 2 % EX OINT: TOPICAL_OINTMENT | Freq: Two times a day (BID) | CUTANEOUS | Status: DC

## 2018-08-11 NOTE — Care Management Note (Signed)
Case Management Note  Patient Details  Name: Monica Blankenship MRN: [id number] Date of Birth: July 26, 1929  Subjective/Objective: Patient Presented for bradycardia. Plan will be to [facility] to [facility]. CSW is following for disposition needs. CM did speak with daughter Monica Blankenship via phone to go over the [employer].                   Action/Plan: No further needs from CM at this time.   Expected Discharge Date:                  Expected Discharge Plan:  Skilled Nursing Facility  In-House Referral:  Clinical Social Work  Discharge planning Services  CM Consult  Post Acute Care Choice:  NA Choice offered to:  NA  DME Arranged:  N/A DME Agency:  NA  HH Arranged:  NA HH Agency:  NA  Status of Service:  Completed, signed off  If discussed at Long Length of Stay Meetings, dates discussed:    Additional Comments:  Gala Lewandowsky, RN 08/11/2018, 12:30 PM

## 2018-08-11 NOTE — Consult Note (Signed)
[facility]--Visit to bedside to re-evaluate pt for [facility] per daughter--Chris Shelton's request.  Pt is peaceful/comfortable.  Taking oral meds.  Able to eat approx 50% of her meal today when fed by hospital staff.  Her current care/medication needs do not require inpatient Hospice admission.  Call to Chris Shelton [phone] to explain that pt's comfort care needs can satisfactorily be met back at [facility] where pt has been a resident for 3 yrs.  Pt has been approved for Hospice to follow pt back at [facility].  Mrs. Shelton is ready for her mother to d/c back to [facility] as soon as she is medically stable for d/c.  Tentative plan made with Mrs. Shelton for [facility] admissions RN to meet her at [facility] tomorrow to enroll pt in Hospice services.   Updated d/c planner--Susan and staff RN-Kelley of this plan. Thank you for the referral and the opportunity to serve pt and family during the difficult time.  JoAnn N. Scott, RN  °

## 2018-08-11 NOTE — Plan of Care (Signed)
  Problem: Nutrition: Goal: Adequate nutrition will be maintained Outcome: Progressing   

## 2018-08-11 NOTE — Discharge Summary (Addendum)
Physician Discharge Summary  Monica Blankenship:[id number] DOB: 1929-11-23 DOA: 08/08/2018  PCP: Georgann Housekeeper, MD  Admit date: 08/08/2018 Discharge date: 08/11/2018  Time spent: 35 minutes  Recommendations for Outpatient Follow-up:  1. Discharge back to SNF with [facility] for comfort focused care   Discharge Diagnoses:    Third-degree AV block   Second-degree type II AV block   Bradycardia   Dementia   History of CVA with hemiplegia   Bedbound status   Moderate protein calorie malnutrition   Anemia   Anxiety   Chronic diastolic CHF (congestive heart failure) (HCC)   Stroke (HCC)   Essential hypertension   Goals of care, counseling/discussion   Complete heart block (HCC)   Depression   GERD (gastroesophageal reflux disease)   Palliative care by specialist   Discharge Condition: guarded  Diet recommendation: comfort feeds  Filed Weights   08/08/18 2301 08/11/18 0521  Weight: 59.6 kg 61.6 kg    History of present illness:  Cheynne L McMurrayis a 84 y.o.femalewith medical history significant ofdementia, hypertension, asthma, stroke with left-sided weakness, bed bound, GERD, depression with anxiety, chronic diastolic CHF, who presented with bradycardia from SNF Per her 2 daughters, pt has dementia. At baseline, shesometimes can recognize some family members, but is not oriented to time and place  Hospital Course:   Bradycardia/Third and second degree heart block -suspect conduction system disease, but also on verapamil at baseline which was stopped on admission -TSH unremarkable -ECHO 10/18 with preserved EF and no significant valvular disease -ED physician and Admitting MD d/w Cardiology, not felt to be a candidate for Pacemaker due to advanced age, dementia, bedbound state at SNF with hemiplegia, Palliative care recommended, -Palliative medicine was consulted for Goals of care -appreciate palliative input, plan for comfort-based care and discharge to SNF  with hospice for comfort based end of life care  Dementia -likely vascular dementia, following CVA 4 years ago  Anxiety -continue Lorazepam TID PRN per SNF regimen  Chronic anemia -hemoglobin is stable  Chronic diastolic CHF: -2D echo on 09/10/2017 showed EF 60-65%.BNP 370 -Lasix stopped, she is euvolemic  Depression and anxiety: -stable  Stroke [facility]): -With residual left hemiplegia  Essential hypertension: -stopped verapamil  Code Status: DO NOT RESUSCITATE  Consultations:  Palliative medicine  Discharge Exam: Vitals:   08/11/18 0521 08/11/18 0800  BP: (!) 164/44   Pulse: (!) 41 (!) 37  Resp: 16 13  Temp: 98.9 F (37.2 C)   SpO2: 98% 100%    General: pleasantly confused Cardiovascular: S1-S2/bradycardic Respiratory: clear bilaterally  Discharge Instructions   Discharge Instructions    Discharge instructions   Complete by:  As directed    Comfort Feeds   Increase activity slowly   Complete by:  As directed      Allergies as of 08/11/2018      Reactions   Fentanyl Other (See Comments)   confusion      Medication List    STOP taking these medications   aspirin 325 MG EC tablet   BIOFREEZE EX   clopidogrel 75 MG tablet Commonly known as:  PLAVIX   feeding supplement (ENSURE ENLIVE) Liqd   furosemide 40 MG tablet Commonly known as:  LASIX   hydrALAZINE 25 MG tablet Commonly known as:  APRESOLINE   ipratropium-albuterol 0.5-2.5 (3) MG/3ML Soln Commonly known as:  DUONEB   multivitamin with minerals Tabs tablet   potassium chloride SA 20 MEQ tablet Commonly known as:  K-DUR,KLOR-CON   REFRESH 1.4-0.6 %  Soln Generic drug:  Polyvinyl Alcohol-Povidone PF   verapamil 120 MG CR tablet Commonly known as:  CALAN-SR     TAKE these medications   acetaminophen 325 MG tablet Commonly known as:  TYLENOL Take 2 tablets (650 mg total) by mouth every 6 (six) hours as needed for mild pain (or Fever >/= 101).   citalopram 20 MG  tablet Commonly known as:  CELEXA Take 10 mg by mouth daily.   LORazepam 0.5 MG tablet Commonly known as:  ATIVAN Take 1 tablet (0.5 mg total) by mouth every 8 (eight) hours as needed for anxiety. What changed:  when to take this   Melatonin 5 MG Tabs Take 5 mg by mouth at bedtime.   morphine CONCENTRATE 10 MG/0.5ML Soln concentrated solution Take 0.13 mLs (2.6 mg total) by mouth every 4 (four) hours as needed for severe pain (dyspnea).   PEPCID 20 MG tablet Generic drug:  famotidine Take 20 mg by mouth every 12 (twelve) hours.   polyethylene glycol packet Commonly known as:  MIRALAX / GLYCOLAX Take 17 g by mouth daily. Mix in 4-8 oz fluid and drink   SYSTANE 0.4-0.3 % Soln Generic drug:  Polyethyl Glycol-Propyl Glycol Place 1 drop into both eyes 3 (three) times daily. 9am, 1pm, 5pm      Allergies  Allergen Reactions  . Fentanyl Other (See Comments)    confusion   Follow-up Information    Georgann Housekeeper, MD Follow up in 1 week(s).   Specialty:  Internal Medicine Contact information: [address] [place] [address] [phone]            The results of significant diagnostics from this hospitalization (including imaging, microbiology, ancillary and laboratory) are listed below for reference.    Significant Diagnostic Studies: Dg Chest Port 1 View  Result Date: 08/08/2018 CLINICAL DATA:  Bradycardia. EXAM: PORTABLE CHEST 1 VIEW COMPARISON:  02/14/2016. FINDINGS: The cardiac silhouette remains mildly enlarged. The interstitial markings remain mildly prominent. Decreased prominence of the pulmonary vasculature. No pleural fluid or airspace consolidation. Diffuse osteopenia. Bilateral shoulder degenerative changes and superior migration of the humeral head compatible with chronic bilateral rotator cuff tears. IMPRESSION: 1. No acute abnormality. 2. Stable mild cardiomegaly, mild pulmonary vascular congestion and mild chronic interstitial lung  disease with no definite interstitial pulmonary edema. Electronically Signed   By: Beckie Salts M.D.   On: 08/08/2018 18:47    Microbiology: Recent Results (from the past 240 hour(s))  MRSA PCR Screening     Status: Abnormal   Collection Time: 08/08/18 11:07 PM  Result Value Ref Range Status   MRSA by PCR POSITIVE (A) NEGATIVE Final    Comment:        The GeneXpert MRSA Assay (FDA approved for NASAL specimens only), is one component of a comprehensive MRSA colonization surveillance program. It is not intended to diagnose MRSA infection nor to guide or monitor treatment for MRSA infections. RESULT CALLED TO, READ BACK BY AND VERIFIED WITHEtheleen Mayhew RN [id number] 08/09/18 A BROWNING Performed at [facility], [address] [place], [place] [address]      Labs: Basic Metabolic Panel: Recent Labs  Lab 08/08/18 1822 08/09/18 0328  NA 140 140  K 4.0 3.8  CL 105 106  CO2 23 23  GLUCOSE 97 94  BUN 20 18  CREATININE 1.18* 1.06*  CALCIUM 9.0 9.2  MG 2.5*  --   PHOS 4.0  --    Liver Function Tests: Recent  Labs  Lab 08/08/18 1822  AST 16  ALT 13  ALKPHOS 79  BILITOT 0.9  PROT 7.8  ALBUMIN 3.5   No results for input(s): LIPASE, AMYLASE in the last 168 hours. No results for input(s): AMMONIA in the last 168 hours. CBC: Recent Labs  Lab 08/08/18 1822 08/09/18 0328  WBC 6.1 6.3  NEUTROABS 2.8  --   HGB 10.8* 10.2*  HCT 32.4* 31.6*  MCV 98.8 97.2  PLT 293 237   Cardiac Enzymes: No results for input(s): CKTOTAL, CKMB, CKMBINDEX, TROPONINI in the last 168 hours. BNP: BNP (last 3 results) Recent Labs    08/08/18 1822  BNP 370.5*    ProBNP (last 3 results) No results for input(s): PROBNP in the last 8760 hours.  CBG: No results for input(s): GLUCAP in the last 168 hours.     Signed:  Zannie Cove MD.  [facility] 08/11/2018, 11:14 AM  will

## 2018-08-11 NOTE — Clinical Social Work Placement (Signed)
   CLINICAL SOCIAL WORK PLACEMENT  NOTE  Date:  08/11/2018  Patient Details  Name: Monica Blankenship MRN: [id number] Date of Birth: 04-24-1929  Clinical Social Work is seeking post-discharge placement for this patient at the Skilled  Nursing Facility level of care (*CSW will initial, date and re-position this form in  chart as items are completed):  Yes   Patient/family provided with [facility] Clinical Social Work Department's list of facilities offering this level of care within the geographic area requested by the patient (or if unable, by the patient's family).  Yes   Patient/family informed of their freedom to choose among providers that offer the needed level of care, that participate in Medicare, Medicaid or managed care program needed by the patient, have an available bed and are willing to accept the patient.  Yes   Patient/family informed of [facility]'s ownership interest in [facility] and [facility], as well as of the fact that they are under no obligation to receive care at these facilities.  PASRR submitted to EDS on       PASRR number received on       Existing PASRR number confirmed on 08/11/18     FL2 transmitted to all facilities in geographic area requested by pt/family on 08/11/18     FL2 transmitted to all facilities within larger geographic area on       Patient informed that his/her managed care company has contracts with or will negotiate with certain facilities, including the following:  [facility]     Yes   Patient/family informed of bed offers received.  Patient chooses bed at [facility]     Physician recommends and patient chooses bed at      Patient to be transferred to [facility] on 08/11/18.  Patient to be transferred to facility by [facility]     Patient family notified on 08/11/18 of transfer.  Name of family member notified:  [name], daughter     PHYSICIAN Please sign FL2, Please prepare prescriptions     Additional  Comment:    _______________________________________________ Abigail Butts, LCSW 08/11/2018, 12:21 PM

## 2018-08-11 NOTE — Progress Notes (Signed)
Patient will [facility] following.  Anticipated discharge date: 08/11/18 Family notified: [name], daughter Transportation by: Sharin Mons  Nurse to call report to [phone].   CSW signing off.  Abigail Butts, LCSWA  Clinical Social Worker

## 2018-08-11 NOTE — Care Management Obs Status (Signed)
MEDICARE OBSERVATION STATUS NOTIFICATION   Patient Details  Name: Monica Blankenship MRN: [id number] Date of Birth: 04-22-1929   Medicare Observation Status Notification Given:  Yes    Gala Lewandowsky, RN 08/11/2018, 12:29 PM

## 2018-08-11 NOTE — [place] FL2 (Signed)
[place] MEDICAID FL2 LEVEL OF CARE SCREENING TOOL     IDENTIFICATION  Patient Name: Monica Blankenship Birthdate: 15-Feb-1929 Sex: female Admission Date (Current Location): 08/08/2018  [facility] and [place] Number:  Producer, television/film/video and Address:  [facility] [place], [address] [place], [place] [address]      Provider Number: [id number]  Attending Physician Name and Address:  Zannie Cove, MD  Relative Name and Phone Number:  [name], daughter, [phone]    Current Level of Care: Hospital Recommended Level of Care: Skilled Nursing Facility Prior Approval Number:    Date Approved/Denied:   PASRR Number: [id number]  Discharge Plan: SNF    Current Diagnoses: Patient Active Problem List   Diagnosis Date Noted  . Palliative care by specialist   . Depression 08/09/2018  . GERD (gastroesophageal reflux disease) 08/09/2018  . Complete heart block (HCC) 08/08/2018  . Symptomatic bradycardia   . Goals of care, counseling/discussion 03/02/2015  . Decreased appetite 03/02/2015  . Weakness 03/02/2015  . Agitation 03/02/2015  . Catheter-associated urinary tract infection (HCC) 02/28/2015  . Vascular dementia 02/28/2015  . Protein-calorie malnutrition, severe (HCC) 02/28/2015  . UTI (urinary tract infection) 02/27/2015  . Chest pain 02/26/2015  . UTI (lower urinary tract infection) 02/26/2015  . Foley catheter in place on admission 02/26/2015  . Falls 02/26/2015  . Swelling of left hand 02/26/2015  . Atypical chest pain   . Complicated UTI (urinary tract infection)   . Essential hypertension   . Disorientation   . Urinary retention 12/28/2014  . Altered mental status 12/14/2014  . Acute encephalopathy 12/14/2014  . Hypokalemia 12/14/2014  . Left-sided weakness 11/15/2014  . Slurring of speech 11/15/2014  . Stroke (HCC) 11/15/2014  . SBE (subacute bacterial endocarditis) 04/13/2014  . Delirium 04/10/2014  . Sepsis (HCC) 04/08/2014  .  Hyperkalemia 04/08/2014  . Severe sepsis(995.92) 04/08/2014  . Lactic acid acidosis 04/08/2014  . Encephalopathy acute 04/08/2014  . Complex sleep apnea syndrome 03/01/2014  . Syncope 02/08/2014  . Nocturnal hypoxemia 01/08/2014  . Bradycardia 11/17/2013  . CAP (community acquired pneumonia) 11/05/2013  . Obstructive chronic bronchitis without exacerbation (HCC) 11/05/2013  . Daytime somnolence 11/05/2013  . Pulmonary HTN (HCC) 10/21/2013  . Chronic diastolic CHF (congestive heart failure) (HCC) 10/01/2013  . Allergy   . Anemia   . Anxiety   . Insomnia   . Osteoarthritis   . Obesity   . CKD (chronic kidney disease)   . Mitral stenosis   . Aortic stenosis   . Aortic regurgitation     Orientation RESPIRATION BLADDER Height & Weight     Self  Normal Incontinent, External catheter Weight: 135 lb 14.4 oz (61.6 kg) Height:  5\' 6"  (167.6 cm)  BEHAVIORAL SYMPTOMS/MOOD NEUROLOGICAL BOWEL NUTRITION STATUS      Continent Diet  AMBULATORY STATUS COMMUNICATION OF NEEDS Skin   (baseline) Verbally Normal                       Personal Care Assistance Level of Assistance  Bathing, Feeding, Dressing(baseline)           Functional Limitations Info             SPECIAL CARE FACTORS FREQUENCY                       Contractures Contractures Info: Not present    Additional Factors Info  Code Status, Allergies, Isolation Precautions Code Status Info:  Full Allergies Info: Fentanyl     Isolation Precautions Info: contact precautions, MRSA     Current Medications (08/11/2018):  This is the current hospital active medication list Current Facility-Administered Medications  Medication Dose Route Frequency Provider Last Rate Last Dose  . acetaminophen (TYLENOL) tablet 650 mg  650 mg Oral Q6H PRN Lorretta Harp, MD       Or  . acetaminophen (TYLENOL) suppository 650 mg  650 mg Rectal Q6H PRN Lorretta Harp, MD      . albuterol (PROVENTIL) (2.5 MG/3ML) 0.083% nebulizer solution  2.5 mg  2.5 mg Nebulization Q4H PRN Lorretta Harp, MD      . hydrALAZINE (APRESOLINE) injection 5 mg  5 mg Intravenous Q2H PRN Lorretta Harp, MD      . LORazepam (ATIVAN) injection 0.5 mg  0.5 mg Intravenous Q8H PRN Zannie Cove, MD   0.5 mg at 08/09/18 1748  . Melatonin TABS 4.5 mg  4.5 mg Oral QHS Lorretta Harp, MD   4.5 mg at 08/10/18 2300  . morphine CONCENTRATE 10 MG/0.5ML oral solution 2.6 mg  2.6 mg Oral Q4H PRN Bullard, Ronaldo Miyamoto, NP      . mupirocin ointment (BACTROBAN) 2 %   Nasal BID Zannie Cove, MD      . MUSCLE RUB CREA   Topical BID Lorretta Harp, MD      . ondansetron [facility]) tablet 4 mg  4 mg Oral Q6H PRN Lorretta Harp, MD       Or  . ondansetron [facility]) injection 4 mg  4 mg Intravenous Q6H PRN Lorretta Harp, MD      . polyvinyl alcohol (LIQUIFILM TEARS) 1.4 % ophthalmic solution 1 drop  1 drop Both Eyes 4 times per day Lorretta Harp, MD   1 drop at 08/11/18 0930  . zolpidem (AMBIEN) tablet 5 mg  5 mg Oral QHS PRN Lorretta Harp, MD         Discharge Medications: Please see discharge summary for a list of discharge medications.  Relevant Imaging Results:  Relevant Lab Results:   Additional Information SSN: [id number]; will have [facility] following  Abigail Butts, LCSW

## 2018-10-03 DEATH — deceased

## 2022-01-26 NOTE — Progress Notes (Signed)
X °
